# Patient Record
Sex: Female | Born: 1955 | Race: Black or African American | Hispanic: No | Marital: Single | Smoking: Current every day smoker
Health system: Southern US, Community
[De-identification: ages and names within clinical notes are randomized; demographics above are authoritative.]

## PROBLEM LIST (undated history)

## (undated) DIAGNOSIS — F419 Anxiety disorder, unspecified: Secondary | ICD-10-CM

## (undated) DIAGNOSIS — K219 Gastro-esophageal reflux disease without esophagitis: Secondary | ICD-10-CM

## (undated) DIAGNOSIS — E785 Hyperlipidemia, unspecified: Secondary | ICD-10-CM

## (undated) DIAGNOSIS — K635 Polyp of colon: Secondary | ICD-10-CM

## (undated) DIAGNOSIS — H269 Unspecified cataract: Secondary | ICD-10-CM

## (undated) DIAGNOSIS — E119 Type 2 diabetes mellitus without complications: Secondary | ICD-10-CM

## (undated) DIAGNOSIS — I10 Essential (primary) hypertension: Secondary | ICD-10-CM

## (undated) DIAGNOSIS — F32A Depression, unspecified: Secondary | ICD-10-CM

## (undated) DIAGNOSIS — M1990 Unspecified osteoarthritis, unspecified site: Secondary | ICD-10-CM

## (undated) DIAGNOSIS — B1920 Unspecified viral hepatitis C without hepatic coma: Secondary | ICD-10-CM

## (undated) DIAGNOSIS — F329 Major depressive disorder, single episode, unspecified: Secondary | ICD-10-CM

## (undated) DIAGNOSIS — G629 Polyneuropathy, unspecified: Secondary | ICD-10-CM

## (undated) HISTORY — DX: Gastro-esophageal reflux disease without esophagitis: K21.9

## (undated) HISTORY — DX: Type 2 diabetes mellitus without complications: E11.9

## (undated) HISTORY — DX: Unspecified cataract: H26.9

## (undated) HISTORY — DX: Polyneuropathy, unspecified: G62.9

## (undated) HISTORY — DX: Depression, unspecified: F32.A

## (undated) HISTORY — DX: Polyp of colon: K63.5

## (undated) HISTORY — DX: Hyperlipidemia, unspecified: E78.5

## (undated) HISTORY — DX: Major depressive disorder, single episode, unspecified: F32.9

## (undated) HISTORY — PX: COLONOSCOPY: SHX174

---

## 1999-12-14 HISTORY — PX: PARTIAL HYSTERECTOMY: SHX80

## 2010-05-13 LAB — HM COLONOSCOPY: HM COLON: NORMAL

## 2012-06-14 ENCOUNTER — Encounter (HOSPITAL_COMMUNITY): Payer: Self-pay | Admitting: Emergency Medicine

## 2012-06-14 ENCOUNTER — Emergency Department (HOSPITAL_COMMUNITY)
Admission: EM | Admit: 2012-06-14 | Discharge: 2012-06-14 | Disposition: A | Discharge status: Home / Self Care | Payer: Medicaid Other | Attending: Emergency Medicine | Admitting: Emergency Medicine

## 2012-06-14 DIAGNOSIS — F17200 Nicotine dependence, unspecified, uncomplicated: Secondary | ICD-10-CM | POA: Insufficient documentation

## 2012-06-14 DIAGNOSIS — H6690 Otitis media, unspecified, unspecified ear: Secondary | ICD-10-CM

## 2012-06-14 DIAGNOSIS — B1920 Unspecified viral hepatitis C without hepatic coma: Secondary | ICD-10-CM | POA: Insufficient documentation

## 2012-06-14 DIAGNOSIS — Z79899 Other long term (current) drug therapy: Secondary | ICD-10-CM | POA: Insufficient documentation

## 2012-06-14 DIAGNOSIS — R070 Pain in throat: Secondary | ICD-10-CM | POA: Insufficient documentation

## 2012-06-14 DIAGNOSIS — I10 Essential (primary) hypertension: Secondary | ICD-10-CM | POA: Insufficient documentation

## 2012-06-14 HISTORY — DX: Essential (primary) hypertension: I10

## 2012-06-14 HISTORY — DX: Unspecified viral hepatitis C without hepatic coma: B19.20

## 2012-06-14 MED ORDER — IBUPROFEN 400 MG PO TABS
800.0000 mg | ORAL_TABLET | Freq: Once | ORAL | Status: AC
  Administered 2012-06-14: 800 mg via ORAL
  Filled 2012-06-14: qty 2

## 2012-06-14 MED ORDER — AMOXICILLIN 500 MG PO CAPS: 500.0000 mg | ORAL_CAPSULE | Freq: Three times a day (TID) | ORAL | Status: AC

## 2012-06-14 MED ORDER — IBUPROFEN 800 MG PO TABS: 800.0000 mg | ORAL_TABLET | Freq: Three times a day (TID) | ORAL | Status: AC

## 2012-06-14 NOTE — ED Notes (Signed)
Pt c/o swollen painful in gland area to left side of neck starting today

## 2012-06-14 NOTE — ED Provider Notes (Signed)
History     CSN: [id number]  Arrival date & time 06/14/12  1807   None     Chief Complaint  Patient presents with  . Lymphadenopathy    (Consider location/radiation/quality/duration/timing/severity/associated sxs/prior treatment) Patient is a 56 y.o. female presenting with pharyngitis. The history is provided by the patient. No language interpreter was used.  Sore Throat This is a new problem. The current episode started today. The problem occurs constantly. The problem has been gradually worsening. Associated symptoms include headaches and a sore throat. Pertinent negatives include no congestion, fever, nausea, neck pain, swollen glands or vomiting. Associated symptoms comments: L ear pain. The symptoms are aggravated by swallowing. She has tried nothing for the symptoms.  c/o L neck pain with swallowing x few hours. Woke with  L ear pain as well. No fever n/v.  pmh hep c and hypertension.  No facial swelling.  Past Medical History  Diagnosis Date  . Hypertension   . Hepatitis C     History reviewed. No pertinent past surgical history.  History reviewed. No pertinent family history.  History  Substance Use Topics  . Smoking status: Current Everyday Smoker  . Smokeless tobacco: Not on file  . Alcohol Use: No    OB History    Grav Para Term Preterm Abortions TAB SAB Ect Mult Living                  Review of Systems  Constitutional: Negative.  Negative for fever.  HENT: Positive for ear pain, sore throat and facial swelling. Negative for congestion, rhinorrhea, neck pain, neck stiffness, postnasal drip, tinnitus and ear discharge.   Eyes: Negative.   Respiratory: Negative.   Cardiovascular: Negative.   Gastrointestinal: Negative.  Negative for nausea and vomiting.  Neurological: Positive for headaches. Negative for dizziness.  Psychiatric/Behavioral: Negative.   All other systems reviewed and are negative.    Allergies  Review of patient's allergies indicates no  known allergies.  Home Medications   Current Outpatient Rx  Name Route Sig Dispense Refill  . AMLODIPINE BESYLATE 10 MG PO TABS Oral Take 10 mg by mouth daily.      BP 147/92  Pulse 94  Temp 98.8 F (37.1 C) (Oral)  Resp 22  SpO2 100%  Physical Exam  Nursing note and vitals reviewed. Constitutional: She is oriented to person, place, and time. She appears well-developed and well-nourished.  HENT:  Head: Normocephalic and atraumatic.  Right Ear: Tympanic membrane normal.  Left Ear: There is swelling and tenderness. Tympanic membrane is injected and erythematous.  Nose: Nose normal.  Mouth/Throat: Uvula is midline and mucous membranes are normal. Posterior oropharyngeal erythema present. No oropharyngeal exudate, posterior oropharyngeal edema or tonsillar abscesses.  Eyes: Conjunctivae and EOM are normal. Pupils are equal, round, and reactive to light.  Neck: Normal range of motion. Neck supple.  Cardiovascular: Normal rate.   Pulmonary/Chest: Effort normal.  Abdominal: Soft.  Musculoskeletal: Normal range of motion. She exhibits no edema and no tenderness.  Neurological: She is alert and oriented to person, place, and time. She has normal reflexes.  Skin: Skin is warm and dry.  Psychiatric: She has a normal mood and affect.    ED Course  Procedures (including critical care time)   Labs Reviewed  RAPID STREP SCREEN   No results found.   No diagnosis found.    MDM  L otitis media.  - strep.  rx for amoxicillin and ibuprofen.  Follow up with pcp of choice.  Return if worse.        Julieta Bellini, NP 06/15/12 [phone]

## 2012-06-19 NOTE — ED Provider Notes (Signed)
Medical screening examination/treatment/procedure(s) were performed by non-physician practitioner and as supervising physician I was immediately available for consultation/collaboration.  Veryl Speak, MD 06/19/12 [phone]

## 2012-07-13 ENCOUNTER — Emergency Department (HOSPITAL_COMMUNITY)
Admission: EM | Admit: 2012-07-13 | Discharge: 2012-07-13 | Disposition: A | Discharge status: Home / Self Care | Payer: Medicaid Other | Attending: Emergency Medicine | Admitting: Emergency Medicine

## 2012-07-13 ENCOUNTER — Encounter (HOSPITAL_COMMUNITY): Payer: Self-pay | Admitting: Emergency Medicine

## 2012-07-13 DIAGNOSIS — B1920 Unspecified viral hepatitis C without hepatic coma: Secondary | ICD-10-CM | POA: Insufficient documentation

## 2012-07-13 DIAGNOSIS — Y93I9 Activity, other involving external motion: Secondary | ICD-10-CM | POA: Insufficient documentation

## 2012-07-13 DIAGNOSIS — Y998 Other external cause status: Secondary | ICD-10-CM | POA: Insufficient documentation

## 2012-07-13 DIAGNOSIS — S239XXA Sprain of unspecified parts of thorax, initial encounter: Secondary | ICD-10-CM | POA: Insufficient documentation

## 2012-07-13 DIAGNOSIS — Y9289 Other specified places as the place of occurrence of the external cause: Secondary | ICD-10-CM | POA: Insufficient documentation

## 2012-07-13 DIAGNOSIS — F17200 Nicotine dependence, unspecified, uncomplicated: Secondary | ICD-10-CM | POA: Insufficient documentation

## 2012-07-13 DIAGNOSIS — M129 Arthropathy, unspecified: Secondary | ICD-10-CM | POA: Insufficient documentation

## 2012-07-13 DIAGNOSIS — T148XXA Other injury of unspecified body region, initial encounter: Secondary | ICD-10-CM

## 2012-07-13 DIAGNOSIS — I10 Essential (primary) hypertension: Secondary | ICD-10-CM | POA: Insufficient documentation

## 2012-07-13 HISTORY — DX: Unspecified osteoarthritis, unspecified site: M19.90

## 2012-07-13 MED ORDER — DIAZEPAM 5 MG PO TABS: 5.0000 mg | ORAL_TABLET | Freq: Four times a day (QID) | ORAL | Status: AC | PRN

## 2012-07-13 MED ORDER — NAPROXEN 500 MG PO TABS: 500.0000 mg | ORAL_TABLET | Freq: Two times a day (BID) | ORAL | Status: AC

## 2012-07-13 MED ORDER — DIAZEPAM 5 MG PO TABS
5.0000 mg | ORAL_TABLET | Freq: Once | ORAL | Status: AC
  Administered 2012-07-13: 5 mg via ORAL
  Filled 2012-07-13: qty 1
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
## 2012-07-13 NOTE — ED Provider Notes (Signed)
Medical screening examination/treatment/procedure(s) were performed by non-physician practitioner and as supervising physician I was immediately available for consultation/collaboration.   Julianne Rice, MD 07/13/12 [phone]
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
## 2014-03-01 ENCOUNTER — Encounter (HOSPITAL_COMMUNITY): Payer: Self-pay | Admitting: Emergency Medicine

## 2014-03-01 ENCOUNTER — Emergency Department (HOSPITAL_COMMUNITY)
Admission: EM | Admit: 2014-03-01 | Discharge: 2014-03-01 | Disposition: A | Discharge status: Home / Self Care | Payer: Medicaid Other | Attending: Emergency Medicine | Admitting: Emergency Medicine

## 2014-03-01 DIAGNOSIS — Z79899 Other long term (current) drug therapy: Secondary | ICD-10-CM | POA: Insufficient documentation

## 2014-03-01 DIAGNOSIS — I10 Essential (primary) hypertension: Secondary | ICD-10-CM | POA: Insufficient documentation

## 2014-03-01 DIAGNOSIS — M5430 Sciatica, unspecified side: Secondary | ICD-10-CM | POA: Insufficient documentation

## 2014-03-01 DIAGNOSIS — Z8659 Personal history of other mental and behavioral disorders: Secondary | ICD-10-CM | POA: Insufficient documentation

## 2014-03-01 DIAGNOSIS — F17200 Nicotine dependence, unspecified, uncomplicated: Secondary | ICD-10-CM | POA: Insufficient documentation

## 2014-03-01 DIAGNOSIS — Z8619 Personal history of other infectious and parasitic diseases: Secondary | ICD-10-CM | POA: Insufficient documentation

## 2014-03-01 DIAGNOSIS — M129 Arthropathy, unspecified: Secondary | ICD-10-CM | POA: Insufficient documentation

## 2014-03-01 DIAGNOSIS — M5431 Sciatica, right side: Secondary | ICD-10-CM

## 2014-03-01 HISTORY — DX: Anxiety disorder, unspecified: F41.9

## 2014-03-01 MED ORDER — DIAZEPAM 5 MG PO TABS
5.0000 mg | ORAL_TABLET | Freq: Once | ORAL | Status: AC
  Administered 2014-03-01: 5 mg via ORAL
  Filled 2014-03-01: qty 1

## 2014-03-01 MED ORDER — OXYCODONE-ACETAMINOPHEN 5-325 MG PO TABS: 1.0000 | ORAL_TABLET | Freq: Four times a day (QID) | ORAL | Status: DC | PRN

## 2014-03-01 MED ORDER — KETOROLAC TROMETHAMINE 60 MG/2ML IM SOLN
60.0000 mg | Freq: Once | INTRAMUSCULAR | Status: AC
  Administered 2014-03-01: 60 mg via INTRAMUSCULAR
  Filled 2014-03-01: qty 2

## 2014-03-01 MED ORDER — OXYCODONE-ACETAMINOPHEN 5-325 MG PO TABS
1.0000 | ORAL_TABLET | Freq: Once | ORAL | Status: AC
  Administered 2014-03-01: 1 via ORAL
  Filled 2014-03-01: qty 1

## 2014-03-01 MED ORDER — PREDNISONE 10 MG PO TABS: 20.0000 mg | ORAL_TABLET | Freq: Every day | ORAL | Status: DC

## 2014-03-01 MED ORDER — ONDANSETRON 4 MG PO TBDP
4.0000 mg | ORAL_TABLET | Freq: Once | ORAL | Status: AC
  Administered 2014-03-01: 4 mg via ORAL
  Filled 2014-03-01: qty 1

## 2014-03-01 MED ORDER — CYCLOBENZAPRINE HCL 10 MG PO TABS: 10.0000 mg | ORAL_TABLET | Freq: Two times a day (BID) | ORAL | Status: DC | PRN

## 2014-03-01 NOTE — ED Notes (Signed)
Patient give [place] sandwich and a bus pass per request.

## 2014-03-01 NOTE — ED Notes (Signed)
Pt c/o right mid and lower back pain x 3 days. States feels "like a cramp". Has had this in the past.

## 2014-03-01 NOTE — ED Provider Notes (Signed)
CSN: [id number]     Arrival date & time 03/01/14  1250 History  This chart was scribed for non-physician practitioner Linus Mako, PA-C working with Ezequiel Essex, MD by Ludger Nutting, ED Scribe. This patient was seen in room TR07C/TR07C and the patient's care was started at 2:01 PM.    Chief Complaint  Patient presents with  . Back Pain      The history is provided by the patient. No language interpreter was used.    HPI Comments: Mary Stephens is a 58 y.o. female brought in by ambulance, who presents to the Emergency Department complaining of constant, unchanged right lower back pain that radiates to the left lower back. She states the pain occasionally radiates to the left lower extremity. She reports the pain is worse when changing positions between sitting and standing. She reports similar symptoms to the back in the past. She denies change in bowel or bladder function, numbness.   Past Medical History  Diagnosis Date  . Hypertension   . Hepatitis C   . Arthritis   . Anxiety    History reviewed. No pertinent past surgical history. History reviewed. No pertinent family history. History  Substance Use Topics  . Smoking status: Current Every Day Smoker  . Smokeless tobacco: Not on file  . Alcohol Use: No   OB History   Grav Para Term Preterm Abortions TAB SAB Ect Mult Living                 Review of Systems  Constitutional: Negative for fever.  Musculoskeletal: Positive for back pain. Negative for gait problem.  Neurological: Negative for numbness.      Allergies  Review of patient's allergies indicates no known allergies.  Home Medications   Current Outpatient Rx  Name  Route  Sig  Dispense  Refill  . amLODipine (NORVASC) 10 MG tablet   Oral   Take 10 mg by mouth daily.         . hydrochlorothiazide (MICROZIDE) 12.5 MG capsule   Oral   Take 12.5 mg by mouth daily.         . cyclobenzaprine (FLEXERIL) 10 MG tablet   Oral   Take 1 tablet (10 mg  total) by mouth 2 (two) times daily as needed for muscle spasms.   20 tablet   0   . oxyCODONE-acetaminophen (PERCOCET/ROXICET) 5-325 MG per tablet   Oral   Take 1 tablet by mouth every 6 (six) hours as needed for severe pain.   15 tablet   0   . predniSONE (DELTASONE) 10 MG tablet   Oral   Take 2 tablets (20 mg total) by mouth daily.   21 tablet   0     Prednisone dose pack directions:   6 tabs on day ...    BP 124/91  Pulse 77  Temp(Src) 98.6 F (37 C) (Oral)  Resp 16  SpO2 96% Physical Exam  Nursing note and vitals reviewed. Constitutional: She is oriented to person, place, and time. She appears well-developed and well-nourished.  HENT:  Stephens: Normocephalic and atraumatic.  Cardiovascular: Normal rate.   Pulmonary/Chest: Effort normal.  Abdominal: She exhibits no distension.  Musculoskeletal:       Back:   Equal strength to bilateral lower extremities. Neurosensory function adequate to both legs. Skin color is normal. Skin is warm and moist. I see no step off deformity, no bony tenderness. Pt is able to ambulate without limp. Pain is relieved when sitting  in certain positions. ROM is decreased due to pain. No crepitus, laceration, effusion, swelling.  Pulses are normal   Neurological: She is alert and oriented to person, place, and time.  Skin: Skin is warm and dry.  Psychiatric: She has a normal mood and affect.    ED Course  Procedures (including critical care time)  DIAGNOSTIC STUDIES: Oxygen Saturation is 95% on RA, adequate by my interpretation.    COORDINATION OF CARE: 2:04 PM She has been given Valium, percocet, and zofran in the ED. Will wait to see if her pain will resolved with this medication. Discussed treatment plan with pt at bedside and pt agreed to plan.  Pt also given IM Toradol. Pain improved greatly. Able to walk and still has some pain with movement.   Labs Review Labs Reviewed - No data to display Imaging Review No results found.    EKG Interpretation None      MDM   Final diagnoses:  Sciatica of right side    64 y.o.Mary Stephens  with back pain. No neurological deficits and normal neuro exam. Patient can walk but states is painful. No loss of bowel or bladder control. No concern for cauda equina. No fever, night sweats, weight loss, h/o cancer, IVDU. RICE protocol and pain medicine indicated and discussed with patient.   Patient Plan 1. Medications: narcotic pain medication, muscle relaxer and usual home medications  2. Treatment: rest, drink plenty of fluids, gentle stretching as discussed, alternate ice and heat  3. Follow Up: Please followup with your primary doctor for discussion of your diagnoses and further evaluation after today's visit; if you do not have a primary care doctor use the resource guide provided to find one   Vital signs are stable at discharge. Filed Vitals:   03/01/14 1523  BP: 124/91  Pulse: 77  Temp: 98.6 F (37 C)  Resp: 16    Patient/guardian has voiced understanding and agreed to follow-up with the PCP or specialist.     I personally performed the services described in this documentation, which was scribed in my presence. The recorded information has been reviewed and is accurate.   Linus Mako, PA-C 03/01/14 1559

## 2014-03-01 NOTE — Discharge Instructions (Signed)
Sciatica °Sciatica is pain, weakness, numbness, or tingling along the path of the sciatic nerve. The nerve starts in the lower back and runs down the back of each leg. The nerve controls the muscles in the lower leg and in the back of the knee, while also providing sensation to the back of the thigh, lower leg, and the sole of your foot. Sciatica is a symptom of another medical condition. For instance, nerve damage or certain conditions, such as a herniated disk or bone spur on the spine, pinch or put pressure on the sciatic nerve. This causes the pain, weakness, or other sensations normally associated with sciatica. Generally, sciatica only affects one side of the body. °CAUSES  °· Herniated or slipped disc. °· Degenerative disk disease. °· A pain disorder involving the narrow muscle in the buttocks (piriformis syndrome). °· Pelvic injury or fracture. °· Pregnancy. °· Tumor (rare). °SYMPTOMS  °Symptoms can vary from mild to very severe. The symptoms usually travel from the low back to the buttocks and down the back of the leg. Symptoms can include: °· Mild tingling or dull aches in the lower back, leg, or hip. °· Numbness in the back of the calf or sole of the foot. °· Burning sensations in the lower back, leg, or hip. °· Sharp pains in the lower back, leg, or hip. °· Leg weakness. °· Severe back pain inhibiting movement. °These symptoms may get worse with coughing, sneezing, laughing, or prolonged sitting or standing. Also, being overweight may worsen symptoms. °DIAGNOSIS  °Your caregiver will perform a physical exam to look for common symptoms of sciatica. He or she may ask you to do certain movements or activities that would trigger sciatic nerve pain. Other tests may be performed to find the cause of the sciatica. These may include: °· Blood tests. °· X-rays. °· Imaging tests, such as an MRI or CT scan. °TREATMENT  °Treatment is directed at the cause of the sciatic pain. Sometimes, treatment is not necessary  and the pain and discomfort goes away on its own. If treatment is needed, your caregiver may suggest: °· Over-the-counter medicines to relieve pain. °· Prescription medicines, such as anti-inflammatory medicine, muscle relaxants, or narcotics. °· Applying heat or ice to the painful area. °· Steroid injections to lessen pain, irritation, and inflammation around the nerve. °· Reducing activity during periods of pain. °· Exercising and stretching to strengthen your abdomen and improve flexibility of your spine. Your caregiver may suggest losing weight if the extra weight makes the back pain worse. °· Physical therapy. °· Surgery to eliminate what is pressing or pinching the nerve, such as a bone spur or part of a herniated disk. °HOME CARE INSTRUCTIONS  °· Only take over-the-counter or prescription medicines for pain or discomfort as directed by your caregiver. °· Apply ice to the affected area for 20 minutes, 3 4 times a day for the first 48 72 hours. Then try heat in the same way. °· Exercise, stretch, or perform your usual activities if these do not aggravate your pain. °· Attend physical therapy sessions as directed by your caregiver. °· Keep all follow-up appointments as directed by your caregiver. °· Do not wear high heels or shoes that do not provide proper support. °· Check your mattress to see if it is too soft. A firm mattress may lessen your pain and discomfort. °SEEK IMMEDIATE MEDICAL CARE IF:  °· You lose control of your bowel or bladder (incontinence). °· You have increasing weakness in the lower back,   pelvis, buttocks, or legs. °· You have redness or swelling of your back. °· You have a burning sensation when you urinate. °· You have pain that gets worse when you lie down or awakens you at night. °· Your pain is worse than you have experienced in the past. °· Your pain is lasting longer than 4 weeks. °· You are suddenly losing weight without reason. °MAKE SURE YOU: °· Understand these  instructions. °· Will watch your condition. °· Will get help right away if you are not doing well or get worse. °Document Released: 11/23/2001 Document Revised: 05/30/2012 Document Reviewed: 04/09/2012 °[employer]® Patient Information ©2014 [employer]. ° °

## 2014-03-01 NOTE — ED Notes (Signed)
Per ems: pt called today for back pain. Ambulatory on scene. Describes as muscle spasms. Lower R back. 7/10. VSS

## 2014-03-01 NOTE — ED Provider Notes (Signed)
Medical screening examination/treatment/procedure(s) were performed by non-physician practitioner and as supervising physician I was immediately available for consultation/collaboration.   EKG Interpretation None        Ezequiel Essex, MD 03/01/14 [phone]

## 2014-06-28 ENCOUNTER — Encounter: Payer: Self-pay | Admitting: Internal Medicine

## 2014-06-28 ENCOUNTER — Ambulatory Visit: Payer: Medicaid Other | Attending: Internal Medicine | Admitting: Internal Medicine

## 2014-06-28 VITALS — BP 139/90 | HR 88 | Temp 98.1°F | Resp 18 | Ht 68.0 in | Wt 170.0 lb

## 2014-06-28 DIAGNOSIS — G589 Mononeuropathy, unspecified: Secondary | ICD-10-CM | POA: Diagnosis not present

## 2014-06-28 DIAGNOSIS — F17200 Nicotine dependence, unspecified, uncomplicated: Secondary | ICD-10-CM | POA: Diagnosis not present

## 2014-06-28 DIAGNOSIS — R51 Headache: Secondary | ICD-10-CM | POA: Diagnosis not present

## 2014-06-28 DIAGNOSIS — Z7189 Other specified counseling: Secondary | ICD-10-CM | POA: Diagnosis not present

## 2014-06-28 DIAGNOSIS — Z0000 Encounter for general adult medical examination without abnormal findings: Secondary | ICD-10-CM | POA: Diagnosis not present

## 2014-06-28 DIAGNOSIS — Z7689 Persons encountering health services in other specified circumstances: Secondary | ICD-10-CM

## 2014-06-28 DIAGNOSIS — E119 Type 2 diabetes mellitus without complications: Secondary | ICD-10-CM

## 2014-06-28 DIAGNOSIS — G629 Polyneuropathy, unspecified: Secondary | ICD-10-CM

## 2014-06-28 DIAGNOSIS — R519 Headache, unspecified: Secondary | ICD-10-CM

## 2014-06-28 DIAGNOSIS — B1920 Unspecified viral hepatitis C without hepatic coma: Secondary | ICD-10-CM | POA: Diagnosis not present

## 2014-06-28 LAB — CBC
HCT: 42.6 % (ref 36.0–46.0)
Hemoglobin: 15 g/dL (ref 12.0–15.0)
MCH: 33.7 pg (ref 26.0–34.0)
MCHC: 35.2 g/dL (ref 30.0–36.0)
MCV: 95.7 fL (ref 78.0–100.0)
PLATELETS: 206 10*3/uL (ref 150–400)
RBC: 4.45 MIL/uL (ref 3.87–5.11)
RDW: 12.7 % (ref 11.5–15.5)
WBC: 7.4 10*3/uL (ref 4.0–10.5)

## 2014-06-28 LAB — POCT GLYCOSYLATED HEMOGLOBIN (HGB A1C): HEMOGLOBIN A1C: 6.8

## 2014-06-28 MED ORDER — GABAPENTIN 300 MG PO CAPS: 300.0000 mg | ORAL_CAPSULE | Freq: Two times a day (BID) | ORAL | Status: DC

## 2014-06-28 MED ORDER — METFORMIN HCL 500 MG PO TABS: 500.0000 mg | ORAL_TABLET | Freq: Two times a day (BID) | ORAL | Status: DC

## 2014-06-28 NOTE — Progress Notes (Signed)
Patient ID: Mary Stephens, female   DOB: [birth year], 58 y.o.   MRN: [id number]  [id number]  [id number]  DOB - 25-Nov-1956  CC:  Chief Complaint  Patient presents with  . Establish Care  . Leg Pain       HPI: Mary Stephens is a 58 y.o. female here today to establish medical care.  Patient reports that she has been having pain in her bilateral extremeties for the past few "months". Patient reports the pain begins in lower part of her legs and it radiates down to her feet.  She states that she started getting the same sensation of pens and needles in her right hand this morning.  Patient reports that she has never been evaluated for diabetes.  She states that she had a bad pain in her upper back on the right side that lingered all night. 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  Patient c/o of severe headaches a few times per month that are severe sharp pain that suddenly hits her in the head.  The headaches starts on the top of her head and move to the back.     No Known Allergies Past Medical History  Diagnosis Date  . Hypertension   . Hepatitis C   . Arthritis   . Anxiety    Current Outpatient Prescriptions on File Prior to Visit  Medication Sig Dispense Refill  . amLODipine (NORVASC) 10 MG tablet Take 10 mg by mouth daily.      . cyclobenzaprine (FLEXERIL) 10 MG tablet Take 1 tablet (10 mg total) by mouth 2 (two) times daily as needed for muscle spasms.  20 tablet  0  . hydrochlorothiazide (MICROZIDE) 12.5 MG capsule Take 12.5 mg by mouth daily.      Marland Kitchen oxyCODONE-acetaminophen (PERCOCET/ROXICET) 5-325 MG per tablet Take 1 tablet by mouth every 6 (six) hours as needed for severe pain.  15 tablet  0  . predniSONE (DELTASONE) 10 MG tablet Take 2 tablets (20 mg total) by mouth daily.  21 tablet  0   No current facility-administered medications on file prior to visit.   History reviewed. No pertinent family  history. History   Social History  . Marital Status: Single    Spouse Name: N/A    Number of Children: N/A  . Years of Education: N/A   Occupational History  . Not on file.   Social History Main Topics  . Smoking status: Current Every Day Smoker  . Smokeless tobacco: Not on file     Comment: admits to cuttting back 4 cigarettes/day  . Alcohol Use: No  . Drug Use: No  . Sexual Activity: Not on file   Other Topics Concern  . Not on file   Social History Narrative  . No narrative on file   Review of Systems  Eyes: Positive for blurred vision (has small cataracts).  Neurological: Positive for headaches.      Objective:   Filed Vitals:   06/28/14 1655  BP: 139/90  Pulse: 88  Temp: 98.1 F (36.7 C)  Resp: 18    Physical Exam: Constitutional: Patient appears well-developed and well-nourished. No distress. HENT: Normocephalic, atraumatic, External right and left ear normal. Oropharynx is clear and moist.  Eyes: Conjunctivae and EOM are normal. PERRLA, no scleral icterus. Neck: Normal ROM. Neck supple. No JVD. No tracheal deviation. No thyromegaly. CVS: RRR, S1/S2 +, no murmurs, no gallops, no carotid bruit.  Pulmonary: Effort and breath sounds normal, no stridor, rhonchi, wheezes, rales.  Abdominal: Soft. BS +, no distension, tenderness, rebound or guarding.  Musculoskeletal: Normal range of motion. No edema and no tenderness.  Lymphadenopathy: No lymphadenopathy noted, cervical, inguinal or axillary Neuro: Alert. Normal reflexes, muscle tone coordination. No cranial nerve deficit. Skin: Skin is warm and dry. No rash noted. Not diaphoretic. No erythema. No pallor. Psychiatric: Normal mood and affect. Behavior, judgment, thought content normal.  No results found for this basename: WBC, HGB, HCT, MCV, PLT   No results found for this basename: CREATININE, BUN, NA, K, CL, CO2    No results found for this basename: HGBA1C   Lipid Panel  No results found for this  basename: chol, trig, hdl, cholhdl, vldl, ldlcalc       Assessment and plan:   Mary Stephens was seen today for establish care and leg pain.  Diagnoses and associated orders for this visit:  Type II or unspecified type diabetes mellitus without mention of complication, not stated as uncontrolled - Ambulatory referral to Nutrition and Diabetic Education - gabapentin (NEURONTIN) 300 MG capsule; Take 1 capsule (300 mg total) by mouth 2 (two) times daily. - metFORMIN (GLUCOPHAGE) 500 MG tablet; Take 1 tablet (500 mg total) by mouth 2 (two) times daily with a meal. - Ambulatory referral to Ophthalmology  Neuropathy - POCT glucose (manual entry) - Hemoglobin A1c - POCT glycosylated hemoglobin (Hb A1C)  New onset of headaches - CT Head Wo Contrast; Future  Hepatitis C virus infection without hepatic coma, unspecified chronicity - Ambulatory referral to Infectious Disease  Encounter to establish care - CBC - COMPLETE METABOLIC PANEL WITH GFR - TSH - Vitamin D, 25-hydroxy - Lipid panel; Future     Chari Manning, NP-C [facility] [phone] 07/07/2014, 10:25 PM

## 2014-06-28 NOTE — Progress Notes (Signed)
Pt here to establish care for c/o bilateral leg pain radiating to both feet. States the pain is intermit pin/needles x 6 mnths Pt also states she noticed tingling in both hands Taking prescribed medications daily

## 2014-06-28 NOTE — Patient Instructions (Signed)
Take Metformin for diabetes Take gabapentin for nerve pain in feet

## 2014-06-29 LAB — HEMOGLOBIN A1C
HEMOGLOBIN A1C: 7.1 % — AB (ref <5.7)
MEAN PLASMA GLUCOSE: 157 mg/dL — AB (ref <117)

## 2014-06-29 LAB — COMPLETE METABOLIC PANEL WITH GFR
ALT: 80 U/L — AB (ref 0–35)
AST: 64 U/L — AB (ref 0–37)
Albumin: 4 g/dL (ref 3.5–5.2)
Alkaline Phosphatase: 83 U/L (ref 39–117)
BUN: 25 mg/dL — ABNORMAL HIGH (ref 6–23)
CALCIUM: 9.1 mg/dL (ref 8.4–10.5)
CHLORIDE: 103 meq/L (ref 96–112)
CO2: 26 meq/L (ref 19–32)
Creat: 1.35 mg/dL — ABNORMAL HIGH (ref 0.50–1.10)
GFR, EST AFRICAN AMERICAN: 49 mL/min — AB
GFR, Est Non African American: 43 mL/min — ABNORMAL LOW
Glucose, Bld: 130 mg/dL — ABNORMAL HIGH (ref 70–99)
Potassium: 3.2 mEq/L — ABNORMAL LOW (ref 3.5–5.3)
SODIUM: 138 meq/L (ref 135–145)
TOTAL PROTEIN: 8.1 g/dL (ref 6.0–8.3)
Total Bilirubin: 0.5 mg/dL (ref 0.2–1.2)

## 2014-06-29 LAB — TSH: TSH: 2.11 u[IU]/mL (ref 0.350–4.500)

## 2014-06-29 LAB — VITAMIN D 25 HYDROXY (VIT D DEFICIENCY, FRACTURES): VIT D 25 HYDROXY: 16 ng/mL — AB (ref 30–89)

## 2014-07-01 ENCOUNTER — Ambulatory Visit: Payer: Medicaid Other | Attending: Internal Medicine

## 2014-07-01 DIAGNOSIS — Z7689 Persons encountering health services in other specified circumstances: Secondary | ICD-10-CM

## 2014-07-01 LAB — LIPID PANEL
CHOL/HDL RATIO: 4.4 ratio
Cholesterol: 171 mg/dL (ref 0–200)
HDL: 39 mg/dL — AB (ref >39)
LDL CALC: 100 mg/dL — AB (ref 0–99)
TRIGLYCERIDES: 162 mg/dL — AB (ref <150)
VLDL: 32 mg/dL (ref 0–40)

## 2014-07-02 ENCOUNTER — Telehealth: Payer: Self-pay | Admitting: *Deleted

## 2014-07-02 NOTE — Telephone Encounter (Signed)
Left message with patient's daughter to have patient return call to discuss results.

## 2014-07-02 NOTE — Telephone Encounter (Signed)
Message copied by Velora Heckler on Tue Jul 02, 2014  4:30 PM ------      Message from: Chari Manning A      Created: Tue Jul 02, 2014  1:49 PM       Triglycerides are slightly elevated. Please educate patient on diet specifics and exercise to help lower cholesterol. Thanks ------

## 2014-07-05 ENCOUNTER — Telehealth: Payer: Self-pay | Admitting: Emergency Medicine

## 2014-07-05 NOTE — Telephone Encounter (Signed)
Pt. Was returning call in regards to lab results

## 2014-07-05 NOTE — Telephone Encounter (Signed)
Pt given lab results with instructions on low fat diet with exercise to lower cholesterol

## 2014-07-08 ENCOUNTER — Other Ambulatory Visit: Payer: Medicaid Other

## 2014-07-08 DIAGNOSIS — B182 Chronic viral hepatitis C: Secondary | ICD-10-CM

## 2014-07-08 LAB — PROTIME-INR
INR: 1 (ref <1.50)
PROTHROMBIN TIME: 13.2 s (ref 11.6–15.2)

## 2014-07-08 LAB — IRON: Iron: 108 ug/dL (ref 42–145)

## 2014-07-09 LAB — HEPATITIS B SURFACE ANTIGEN: HEP B S AG: NEGATIVE

## 2014-07-09 LAB — HEPATITIS B CORE ANTIBODY, TOTAL: Hep B Core Total Ab: NONREACTIVE

## 2014-07-09 LAB — HEPATITIS B SURFACE ANTIBODY,QUALITATIVE: Hep B S Ab: NEGATIVE

## 2014-07-09 LAB — HIV ANTIBODY (ROUTINE TESTING W REFLEX): HIV 1&2 Ab, 4th Generation: NONREACTIVE

## 2014-07-09 LAB — ANA: ANA: NEGATIVE

## 2014-07-09 LAB — HEPATITIS A ANTIBODY, TOTAL: HEP A TOTAL AB: REACTIVE — AB

## 2014-07-10 LAB — HEPATITIS C RNA QUANTITATIVE
HCV QUANT LOG: 6.3 {Log} — AB (ref <1.18)
HCV Quantitative: 2004688 IU/mL — ABNORMAL HIGH (ref <15)

## 2014-07-11 LAB — HEPATITIS C GENOTYPE

## 2014-07-16 ENCOUNTER — Ambulatory Visit (INDEPENDENT_AMBULATORY_CARE_PROVIDER_SITE_OTHER): Payer: Medicaid Other | Admitting: Home Health Services

## 2014-07-16 DIAGNOSIS — E119 Type 2 diabetes mellitus without complications: Secondary | ICD-10-CM

## 2014-07-16 NOTE — Progress Notes (Signed)
DIABETES Pt came in to have a retinal scan per diabetic care.   Image was taken and submitted to [facility]-DR. [name] for reading.    Results will be available in 1-2 weeks.  Results will be given to PCP for review and to contact patient.  Mary Stephens

## 2014-07-18 ENCOUNTER — Ambulatory Visit: Payer: Medicaid Other

## 2014-07-25 ENCOUNTER — Ambulatory Visit: Payer: Medicaid Other

## 2014-08-01 ENCOUNTER — Encounter: Payer: Medicaid Other | Attending: Internal Medicine

## 2014-08-01 ENCOUNTER — Ambulatory Visit: Payer: Medicaid Other

## 2014-08-03 ENCOUNTER — Emergency Department (HOSPITAL_COMMUNITY): Payer: Medicaid Other

## 2014-08-03 ENCOUNTER — Emergency Department (HOSPITAL_COMMUNITY)
Admission: EM | Admit: 2014-08-03 | Discharge: 2014-08-03 | Disposition: A | Discharge status: Home / Self Care | Payer: Medicaid Other | Attending: Emergency Medicine | Admitting: Emergency Medicine

## 2014-08-03 ENCOUNTER — Encounter (HOSPITAL_COMMUNITY): Payer: Self-pay | Admitting: Emergency Medicine

## 2014-08-03 DIAGNOSIS — I10 Essential (primary) hypertension: Secondary | ICD-10-CM | POA: Diagnosis not present

## 2014-08-03 DIAGNOSIS — R519 Headache, unspecified: Secondary | ICD-10-CM

## 2014-08-03 DIAGNOSIS — F17200 Nicotine dependence, unspecified, uncomplicated: Secondary | ICD-10-CM | POA: Diagnosis not present

## 2014-08-03 DIAGNOSIS — R110 Nausea: Secondary | ICD-10-CM | POA: Diagnosis not present

## 2014-08-03 DIAGNOSIS — R52 Pain, unspecified: Secondary | ICD-10-CM | POA: Diagnosis not present

## 2014-08-03 DIAGNOSIS — IMO0002 Reserved for concepts with insufficient information to code with codable children: Secondary | ICD-10-CM | POA: Insufficient documentation

## 2014-08-03 DIAGNOSIS — F411 Generalized anxiety disorder: Secondary | ICD-10-CM | POA: Insufficient documentation

## 2014-08-03 DIAGNOSIS — Z8619 Personal history of other infectious and parasitic diseases: Secondary | ICD-10-CM | POA: Diagnosis not present

## 2014-08-03 DIAGNOSIS — M129 Arthropathy, unspecified: Secondary | ICD-10-CM | POA: Insufficient documentation

## 2014-08-03 DIAGNOSIS — Z79899 Other long term (current) drug therapy: Secondary | ICD-10-CM | POA: Diagnosis not present

## 2014-08-03 DIAGNOSIS — R51 Headache: Secondary | ICD-10-CM | POA: Insufficient documentation

## 2014-08-03 NOTE — ED Provider Notes (Signed)
Medical screening examination/treatment/procedure(s) were performed by non-physician practitioner and as supervising physician I was immediately available for consultation/collaboration.   EKG Interpretation None        Evelina Bucy, MD 08/03/14 2352

## 2014-08-03 NOTE — ED Notes (Signed)
Pt is requesting to speak with social worker, when asked pt confides in this RN that she needs bus pass to get back home and also sts that she didn't have anything to eat and that she has no food at home. Bus pass given to pt and will check with PA Josh if pt can have a sandwich.

## 2014-08-03 NOTE — Discharge Instructions (Signed)
Please read and follow all provided instructions.  Your diagnoses today include:  1. Acute nonintractable headache, unspecified headache type     Tests performed today include:  CT of your head which was normal and did not show any serious cause of your headache  Vital signs. See below for your results today.   Medications:  Take any prescribed medications only as directed.  Additional information:  Follow any educational materials contained in this packet.  You are having a headache. No specific cause was found today for your headache. It may have been a migraine or other cause of headache. Stress, anxiety, fatigue, and depression are common triggers for headaches.   Your headache today does not appear to be life-threatening or require hospitalization, but often the exact cause of headaches is not determined in the emergency department. Therefore, follow-up with your doctor is very important to find out what may have caused your headache and whether or not you need any further diagnostic testing or treatment.   Sometimes headaches can appear benign (not harmful), but then more serious symptoms can develop which should prompt an immediate re-evaluation by your doctor or the emergency department.  BE VERY CAREFUL not to take multiple medicines containing Tylenol (also called acetaminophen). Doing so can lead to an overdose which can damage your liver and cause liver failure and possibly death.   Follow-up instructions: Please follow-up with your primary care provider in the next 3 days for further evaluation of your symptoms.   Return instructions:   Please return to the Emergency Department if you experience worsening symptoms.  Return if the medications do not resolve your headache, if it recurs, or if you have multiple episodes of vomiting or cannot keep down fluids.  Return if you have a change from the usual headache.  RETURN IMMEDIATELY IF you:  Develop a sudden, severe  headache  Develop confusion or become poorly responsive or faint  Develop a fever above 100.19F or problem breathing  Have a change in speech, vision, swallowing, or understanding  Develop new weakness, numbness, tingling, incoordination in your arms or legs  Have a seizure  Please return if you have any other emergent concerns.  Additional Information:  Your vital signs today were: BP 138/98   Pulse 96   Temp(Src) 98.2 F (36.8 C) (Oral)   Resp 16   SpO2 95% If your blood pressure (BP) was elevated above 135/85 this visit, please have this repeated by your doctor within one month. --------------

## 2014-08-03 NOTE — ED Notes (Signed)
Pt is requesting bus pass at discharge

## 2014-08-03 NOTE — ED Notes (Signed)
Pt picked up from phone booth via EMS-Per EMS, pt states that she has a HA x6 months that moves to different location of her head. When fire arrived pt stated that she didn't want any _ucking firemen, only EMS. Pt states that she needs to talk to Education officer, museum. Pt states that she her family is going into her apartment when she leaves and they are"driving me crazy." Pt is A&O and in NAD

## 2014-08-03 NOTE — ED Notes (Signed)
Pt c/o headache x 6 months that moves around in her head.  Also states that she needs a Education officer, museum.  Refuses to say why because it is "personal".  Pt also does not want to speak any further to this writer because I am "just a nurse".

## 2014-08-03 NOTE — ED Provider Notes (Signed)
CSN: [id number]     Arrival date & time 08/03/14  1557 History   First MD Initiated Contact with Patient 08/03/14 1849     Chief Complaint  Patient presents with  . Headache     (Consider location/radiation/quality/duration/timing/severity/associated sxs/prior Treatment) HPI Comments: Patient presents with complaint of headaches that have been occuring since last year. They are gradual onset, occuring at various places on her head, that last approximately an hour. Pain is sharp in character. They occur about once every 2 days. She does not treat them with any medications or other treatments. She has mild photophobia with them and occasional nausea, but no vomiting. No vision change or hearing change. No blurry vision or loss of vision. No rhinorrhea or tearing from eyes. She states her neck is sometimes stiff but has not had fever or head injury. Per PCP note 1 month ago, patient mentioned headaches at that time. She had CT ordered but has not had yet. No head injury. She had severe HA earlier today and called EMS. She c  The history is provided by the patient.    Past Medical History  Diagnosis Date  . Hypertension   . Hepatitis C   . Arthritis   . Anxiety    No past surgical history on file. No family history on file. History  Substance Use Topics  . Smoking status: Current Every Day Smoker  . Smokeless tobacco: Not on file     Comment: admits to cuttting back 4 cigarettes/day  . Alcohol Use: No   OB History   Grav Para Term Preterm Abortions TAB SAB Ect Mult Living                 Review of Systems  Constitutional: Negative for fever.  HENT: Negative for congestion, dental problem, rhinorrhea and sinus pressure.   Eyes: Negative for photophobia, discharge, redness and visual disturbance.  Respiratory: Negative for shortness of breath.   Cardiovascular: Negative for chest pain.  Gastrointestinal: Positive for nausea. Negative for vomiting.  Musculoskeletal: Negative for  gait problem, neck pain and neck stiffness.  Skin: Negative for rash.  Neurological: Positive for headaches. Negative for syncope, speech difficulty, weakness, light-headedness and numbness.  Psychiatric/Behavioral: Negative for confusion.    Allergies  Review of patient's allergies indicates no known allergies.  Home Medications   Prior to Admission medications   Medication Sig Start Date End Date Taking? Authorizing Provider  amLODipine (NORVASC) 10 MG tablet Take 10 mg by mouth every morning.    Yes Historical Provider, MD  cyclobenzaprine (FLEXERIL) 10 MG tablet Take 1 tablet (10 mg total) by mouth 2 (two) times daily as needed for muscle spasms. 03/01/14   Tiffany Marilu Favre, PA-C  gabapentin (NEURONTIN) 300 MG capsule Take 1 capsule (300 mg total) by mouth 2 (two) times daily. 06/28/14   Lance Bosch, NP  hydrochlorothiazide (MICROZIDE) 12.5 MG capsule Take 12.5 mg by mouth daily.    Historical Provider, MD  metFORMIN (GLUCOPHAGE) 500 MG tablet Take 1 tablet (500 mg total) by mouth 2 (two) times daily with a meal. 06/28/14   Lance Bosch, NP  oxyCODONE-acetaminophen (PERCOCET/ROXICET) 5-325 MG per tablet Take 1 tablet by mouth every 6 (six) hours as needed for severe pain. 03/01/14   Tiffany Marilu Favre, PA-C  predniSONE (DELTASONE) 10 MG tablet Take 2 tablets (20 mg total) by mouth daily. 03/01/14   Tiffany Marilu Favre, PA-C   BP 130/92  Pulse 95  Temp(Src) 98.2 F (36.8 C) (  Oral)  Resp 18  SpO2 97%  Physical Exam  Nursing note and vitals reviewed. Constitutional: She is oriented to person, place, and time. She appears well-developed and well-nourished.  HENT:  Head: Normocephalic and atraumatic.  Right Ear: Tympanic membrane, external ear and ear canal normal.  Left Ear: Tympanic membrane, external ear and ear canal normal.  Nose: Nose normal.  Mouth/Throat: Uvula is midline, oropharynx is clear and moist and mucous membranes are normal.  Eyes: Conjunctivae, EOM and lids are  normal. Pupils are equal, round, and reactive to light. Right eye exhibits no nystagmus. Left eye exhibits no nystagmus.  Neck: Normal range of motion. Neck supple.  Cardiovascular: Normal rate and regular rhythm.   Pulmonary/Chest: Effort normal and breath sounds normal.  Abdominal: Soft. There is no tenderness.  Musculoskeletal:       Cervical back: She exhibits normal range of motion, no tenderness and no bony tenderness.  Neurological: She is alert and oriented to person, place, and time. She has normal strength and normal reflexes. No cranial nerve deficit or sensory deficit. She displays a negative Romberg sign. Coordination and gait normal. GCS eye subscore is 4. GCS verbal subscore is 5. GCS motor subscore is 6.  Skin: Skin is warm and dry.  Psychiatric: She has a normal mood and affect.    ED Course  Procedures (including critical care time) Labs Review Labs Reviewed - No data to display  Imaging Review No results found.   EKG Interpretation None      7:05 PM Patient seen and examined.   Vital signs reviewed and are as follows: BP 130/92  Pulse 95  Temp(Src) 98.2 F (36.8 C) (Oral)  Resp 18  SpO2 97%  9:02 PM Patient informed of results. She seems very relieved. She has no HA now. She is requesting discharge to home.   Will d/c to home with PCP and neurology referral.   Patient was counseled on precautions and symptoms that should indicate their return to the ED.  These include severe worsening headache, vision changes, confusion, loss of consciousness, trouble walking, nausea & vomiting, or weakness/tingling in extremities.    MDM   Final diagnoses:  Acute nonintractable headache, unspecified headache type   CT neg. No sign of mass. I do not suspect bleed. This is reoccurring for weeks, gradual onset.   Patient has a normal complete neurological exam, normal vital signs, normal level of consciousness, no signs of meningismus, is well-appearing/non-toxic  appearing, no signs of trauma, no pain over the temporal arteries.    No dangerous or life-threatening conditions suspected or identified by history, physical exam, and by work-up. No indications for hospitalization identified.      Carlisle Cater, PA-C 08/03/14 2106

## 2014-08-06 ENCOUNTER — Telehealth: Payer: Self-pay | Admitting: *Deleted

## 2014-08-06 NOTE — Telephone Encounter (Signed)
Attempted to reach patient to discuss lab results, however, recorded message stated that this number was not accepting calls at this time. Unable to leave message.

## 2014-08-06 NOTE — Telephone Encounter (Signed)
Message copied by Velora Heckler on Tue Aug 06, 2014 12:27 PM ------      Message from: Chari Manning A      Created: Fri Jul 12, 2014 10:12 AM       Please call patient and let him know that his kidney function was elevated and that I want him to stop the metformin. Please have patient switch to glipizide 5 mg daily. Also please send prescription of potassium 10 mEq to be taken daily. Patient will also need to get some over-the-counter vitamin D 1000 international units to be taken daily, vitamin D level was low. Please have patient come back in one month for a CMP redraw. ------

## 2014-08-08 ENCOUNTER — Ambulatory Visit: Payer: Medicaid Other

## 2014-08-14 ENCOUNTER — Ambulatory Visit (INDEPENDENT_AMBULATORY_CARE_PROVIDER_SITE_OTHER): Payer: Medicaid Other | Admitting: Internal Medicine

## 2014-08-14 ENCOUNTER — Telehealth: Payer: Self-pay | Admitting: Internal Medicine

## 2014-08-14 ENCOUNTER — Other Ambulatory Visit: Payer: Self-pay | Admitting: *Deleted

## 2014-08-14 ENCOUNTER — Encounter: Payer: Self-pay | Admitting: Internal Medicine

## 2014-08-14 VITALS — BP 147/82 | HR 88 | Temp 97.2°F | Wt 160.0 lb

## 2014-08-14 DIAGNOSIS — B182 Chronic viral hepatitis C: Secondary | ICD-10-CM

## 2014-08-14 DIAGNOSIS — Z23 Encounter for immunization: Secondary | ICD-10-CM

## 2014-08-14 DIAGNOSIS — Z8619 Personal history of other infectious and parasitic diseases: Secondary | ICD-10-CM | POA: Insufficient documentation

## 2014-08-14 NOTE — Addendum Note (Signed)
Addended by: Reggy Eye on: 08/14/2014 03:23 PM   Modules accepted: Orders

## 2014-08-14 NOTE — Telephone Encounter (Signed)
Patient complaint she has been calling and nobody answer the phone. Also, she doesn't have more medication and she needs to be seen but it's too late for Walk In. Patient requests medication refill for Klonopin 1 mg TODAY. Please f/u with Patient

## 2014-08-14 NOTE — Patient Instructions (Signed)
Date 08/14/2014  Dear Ms Marcello Moores, As discussed in the [facility], your hepatitis C therapy will include the following medications:          Harvoni 90mg /400mg  tablet:           Take 1 tablet by mouth once daily   Please note that ALL MEDICATIONS WILL START ON THE SAME DATE for a total of 12 weeks. ---------------------------------------------------------------- Your HCV Treatment Start Date:  TBA   Your HCV genotype:  1b    Liver Fibrosis:   TBD  ---------------------------------------------------------------- YOUR PHARMACY CONTACT:   [facility] [facility] [place] Phone: [phone] Hours: Monday to Friday 7:30 am to 6:00 pm   Please always contact your pharmacy at least 3-4 business days before you run out of medications to ensure your next month's medication is ready or 1 week prior to running out if you receive it by mail.  Remember, each prescription is for 28 days. ---------------------------------------------------------------- GENERAL NOTES REGARDING YOUR HEPATITIS C MEDICATIONS:  SOFOSBUVIR/LEDIPASVIR (HARVONI): - Harvoni tablet is taken daily with OR without food. - The tablets are orange. - The tablets should be stored at room temperature.  - Acid reducing agents such as H2 blockers (ie. Pepcid (famotidine), Zantac (ranitidine), Tagamet (cimetidine), Axid (nizatidine) and proton pump inhibitors (ie. Prilosec (omeprazole), Protonix (pantoprazole), Nexium (esomeprazole), or Aciphex (rabeprazole)) can decrease effectiveness of Harvoni. Do not take until you have discussed with a health care provider.    -Antacids that contain magnesium and/or aluminum hydroxide (ie. Milk of Magensia, Rolaids, Gaviscon, Maalox, Mylanta, an dArthritis Pain Formula)can reduce absorption of Harvoni, so take them at least 4 hours before or after Harvoni.  -Calcium carbonate (calcium supplements or antacids such as Tums, Caltrate,  Os-Cal)needs to be taken at least 4 hours hours before or after Harvoni.  -St. John's wort or any products that contain St. John's wort like some herbal supplements  Please inform the office prior to starting any of these medications.  - The common side effects with Harvoni:      1. Fatigue      2. Headache      3. Nausea      4. Diarrhea      5. Insomnia   Support Path is a suite of resources designed to help patients start with HARVONI and move toward treatment completion [place] helps patients access therapy and get off to an efficient start  Benefits investigation and prior authorization support Co-pay and other financial assistance A specialty pharmacy finder CO-PAY COUPON The [facility] co-pay coupon may help eligible patients lower their out-of-pocket costs. With a co-pay coupon, most eligible patients may pay no more than $5 per co-pay (restrictions apply) www.harvoni.com call 2174987936 Not valid for patients enrolled in government healthcare prescription drug programs, such as Medicare Part D and Medicaid. Patients in the coverage gap known as the "donut hole" also are not eligible The HARVONI co-pay coupon program will cover the out-of-pocket costs for HARVONI prescriptions up to a maximum of 25% of the catalog price of a 12-week regimen of HARVONI  Please note that this only lists the most common side effects and is NOT a comprehensive list of the potential side effects of these medications. For more information, please review the drug information sheets that come with your medication package from the pharmacy.  ---------------------------------------------------------------- GENERAL HELPFUL HINTS ON HCV THERAPY: 1. No alcohol. 2. Protect against sun-sensitivity/sunburns (wear sunglasses, hat, long sleeves,  pants and sunscreen). 3. Stay well-hydrated/well-moisturized. 4. Notify the [facility] of any changes in your other over-the-counter/herbal or  prescription medications. 5. If you miss a dose of your medication, take the missed dose as soon as you remember. Return to your regular time/dose schedule the next day.  6.  Do not stop taking your medications without first talking with your healthcare provider. 7.  You may take Tylenol (acetaminophen), as long as the dose is less than 2000 mg (OR no more than 4 tablets of the Tylenol Extra Strengths 500mg  tablet) in 24 hours. 8.  You will need to obtain routine labs and/or office visits at [facility] at weeks 2, 4, 8,  and 12 as well as 12 and 24 weeks after completion of treatment.  If you are not compliant with labs and office visits, we may discontinue HCV therapy.  Scharlene Gloss, [place] for [place] [facility], [place]  [phone]

## 2014-08-14 NOTE — Progress Notes (Signed)
+Mary Stephens is a 58 y.o. female who presents for initial evaluation and management of a positive Hepatitis C antibody test.  Patient tested positive screen for hep c. Test was performed as part of an evaluation of screen. Hepatitis C risk factors present are: sexual contact with person with liver disease (details: former parnter). Patient denies IV drug abuse, multiple sexual partners, tattoos. Patient has had other studies performed. Results: hepatitis C RNA by PCR, result: positive. Patient has not had prior treatment for Hepatitis C. Patient does not have a past history of liver disease. Patient does not have a family history of liver disease.   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 Does not drink alcohol.  No drug use for over 30 years.  Was considered for treatment but never started.     Patient does have documented immunity to Hepatitis A. Patient does not have documented immunity to Hepatitis B.     Review of Systems A comprehensive review of systems was negative.   Past Medical History  Diagnosis Date  . Hypertension   . Hepatitis C   . Arthritis   . Anxiety   no liver disease  History  Substance Use Topics  . Smoking status: Current Every Day Smoker  . Smokeless tobacco: Not on file     Comment: admits to cuttting back 4 cigarettes/day  . Alcohol Use: No    No family history on file. No history of liver disease, no cirrhosis   Objective:   Filed Vitals:   08/14/14 1348  BP: 147/82  Pulse: 88  Temp: 97.2 F (36.2 C)   well developed and well nourished and alert HEENT: anicteric Cor RRR and No murmurs clear Bowel sounds are normal, liver is not enlarged, spleen is not enlarged peripheral pulses normal, no pedal edema, no clubbing or cyanosis negative for - jaundice,  spider hemangioma, telangiectasia, palmar erythema, ecchymosis and atrophy  Laboratory Genotype:  Lab Results  Component Value Date   HCVGENOTYPE 1b 07/08/2014   HCV viral load:  Lab Results  Component Value Date   HCVQUANT [id number]* 07/08/2014   Lab Results  Component Value Date   WBC 7.4 06/28/2014   HGB 15.0 06/28/2014   HCT 42.6 06/28/2014   MCV 95.7 06/28/2014   PLT 206 06/28/2014    Lab Results  Component Value Date   CREATININE 1.35* 06/28/2014   BUN 25* 06/28/2014   NA 138 06/28/2014   K 3.2* 06/28/2014   CL 103 06/28/2014   CO2 26 06/28/2014    Lab Results  Component Value Date   ALT 80* 06/28/2014   AST 64* 06/28/2014   ALKPHOS 83 06/28/2014   BILITOT 0.5 06/28/2014   INR 1.00 07/08/2014      Assessment: Hepatitis C genotype 1b  Plan: 1) Patient counseled extensively on limiting acetaminophen to no more than 2 grams daily, avoidance of alcohol. 2) Transmission discussed with patient including sexual transmission, sharing razors and toothbrush.   3) Will need referral to gastroenterology: No.if F4, previous biopsy F3 in 2010 at [facility].  She knows to expect referral after scan if appropriate.  Discussed EGD and why.   4) Will need referral for substance abuse counseling: No. 5) Will prescribe Harvoni for 12 weeks once work up complete; she will follow up after starting or 1 year.  6)  Hepatitis A vaccine No. 7) Hepatitis B vaccine Yes.   8) Pneumovax vaccine Yes.  will do for age, possible cirrhosis.

## 2014-08-15 ENCOUNTER — Encounter: Payer: Self-pay | Admitting: Internal Medicine

## 2014-08-15 ENCOUNTER — Ambulatory Visit: Payer: Medicaid Other | Attending: Internal Medicine | Admitting: Internal Medicine

## 2014-08-15 VITALS — BP 119/89 | HR 98 | Temp 99.7°F | Resp 16 | Ht 69.0 in | Wt 164.0 lb

## 2014-08-15 DIAGNOSIS — R5381 Other malaise: Secondary | ICD-10-CM | POA: Diagnosis not present

## 2014-08-15 DIAGNOSIS — I10 Essential (primary) hypertension: Secondary | ICD-10-CM | POA: Diagnosis not present

## 2014-08-15 DIAGNOSIS — G589 Mononeuropathy, unspecified: Secondary | ICD-10-CM

## 2014-08-15 DIAGNOSIS — R5383 Other fatigue: Secondary | ICD-10-CM

## 2014-08-15 DIAGNOSIS — Z8619 Personal history of other infectious and parasitic diseases: Secondary | ICD-10-CM | POA: Insufficient documentation

## 2014-08-15 DIAGNOSIS — F17200 Nicotine dependence, unspecified, uncomplicated: Secondary | ICD-10-CM | POA: Insufficient documentation

## 2014-08-15 DIAGNOSIS — E119 Type 2 diabetes mellitus without complications: Secondary | ICD-10-CM | POA: Diagnosis not present

## 2014-08-15 DIAGNOSIS — D239 Other benign neoplasm of skin, unspecified: Secondary | ICD-10-CM | POA: Diagnosis not present

## 2014-08-15 DIAGNOSIS — G629 Polyneuropathy, unspecified: Secondary | ICD-10-CM

## 2014-08-15 DIAGNOSIS — D229 Melanocytic nevi, unspecified: Secondary | ICD-10-CM

## 2014-08-15 LAB — GLUCOSE, POCT (MANUAL RESULT ENTRY): POC Glucose: 190 mg/dl — AB (ref 70–99)

## 2014-08-15 MED ORDER — GABAPENTIN 300 MG PO CAPS: 300.0000 mg | ORAL_CAPSULE | Freq: Three times a day (TID) | ORAL | Status: DC

## 2014-08-15 NOTE — Progress Notes (Signed)
Pt reports feeling very tired. Pt had a pneumonia, flu and a hep c shot. Afterwards pt has been in extreme pain in her joints and having headaches.

## 2014-08-15 NOTE — Progress Notes (Signed)
Patient ID: Mary Stephens, female   DOB: 08/22/1956, 58 y.o.   MRN: [id number]  CC: fatigue  HPI:  Patient presents today as a follow up. She states that she was seen yesterday by the infectious disease doctor and was given a series of injections for pneumonia, Hep B, and influenza.  Patient reports that she has since been fatigued with joint pain.  Pt reports that a large mole on left side of her face has been increasing in size and burning over past six months. She would like the mole removed if possible.   No Known Allergies Past Medical History  Diagnosis Date  . Hypertension   . Hepatitis C   . Arthritis   . Anxiety    Current Outpatient Prescriptions on File Prior to Visit  Medication Sig Dispense Refill  . amLODipine (NORVASC) 10 MG tablet Take 10 mg by mouth every morning.       . gabapentin (NEURONTIN) 300 MG capsule Take 300 mg by mouth at bedtime.      . hydrochlorothiazide (MICROZIDE) 12.5 MG capsule Take 12.5 mg by mouth every morning.       . metFORMIN (GLUCOPHAGE) 500 MG tablet Take 1 tablet (500 mg total) by mouth 2 (two) times daily with a meal.  180 tablet  3  . valACYclovir (VALTREX) 500 MG tablet Take 500 mg by mouth 2 (two) times daily.       No current facility-administered medications on file prior to visit.   History reviewed. No pertinent family history. History   Social History  . Marital Status: Single    Spouse Name: N/A    Number of Children: N/A  . Years of Education: N/A   Occupational History  . Not on file.   Social History Main Topics  . Smoking status: Current Every Day Smoker  . Smokeless tobacco: Not on file     Comment: admits to cuttting back 4 cigarettes/day  . Alcohol Use: No  . Drug Use: No  . Sexual Activity: Not on file   Other Topics Concern  . Not on file   Social History Narrative  . No narrative on file   Review of Systems  Constitutional: Positive for fever, chills and malaise/fatigue. Negative for diaphoresis.    Musculoskeletal: Positive for joint pain and myalgias.  Neurological: Negative for dizziness, tingling (BLE) and seizures.  Psychiatric/Behavioral: Positive for hallucinations.      Objective:   Filed Vitals:   08/15/14 1104  BP: 119/89  Pulse: 98  Temp: 99.7 F (37.6 C)  Resp: 16   Physical Exam  Constitutional: She is oriented to person, place, and time.  HENT:  Head:    [place]   Cardiovascular: Normal rate, regular rhythm and normal heart sounds.   Pulmonary/Chest: Effort normal and breath sounds normal.  Neurological: She is alert and oriented to person, place, and time.  Skin: Skin is warm and dry. No rash noted. She is not diaphoretic.     Lab Results  Component Value Date   WBC 7.4 06/28/2014   HGB 15.0 06/28/2014   HCT 42.6 06/28/2014   MCV 95.7 06/28/2014   PLT 206 06/28/2014   Lab Results  Component Value Date   CREATININE 1.35* 06/28/2014   BUN 25* 06/28/2014   NA 138 06/28/2014   K 3.2* 06/28/2014   CL 103 06/28/2014   CO2 26 06/28/2014    Lab Results  Component Value Date   HGBA1C 6.8 06/28/2014   Lipid Panel  Component Value Date/Time   CHOL 171 07/01/2014 1023   TRIG 162* 07/01/2014 1023   HDL 39* 07/01/2014 1023   CHOLHDL 4.4 07/01/2014 1023   VLDL 32 07/01/2014 1023   LDLCALC 100* 07/01/2014 1023       Assessment and plan:   Kaylynn was seen today for follow-up.  Diagnoses and associated orders for this visit:  Type 2 diabetes mellitus without complication - Glucose (CBG)  Neuropathy - Increased to gabapentin (NEURONTIN) 300 MG capsule; Take 1 capsule (300 mg total) by mouth 3 (three) times daily. Was only taking 100 mg at night  Other fatigue Explained that she may have some fever, malaise, and fatigue since receiving so many immunizations yesterday. Explained patho behind how body responds to immunizations  Multiple nevi - Ambulatory referral to Dermatology   Return in about 1 month (around 09/14/2014) for Diabetes Mellitus-will  recheck a1c at that time.        Chari Manning, NP-C [facility] and [facility] [phone] 08/15/2014, 11:32 AM

## 2014-08-15 NOTE — Patient Instructions (Signed)
Moles Moles are usually harmless growths on the skin. They are accumulations of color (pigment) cells in the skin that:   Can be various colors, from light brown to black.  Can appear anywhere on the body.  May remain flat or become raised.  May contain hairs.  May remain smooth or develop wrinkling. Most moles are not cancerous (benign). However, some moles may develop changes and become cancerous. It is important to check your moles every month. If you check your moles regularly, you will be able to notice any changes that may occur.  CAUSES  Moles occur when skin cells grow together in clusters instead of spreading out in the skin as they normally do. The reason for this clustering is unknown. DIAGNOSIS  Your caregiver will perform a skin examination to diagnose your mole.  TREATMENT  Moles usually do not require treatment. If a mole becomes worrisome, your caregiver may choose to take a sample of the mole or remove it entirely, and then send it to a lab for examination.  HOME CARE INSTRUCTIONS  Check your mole(s) monthly for changes that may indicate skin cancer. These changes can include:  A change in size.  A change in color. Note that moles tend to darken during pregnancy or when taking birth control pills (oral contraception).  A change in shape.  A change in the border of the mole.  Wear sunscreen (with an SPF of at least 30) when you spend long periods of time outside. Reapply the sunscreen every 2-3 hours.  Schedule annual appointments with your skin doctor (dermatologist) if you have a large number of moles. SEEK MEDICAL CARE IF:  Your mole changes size, especially if it becomes larger than a pencil eraser.  Your mole changes in color or develops more than one color.  Your mole becomes itchy or bleeds.  Your mole, or the skin near the mole, becomes painful, sore, red, or swollen.  Your mole becomes scaly, sheds skin, or oozes fluid.  Your mole develops  irregular borders.  Your mole becomes flat or develops raised areas.  Your mole becomes hard or soft. Document Released: 08/24/2001 Document Revised: 08/23/2012 Document Reviewed: 06/12/2012 Exit[employer] Patient Information 2015 [employer]. This information is not intended to replace advice given to you by your health care provider. Make sure you discuss any questions you have with your health care provider.  

## 2014-08-21 ENCOUNTER — Encounter: Payer: Self-pay | Admitting: Internal Medicine

## 2014-08-22 ENCOUNTER — Telehealth: Payer: Self-pay | Admitting: *Deleted

## 2014-08-22 ENCOUNTER — Other Ambulatory Visit: Payer: Self-pay | Admitting: Internal Medicine

## 2014-08-22 NOTE — Telephone Encounter (Signed)
Called the patient to give her an appt for her Liver Scan. 09/03/14 at 9 am at [facility] and to arrive 15 minutes early and NPO midnight. Had to leave a message for her to call back.

## 2014-08-26 NOTE — Telephone Encounter (Signed)
Pt returned call - appt info given to pt.  NPO after midnight.  Pt verbalized back understanding.

## 2014-08-28 ENCOUNTER — Telehealth: Payer: Self-pay | Admitting: *Deleted

## 2014-08-28 NOTE — Telephone Encounter (Signed)
Left message to return my call.  

## 2014-08-29 ENCOUNTER — Encounter: Payer: Medicaid Other | Attending: Internal Medicine

## 2014-08-29 VITALS — Ht 69.0 in | Wt 162.2 lb

## 2014-08-29 DIAGNOSIS — E119 Type 2 diabetes mellitus without complications: Secondary | ICD-10-CM | POA: Insufficient documentation

## 2014-08-30 NOTE — Progress Notes (Signed)
Patient was seen on 08/29/14 for the first of a series of three diabetes self-management courses at [facility].  Patient Education Plan per assessed needs and concerns is to attend four course education program for Diabetes Self Management Education.  Current HbA1c: 7.1%  The following learning objectives were met by the patient during this class:  Describe diabetes  State some common risk factors for diabetes  Defines the role of glucose and insulin  Identifies type of diabetes and pathophysiology  Describe the relationship between diabetes and cardiovascular risk  State the members of the Healthcare Team  States the rationale for glucose monitoring  State when to test glucose  State their individual Target Range  State the importance of logging glucose readings  Describe how to interpret glucose readings  Identifies A1C target  Explain the correlation between A1c and eAG values  State symptoms and treatment of high blood glucose  State symptoms and treatment of low blood glucose  Explain proper technique for glucose testing  Identifies proper sharps disposal  Handouts given during class include:  Living Well with Diabetes book  Carb Counting and Meal Planning book  Meal Plan Card  Carbohydrate guide  Meal planning worksheet  Low Sodium Flavoring Tips  The diabetes portion plate  W6O to eAG Conversion Chart  Diabetes Medications  Diabetes Recommended Care Schedule  Support Group  Diabetes Success Plan  Core Class Satisfaction Survey  Follow-Up Plan:  Attend core 2

## 2014-09-03 ENCOUNTER — Other Ambulatory Visit: Payer: Self-pay | Admitting: Internal Medicine

## 2014-09-03 ENCOUNTER — Ambulatory Visit (HOSPITAL_COMMUNITY)
Admission: RE | Admit: 2014-09-03 | Discharge: 2014-09-03 | Disposition: A | Discharge status: Home / Self Care | Payer: Medicaid Other | Source: Ambulatory Visit | Attending: Internal Medicine | Admitting: Internal Medicine

## 2014-09-03 DIAGNOSIS — B182 Chronic viral hepatitis C: Secondary | ICD-10-CM | POA: Insufficient documentation

## 2014-09-03 DIAGNOSIS — K7689 Other specified diseases of liver: Secondary | ICD-10-CM | POA: Insufficient documentation

## 2014-09-03 DIAGNOSIS — K824 Cholesterolosis of gallbladder: Secondary | ICD-10-CM | POA: Diagnosis not present

## 2014-09-03 MED ORDER — LEDIPASVIR-SOFOSBUVIR 90-400 MG PO TABS: 1.0000 | ORAL_TABLET | Freq: Every day | ORAL | Status: DC

## 2014-09-05 DIAGNOSIS — E119 Type 2 diabetes mellitus without complications: Secondary | ICD-10-CM

## 2014-09-06 NOTE — Progress Notes (Signed)

## 2014-09-12 ENCOUNTER — Encounter: Payer: Medicaid Other | Attending: Internal Medicine

## 2014-09-12 ENCOUNTER — Encounter: Payer: Self-pay | Admitting: Internal Medicine

## 2014-09-12 ENCOUNTER — Ambulatory Visit: Payer: Medicaid Other | Attending: Internal Medicine | Admitting: Internal Medicine

## 2014-09-12 VITALS — BP 149/95 | HR 88 | Temp 97.7°F | Resp 22 | Ht 68.5 in | Wt 163.2 lb

## 2014-09-12 DIAGNOSIS — R748 Abnormal levels of other serum enzymes: Secondary | ICD-10-CM

## 2014-09-12 DIAGNOSIS — B1920 Unspecified viral hepatitis C without hepatic coma: Secondary | ICD-10-CM | POA: Insufficient documentation

## 2014-09-12 DIAGNOSIS — R7989 Other specified abnormal findings of blood chemistry: Secondary | ICD-10-CM | POA: Diagnosis not present

## 2014-09-12 DIAGNOSIS — G629 Polyneuropathy, unspecified: Secondary | ICD-10-CM | POA: Insufficient documentation

## 2014-09-12 DIAGNOSIS — E118 Type 2 diabetes mellitus with unspecified complications: Secondary | ICD-10-CM | POA: Diagnosis present

## 2014-09-12 DIAGNOSIS — F419 Anxiety disorder, unspecified: Secondary | ICD-10-CM | POA: Insufficient documentation

## 2014-09-12 DIAGNOSIS — F17200 Nicotine dependence, unspecified, uncomplicated: Secondary | ICD-10-CM | POA: Insufficient documentation

## 2014-09-12 DIAGNOSIS — M1990 Unspecified osteoarthritis, unspecified site: Secondary | ICD-10-CM | POA: Insufficient documentation

## 2014-09-12 DIAGNOSIS — E119 Type 2 diabetes mellitus without complications: Secondary | ICD-10-CM | POA: Insufficient documentation

## 2014-09-12 DIAGNOSIS — I10 Essential (primary) hypertension: Secondary | ICD-10-CM | POA: Diagnosis not present

## 2014-09-12 LAB — COMPLETE METABOLIC PANEL WITH GFR
ALT: 52 U/L — AB (ref 0–35)
AST: 54 U/L — AB (ref 0–37)
Albumin: 4.2 g/dL (ref 3.5–5.2)
Alkaline Phosphatase: 66 U/L (ref 39–117)
BILIRUBIN TOTAL: 0.5 mg/dL (ref 0.2–1.2)
BUN: 18 mg/dL (ref 6–23)
CO2: 22 mEq/L (ref 19–32)
Calcium: 9.7 mg/dL (ref 8.4–10.5)
Chloride: 104 mEq/L (ref 96–112)
Creat: 1.11 mg/dL — ABNORMAL HIGH (ref 0.50–1.10)
GFR, EST AFRICAN AMERICAN: 62 mL/min
GFR, Est Non African American: 54 mL/min — ABNORMAL LOW
Glucose, Bld: 97 mg/dL (ref 70–99)
Potassium: 3.3 mEq/L — ABNORMAL LOW (ref 3.5–5.3)
Sodium: 141 mEq/L (ref 135–145)
Total Protein: 8 g/dL (ref 6.0–8.3)

## 2014-09-12 LAB — POCT GLYCOSYLATED HEMOGLOBIN (HGB A1C): HEMOGLOBIN A1C: 5.7

## 2014-09-12 LAB — GLUCOSE, POCT (MANUAL RESULT ENTRY): POC Glucose: 108 mg/dl — AB (ref 70–99)

## 2014-09-12 MED ORDER — AMLODIPINE BESYLATE 10 MG PO TABS: 10.0000 mg | ORAL_TABLET | Freq: Every morning | ORAL | Status: DC

## 2014-09-12 MED ORDER — HYDROCHLOROTHIAZIDE 12.5 MG PO CAPS: 12.5000 mg | ORAL_CAPSULE | Freq: Every morning | ORAL | Status: DC

## 2014-09-12 MED ORDER — FREESTYLE SYSTEM KIT: 1.0000 | PACK | Status: DC | PRN

## 2014-09-12 MED ORDER — GABAPENTIN 300 MG PO CAPS: 300.0000 mg | ORAL_CAPSULE | Freq: Three times a day (TID) | ORAL | Status: DC

## 2014-09-12 MED ORDER — FREESTYLE LANCETS MISC: Status: DC

## 2014-09-12 MED ORDER — GLUCOSE BLOOD VI STRP: ORAL_STRIP | Status: DC

## 2014-09-12 MED ORDER — GLIPIZIDE ER 2.5 MG PO TB24: 2.5000 mg | ORAL_TABLET | Freq: Every day | ORAL | Status: DC

## 2014-09-12 NOTE — Patient Instructions (Addendum)
Your blood level indicating kidney function was off and I am repeating that blood work today.  I will call you in a few days with the results and further instructions.  If there has not been any improvement in kidney function I will send you to a kidney doctor.  Dr. Linus Salmons will explain all other results of your ultrasound when you visit him.      Blood Glucose Monitoring Monitoring your blood glucose (also know as blood sugar) helps you to manage your diabetes. It also helps you and your health care provider monitor your diabetes and determine how well your treatment plan is working. WHY SHOULD YOU MONITOR YOUR BLOOD GLUCOSE?  It can help you understand how food, exercise, and medicine affect your blood glucose.  It allows you to know what your blood glucose is at any given moment. You can quickly tell if you are having low blood glucose (hypoglycemia) or high blood glucose (hyperglycemia).  It can help you and your health care provider know how to adjust your medicines.  It can help you understand how to manage an illness or adjust medicine for exercise. WHEN SHOULD YOU TEST? Your health care provider will help you decide how often you should check your blood glucose. This may depend on the type of diabetes you have, your diabetes control, or the types of medicines you are taking. Be sure to write down all of your blood glucose readings so that this information can be reviewed with your health care provider. See below for examples of testing times that your health care provider may suggest. Type 1 Diabetes  Test 4 times a day if you are in good control, using an insulin pump, or perform multiple daily injections.  If your diabetes is not well controlled or if you are sick, you may need to monitor more often.  It is a good idea to also monitor:  Before and after exercise.  Between meals and 2 hours after a meal.  Occasionally between 2:00 a.m. and 3:00 a.m. Type 2 Diabetes  It can vary  with each person, but generally, if you are on insulin, test 4 times a day.  If you take medicines by mouth (orally), test 2 times a day.  If you are on a controlled diet, test once a day.  If your diabetes is not well controlled or if you are sick, you may need to monitor more often. HOW TO MONITOR YOUR BLOOD GLUCOSE Supplies Needed  Blood glucose meter.  Test strips for your meter. Each meter has its own strips. You must use the strips that go with your own meter.  A pricking needle (lancet).  A device that holds the lancet (lancing device).  A journal or log book to write down your results. Procedure  Wash your hands with soap and water. Alcohol is not preferred.  Prick the side of your finger (not the tip) with the lancet.  Gently milk the finger until a small drop of blood appears.  Follow the instructions that come with your meter for inserting the test strip, applying blood to the strip, and using your blood glucose meter. Other Areas to Get Blood for Testing Some meters allow you to use other areas of your body (other than your finger) to test your blood. These areas are called alternative sites. The most common alternative sites are:  The forearm.  The thigh.  The back area of the lower leg.  The palm of the hand. The blood flow  in these areas is slower. Therefore, the blood glucose values you get may be delayed, and the numbers are different from what you would get from your fingers. Do not use alternative sites if you think you are having hypoglycemia. Your reading will not be accurate. Always use a finger if you are having hypoglycemia. Also, if you cannot feel your lows (hypoglycemia unawareness), always use your fingers for your blood glucose checks. ADDITIONAL TIPS FOR GLUCOSE MONITORING  Do not reuse lancets.  Always carry your supplies with you.  All blood glucose meters have a 24-hour "hotline" number to call if you have questions or need help.  Adjust  (calibrate) your blood glucose meter with a control solution after finishing a few boxes of strips. BLOOD GLUCOSE RECORD KEEPING It is a good idea to keep a daily record or log of your blood glucose readings. Most glucose meters, if not all, keep your glucose records stored in the meter. Some meters come with the ability to download your records to your home computer. Keeping a record of your blood glucose readings is especially helpful if you are wanting to look for patterns. Make notes to go along with the blood glucose readings because you might forget what happened at that exact time. Keeping good records helps you and your health care provider to work together to achieve good diabetes management.  Document Released: 12/02/2003 Document Revised: 04/15/2014 Document Reviewed: 04/23/2013 [facility] Patient Information 2015 [place], [place]. This information is not intended to replace advice given to you by your health care provider. Make sure you discuss any questions you have with your health care provider.

## 2014-09-12 NOTE — Progress Notes (Signed)
Patient presents for f/u on DM Requesting referral to podiatry Requesting home glucose monitor

## 2014-10-16 ENCOUNTER — Telehealth: Payer: Self-pay | Admitting: *Deleted

## 2014-10-16 NOTE — Telephone Encounter (Signed)
Patient called stating she was mailed paperwork to sign for [facility] and would bring it in tomorrow 10/17/14, between 9 and [place], pharmacist, will assist her with this.

## 2014-10-17 ENCOUNTER — Telehealth: Payer: Self-pay | Admitting: *Deleted

## 2014-10-17 MED ORDER — POTASSIUM CHLORIDE ER 10 MEQ PO TBCR: 10.0000 meq | EXTENDED_RELEASE_TABLET | Freq: Every day | ORAL | Status: DC

## 2014-10-17 NOTE — Telephone Encounter (Addendum)
Patient walked in asking for lab results Lab results reviewed and copy given Potassium 10 mEq 1 tab po daily #30 with no refills e-scribed to [facility] on [address] in to discuss papers pt brought with her from [facility]

## 2014-10-18 ENCOUNTER — Encounter (HOSPITAL_COMMUNITY): Payer: Self-pay | Admitting: Emergency Medicine

## 2014-10-18 ENCOUNTER — Emergency Department (HOSPITAL_COMMUNITY)
Admission: EM | Admit: 2014-10-18 | Discharge: 2014-10-18 | Disposition: A | Discharge status: Home / Self Care | Payer: Medicaid Other | Attending: Emergency Medicine | Admitting: Emergency Medicine

## 2014-10-18 DIAGNOSIS — Z79899 Other long term (current) drug therapy: Secondary | ICD-10-CM | POA: Diagnosis not present

## 2014-10-18 DIAGNOSIS — Z8739 Personal history of other diseases of the musculoskeletal system and connective tissue: Secondary | ICD-10-CM | POA: Diagnosis not present

## 2014-10-18 DIAGNOSIS — Z720 Tobacco use: Secondary | ICD-10-CM | POA: Insufficient documentation

## 2014-10-18 DIAGNOSIS — M542 Cervicalgia: Secondary | ICD-10-CM | POA: Diagnosis not present

## 2014-10-18 DIAGNOSIS — M62838 Other muscle spasm: Secondary | ICD-10-CM | POA: Diagnosis not present

## 2014-10-18 DIAGNOSIS — F419 Anxiety disorder, unspecified: Secondary | ICD-10-CM | POA: Insufficient documentation

## 2014-10-18 DIAGNOSIS — I10 Essential (primary) hypertension: Secondary | ICD-10-CM | POA: Diagnosis not present

## 2014-10-18 DIAGNOSIS — M549 Dorsalgia, unspecified: Secondary | ICD-10-CM | POA: Diagnosis present

## 2014-10-18 DIAGNOSIS — E119 Type 2 diabetes mellitus without complications: Secondary | ICD-10-CM | POA: Diagnosis not present

## 2014-10-18 DIAGNOSIS — Z8619 Personal history of other infectious and parasitic diseases: Secondary | ICD-10-CM | POA: Insufficient documentation

## 2014-10-18 MED ORDER — NAPROXEN 500 MG PO TABS: 500.0000 mg | ORAL_TABLET | Freq: Two times a day (BID) | ORAL | Status: DC

## 2014-10-18 MED ORDER — DIAZEPAM 5 MG PO TABS: 5.0000 mg | ORAL_TABLET | Freq: Two times a day (BID) | ORAL | Status: DC

## 2014-10-18 NOTE — ED Provider Notes (Signed)
CSN: [id number]     Arrival date & time 10/18/14  1012 History  This chart was scribed for non-physician practitioner, Quincy Carnes, PA-C,working with Artis Delay, MD, by Marlowe Kays, ED Scribe. This patient was seen in room TR07C/TR07C and the patient's care was started at 10:38 AM.  Chief Complaint  Patient presents with  . Back Pain   HPI  HPI Comments:  Mary Stephens is a 58 y.o. female with PMH of HTN, Hepatitis C, DM and anxiety who presents to the Emergency Department complaining of right-sided neck tightness that radiates down into her right arm that began four days ago. Pt states she feels like she slept on it wrong. She has not taken anything to treat her pain. Moving her neck and right arm seem to exacerbate her pain. She denies any alleviating factors. Denies numbness, tingling or weakness of the RUE, HA, CP or SOB. Denies injury, trauma or fall. She reports h/o similar issues before relieved by muscle relaxer.  No fever, chills, diaphoresis, dizziness, lightheadedness, or feelings of syncope.  Past Medical History  Diagnosis Date  . Hypertension   . Hepatitis C   . Arthritis   . Anxiety   . Diabetes mellitus without complication    History reviewed. No pertinent past surgical history. No family history on file. History  Substance Use Topics  . Smoking status: Current Every Day Smoker  . Smokeless tobacco: Not on file     Comment: admits to cuttting back 4 cigarettes/day  . Alcohol Use: No   OB History    No data available     Review of Systems  Respiratory: Negative for shortness of breath.   Cardiovascular: Negative for chest pain.  Musculoskeletal: Positive for myalgias and neck pain.  Skin: Negative for wound.  Neurological: Negative for weakness, numbness and headaches.  All other systems reviewed and are negative.  Allergies  Review of patient's allergies indicates no known allergies.  Home Medications   Prior to Admission medications   Medication  Sig Start Date End Date Taking? Authorizing Provider  amLODipine (NORVASC) 10 MG tablet Take 1 tablet (10 mg total) by mouth every morning. 09/12/14   Lance Bosch, NP  gabapentin (NEURONTIN) 300 MG capsule Take 1 capsule (300 mg total) by mouth 3 (three) times daily. 09/12/14   Lance Bosch, NP  glipiZIDE (GLUCOTROL XL) 2.5 MG 24 hr tablet Take 1 tablet (2.5 mg total) by mouth daily with breakfast. 09/12/14   Lance Bosch, NP  glucose blood test strip Check BS twice daily, 250.00 09/12/14   Lance Bosch, NP  glucose monitoring kit (FREESTYLE) monitoring kit 1 each by Does not apply route as needed for other. Please check BS twice daily before meals, 250.00 09/12/14   Lance Bosch, NP  hydrochlorothiazide (MICROZIDE) 12.5 MG capsule Take 1 capsule (12.5 mg total) by mouth every morning. 09/12/14   Lance Bosch, NP  Lancets (FREESTYLE) lancets Check BS twice daily before meals, 250.00 09/12/14   Lance Bosch, NP  Ledipasvir-Sofosbuvir (HARVONI) 90-400 MG TABS Take 1 tablet by mouth daily. 09/03/14   Thayer Headings, MD  potassium chloride (K-DUR) 10 MEQ tablet Take 1 tablet (10 mEq total) by mouth daily. 10/17/14   Lance Bosch, NP  valACYclovir (VALTREX) 500 MG tablet Take 500 mg by mouth 2 (two) times daily.    Historical Provider, MD   Triage Vitals: BP 112/62 mmHg  Pulse 82  Temp(Src) 98.5 F (36.9 C)  Resp 165  SpO2 100% Physical Exam  Constitutional: She is oriented to person, place, and time. She appears well-developed and well-nourished.  HENT:  Head: Normocephalic and atraumatic.  Mouth/Throat: Oropharynx is clear and moist.  Eyes: Conjunctivae and EOM are normal. Pupils are equal, round, and reactive to light.  Neck: Normal range of motion. No rigidity.  No meningismus.  Cardiovascular: Normal rate, regular rhythm and normal heart sounds.   Pulmonary/Chest: Effort normal and breath sounds normal.  Abdominal: Soft. Bowel sounds are normal.  Musculoskeletal: Normal range of  motion. She exhibits tenderness. She exhibits no edema.       Cervical back: She exhibits pain and spasm.       Back:  Diffuse spasm of right trapezius. No midline tenderness of cervical or thoracic spine. Full ROM but with some pain.  Normal grip strength of right arm. Right arm neurovascularly intact.  Neurological: She is alert and oriented to person, place, and time.  AAOx3, answering questions appropriately; equal strength UE and LE bilaterally; CN grossly intact; moves all extremities appropriately without ataxia; no focal neuro deficits or facial asymmetry appreciated  Skin: Skin is warm and dry.  Psychiatric: She has a normal mood and affect.  Nursing note and vitals reviewed.   ED Course  Procedures (including critical care time) DIAGNOSTIC STUDIES: Oxygen Saturation is 100% on RA, normal by my interpretation.   COORDINATION OF CARE: 10:42 AM- Will prescribe muscle relaxer. Pt verbalizes understanding and agrees to plan.  Medications - No data to display  Labs Review Labs Reviewed - No data to display  Imaging Review No results found.   EKG Interpretation None      MDM   Final diagnoses:  Muscle spasms of neck   58 y.o. F with neck and right arm pain, thinks she slept on it wrong.  On exam, patient afebrile and non-toxic in appearance.  Diffuse muscle spasm of right trapezius with extension to right shoulder.  Full ROM of neck, but with some pain when turning head to right.  No nuchal rigidity.  Neurologic exam non-focal.  Do not feel her symptoms are due to meningitis, likely musculoskeletal.  She does have noted hx of recurrent muscle spasms.  Will start on naprosyn and valium-- states she has taken this combo in the past and worked well for her.  Discussed plan with patient, he/she acknowledged understanding and agreed with plan of care.  Return precautions given for new or worsening symptoms.  I personally performed the services described in this documentation,  which was scribed in my presence. The recorded information has been reviewed and is accurate.  Larene Pickett, PA-C 10/18/14 [place], MD 10/18/14 [phone]

## 2014-10-18 NOTE — ED Notes (Signed)
Back pain rt arm pain shooting started 4 daYS AGO NO INJURY

## 2014-10-18 NOTE — Discharge Instructions (Signed)
Take the prescribed medication as directed.  May wish to apply heat to affected areas to help with pain/soreness. Follow-up with your primary care physician. Return to the ED for new or worsening symptoms.

## 2014-10-23 ENCOUNTER — Telehealth: Payer: Self-pay | Admitting: *Deleted

## 2014-10-23 ENCOUNTER — Other Ambulatory Visit: Payer: Self-pay | Admitting: Internal Medicine

## 2014-10-23 MED ORDER — OMBITAS-PARITAPRE-RITONA-DASAB 12.5-75-50 &250 MG PO TBPK: 1.0000 | ORAL_TABLET | Freq: Two times a day (BID) | ORAL | Status: DC

## 2014-10-23 NOTE — Telephone Encounter (Signed)
Patient's home number not in service, left message with her aunt regarding follow up appointment, starting medications today. Asked her to call back and confirm. Landis Gandy, RN

## 2014-10-23 NOTE — Telephone Encounter (Signed)
-----   Message from Thayer Headings, MD sent at 10/23/2014  9:52 AM EST ----- Looks like she is getting her hep C meds today.  Could we get her scheduled with me the first of December (maybe around the 10th or so - 4 weeks after starting)? thanks

## 2014-11-12 ENCOUNTER — Other Ambulatory Visit: Payer: Medicaid Other

## 2014-11-12 NOTE — Progress Notes (Signed)
Patient ID: Mary Stephens, female   DOB: May 30, 1956, 58 y.o.   MRN: [id number]  CC: follow up  HPI:  Patient presents to clinic today for a follow up of DM.  She states that she continues to take her medications daily without any complications.  She only checks her blood sugar when she feels that she needs to.  She reports that she has been having increased concern over her neuropathy in her feet.  She would like a referral to podiatry.  No Known Allergies Past Medical History  Diagnosis Date  . Hypertension   . Hepatitis C   . Arthritis   . Anxiety   . Diabetes mellitus without complication    Current Outpatient Prescriptions on File Prior to Visit  Medication Sig Dispense Refill  . valACYclovir (VALTREX) 500 MG tablet Take 500 mg by mouth 2 (two) times daily.     No current facility-administered medications on file prior to visit.   No family history on file. History   Social History  . Marital Status: Single    Spouse Name: N/A    Number of Children: N/A  . Years of Education: N/A   Occupational History  . Not on file.   Social History Main Topics  . Smoking status: Current Every Day Smoker  . Smokeless tobacco: Not on file     Comment: admits to cuttting back 4 cigarettes/day  . Alcohol Use: No  . Drug Use: No  . Sexual Activity: Not on file   Other Topics Concern  . Not on file   Social History Narrative    Review of Systems  Neurological: Positive for tingling.      Objective:   Filed Vitals:   09/12/14 1135  BP: 149/95  Pulse: 88  Temp: 97.7 F (36.5 C)  Resp: 22    Physical Exam: Constitutional: Patient appears well-developed and well-nourished. No distress. Eyes: Conjunctivae and EOM are normal. PERRLA, no scleral icterus. CVS: RRR, S1/S2 +, no murmurs, no gallops, no carotid bruit.  Pulmonary: Effort and breath sounds normal, no stridor, rhonchi, wheezes, rales.  Musculoskeletal: Normal range of motion. No edema and no tenderness.   Lymphadenopathy: No lymphadenopathy noted, cervical, Neuro: Alert. Normal reflexes, muscle tone coordination.  Skin: Skin is warm and dry. No rash noted. Not diaphoretic. No erythema. No pallor. Psychiatric: Normal mood and affect. Behavior, judgment, thought content normal.  Lab Results  Component Value Date   WBC 7.4 06/28/2014   HGB 15.0 06/28/2014   HCT 42.6 06/28/2014   MCV 95.7 06/28/2014   PLT 206 06/28/2014   Lab Results  Component Value Date   CREATININE 1.11* 09/12/2014   BUN 18 09/12/2014   NA 141 09/12/2014   K 3.3* 09/12/2014   CL 104 09/12/2014   CO2 22 09/12/2014    Lab Results  Component Value Date   HGBA1C 5.7 09/12/2014   Lipid Panel     Component Value Date/Time   CHOL 171 07/01/2014 1023   TRIG 162* 07/01/2014 1023   HDL 39* 07/01/2014 1023   CHOLHDL 4.4 07/01/2014 1023   VLDL 32 07/01/2014 1023   LDLCALC 100* 07/01/2014 1023       Assessment and plan:   Mary Stephens was seen today for follow-up and diabetes.  Diagnoses and associated orders for this visit:  Type 2 diabetes mellitus without complication - glucose monitoring kit (FREESTYLE) monitoring kit; 1 each by Does not apply route as needed for other. Please check BS twice daily before meals,  250.00 - glucose blood test strip; Check BS twice daily, 250.00 - Lancets (FREESTYLE) lancets; Check BS twice daily before meals, 250.00 - POCT glucose (manual entry) - POCT glycosylated hemoglobin (Hb A1C) - glipiZIDE (GLUCOTROL XL) 2.5 MG 24 hr tablet; Take 1 tablet (2.5 mg total) by mouth daily with breakfast. - Ambulatory referral to Podiatry  Essential hypertension - amLODipine (NORVASC) 10 MG tablet; Take 1 tablet (10 mg total) by mouth every morning. - hydrochlorothiazide (MICROZIDE) 12.5 MG capsule; Take 1 capsule (12.5 mg total) by mouth every morning.  Explained to patient diet modifications that may contribute to elevated BP. Patient will avoid foods that are high in sodium such as  canned  soups and vegetables, tomato juice, commercial baked goods, commercially prepared frozen or canned entrees and sauces. Avoid salty snacks, added salt when cooking, and substituting for low sodium herbs or spices Explained exercise regimen of cardio at least three times weekly to help lower BP and cholesterol  Elevated serum creatinine - COMPLETE METABOLIC PANEL WITH GFR  Neuropathy - gabapentin (NEURONTIN) 300 MG capsule; Take 1 capsule (300 mg total) by mouth 3 (three) times daily. - Ambulatory referral to Podiatry    Return in about 3 months (around 12/13/2014) for Diabetes Mellitus.       Chari Manning, [facility] [phone] 11/12/2014, 11:22 AM

## 2014-11-18 ENCOUNTER — Other Ambulatory Visit: Payer: Self-pay | Admitting: Pharmacist Clinician (PhC)/ Clinical Pharmacy Specialist

## 2014-11-21 ENCOUNTER — Ambulatory Visit: Payer: Medicaid Other | Admitting: Internal Medicine

## 2014-11-29 ENCOUNTER — Other Ambulatory Visit: Payer: Self-pay | Admitting: Internal Medicine

## 2014-11-29 ENCOUNTER — Other Ambulatory Visit: Payer: Medicaid Other

## 2014-11-29 DIAGNOSIS — B182 Chronic viral hepatitis C: Secondary | ICD-10-CM

## 2014-11-29 LAB — CBC WITH DIFFERENTIAL/PLATELET
BASOS PCT: 0 % (ref 0–1)
Basophils Absolute: 0 10*3/uL (ref 0.0–0.1)
EOS ABS: 0.2 10*3/uL (ref 0.0–0.7)
Eosinophils Relative: 2 % (ref 0–5)
HCT: 38.4 % (ref 36.0–46.0)
Hemoglobin: 12.9 g/dL (ref 12.0–15.0)
LYMPHS ABS: 3 10*3/uL (ref 0.7–4.0)
Lymphocytes Relative: 38 % (ref 12–46)
MCH: 33.3 pg (ref 26.0–34.0)
MCHC: 33.6 g/dL (ref 30.0–36.0)
MCV: 99.2 fL (ref 78.0–100.0)
MPV: 11 fL (ref 9.4–12.4)
Monocytes Absolute: 0.8 10*3/uL (ref 0.1–1.0)
Monocytes Relative: 10 % (ref 3–12)
Neutro Abs: 4 10*3/uL (ref 1.7–7.7)
Neutrophils Relative %: 50 % (ref 43–77)
Platelets: 226 10*3/uL (ref 150–400)
RBC: 3.87 MIL/uL (ref 3.87–5.11)
RDW: 12.4 % (ref 11.5–15.5)
WBC: 8 10*3/uL (ref 4.0–10.5)

## 2014-12-02 LAB — HEPATITIS C RNA QUANTITATIVE: HCV Quantitative: <15 IU/mL (ref <15)

## 2014-12-11 ENCOUNTER — Other Ambulatory Visit: Payer: Self-pay | Admitting: Internal Medicine

## 2014-12-17 ENCOUNTER — Ambulatory Visit: Payer: Medicaid Other | Admitting: Internal Medicine

## 2014-12-17 ENCOUNTER — Telehealth: Payer: Self-pay | Admitting: *Deleted

## 2014-12-17 NOTE — Telephone Encounter (Signed)
Patient no-showed today's appointment.  Spoke with daughter, Theadora Rama.  Patient does not have a phone. Theadora Rama states she reminded her mother, but was surprised she did not come today.  She will drive by to check on her mother, will find out when she started the medication, will call back tomorrow to schedule an appointment. Per Dr. Linus Salmons, pt needs labs after she completes treatment (Hep C Viral load only) and can follow up with him 1 week later.  Landis Gandy, RN

## 2014-12-18 ENCOUNTER — Encounter (HOSPITAL_COMMUNITY): Payer: Self-pay | Admitting: *Deleted

## 2014-12-18 ENCOUNTER — Emergency Department (HOSPITAL_COMMUNITY): Payer: No Typology Code available for payment source

## 2014-12-18 ENCOUNTER — Encounter: Payer: Self-pay | Admitting: *Deleted

## 2014-12-18 ENCOUNTER — Emergency Department (HOSPITAL_COMMUNITY)
Admission: EM | Admit: 2014-12-18 | Discharge: 2014-12-18 | Disposition: A | Discharge status: Home / Self Care | Payer: No Typology Code available for payment source | Attending: Emergency Medicine | Admitting: Emergency Medicine

## 2014-12-18 DIAGNOSIS — Y92410 Unspecified street and highway as the place of occurrence of the external cause: Secondary | ICD-10-CM | POA: Diagnosis not present

## 2014-12-18 DIAGNOSIS — S8992XA Unspecified injury of left lower leg, initial encounter: Secondary | ICD-10-CM | POA: Insufficient documentation

## 2014-12-18 DIAGNOSIS — E119 Type 2 diabetes mellitus without complications: Secondary | ICD-10-CM | POA: Diagnosis not present

## 2014-12-18 DIAGNOSIS — Y9389 Activity, other specified: Secondary | ICD-10-CM | POA: Insufficient documentation

## 2014-12-18 DIAGNOSIS — Z720 Tobacco use: Secondary | ICD-10-CM | POA: Insufficient documentation

## 2014-12-18 DIAGNOSIS — Z8619 Personal history of other infectious and parasitic diseases: Secondary | ICD-10-CM | POA: Insufficient documentation

## 2014-12-18 DIAGNOSIS — M25562 Pain in left knee: Secondary | ICD-10-CM

## 2014-12-18 DIAGNOSIS — F419 Anxiety disorder, unspecified: Secondary | ICD-10-CM | POA: Diagnosis not present

## 2014-12-18 DIAGNOSIS — Z79899 Other long term (current) drug therapy: Secondary | ICD-10-CM | POA: Insufficient documentation

## 2014-12-18 DIAGNOSIS — Z791 Long term (current) use of non-steroidal anti-inflammatories (NSAID): Secondary | ICD-10-CM | POA: Insufficient documentation

## 2014-12-18 DIAGNOSIS — Y998 Other external cause status: Secondary | ICD-10-CM | POA: Diagnosis not present

## 2014-12-18 DIAGNOSIS — M1990 Unspecified osteoarthritis, unspecified site: Secondary | ICD-10-CM | POA: Insufficient documentation

## 2014-12-18 DIAGNOSIS — I10 Essential (primary) hypertension: Secondary | ICD-10-CM | POA: Diagnosis not present
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
## 2014-12-18 NOTE — Patient Instructions (Signed)
RN praised pt for taking medication responsibly and Hep C VL down to <15.  RN encouraged pt to continue taking the last 2 months of Hep C medication to complete therapy.

## 2014-12-18 NOTE — Progress Notes (Signed)
Patient ID: Mary Stephens, female   DOB: [birth year], 59 y.o.   MRN: [id number] Pt wanting to know Hep C lab results.  Printed results and reviewed with the pt.  RN praised pt for taking medication responsibly and Hep C VL down to <15.  RN encouraged pt to continue taking the last 2 months of Hep C medication to complete therapy.  Pt verbalized that she was pleased with results so far and would continue with taking the medication.  RN made the pt her lab and MD 3 month f/u appts.
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
## 2014-12-27 ENCOUNTER — Ambulatory Visit: Payer: Medicaid Other | Attending: Internal Medicine | Admitting: Internal Medicine

## 2014-12-27 ENCOUNTER — Telehealth: Payer: Self-pay | Admitting: *Deleted

## 2014-12-27 ENCOUNTER — Encounter: Payer: Self-pay | Admitting: Internal Medicine

## 2014-12-27 VITALS — BP 125/85 | HR 87 | Temp 97.6°F | Resp 16 | Ht 68.0 in | Wt 165.0 lb

## 2014-12-27 DIAGNOSIS — I10 Essential (primary) hypertension: Secondary | ICD-10-CM | POA: Insufficient documentation

## 2014-12-27 DIAGNOSIS — E119 Type 2 diabetes mellitus without complications: Secondary | ICD-10-CM | POA: Insufficient documentation

## 2014-12-27 DIAGNOSIS — Z041 Encounter for examination and observation following transport accident: Secondary | ICD-10-CM

## 2014-12-27 DIAGNOSIS — F17210 Nicotine dependence, cigarettes, uncomplicated: Secondary | ICD-10-CM | POA: Insufficient documentation

## 2014-12-27 DIAGNOSIS — M25562 Pain in left knee: Secondary | ICD-10-CM | POA: Insufficient documentation

## 2014-12-27 DIAGNOSIS — Z720 Tobacco use: Secondary | ICD-10-CM | POA: Diagnosis not present

## 2014-12-27 DIAGNOSIS — F419 Anxiety disorder, unspecified: Secondary | ICD-10-CM | POA: Insufficient documentation

## 2014-12-27 DIAGNOSIS — B1920 Unspecified viral hepatitis C without hepatic coma: Secondary | ICD-10-CM | POA: Diagnosis not present

## 2014-12-27 DIAGNOSIS — Z043 Encounter for examination and observation following other accident: Secondary | ICD-10-CM

## 2014-12-27 DIAGNOSIS — Z79899 Other long term (current) drug therapy: Secondary | ICD-10-CM | POA: Insufficient documentation

## 2014-12-27 DIAGNOSIS — F17200 Nicotine dependence, unspecified, uncomplicated: Secondary | ICD-10-CM

## 2014-12-27 LAB — POCT GLYCOSYLATED HEMOGLOBIN (HGB A1C): Hemoglobin A1C: 5.6

## 2014-12-27 LAB — GLUCOSE, POCT (MANUAL RESULT ENTRY): POC Glucose: 118 mg/dl — AB (ref 70–99)

## 2014-12-27 MED ORDER — DICLOFENAC SODIUM 1 % TD GEL: 2.0000 g | Freq: Four times a day (QID) | TRANSDERMAL | Status: DC

## 2014-12-27 NOTE — Telephone Encounter (Signed)
MRI appointment on Jan 14 2015 at 01:00 at [facility] arriving 15 min early Left appointment information with Mrs Mary Stephens
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
## 2015-01-04 ENCOUNTER — Encounter: Payer: Self-pay | Admitting: Internal Medicine

## 2015-01-11 ENCOUNTER — Other Ambulatory Visit: Payer: Self-pay | Admitting: Internal Medicine

## 2015-01-13 ENCOUNTER — Ambulatory Visit: Payer: Medicaid Other

## 2015-01-14 ENCOUNTER — Ambulatory Visit (HOSPITAL_COMMUNITY): Payer: Medicaid Other

## 2015-02-11 ENCOUNTER — Other Ambulatory Visit: Payer: Medicaid Other

## 2015-02-19 ENCOUNTER — Ambulatory Visit: Payer: Medicaid Other | Admitting: Internal Medicine

## 2015-03-20 ENCOUNTER — Telehealth: Payer: Self-pay | Admitting: Internal Medicine
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
## 2015-03-26 ENCOUNTER — Telehealth: Payer: Self-pay | Admitting: Internal Medicine

## 2015-03-26 NOTE — Telephone Encounter (Signed)
I called patient on 01/13/15 to canceled his mri because medicaid denied the procedure   Based on [employer] Musculoskeletal Imaging Guidelines, we are unable to approve the requested study. Guidelines support advanced imaging after failure of a physician supervised 6 week trial of conservative treatment which might include RICE, NSAIDS, narcotic and non-narcotic analgesic medication, oral or injectable corticosteroids, viscosupplementation injections, a physician directed home exercise program, cross-training, and/or physical therapy, or immobilization by splinting/casting/bracing. The clinical information provided does not meet these criteria and, therefore, the requested imaging is not indicated at this time.    The note is in the referral  Thanks

## 2015-03-26 NOTE — Telephone Encounter (Signed)
Patient came into office requesting MRI appointment.  Pt has medicaid and needs prior authorization for pt to have MRI done. Please f/u with pt

## 2015-04-30 ENCOUNTER — Telehealth: Payer: Self-pay | Admitting: Internal Medicine

## 2015-04-30 NOTE — Telephone Encounter (Signed)
Patient has come in today to request a referral for an MRI; Patient mentioned that she had an appointment in February but was cancelled because medicaid denied procedure; patient would like referral placed again; please f/u with patient

## 2015-05-14 ENCOUNTER — Ambulatory Visit: Payer: Medicaid Other | Attending: Internal Medicine | Admitting: Internal Medicine

## 2015-05-14 ENCOUNTER — Other Ambulatory Visit: Payer: Medicaid Other

## 2015-05-14 ENCOUNTER — Encounter: Payer: Self-pay | Admitting: Internal Medicine

## 2015-05-14 ENCOUNTER — Other Ambulatory Visit: Payer: Self-pay | Admitting: Internal Medicine

## 2015-05-14 VITALS — BP 144/96 | HR 85 | Temp 97.5°F | Resp 18 | Ht 68.0 in | Wt 166.0 lb

## 2015-05-14 DIAGNOSIS — I10 Essential (primary) hypertension: Secondary | ICD-10-CM | POA: Insufficient documentation

## 2015-05-14 DIAGNOSIS — E1142 Type 2 diabetes mellitus with diabetic polyneuropathy: Secondary | ICD-10-CM | POA: Insufficient documentation

## 2015-05-14 DIAGNOSIS — B1920 Unspecified viral hepatitis C without hepatic coma: Secondary | ICD-10-CM

## 2015-05-14 DIAGNOSIS — F419 Anxiety disorder, unspecified: Secondary | ICD-10-CM | POA: Insufficient documentation

## 2015-05-14 DIAGNOSIS — M25562 Pain in left knee: Secondary | ICD-10-CM | POA: Diagnosis not present

## 2015-05-14 LAB — BASIC METABOLIC PANEL
BUN: 20 mg/dL (ref 6–23)
CALCIUM: 9.4 mg/dL (ref 8.4–10.5)
CHLORIDE: 101 meq/L (ref 96–112)
CO2: 27 mEq/L (ref 19–32)
Creat: 1.31 mg/dL — ABNORMAL HIGH (ref 0.50–1.10)
Glucose, Bld: 92 mg/dL (ref 70–99)
Potassium: 3.4 mEq/L — ABNORMAL LOW (ref 3.5–5.3)
Sodium: 141 mEq/L (ref 135–145)

## 2015-05-14 LAB — POCT GLYCOSYLATED HEMOGLOBIN (HGB A1C): Hemoglobin A1C: 5.5

## 2015-05-14 LAB — GLUCOSE, POCT (MANUAL RESULT ENTRY): POC GLUCOSE: 142 mg/dL — AB (ref 70–99)

## 2015-05-14 MED ORDER — GLIPIZIDE ER 2.5 MG PO TB24: ORAL_TABLET | ORAL | Status: DC

## 2015-05-14 MED ORDER — HYDROCHLOROTHIAZIDE 12.5 MG PO CAPS: ORAL_CAPSULE | ORAL | Status: DC

## 2015-05-14 MED ORDER — AMLODIPINE BESYLATE 10 MG PO TABS: ORAL_TABLET | ORAL | Status: DC

## 2015-05-14 MED ORDER — GABAPENTIN 300 MG PO CAPS: 600.0000 mg | ORAL_CAPSULE | Freq: Three times a day (TID) | ORAL | Status: DC
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
## 2015-05-14 NOTE — Patient Instructions (Signed)
Please make sure you have the correct number on file for referral information  I have increased your gabapentin to 600mg  three times per day

## 2015-05-15 LAB — HEPATITIS C RNA QUANTITATIVE: HCV Quantitative: NOT DETECTED IU/mL (ref <15)

## 2015-05-19 ENCOUNTER — Telehealth: Payer: Self-pay | Admitting: Internal Medicine

## 2015-05-19 ENCOUNTER — Telehealth: Payer: Self-pay | Admitting: *Deleted

## 2015-05-19 NOTE — Telephone Encounter (Signed)
Patient came into office requesting medication refill on valACYclovir (VALTREX) 500 MG tablet. Patient was seen 05/14/15 and forgot to refill this medication. Patient would like medication sent to pharmacy on file, she has been out for 2 days . Please f/u with patient

## 2015-05-19 NOTE — Telephone Encounter (Signed)
Hep C viral load = not detected.  Pt very pleased with this result.

## 2015-05-20 ENCOUNTER — Other Ambulatory Visit: Payer: Self-pay | Admitting: Internal Medicine

## 2015-05-20 MED ORDER — VALACYCLOVIR HCL 500 MG PO TABS: 500.0000 mg | ORAL_TABLET | Freq: Two times a day (BID) | ORAL | Status: DC

## 2015-05-20 NOTE — Telephone Encounter (Signed)
This has been completed. Please inform patient

## 2015-05-23 ENCOUNTER — Other Ambulatory Visit: Payer: Self-pay | Admitting: Internal Medicine

## 2015-05-23 DIAGNOSIS — E876 Hypokalemia: Secondary | ICD-10-CM

## 2015-05-26 ENCOUNTER — Ambulatory Visit: Payer: Medicaid Other | Admitting: Family Medicine

## 2015-06-05 ENCOUNTER — Telehealth: Payer: Self-pay

## 2015-06-05 NOTE — Telephone Encounter (Signed)
Nurse called patients home number, reached voicemail. Left message for patient to call Mary Stephens at [facility], [phone]. Nurse called mobile number, reached recording explaining can not leave message.

## 2015-06-05 NOTE — Telephone Encounter (Signed)
-----   Message from Lance Bosch, NP sent at 05/23/2015 10:37 AM EDT ----- Kidney function is elevated again. No more NSAID's such as naproxen, ibuprofen, advil, aleve, etc. I also want her to stop the HCTZ BP medication. Have her come back in 3 weeks after stopping medication to have a CMP drawn(order placed already) and a BP check. Potassium slightly low so send her potassium 10 MEQ to daily once daily for 30 days. No refills.

## 2015-06-06 NOTE — Telephone Encounter (Signed)
Nurse called patient home number, reached voicemail. Left message for patient to call Mary Stephens with [facility], at [phone]. Nurse called mobile number, reached recording explaining can not leave message.

## 2015-06-17 NOTE — Telephone Encounter (Signed)
Nurse called patients home number, reached voicemail, left message for patient to return call to [facility] with [facility] at [phone]. Nurse called patients mobile listed, patients daughter answered. Daughter will see patient tomorrow.  Patients daughter agrees to return call to Nira Conn with [facility] at [phone] tomorrow evening when she visits patient.

## 2015-06-18 NOTE — Telephone Encounter (Signed)
Patient is returning phone call from nurse, please f/u with pt.

## 2015-06-23 NOTE — Telephone Encounter (Signed)
Patient returned call to nurse, patient verified date of birth. Patient is aware of elevated kidney function. Patient agrees not to take NSAIDs, such as naproxen, ibuprofen, aleve, and advil. Patient agrees to stop HCTZ and return to [facility] for lab visit and BP check with nurse in 3 weeks. Appointments have been scheduled with front office staff.  Patient agrees to pick up potassium supplement at pharmacy and start taking due to low potassium.  Patient voices understanding and has no further questions at this time.

## 2015-06-23 NOTE — Telephone Encounter (Signed)
Nurse called patient, reached patients daughter. Daughter will go to patient's house at 5:45pm and return call to nurse.

## 2015-07-18 ENCOUNTER — Other Ambulatory Visit: Payer: Medicaid Other

## 2015-07-20 ENCOUNTER — Encounter (HOSPITAL_COMMUNITY): Payer: Self-pay | Admitting: Emergency Medicine

## 2015-07-20 ENCOUNTER — Emergency Department (HOSPITAL_COMMUNITY)
Admission: EM | Admit: 2015-07-20 | Discharge: 2015-07-20 | Disposition: A | Discharge status: Home / Self Care | Payer: Medicaid Other | Attending: Emergency Medicine | Admitting: Emergency Medicine

## 2015-07-20 DIAGNOSIS — R220 Localized swelling, mass and lump, head: Secondary | ICD-10-CM | POA: Diagnosis present

## 2015-07-20 DIAGNOSIS — I10 Essential (primary) hypertension: Secondary | ICD-10-CM | POA: Insufficient documentation

## 2015-07-20 DIAGNOSIS — Z79899 Other long term (current) drug therapy: Secondary | ICD-10-CM | POA: Diagnosis not present

## 2015-07-20 DIAGNOSIS — T7840XA Allergy, unspecified, initial encounter: Secondary | ICD-10-CM

## 2015-07-20 DIAGNOSIS — M1990 Unspecified osteoarthritis, unspecified site: Secondary | ICD-10-CM | POA: Diagnosis not present

## 2015-07-20 DIAGNOSIS — F419 Anxiety disorder, unspecified: Secondary | ICD-10-CM | POA: Diagnosis not present

## 2015-07-20 DIAGNOSIS — Z8619 Personal history of other infectious and parasitic diseases: Secondary | ICD-10-CM | POA: Insufficient documentation

## 2015-07-20 DIAGNOSIS — L234 Allergic contact dermatitis due to dyes: Secondary | ICD-10-CM | POA: Insufficient documentation

## 2015-07-20 DIAGNOSIS — E119 Type 2 diabetes mellitus without complications: Secondary | ICD-10-CM | POA: Insufficient documentation

## 2015-07-20 DIAGNOSIS — Z720 Tobacco use: Secondary | ICD-10-CM | POA: Diagnosis not present

## 2015-07-20 DIAGNOSIS — R600 Localized edema: Secondary | ICD-10-CM

## 2015-07-20 MED ORDER — PREDNISONE 20 MG PO TABS: 40.0000 mg | ORAL_TABLET | Freq: Every day | ORAL | Status: DC

## 2015-07-20 MED ORDER — DEXAMETHASONE SODIUM PHOSPHATE 10 MG/ML IJ SOLN
10.0000 mg | Freq: Once | INTRAMUSCULAR | Status: AC
  Administered 2015-07-20: 10 mg via INTRAMUSCULAR
  Filled 2015-07-20: qty 1

## 2015-07-20 MED ORDER — DIPHENHYDRAMINE HCL 25 MG PO TABS: 25.0000 mg | ORAL_TABLET | Freq: Four times a day (QID) | ORAL | Status: DC

## 2015-07-20 NOTE — ED Notes (Addendum)
Pt co orbital edema since last Tuesday, per pt she applied hair color that day and got reaction right after color application. Pt denies vision changes nor pain. Also denies shortness of breath. Pt alert and oriented x 4.

## 2015-07-20 NOTE — Discharge Instructions (Signed)
Benadryl for itching and rash. Prednisone as prescribed until all gone, next dose tomorrow. Cool compresses.  Follow up with primary care doctor for recheck.   Contact Dermatitis Contact dermatitis is a reaction to certain substances that touch the skin. Contact dermatitis can be either irritant contact dermatitis or allergic contact dermatitis. Irritant contact dermatitis does not require previous exposure to the substance for a reaction to occur.Allergic contact dermatitis only occurs if you have been exposed to the substance before. Upon a repeat exposure, your body reacts to the substance.  CAUSES  Many substances can cause contact dermatitis. Irritant dermatitis is most commonly caused by repeated exposure to mildly irritating substances, such as:  Makeup.  Soaps.  Detergents.  Bleaches.  Acids.  Metal salts, such as nickel. Allergic contact dermatitis is most commonly caused by exposure to:  Poisonous plants.  Chemicals (deodorants, shampoos).  Jewelry.  Latex.  Neomycin in triple antibiotic cream.  Preservatives in products, including clothing. SYMPTOMS  The area of skin that is exposed may develop:  Dryness or flaking.  Redness.  Cracks.  Itching.  Pain or a burning sensation.  Blisters. With allergic contact dermatitis, there may also be swelling in areas such as the eyelids, mouth, or genitals.  DIAGNOSIS  Your caregiver can usually tell what the problem is by doing a physical exam. In cases where the cause is uncertain and an allergic contact dermatitis is suspected, a patch skin test may be performed to help determine the cause of your dermatitis. TREATMENT Treatment includes protecting the skin from further contact with the irritating substance by avoiding that substance if possible. Barrier creams, powders, and gloves may be helpful. Your caregiver may also recommend:  Steroid creams or ointments applied 2 times daily. For best results, soak the rash  area in cool water for 20 minutes. Then apply the medicine. Cover the area with a plastic wrap. You can store the steroid cream in the refrigerator for a "chilly" effect on your rash. That may decrease itching. Oral steroid medicines may be needed in more severe cases.  Antibiotics or antibacterial ointments if a skin infection is present.  Antihistamine lotion or an antihistamine taken by mouth to ease itching.  Lubricants to keep moisture in your skin.  Burow's solution to reduce redness and soreness or to dry a weeping rash. Mix one packet or tablet of solution in 2 cups cool water. Dip a clean washcloth in the mixture, wring it out a bit, and put it on the affected area. Leave the cloth in place for 30 minutes. Do this as often as possible throughout the day.  Taking several cornstarch or baking soda baths daily if the area is too large to cover with a washcloth. Harsh chemicals, such as alkalis or acids, can cause skin damage that is like a burn. You should flush your skin for 15 to 20 minutes with cold water after such an exposure. You should also seek immediate medical care after exposure. Bandages (dressings), antibiotics, and pain medicine may be needed for severely irritated skin.  HOME CARE INSTRUCTIONS  Avoid the substance that caused your reaction.  Keep the area of skin that is affected away from hot water, soap, sunlight, chemicals, acidic substances, or anything else that would irritate your skin.  Do not scratch the rash. Scratching may cause the rash to become infected.  You may take cool baths to help stop the itching.  Only take over-the-counter or prescription medicines as directed by your caregiver.  See your  caregiver for follow-up care as directed to make sure your skin is healing properly. SEEK MEDICAL CARE IF:   Your condition is not better after 3 days of treatment.  You seem to be getting worse.  You see signs of infection such as swelling, tenderness,  redness, soreness, or warmth in the affected area.  You have any problems related to your medicines. Document Released: 11/26/2000 Document Revised: 02/21/2012 Document Reviewed: 05/04/2011 [facility] Patient Information 2015 [place], [place]. This information is not intended to replace advice given to you by your health care provider. Make sure you discuss any questions you have with your health care provider.

## 2015-07-20 NOTE — ED Provider Notes (Signed)
CSN: [id number]     Arrival date & time 07/20/15  1147 History  This chart was scribed for Jeannett Senior, PA-C, working with Fredia Sorrow, MD by Starleen Arms, ED Scribe. This patient was seen in room WTR6/WTR6 and the patient's care was started at 12:12 PM.   Chief Complaint  Patient presents with  . Facial Swelling   The history is provided by the patient. No language interpreter was used.   HPI Comments: Mary Stephens is a 59 y.o. female with a hx of DM who presents to the Emergency Department complaining of non-painful, non-itching periorbital swelling onset 3 days ago.  There is also an associated rash on the neck, upper back, and scalp.  The complaint onset shortly after dying her hair and getting some of the dye in her eyes.  She flushed her eyes after the exposure.  Application of ice and heat afforded no relief.  Benadryl has not been tried.  She denies photophobia, vision changes, SOB.  Denies pain.   Past Medical History  Diagnosis Date  . Hypertension   . Hepatitis C   . Arthritis   . Anxiety   . Diabetes mellitus without complication    Past Surgical History  Procedure Laterality Date  . Partial hysterectomy  2001   Family History  Problem Relation Age of Onset  . Diabetes Mother    History  Substance Use Topics  . Smoking status: Current Every Day Smoker -- 0.25 packs/day    Types: Cigarettes  . Smokeless tobacco: Not on file     Comment: admits to cuttting back 4 cigarettes/day  . Alcohol Use: No   OB History    No data available     Review of Systems  Constitutional: Negative for fever.  HENT: Positive for facial swelling.   Eyes: Negative for photophobia, pain and visual disturbance.  Respiratory: Negative for shortness of breath.   Skin: Positive for rash.      Allergies  Review of patient's allergies indicates no known allergies.  Home Medications   Prior to Admission medications   Medication Sig Start Date End Date Taking? Authorizing  Provider  amLODipine (NORVASC) 10 MG tablet TAKE 1 TABLET (10 MG TOTAL) BY MOUTH EVERY MORNING. 05/14/15   Lance Bosch, NP  diclofenac sodium (VOLTAREN) 1 % GEL Apply 2 g topically 4 (four) times daily. Patient not taking: Reported on 05/14/2015 12/27/14   Lance Bosch, NP  gabapentin (NEURONTIN) 300 MG capsule Take 2 capsules (600 mg total) by mouth 3 (three) times daily. 05/14/15   Lance Bosch, NP  glipiZIDE (GLUCOTROL XL) 2.5 MG 24 hr tablet TAKE 1 TABLET (2.5 MG TOTAL) BY MOUTH DAILY WITH BREAKFAST. 05/14/15   Lance Bosch, NP  glucose blood test strip Check BS twice daily, 250.00 09/12/14   Lance Bosch, NP  glucose monitoring kit (FREESTYLE) monitoring kit 1 each by Does not apply route as needed for other. Please check BS twice daily before meals, 250.00 09/12/14   Lance Bosch, NP  hydrochlorothiazide (MICROZIDE) 12.5 MG capsule TAKE 1 CAPSULE (12.5 MG TOTAL) BY MOUTH EVERY MORNING. 05/14/15   Lance Bosch, NP  Lancets (FREESTYLE) lancets Check BS twice daily before meals, 250.00 09/12/14   Lance Bosch, NP  naproxen (NAPROSYN) 500 MG tablet Take 1 tablet (500 mg total) by mouth 2 (two) times daily with a meal. Patient not taking: Reported on 05/14/2015 10/18/14   Larene Pickett, PA-C  Ombitas-Paritapre-Ritona-Dasab Linzie Collin PAK)  12.5-75-50 &250 MG TBPK Take 1 Package by mouth 2 (two) times daily. Patient not taking: Reported on 05/14/2015 10/23/14   Thayer Headings, MD  potassium chloride (K-DUR) 10 MEQ tablet Take 1 tablet (10 mEq total) by mouth daily. Patient not taking: Reported on 05/14/2015 10/17/14   Lance Bosch, NP  valACYclovir (VALTREX) 500 MG tablet Take 1 tablet (500 mg total) by mouth 2 (two) times daily. 05/20/15   Lance Bosch, NP   BP 137/93 mmHg  Pulse 86  Temp(Src) 98 F (36.7 C) (Oral)  Resp 16  Ht 5' 9"  (1.753 m)  Wt 162 lb (73.483 kg)  BMI 23.91 kg/m2  SpO2 94% Physical Exam  Constitutional: She is oriented to person, place, and time. She appears well-developed  and well-nourished. No distress.  HENT:  Head: Normocephalic and atraumatic.  No swelling of the lips, tongue, throat  Eyes: Conjunctivae, EOM and lids are normal. Pupils are equal, round, and reactive to light. Lids are everted and swept, no foreign bodies found. Right conjunctiva is not injected. Left conjunctiva is not injected.  Neck: Neck supple. No tracheal deviation present.  Cardiovascular: Normal rate.   Pulmonary/Chest: Effort normal. No respiratory distress.  Musculoskeletal: Normal range of motion.  Neurological: She is alert and oriented to person, place, and time.  Skin: Skin is warm and dry.  Erythematous, papular rash to the hairline. Periorbital swelling, no erythema. No tenderness to palpation over periorbital area.   Psychiatric: She has a normal mood and affect. Her behavior is normal.  Nursing note and vitals reviewed.   ED Course  Procedures (including critical care time)  DIAGNOSTIC STUDIES: Oxygen Saturation is 94% on RA, adequate by my interpretation.    COORDINATION OF CARE:  12:17 PM Discussed treatment plan with patient at bedside.  Patient acknowledges and agrees with plan.    Labs Review Labs Reviewed - No data to display  Imaging Review No results found.   EKG Interpretation None      MDM   Final diagnoses:  Allergic reaction, initial encounter  Periorbital edema   Patient with a rash around the scalp area and the hairline.There is periorbital edema without erythema or tenderness. There is no eye pain or visual changes. Most likely allergic reaction to the dye. Patient is diabetic however well controlled, blood sugars in the low 100s. Will give Decadron IM and prednisone short burst for home. Advised to take benadryl. Cool compresses to the eyes. Follow-up with primary care doctor. There is no oral mucosal involvement, there is no swelling of the lips, tongue, throat. There is no difficulty breathing.  Filed Vitals:   07/20/15 1202  BP:  137/93  Pulse: 86  Temp: 98 F (36.7 C)  Resp: 16    I personally performed the services described in this documentation, which was scribed in my presence. The recorded information has been reviewed and is accurate.   Jeannett Senior, PA-C 07/20/15 [place], MD 07/24/15 [phone]

## 2015-09-19 ENCOUNTER — Other Ambulatory Visit: Payer: Self-pay | Admitting: Internal Medicine

## 2015-09-22 ENCOUNTER — Ambulatory Visit: Payer: Medicaid Other | Attending: Internal Medicine | Admitting: Internal Medicine

## 2015-09-22 ENCOUNTER — Encounter: Payer: Self-pay | Admitting: Internal Medicine

## 2015-09-22 VITALS — BP 142/92 | HR 98 | Temp 98.8°F | Resp 16 | Ht 69.0 in | Wt 173.0 lb

## 2015-09-22 DIAGNOSIS — F329 Major depressive disorder, single episode, unspecified: Secondary | ICD-10-CM

## 2015-09-22 DIAGNOSIS — Z79899 Other long term (current) drug therapy: Secondary | ICD-10-CM | POA: Diagnosis not present

## 2015-09-22 DIAGNOSIS — Z833 Family history of diabetes mellitus: Secondary | ICD-10-CM | POA: Diagnosis not present

## 2015-09-22 DIAGNOSIS — E119 Type 2 diabetes mellitus without complications: Secondary | ICD-10-CM

## 2015-09-22 DIAGNOSIS — F17210 Nicotine dependence, cigarettes, uncomplicated: Secondary | ICD-10-CM | POA: Diagnosis not present

## 2015-09-22 DIAGNOSIS — Z720 Tobacco use: Secondary | ICD-10-CM | POA: Diagnosis not present

## 2015-09-22 DIAGNOSIS — Z7984 Long term (current) use of oral hypoglycemic drugs: Secondary | ICD-10-CM | POA: Insufficient documentation

## 2015-09-22 DIAGNOSIS — I10 Essential (primary) hypertension: Secondary | ICD-10-CM | POA: Diagnosis not present

## 2015-09-22 DIAGNOSIS — F17200 Nicotine dependence, unspecified, uncomplicated: Secondary | ICD-10-CM

## 2015-09-22 DIAGNOSIS — F32A Depression, unspecified: Secondary | ICD-10-CM

## 2015-09-22 LAB — GLUCOSE, POCT (MANUAL RESULT ENTRY): POC Glucose: 107 mg/dl — AB (ref 70–99)

## 2015-09-22 LAB — POCT GLYCOSYLATED HEMOGLOBIN (HGB A1C): HEMOGLOBIN A1C: 5.8

## 2015-09-22 NOTE — Patient Instructions (Signed)
I will have someone try to find you a periodontist soon.  Smoking Cessation, Tips for Success If you are ready to quit smoking, congratulations! You have chosen to help yourself be healthier. Cigarettes bring nicotine, tar, carbon monoxide, and other irritants into your body. Your lungs, heart, and blood vessels will be able to work better without these poisons. There are many different ways to quit smoking. Nicotine gum, nicotine patches, a nicotine inhaler, or nicotine nasal spray can help with physical craving. Hypnosis, support groups, and medicines help break the habit of smoking. WHAT THINGS CAN I DO TO MAKE QUITTING EASIER?  Here are some tips to help you quit for good:  Pick a date when you will quit smoking completely. Tell all of your friends and family about your plan to quit on that date.  Do not try to slowly cut down on the number of cigarettes you are smoking. Pick a quit date and quit smoking completely starting on that day.  Throw away all cigarettes.   Clean and remove all ashtrays from your home, work, and car.  On a card, write down your reasons for quitting. Carry the card with you and read it when you get the urge to smoke.  Cleanse your body of nicotine. Drink enough water and fluids to keep your urine clear or pale yellow. Do this after quitting to flush the nicotine from your body.  Learn to predict your moods. Do not let a bad situation be your excuse to have a cigarette. Some situations in your life might tempt you into wanting a cigarette.  Never have "just one" cigarette. It leads to wanting another and another. Remind yourself of your decision to quit.  Change habits associated with smoking. If you smoked while driving or when feeling stressed, try other activities to replace smoking. Stand up when drinking your coffee. Brush your teeth after eating. Sit in a different chair when you read the paper. Avoid alcohol while trying to quit, and try to drink fewer  caffeinated beverages. Alcohol and caffeine may urge you to smoke.  Avoid foods and drinks that can trigger a desire to smoke, such as sugary or spicy foods and alcohol.  Ask people who smoke not to smoke around you.  Have something planned to do right after eating or having a cup of coffee. For example, plan to take a walk or exercise.  Try a relaxation exercise to calm you down and decrease your stress. Remember, you may be tense and nervous for the first 2 weeks after you quit, but this will pass.  Find new activities to keep your hands busy. Play with a pen, coin, or rubber band. Doodle or draw things on paper.  Brush your teeth right after eating. This will help cut down on the craving for the taste of tobacco after meals. You can also try mouthwash.   Use oral substitutes in place of cigarettes. Try using lemon drops, carrots, cinnamon sticks, or chewing gum. Keep them handy so they are available when you have the urge to smoke.  When you have the urge to smoke, try deep breathing.  Designate your home as a nonsmoking area.  If you are a heavy smoker, ask your health care provider about a prescription for nicotine chewing gum. It can ease your withdrawal from nicotine.  Reward yourself. Set aside the cigarette money you save and buy yourself something nice.  Look for support from others. Join a support group or smoking cessation program. Ask  someone at home or at work to help you with your plan to quit smoking.  Always ask yourself, "Do I need this cigarette or is this just a reflex?" Tell yourself, "Today, I choose not to smoke," or "I do not want to smoke." You are reminding yourself of your decision to quit.  Do not replace cigarette smoking with electronic cigarettes (commonly called e-cigarettes). The safety of e-cigarettes is unknown, and some may contain harmful chemicals.  If you relapse, do not give up! Plan ahead and think about what you will do the next time you get  the urge to smoke. HOW WILL I FEEL WHEN I QUIT SMOKING? You may have symptoms of withdrawal because your body is used to nicotine (the addictive substance in cigarettes). You may crave cigarettes, be irritable, feel very hungry, cough often, get headaches, or have difficulty concentrating. The withdrawal symptoms are only temporary. They are strongest when you first quit but will go away within 10-14 days. When withdrawal symptoms occur, stay in control. Think about your reasons for quitting. Remind yourself that these are signs that your body is healing and getting used to being without cigarettes. Remember that withdrawal symptoms are easier to treat than the major diseases that smoking can cause.  Even after the withdrawal is over, expect periodic urges to smoke. However, these cravings are generally short lived and will go away whether you smoke or not. Do not smoke! WHAT RESOURCES ARE AVAILABLE TO HELP ME QUIT SMOKING? Your health care provider can direct you to community resources or hospitals for support, which may include:  Group support.  Education.  Hypnosis.  Therapy.   This information is not intended to replace advice given to you by your health care provider. Make sure you discuss any questions you have with your health care provider.   Document Released: 08/27/2004 Document Revised: 12/20/2014 Document Reviewed: 05/17/2013 Elsevier Interactive Patient Education [employer].

## 2015-09-22 NOTE — Progress Notes (Signed)
Patient states she is here for her routine follow up and  Also mentioned some paperwork she will need filled out Patient stated she has been feeling really good Patient had declined the flu vaccine

## 2015-09-22 NOTE — Progress Notes (Signed)
Patient ID: Mary Stephens, female   DOB: 1956/03/26, 59 y.o.   MRN: [id number]  CC: DM/ HTN f/u  HPI: Mary Stephens is a 59 y.o. female here today for a follow up visit.  Patient has past medical history of T2DM, HTN, Hep C, depression. Patient reports that she takes her glipizide daily without complications or hypoglycemic events. She checks her sugars sporidially. She denies neuropathy, polyuria, blurred vision. She does not check her blood pressures outside of clinic but denies headache, chest pain, palpitations, or edema. She reports that she recently smoked a cigarette and feels likme it is causing her BP to be elevated. She took her BP medication at 11 am today.  She states that she has been to [facility] and is due for a counseling session next week. She states that she has been becoming more depressed recently and decided to finally seek treatment. She denies SI.   No Known Allergies Past Medical History  Diagnosis Date  . Hypertension   . Hepatitis C   . Arthritis   . Anxiety   . Diabetes mellitus without complication [facility])    Current Outpatient Prescriptions on File Prior to Visit  Medication Sig Dispense Refill  . amLODipine (NORVASC) 10 MG tablet TAKE 1 TABLET (10 MG TOTAL) BY MOUTH EVERY MORNING. 30 tablet 3  . diphenhydrAMINE (BENADRYL) 25 MG tablet Take 1 tablet (25 mg total) by mouth every 6 (six) hours. 20 tablet 0  . gabapentin (NEURONTIN) 300 MG capsule Take 2 capsules (600 mg total) by mouth 3 (three) times daily. 180 capsule 3  . glipiZIDE (GLUCOTROL XL) 2.5 MG 24 hr tablet TAKE 1 TABLET (2.5 MG TOTAL) BY MOUTH DAILY WITH BREAKFAST. 30 tablet 3  . hydrochlorothiazide (MICROZIDE) 12.5 MG capsule TAKE 1 CAPSULE (12.5 MG TOTAL) BY MOUTH EVERY MORNING. 30 capsule 3  . valACYclovir (VALTREX) 500 MG tablet Take 1 tablet (500 mg total) by mouth 2 (two) times daily. 30 tablet 1  . diclofenac sodium (VOLTAREN) 1 % GEL Apply 2 g topically 4 (four) times daily. (Patient not taking:  Reported on 05/14/2015) 100 g 2  . glucose blood test strip Check BS twice daily, 250.00 100 each 12  . glucose monitoring kit (FREESTYLE) monitoring kit 1 each by Does not apply route as needed for other. Please check BS twice daily before meals, 250.00 1 each 0  . Lancets (FREESTYLE) lancets Check BS twice daily before meals, 250.00 100 each 12  . naproxen (NAPROSYN) 500 MG tablet Take 1 tablet (500 mg total) by mouth 2 (two) times daily with a meal. (Patient not taking: Reported on 05/14/2015) 30 tablet 0  . Ombitas-Paritapre-Ritona-Dasab (VIEKIRA PAK) 12.5-75-50 &250 MG TBPK Take 1 Package by mouth 2 (two) times daily. (Patient not taking: Reported on 05/14/2015) 112 each 2  . potassium chloride (K-DUR) 10 MEQ tablet Take 1 tablet (10 mEq total) by mouth daily. (Patient not taking: Reported on 05/14/2015) 30 tablet 0  . predniSONE (DELTASONE) 20 MG tablet Take 2 tablets (40 mg total) by mouth daily. 10 tablet 0   No current facility-administered medications on file prior to visit.   Family History  Problem Relation Age of Onset  . Diabetes Mother    Social History   Social History  . Marital Status: Single    Spouse Name: N/A  . Number of Children: N/A  . Years of Education: N/A   Occupational History  . Not on file.   Social History Main Topics  . Smoking  status: Current Every Day Smoker -- 0.25 packs/day    Types: Cigarettes  . Smokeless tobacco: Not on file     Comment: admits to cuttting back 4 cigarettes/day  . Alcohol Use: No  . Drug Use: No  . Sexual Activity: Not on file   Other Topics Concern  . Not on file   Social History Narrative    Review of Systems: Other than what is stated in HPI, all other systems are negative.   Objective:   Filed Vitals:   09/22/15 1630  BP: 142/97  Pulse: 98  Temp: 98.8 F (37.1 C)  Resp: 16    Physical Exam  Constitutional: She is oriented to person, place, and time.  Neck: No JVD present.  Cardiovascular: Normal rate,  regular rhythm and normal heart sounds.   Pulmonary/Chest: Effort normal and breath sounds normal.  Musculoskeletal: She exhibits no edema.  Neurological: She is alert and oriented to person, place, and time.  Skin: Skin is warm and dry.  Psychiatric: She has a normal mood and affect.     Lab Results  Component Value Date   WBC 8.0 11/29/2014   HGB 12.9 11/29/2014   HCT 38.4 11/29/2014   MCV 99.2 11/29/2014   PLT 226 11/29/2014   Lab Results  Component Value Date   CREATININE 1.31* 05/14/2015   BUN 20 05/14/2015   NA 141 05/14/2015   K 3.4* 05/14/2015   CL 101 05/14/2015   CO2 27 05/14/2015    Lab Results  Component Value Date   HGBA1C 5.80 09/22/2015   Lipid Panel     Component Value Date/Time   CHOL 171 07/01/2014 1023   TRIG 162* 07/01/2014 1023   HDL 39* 07/01/2014 1023   CHOLHDL 4.4 07/01/2014 1023   VLDL 32 07/01/2014 1023   LDLCALC 100* 07/01/2014 1023       Assessment and plan:   [name] was seen today for follow-up.  Diagnoses and all orders for this visit:  Type 2 diabetes mellitus without complication, without long-term current use of insulin (HCC) -     Glucose (CBG) -     HgB A1c Patients diabetes is well control as evidence by consistently low a1c.  Patient will continue with current therapy and continue to make necessary lifestyle changes.  Reviewed foot care, diet, exercise, annual health maintenance with patient.  Congratulated patient on beginning exercise regimen.   Essential hypertension BP is elevated today but has been normal on other visits. I will continue patient on current medication and follow up as needed. DASH diet advised and smoking cessation discussed  Depression I have praised patient on seeking help with [facility] and explained that it is a process and may take time.   Smoking Smoking cessation discussed for 3 minutes, patient is not willing to quit at this time. Will continue to assess on each visit. Discussed increased risk  for diseases such as cancer, heart disease, and stroke.    Return in about 6 months (around 03/22/2016) for DM/HTN and 1 week lab visit.       Lance Bosch, [facility] [phone] 09/22/2015, 4:59 PM

## 2015-09-23 NOTE — Telephone Encounter (Signed)
Patient called requesting a medication refill request for, Amlodipine and Glipizide Patient requested that the medicine be sent to [facility] on [address]. Please follow up with patient

## 2015-09-29 ENCOUNTER — Ambulatory Visit: Payer: Medicaid Other | Attending: Internal Medicine

## 2015-09-29 ENCOUNTER — Encounter (HOSPITAL_BASED_OUTPATIENT_CLINIC_OR_DEPARTMENT_OTHER): Payer: Medicaid Other | Admitting: Clinical

## 2015-09-29 ENCOUNTER — Other Ambulatory Visit: Payer: Self-pay | Admitting: Internal Medicine

## 2015-09-29 ENCOUNTER — Other Ambulatory Visit: Payer: Self-pay

## 2015-09-29 ENCOUNTER — Encounter (HOSPITAL_COMMUNITY): Payer: Self-pay | Admitting: Family Medicine

## 2015-09-29 ENCOUNTER — Emergency Department (HOSPITAL_COMMUNITY): Payer: Medicaid Other

## 2015-09-29 ENCOUNTER — Emergency Department (HOSPITAL_COMMUNITY)
Admission: EM | Admit: 2015-09-29 | Discharge: 2015-09-29 | Disposition: A | Discharge status: Home / Self Care | Payer: Medicaid Other | Attending: Emergency Medicine | Admitting: Emergency Medicine

## 2015-09-29 DIAGNOSIS — F329 Major depressive disorder, single episode, unspecified: Secondary | ICD-10-CM

## 2015-09-29 DIAGNOSIS — E1142 Type 2 diabetes mellitus with diabetic polyneuropathy: Secondary | ICD-10-CM

## 2015-09-29 DIAGNOSIS — I10 Essential (primary) hypertension: Secondary | ICD-10-CM

## 2015-09-29 DIAGNOSIS — Z79899 Other long term (current) drug therapy: Secondary | ICD-10-CM | POA: Insufficient documentation

## 2015-09-29 DIAGNOSIS — E876 Hypokalemia: Secondary | ICD-10-CM

## 2015-09-29 DIAGNOSIS — F32A Depression, unspecified: Secondary | ICD-10-CM

## 2015-09-29 DIAGNOSIS — M1990 Unspecified osteoarthritis, unspecified site: Secondary | ICD-10-CM | POA: Insufficient documentation

## 2015-09-29 DIAGNOSIS — Z7952 Long term (current) use of systemic steroids: Secondary | ICD-10-CM | POA: Insufficient documentation

## 2015-09-29 DIAGNOSIS — E119 Type 2 diabetes mellitus without complications: Secondary | ICD-10-CM | POA: Diagnosis not present

## 2015-09-29 DIAGNOSIS — Z8619 Personal history of other infectious and parasitic diseases: Secondary | ICD-10-CM | POA: Diagnosis not present

## 2015-09-29 DIAGNOSIS — M79601 Pain in right arm: Secondary | ICD-10-CM | POA: Insufficient documentation

## 2015-09-29 DIAGNOSIS — F419 Anxiety disorder, unspecified: Secondary | ICD-10-CM | POA: Diagnosis not present

## 2015-09-29 DIAGNOSIS — Z720 Tobacco use: Secondary | ICD-10-CM | POA: Insufficient documentation

## 2015-09-29 DIAGNOSIS — R0602 Shortness of breath: Secondary | ICD-10-CM | POA: Diagnosis present

## 2015-09-29 LAB — LIPID PANEL
Cholesterol: 220 mg/dL — ABNORMAL HIGH (ref 125–200)
HDL: 45 mg/dL — AB (ref >=46)
LDL CALC: 122 mg/dL (ref <130)
TRIGLYCERIDES: 263 mg/dL — AB (ref <150)
Total CHOL/HDL Ratio: 4.9 Ratio (ref <=5.0)
VLDL: 53 mg/dL — ABNORMAL HIGH (ref <30)

## 2015-09-29 LAB — I-STAT TROPONIN, ED: Troponin i, poc: 0 ng/mL (ref 0.00–0.08)

## 2015-09-29 LAB — COMPLETE METABOLIC PANEL WITH GFR
ALBUMIN: 4.3 g/dL (ref 3.6–5.1)
ALK PHOS: 97 U/L (ref 33–130)
ALT: 15 U/L (ref 6–29)
AST: 18 U/L (ref 10–35)
BILIRUBIN TOTAL: 0.5 mg/dL (ref 0.2–1.2)
BUN: 15 mg/dL (ref 7–25)
CO2: 27 mmol/L (ref 20–31)
Calcium: 9.8 mg/dL (ref 8.6–10.4)
Chloride: 106 mmol/L (ref 98–110)
Creat: 1.05 mg/dL — ABNORMAL HIGH (ref 0.50–0.99)
GFR, EST NON AFRICAN AMERICAN: 57 mL/min — AB (ref >=60)
GFR, Est African American: 66 mL/min (ref >=60)
Glucose, Bld: 82 mg/dL (ref 65–99)
POTASSIUM: 3.7 mmol/L (ref 3.5–5.3)
SODIUM: 143 mmol/L (ref 135–146)
TOTAL PROTEIN: 7.8 g/dL (ref 6.1–8.1)

## 2015-09-29 LAB — BASIC METABOLIC PANEL
ANION GAP: 10 (ref 5–15)
BUN: 14 mg/dL (ref 6–20)
CHLORIDE: 106 mmol/L (ref 101–111)
CO2: 24 mmol/L (ref 22–32)
Calcium: 9.6 mg/dL (ref 8.9–10.3)
Creatinine, Ser: 1.02 mg/dL — ABNORMAL HIGH (ref 0.44–1.00)
GFR calc Af Amer: >60 mL/min (ref >60)
GFR, EST NON AFRICAN AMERICAN: 58 mL/min — AB (ref >60)
GLUCOSE: 81 mg/dL (ref 65–99)
POTASSIUM: 3.5 mmol/L (ref 3.5–5.1)
Sodium: 140 mmol/L (ref 135–145)

## 2015-09-29 LAB — CBC
HEMATOCRIT: 42.4 % (ref 36.0–46.0)
HEMOGLOBIN: 14.5 g/dL (ref 12.0–15.0)
MCH: 32.9 pg (ref 26.0–34.0)
MCHC: 34.2 g/dL (ref 30.0–36.0)
MCV: 96.1 fL (ref 78.0–100.0)
Platelets: 226 10*3/uL (ref 150–400)
RBC: 4.41 MIL/uL (ref 3.87–5.11)
RDW: 13 % (ref 11.5–15.5)
WBC: 7.8 10*3/uL (ref 4.0–10.5)

## 2015-09-29 MED ORDER — GLIPIZIDE ER 2.5 MG PO TB24: ORAL_TABLET | ORAL | Status: DC

## 2015-09-29 MED ORDER — ACETAMINOPHEN 500 MG PO TABS
1000.0000 mg | ORAL_TABLET | Freq: Once | ORAL | Status: AC
  Administered 2015-09-29: 1000 mg via ORAL
  Filled 2015-09-29: qty 2

## 2015-09-29 MED ORDER — AMLODIPINE BESYLATE 10 MG PO TABS: ORAL_TABLET | ORAL | Status: DC

## 2015-09-29 MED ORDER — GABAPENTIN 300 MG PO CAPS: 600.0000 mg | ORAL_CAPSULE | Freq: Three times a day (TID) | ORAL | Status: DC

## 2015-09-29 MED ORDER — HYDROCHLOROTHIAZIDE 12.5 MG PO CAPS: ORAL_CAPSULE | ORAL | Status: DC

## 2015-09-29 NOTE — ED Notes (Signed)
Pt not in room.

## 2015-09-29 NOTE — ED Notes (Signed)
Pt. Left with all belongings and refused wheelchair. Discharge instructions were reviewed and all questions were answered.  

## 2015-09-29 NOTE — Progress Notes (Signed)
ASSESSMENT: Pt currently experiencing symptoms of depression. Pt needs to f/u with PCP and [facility]; would benefit from psychoeducation and supportive counseling regarding coping with symptoms of depression, along with community resources.  Stage of Change: contemplative  PLAN: 1. F/U with behavioral health consultant in as needed 2. Psychiatric Medications: none 3. Behavioral recommendation(s):   -Obtain a [place] bus [place] for reduced-fare rides -Contact Medicaid transportation number to set up rides for medical appointments -Accept [employer] service referral, if accepted -Go to [facility] on Thursday at [place] support group at [employer] -Consider reading educational materials regarding coping with symptoms of depression SUBJECTIVE: Pt. referred by self for psychosocial problems:  Pt. reports the following symptoms/concerns: Pt states that she does not currently have a phone, that she does not receive enough money every month in food stamps ($82/month), that she does receive disability ($730/month) for her depression, but is starting to feel "a little" depressed lately because of her health. She is interested in the resources at [facility], and might like to attend some of their classes; would also like to find part-time work and be able to afford transportation to her medical appointments. Duration of problem: at least one week increase in depression, longer for psychosocial problems Severity: moderate  OBJECTIVE: Orientation & Cognition: Oriented x3. Thought processes normal and appropriate to situation. Mood: appropriate. Affect: appropriate Appearance: appropriate Risk of harm to self or others: no risk of harm to self or others Substance use: none Assessments administered: none, will do at next visit  Diagnosis: Depression CPT Code: F32.9 -------------------------------------------- Other(s) present in the room:  none  Time spent with patient in exam room: 30 minutes

## 2015-09-29 NOTE — ED Provider Notes (Signed)
CSN: [id number]     Arrival date & time 09/29/15  1540 History   First MD Initiated Contact with Patient 09/29/15 1858     Chief Complaint  Patient presents with  . Arm Pain  . Shortness of Breath     (Consider location/radiation/quality/duration/timing/severity/associated sxs/prior Treatment) HPI Comments: Patient presents with weakness. She states that she has a history of diabetes and hypertension. She states that she's been out of her medicines for 6 days. She called her doctor and she thinks that they might have called in a refill but she hasn't checked on it today. She feels like she's feeling bad because her medicines are not in her system. She was previously having some pain in her right shoulder. She states her hurts when she moves around or tries to raise her arm above her head. She states that it's eased off now and is not hurting. She had some shortness of breath 2 nights ago at night but she denies any current shortness of breath. She denies any chest pain or tightness. There is no nausea vomiting or dizziness. She has a mild bifrontal-type headache. She denies any vision changes. She denies any ataxia. Patient is requesting to talk with the social worker.  Patient is a 59 y.o. female presenting with arm pain and shortness of breath.  Arm Pain Associated symptoms include shortness of breath. Pertinent negatives include no chest pain, no abdominal pain and no headaches.  Shortness of Breath Associated symptoms: no abdominal pain, no chest pain, no cough, no diaphoresis, no fever, no headaches, no rash and no vomiting     Past Medical History  Diagnosis Date  . Hypertension   . Hepatitis C   . Arthritis   . Anxiety   . Diabetes mellitus without complication [facility])    Past Surgical History  Procedure Laterality Date  . Partial hysterectomy  2001   Family History  Problem Relation Age of Onset  . Diabetes Mother    Social History  Substance Use Topics  . Smoking status:  Current Every Day Smoker -- 0.25 packs/day    Types: Cigarettes  . Smokeless tobacco: None     Comment: admits to cuttting back 4 cigarettes/day  . Alcohol Use: No   OB History    No data available     Review of Systems  Constitutional: Positive for fatigue. Negative for fever, chills and diaphoresis.  HENT: Negative for congestion, rhinorrhea and sneezing.   Eyes: Negative.   Respiratory: Positive for shortness of breath. Negative for cough and chest tightness.   Cardiovascular: Negative for chest pain and leg swelling.  Gastrointestinal: Negative for nausea, vomiting, abdominal pain, diarrhea and blood in stool.  Genitourinary: Negative for frequency, hematuria, flank pain and difficulty urinating.  Musculoskeletal: Positive for arthralgias. Negative for back pain.  Skin: Negative for rash.  Neurological: Negative for dizziness, speech difficulty, weakness, numbness and headaches.      Allergies  Review of patient's allergies indicates no known allergies.  Home Medications   Prior to Admission medications   Medication Sig Start Date End Date Taking? Authorizing Provider  amLODipine (NORVASC) 10 MG tablet TAKE 1 TABLET (10 MG TOTAL) BY MOUTH EVERY MORNING. 09/29/15  Yes Lance Bosch, NP  gabapentin (NEURONTIN) 300 MG capsule Take 2 capsules (600 mg total) by mouth 3 (three) times daily. Patient taking differently: Take 600 mg by mouth daily as needed (for nerve pains in feet, only PRN).  09/29/15  Yes Lance Bosch, NP  glipiZIDE (  GLUCOTROL XL) 2.5 MG 24 hr tablet TAKE 1 TABLET (2.5 MG TOTAL) BY MOUTH DAILY WITH BREAKFAST. 09/29/15  Yes Lance Bosch, NP  hydrochlorothiazide (MICROZIDE) 12.5 MG capsule TAKE 1 CAPSULE (12.5 MG TOTAL) BY MOUTH EVERY MORNING. 09/29/15  Yes Lance Bosch, NP  valACYclovir (VALTREX) 500 MG tablet Take 1 tablet (500 mg total) by mouth 2 (two) times daily. Patient taking differently: Take 500 mg by mouth.  05/20/15  Yes Lance Bosch, NP   diclofenac sodium (VOLTAREN) 1 % GEL Apply 2 g topically 4 (four) times daily. Patient not taking: Reported on 05/14/2015 12/27/14   Lance Bosch, NP  diphenhydrAMINE (BENADRYL) 25 MG tablet Take 1 tablet (25 mg total) by mouth every 6 (six) hours. 07/20/15   Tatyana Kirichenko, PA-C  glucose blood test strip Check BS twice daily, 250.00 09/12/14   Lance Bosch, NP  glucose monitoring kit (FREESTYLE) monitoring kit 1 each by Does not apply route as needed for other. Please check BS twice daily before meals, 250.00 09/12/14   Lance Bosch, NP  Lancets (FREESTYLE) lancets Check BS twice daily before meals, 250.00 09/12/14   Lance Bosch, NP  naproxen (NAPROSYN) 500 MG tablet Take 1 tablet (500 mg total) by mouth 2 (two) times daily with a meal. Patient not taking: Reported on 05/14/2015 10/18/14   Larene Pickett, PA-C  [place]-Paritapre-Ritona-Dasab (VIEKIRA PAK) 12.5-75-50 &250 MG TBPK Take 1 Package by mouth 2 (two) times daily. Patient not taking: Reported on 05/14/2015 10/23/14   Thayer Headings, MD  potassium chloride (K-DUR) 10 MEQ tablet Take 1 tablet (10 mEq total) by mouth daily. Patient not taking: Reported on 05/14/2015 10/17/14   Lance Bosch, NP  predniSONE (DELTASONE) 20 MG tablet Take 2 tablets (40 mg total) by mouth daily. 07/20/15   Tatyana Kirichenko, PA-C   BP 144/96 mmHg  Pulse 77  Temp(Src) 98.5 F (36.9 C) (Oral)  Resp 14  Ht 5' 8"  (1.727 m)  Wt 172 lb 1 oz (78.047 kg)  BMI 26.17 kg/m2  SpO2 98% Physical Exam  Constitutional: She is oriented to person, place, and time. She appears well-developed and well-nourished.  HENT:  Head: Normocephalic and atraumatic.  Eyes: Pupils are equal, round, and reactive to light.  Neck: Normal range of motion. Neck supple.  Cardiovascular: Normal rate, regular rhythm and normal heart sounds.   Pulmonary/Chest: Effort normal and breath sounds normal. No respiratory distress. She has no wheezes. She has no rales. She exhibits no tenderness.   Abdominal: Soft. Bowel sounds are normal. There is no tenderness. There is no rebound and no guarding.  Musculoskeletal: Normal range of motion. She exhibits no edema.  Mild pain on range of motion of the right shoulder. There some mild tenderness on palpation of the biceps muscle. There is no swelling or deformity.  Lymphadenopathy:    She has no cervical adenopathy.  Neurological: She is alert and oriented to person, place, and time. She has normal strength. No cranial nerve deficit or sensory deficit. GCS eye subscore is 4. GCS verbal subscore is 5. GCS motor subscore is 6.  Gait normal  Skin: Skin is warm and dry. No rash noted.  Psychiatric: She has a normal mood and affect.    ED Course  Procedures (including critical care time) Labs Review Labs Reviewed  BASIC METABOLIC PANEL - Abnormal; Notable for the following:    Creatinine, Ser 1.02 (*)    GFR calc non Af Amer 58 (*)  All other components within normal limits  CBC  I-STAT TROPOININ, ED    Imaging Review Dg Chest 2 View  09/29/2015  CLINICAL DATA:  Shortness of breath.  Right arm pain. EXAM: CHEST  2 VIEW COMPARISON:  None. FINDINGS: Heart size and vascularity are normal and the lungs are clear. No effusions. No acute osseous abnormality. Bilateral cervical ribs, larger on the left than the right. IMPRESSION: No active cardiopulmonary disease. Electronically Signed   By: Lorriane Shire M.D.   On: 09/29/2015 17:05   I have personally reviewed and evaluated these images and lab results as part of my medical decision-making.   EKG Interpretation   Date/Time:  Monday September 29 2015 16:00:19 EDT Ventricular Rate:  88 PR Interval:  156 QRS Duration: 90 QT Interval:  418 QTC Calculation: 505 R Axis:   26 Text Interpretation:  Normal sinus rhythm Prolonged QT Abnormal ECG No old  tracing to compare Confirmed by Leonardo Plaia  MD, Nayab Aten ([id number]) on 09/29/2015  7:04:35 PM      MDM   Final diagnoses:  Essential  hypertension    Patient is asymptomatic now. Her arm pain sounds musculoskeletal rather than cardiac. Her blood pressure is not significantly elevated. Her glucose is within normal limits. Case manager spoke with patient and her medications are weight for her at the pharmacy. She was discharged home in good condition. She was encouraged to follow-up with her PCP.    Malvin Johns, MD 09/29/15 2012

## 2015-09-29 NOTE — Discharge Instructions (Signed)
Hypertension Hypertension, commonly called high blood pressure, is when the force of blood pumping through your arteries is too strong. Your arteries are the blood vessels that carry blood from your heart throughout your body. A blood pressure reading consists of a higher number over a lower number, such as 110/72. The higher number (systolic) is the pressure inside your arteries when your heart pumps. The lower number (diastolic) is the pressure inside your arteries when your heart relaxes. Ideally you want your blood pressure below 120/80. Hypertension forces your heart to work harder to pump blood. Your arteries may become narrow or stiff. Having untreated or uncontrolled hypertension can cause heart attack, stroke, kidney disease, and other problems. RISK FACTORS Some risk factors for high blood pressure are controllable. Others are not.  Risk factors you cannot control include:   Race. You may be at higher risk if you are African American.  Age. Risk increases with age.  Gender. Men are at higher risk than women before age 45 years. After age 65, women are at higher risk than men. Risk factors you can control include:  Not getting enough exercise or physical activity.  Being overweight.  Getting too much fat, sugar, calories, or salt in your diet.  Drinking too much alcohol. SIGNS AND SYMPTOMS Hypertension does not usually cause signs or symptoms. Extremely high blood pressure (hypertensive crisis) may cause headache, anxiety, shortness of breath, and nosebleed. DIAGNOSIS To check if you have hypertension, your health care provider will measure your blood pressure while you are seated, with your arm held at the level of your heart. It should be measured at least twice using the same arm. Certain conditions can cause a difference in blood pressure between your right and left arms. A blood pressure reading that is higher than normal on one occasion does not mean that you need treatment. If  it is not clear whether you have high blood pressure, you may be asked to return on a different day to have your blood pressure checked again. Or, you may be asked to monitor your blood pressure at home for 1 or more weeks. TREATMENT Treating high blood pressure includes making lifestyle changes and possibly taking medicine. Living a healthy lifestyle can help lower high blood pressure. You may need to change some of your habits. Lifestyle changes may include:  Following the DASH diet. This diet is high in fruits, vegetables, and whole grains. It is low in salt, red meat, and added sugars.  Keep your sodium intake below 2,300 mg per day.  Getting at least 30-45 minutes of aerobic exercise at least 4 times per week.  Losing weight if necessary.  Not smoking.  Limiting alcoholic beverages.  Learning ways to reduce stress. Your health care provider may prescribe medicine if lifestyle changes are not enough to get your blood pressure under control, and if one of the following is true:  You are 18-59 years of age and your systolic blood pressure is above 140.  You are 60 years of age or older, and your systolic blood pressure is above 150.  Your diastolic blood pressure is above 90.  You have diabetes, and your systolic blood pressure is over 140 or your diastolic blood pressure is over 90.  You have kidney disease and your blood pressure is above 140/90.  You have heart disease and your blood pressure is above 140/90. Your personal target blood pressure may vary depending on your medical conditions, your age, and other factors. HOME CARE INSTRUCTIONS    Have your blood pressure rechecked as directed by your health care provider.   Take medicines only as directed by your health care provider. Follow the directions carefully. Blood pressure medicines must be taken as prescribed. The medicine does not work as well when you skip doses. Skipping doses also puts you at risk for  problems.  Do not smoke.   Monitor your blood pressure at home as directed by your health care provider. SEEK MEDICAL CARE IF:   You think you are having a reaction to medicines taken.  You have recurrent headaches or feel dizzy.  You have swelling in your ankles.  You have trouble with your vision. SEEK IMMEDIATE MEDICAL CARE IF:  You develop a severe headache or confusion.  You have unusual weakness, numbness, or feel faint.  You have severe chest or abdominal pain.  You vomit repeatedly.  You have trouble breathing. MAKE SURE YOU:   Understand these instructions.  Will watch your condition.  Will get help right away if you are not doing well or get worse.   This information is not intended to replace advice given to you by your health care provider. Make sure you discuss any questions you have with your health care provider.   Document Released: 11/29/2005 Document Revised: 04/15/2015 Document Reviewed: 09/21/2013 Elsevier Interactive Patient Education 2016 [employer] Inc.  

## 2015-09-29 NOTE — ED Notes (Signed)
Pt here for right arm pain when she lays down on it. sts also some SOB at night. sts she hasn't had her diabetes meds in 6 days.

## 2015-09-30 ENCOUNTER — Telehealth: Payer: Self-pay

## 2015-09-30 ENCOUNTER — Other Ambulatory Visit: Payer: Self-pay | Admitting: Internal Medicine

## 2015-09-30 MED ORDER — ATORVASTATIN CALCIUM 10 MG PO TABS: 10.0000 mg | ORAL_TABLET | Freq: Every day | ORAL | Status: DC

## 2015-09-30 NOTE — Telephone Encounter (Signed)
-----   Message from Lance Bosch, NP sent at 09/30/2015 12:06 PM EDT ----- Kidney function is improving but still not in normal limits. Remember no NSAID's. Her cholesterol is really elevated and I would like her to begin taking Atorvastatin 10 mg daily. I will be sure to monitor her liver function through this time while she is on cholesterol medication. The infectious disease doctors usually say it is ok in Hep C patients but just start at a low dose

## 2015-09-30 NOTE — Telephone Encounter (Signed)
Attempted to call patient Home number does not accept incoming calls

## 2015-09-30 NOTE — Telephone Encounter (Signed)
Patient not available Left message with family member to have her return our call

## 2015-11-03 ENCOUNTER — Other Ambulatory Visit: Payer: Self-pay | Admitting: Internal Medicine

## 2015-11-03 NOTE — Telephone Encounter (Signed)
Nurse called pharmacy to question refill request for amlodipine. Pharmacy has already filled medication today.

## 2015-12-03 ENCOUNTER — Other Ambulatory Visit: Payer: Self-pay

## 2015-12-03 DIAGNOSIS — I10 Essential (primary) hypertension: Secondary | ICD-10-CM

## 2015-12-03 DIAGNOSIS — E1142 Type 2 diabetes mellitus with diabetic polyneuropathy: Secondary | ICD-10-CM

## 2015-12-03 MED ORDER — AMLODIPINE BESYLATE 10 MG PO TABS: ORAL_TABLET | ORAL | Status: DC

## 2015-12-03 MED ORDER — ATORVASTATIN CALCIUM 10 MG PO TABS: 10.0000 mg | ORAL_TABLET | Freq: Every day | ORAL | Status: DC

## 2015-12-03 MED ORDER — GLIPIZIDE ER 2.5 MG PO TB24: ORAL_TABLET | ORAL | Status: DC

## 2015-12-03 MED ORDER — GABAPENTIN 300 MG PO CAPS: 600.0000 mg | ORAL_CAPSULE | Freq: Every day | ORAL | Status: DC | PRN

## 2015-12-03 MED ORDER — VALACYCLOVIR HCL 500 MG PO TABS: 500.0000 mg | ORAL_TABLET | Freq: Every day | ORAL | Status: DC

## 2015-12-03 MED ORDER — HYDROCHLOROTHIAZIDE 12.5 MG PO CAPS: ORAL_CAPSULE | ORAL | Status: DC

## 2015-12-03 NOTE — Progress Notes (Unsigned)
Patient came into office to speak with nurse Currently patient has no phone and was in need of medication refills  As well as her lab results from her last visit Medications sent to [facility] on [address] per patient request

## 2015-12-09 ENCOUNTER — Telehealth: Payer: Self-pay | Admitting: Internal Medicine

## 2015-12-09 NOTE — Telephone Encounter (Signed)
Patient came into facility to request to speak with CMA, CMA was in clinic and unable to see patient. Patient became very upset stating "this is an emergency, this is life or death I need my medicine"  Patient was then asked what medications she needed to let the CMA know but patient refused stating that she would wait until her appointment, patient was also advised that if it was an emergency she would need to head to the ED but patient refused.  Patient then started yelling "she will see me when I come and she will speak to me because this is very unprofessional"  Patient then walked out of facility.

## 2015-12-16 ENCOUNTER — Ambulatory Visit: Payer: Medicaid Other | Admitting: Internal Medicine

## 2016-03-11 ENCOUNTER — Other Ambulatory Visit: Payer: Self-pay | Admitting: Internal Medicine

## 2016-04-10 ENCOUNTER — Other Ambulatory Visit: Payer: Self-pay | Admitting: Internal Medicine

## 2016-07-07 ENCOUNTER — Other Ambulatory Visit: Payer: Self-pay | Admitting: Internal Medicine

## 2016-07-07 NOTE — Telephone Encounter (Signed)
Rx Requests 

## 2016-07-08 ENCOUNTER — Telehealth: Payer: Self-pay | Admitting: Internal Medicine

## 2016-07-08 NOTE — Telephone Encounter (Signed)
Medication Refill:   glipiZIDE (GLIPIZIDE XL) 2.5 MG 24 hr tablet [id number]  amLODipine (NORVASC) 10 MG tablet [id number]   Pt will call next week to schedule appointment to re-establish care

## 2016-07-09 MED ORDER — GLIPIZIDE ER 2.5 MG PO TB24: 2.5000 mg | ORAL_TABLET | Freq: Every day | ORAL | 0 refills | Status: DC

## 2016-07-09 MED ORDER — AMLODIPINE BESYLATE 10 MG PO TABS: 10.0000 mg | ORAL_TABLET | Freq: Every morning | ORAL | 0 refills | Status: DC

## 2016-07-09 NOTE — Telephone Encounter (Signed)
Requested medications refilled 

## 2016-08-11 ENCOUNTER — Telehealth: Payer: Self-pay | Admitting: Internal Medicine

## 2016-08-11 ENCOUNTER — Other Ambulatory Visit: Payer: Self-pay | Admitting: Pharmacist

## 2016-08-11 DIAGNOSIS — I10 Essential (primary) hypertension: Secondary | ICD-10-CM

## 2016-08-11 DIAGNOSIS — M25562 Pain in left knee: Secondary | ICD-10-CM

## 2016-08-11 MED ORDER — AMLODIPINE BESYLATE 10 MG PO TABS: 10.0000 mg | ORAL_TABLET | Freq: Every morning | ORAL | 0 refills | Status: DC

## 2016-08-11 MED ORDER — GLIPIZIDE ER 2.5 MG PO TB24: 2.5000 mg | ORAL_TABLET | Freq: Every day | ORAL | 0 refills | Status: DC

## 2016-08-11 MED ORDER — HYDROCHLOROTHIAZIDE 12.5 MG PO CAPS: ORAL_CAPSULE | ORAL | 0 refills | Status: DC

## 2016-08-11 MED ORDER — ATORVASTATIN CALCIUM 10 MG PO TABS: 10.0000 mg | ORAL_TABLET | Freq: Every day | ORAL | 0 refills | Status: DC

## 2016-08-11 NOTE — Telephone Encounter (Signed)
Chronic medications refilled - patient must have office visit for any further refills

## 2016-08-11 NOTE — Telephone Encounter (Signed)
Pt called requesting medication refill on all current medication. Pt has been trying to get appointment. Please f/up

## 2016-08-26 ENCOUNTER — Emergency Department (HOSPITAL_COMMUNITY): Payer: Medicaid Other

## 2016-08-26 ENCOUNTER — Encounter (HOSPITAL_COMMUNITY): Payer: Self-pay | Admitting: Emergency Medicine

## 2016-08-26 ENCOUNTER — Emergency Department (HOSPITAL_COMMUNITY)
Admission: EM | Admit: 2016-08-26 | Discharge: 2016-08-26 | Disposition: A | Discharge status: Home / Self Care | Payer: Medicaid Other | Attending: Emergency Medicine | Admitting: Emergency Medicine

## 2016-08-26 DIAGNOSIS — Z79899 Other long term (current) drug therapy: Secondary | ICD-10-CM | POA: Insufficient documentation

## 2016-08-26 DIAGNOSIS — I10 Essential (primary) hypertension: Secondary | ICD-10-CM | POA: Diagnosis not present

## 2016-08-26 DIAGNOSIS — F17210 Nicotine dependence, cigarettes, uncomplicated: Secondary | ICD-10-CM | POA: Diagnosis not present

## 2016-08-26 DIAGNOSIS — E119 Type 2 diabetes mellitus without complications: Secondary | ICD-10-CM | POA: Insufficient documentation

## 2016-08-26 DIAGNOSIS — E876 Hypokalemia: Secondary | ICD-10-CM | POA: Insufficient documentation

## 2016-08-26 DIAGNOSIS — G510 Bell's palsy: Secondary | ICD-10-CM | POA: Insufficient documentation

## 2016-08-26 DIAGNOSIS — R200 Anesthesia of skin: Secondary | ICD-10-CM | POA: Diagnosis present

## 2016-08-26 LAB — CBC WITH DIFFERENTIAL/PLATELET
Basophils Absolute: 0 10*3/uL (ref 0.0–0.1)
Basophils Relative: 0 %
Eosinophils Absolute: 0.1 10*3/uL (ref 0.0–0.7)
Eosinophils Relative: 1 %
HEMATOCRIT: 40.3 % (ref 36.0–46.0)
HEMOGLOBIN: 14.4 g/dL (ref 12.0–15.0)
Lymphocytes Relative: 41 %
Lymphs Abs: 4.1 10*3/uL — ABNORMAL HIGH (ref 0.7–4.0)
MCH: 32.6 pg (ref 26.0–34.0)
MCHC: 35.7 g/dL (ref 30.0–36.0)
MCV: 91.2 fL (ref 78.0–100.0)
MONO ABS: 0.8 10*3/uL (ref 0.1–1.0)
MONOS PCT: 9 %
Neutro Abs: 4.8 10*3/uL (ref 1.7–7.7)
Neutrophils Relative %: 49 %
Platelets: 240 10*3/uL (ref 150–400)
RBC: 4.42 MIL/uL (ref 3.87–5.11)
RDW: 12.6 % (ref 11.5–15.5)
WBC: 9.9 10*3/uL (ref 4.0–10.5)

## 2016-08-26 LAB — BASIC METABOLIC PANEL
Anion gap: 10 (ref 5–15)
BUN: 30 mg/dL — AB (ref 6–20)
CALCIUM: 9.3 mg/dL (ref 8.9–10.3)
CO2: 25 mmol/L (ref 22–32)
CREATININE: 1.73 mg/dL — AB (ref 0.44–1.00)
Chloride: 106 mmol/L (ref 101–111)
GFR calc Af Amer: 35 mL/min — ABNORMAL LOW (ref >60)
GFR calc non Af Amer: 30 mL/min — ABNORMAL LOW (ref >60)
GLUCOSE: 112 mg/dL — AB (ref 65–99)
Potassium: 2.8 mmol/L — ABNORMAL LOW (ref 3.5–5.1)
Sodium: 141 mmol/L (ref 135–145)

## 2016-08-26 LAB — CBG MONITORING, ED: Glucose-Capillary: 110 mg/dL — ABNORMAL HIGH (ref 65–99)

## 2016-08-26 MED ORDER — VALACYCLOVIR HCL 500 MG PO TABS
1000.0000 mg | ORAL_TABLET | Freq: Once | ORAL | Status: AC
  Administered 2016-08-26: 1000 mg via ORAL
  Filled 2016-08-26: qty 2

## 2016-08-26 MED ORDER — POTASSIUM CHLORIDE CRYS ER 20 MEQ PO TBCR: 40.0000 meq | EXTENDED_RELEASE_TABLET | Freq: Every day | ORAL | 0 refills | Status: DC

## 2016-08-26 MED ORDER — PREDNISONE 20 MG PO TABS: ORAL_TABLET | ORAL | 0 refills | Status: DC

## 2016-08-26 MED ORDER — PREDNISONE 20 MG PO TABS
60.0000 mg | ORAL_TABLET | Freq: Once | ORAL | Status: AC
  Administered 2016-08-26: 60 mg via ORAL
  Filled 2016-08-26: qty 3

## 2016-08-26 MED ORDER — POTASSIUM CHLORIDE CRYS ER 20 MEQ PO TBCR
40.0000 meq | EXTENDED_RELEASE_TABLET | Freq: Once | ORAL | Status: AC
  Administered 2016-08-26: 40 meq via ORAL
  Filled 2016-08-26: qty 2

## 2016-08-26 MED ORDER — ACETAMINOPHEN 500 MG PO TABS
1000.0000 mg | ORAL_TABLET | Freq: Once | ORAL | Status: AC
  Administered 2016-08-26: 1000 mg via ORAL
  Filled 2016-08-26: qty 2

## 2016-08-26 MED ORDER — VALACYCLOVIR HCL 1 G PO TABS: 1000.0000 mg | ORAL_TABLET | Freq: Three times a day (TID) | ORAL | 0 refills | Status: AC

## 2016-08-26 NOTE — ED Notes (Signed)
Pt becoming agitated and wanting her discharge papers

## 2016-08-26 NOTE — ED Provider Notes (Signed)
[facility] DEPT Provider Note   CSN: [id number] Arrival date & time: 08/26/16  2585     History   Chief Complaint Chief Complaint  Patient presents with  . Numbness    HPI Mary Stephens is a 60 y.o. female.  HPI Patient first noticed symptoms starting yesterday evening. Today her daughter told her she could really tell that her face seemed to be droopy and they were worried that she had a stroke. Patient reports she is started and noticed some difficulty closing her eye on the right in that her mouth became uneven. No associated weakness numbness tingling of extremities. No gait dysfunction. No nausea vomiting or visual changes. Past Medical History:  Diagnosis Date  . Anxiety   . Arthritis   . Diabetes mellitus without complication ([place])   . Hepatitis C   . Hypertension     Patient Active Problem List   Diagnosis Date Noted  . HTN (hypertension) 05/14/2015  . Smoking 12/27/2014  . DM (diabetes mellitus), type 2 ([place]) 09/12/2014  . Chronic hepatitis C without mention of hepatic coma 08/14/2014    Past Surgical History:  Procedure Laterality Date  . PARTIAL HYSTERECTOMY  2001    OB History    No data available       Home Medications    Prior to Admission medications   Medication Sig Start Date End Date Taking? Authorizing Provider  amLODipine (NORVASC) 10 MG tablet Take 1 tablet (10 mg total) by mouth every morning. 08/11/16  Yes Tresa Garter, MD  gabapentin (NEURONTIN) 300 MG capsule Take 2 capsules (600 mg total) by mouth daily as needed (for nerve pains in feet, only PRN). 12/03/15  Yes Lance Bosch, NP  glipiZIDE (GLIPIZIDE XL) 2.5 MG 24 hr tablet Take 1 tablet (2.5 mg total) by mouth daily with breakfast. 08/11/16  Yes Tresa Garter, MD  hydrochlorothiazide (MICROZIDE) 12.5 MG capsule TAKE 1 CAPSULE (12.5 MG TOTAL) BY MOUTH EVERY MORNING. 08/11/16  Yes Tresa Garter, MD  valACYclovir (VALTREX) 500 MG tablet TAKE 1 TABLET BY MOUTH  EVERY DAY 03/16/16  Yes Lance Bosch, NP  atorvastatin (LIPITOR) 10 MG tablet Take 1 tablet (10 mg total) by mouth daily. 08/11/16   Tresa Garter, MD  diclofenac sodium (VOLTAREN) 1 % GEL Apply 2 g topically 4 (four) times daily. Patient not taking: Reported on 08/26/2016 12/27/14   Lance Bosch, NP  diphenhydrAMINE (BENADRYL) 25 MG tablet Take 1 tablet (25 mg total) by mouth every 6 (six) hours. Patient not taking: Reported on 08/26/2016 07/20/15   Lahoma Rocker Kirichenko, PA-C  glucose blood test strip Check BS twice daily, 250.00 09/12/14   Lance Bosch, NP  glucose monitoring kit (FREESTYLE) monitoring kit 1 each by Does not apply route as needed for other. Please check BS twice daily before meals, 250.00 09/12/14   Lance Bosch, NP  Lancets (FREESTYLE) lancets Check BS twice daily before meals, 250.00 09/12/14   Lance Bosch, NP  [place]-Paritapre-Ritona-Dasab (VIEKIRA PAK) 12.5-75-50 &250 MG TBPK Take 1 Package by mouth 2 (two) times daily. Patient not taking: Reported on 08/26/2016 10/23/14   Thayer Headings, MD  potassium chloride SA (K-DUR,KLOR-CON) 20 MEQ tablet Take 2 tablets (40 mEq total) by mouth daily. 08/26/16   Charlesetta Shanks, MD  predniSONE (DELTASONE) 20 MG tablet 3 tabs po daily x 3 days, then 2 tabs x 3 days, then 1.5 tabs x 3 days, then 1 tab x 3 days, then 0.5  tabs x 3 days 08/26/16   Charlesetta Shanks, MD  valACYclovir (VALTREX) 1000 MG tablet Take 1 tablet (1,000 mg total) by mouth 3 (three) times daily. 08/26/16 09/02/16  Charlesetta Shanks, MD    Family History Family History  Problem Relation Age of Onset  . Diabetes Mother     Social History Social History  Substance Use Topics  . Smoking status: Current Every Day Smoker    Packs/day: 0.25    Types: Cigarettes  . Smokeless tobacco: Never Used     Comment: admits to cuttting back 4 cigarettes/day  . Alcohol use No     Allergies   Review of patient's allergies indicates no known allergies.   Review of  Systems Review of Systems 10 Systems reviewed and are negative for acute change except as noted in the HPI.   Physical Exam Updated Vital Signs BP 129/96 (BP Location: Left Arm)   Pulse 73   Temp 97.5 F (36.4 C) (Oral)   Resp 16   SpO2 98%   Physical Exam  Constitutional: She appears well-developed and well-nourished. No distress.  HENT:  Head: Normocephalic and atraumatic.  Eyes: Conjunctivae and EOM are normal. Pupils are equal, round, and reactive to light.  Neck: Neck supple.  Cardiovascular: Normal rate and regular rhythm.   No murmur heard. Pulmonary/Chest: Effort normal and breath sounds normal. No respiratory distress.  Abdominal: Soft. There is no tenderness.  Musculoskeletal: She exhibits no edema.  Neurological: She is alert. A cranial nerve deficit is present. She exhibits normal muscle tone. Coordination normal.  Right facial droop and cranial nerve VII dysfunction with eye closing on the right. Also no elevation of brow on the right. Normal bilateral grip strength and push pull. No pronator drift. Normal lower extremity strength testing with normal sensation to light touch.  Skin: Skin is warm and dry.  Psychiatric: She has a normal mood and affect.  Nursing note and vitals reviewed.    ED Treatments / Results  Labs (all labs ordered are listed, but only abnormal results are displayed) Labs Reviewed  BASIC METABOLIC PANEL - Abnormal; Notable for the following:       Result Value   Potassium 2.8 (*)    Glucose, Bld 112 (*)    BUN 30 (*)    Creatinine, Ser 1.73 (*)    GFR calc non Af Amer 30 (*)    GFR calc Af Amer 35 (*)    All other components within normal limits  CBC WITH DIFFERENTIAL/PLATELET - Abnormal; Notable for the following:    Lymphs Abs 4.1 (*)    All other components within normal limits  CBG MONITORING, ED - Abnormal; Notable for the following:    Glucose-Capillary 110 (*)    All other components within normal limits    EKG  EKG  Interpretation None       Radiology Ct Head Wo Contrast  Result Date: 08/26/2016 CLINICAL DATA:  Facial numbness since yesterday.  Weakness. EXAM: CT HEAD WITHOUT CONTRAST TECHNIQUE: Contiguous axial images were obtained from the base of the skull through the vertex without intravenous contrast. COMPARISON:  08/03/2014 FINDINGS: Brain: No evidence of acute infarction, hemorrhage, hydrocephalus, extra-axial collection or mass lesion/mass effect. Vascular: No hyperdense vessel or unexpected calcification. Skull: Normal. Negative for fracture or focal lesion. Sinuses/Orbits: No acute finding. IMPRESSION: No acute intracranial abnormality. Electronically Signed   By: Jeb Levering M.D.   On: 08/26/2016 20:00    Procedures Procedures (including critical care time)  Medications  Ordered in ED Medications  acetaminophen (TYLENOL) tablet 1,000 mg (1,000 mg Oral Given 08/26/16 2003)  potassium chloride SA (K-DUR,KLOR-CON) CR tablet 40 mEq (40 mEq Oral Given 08/26/16 2228)  predniSONE (DELTASONE) tablet 60 mg (60 mg Oral Given 08/26/16 2253)  valACYclovir (VALTREX) tablet 1,000 mg (1,000 mg Oral Given 08/26/16 2252)     Initial Impression / Assessment and Plan / ED Course  I have reviewed the triage vital signs and the nursing notes.  Pertinent labs & imaging results that were available during my care of the patient were reviewed by me and considered in my medical decision making (see chart for details).  Clinical Course    Final Clinical Impressions(s) / ED Diagnoses   Final diagnoses:  Bell's palsy  Hypokalemia  Patient's physical exam findings are consistent with Bell's palsy. Patient does have incidental hypokalemia. Potassium replaced orally. Patient is counseled on follow-up plan.  New Prescriptions New Prescriptions   POTASSIUM CHLORIDE SA (K-DUR,KLOR-CON) 20 MEQ TABLET    Take 2 tablets (40 mEq total) by mouth daily.   PREDNISONE (DELTASONE) 20 MG TABLET    3 tabs po daily x 3  days, then 2 tabs x 3 days, then 1.5 tabs x 3 days, then 1 tab x 3 days, then 0.5 tabs x 3 days   VALACYCLOVIR (VALTREX) 1000 MG TABLET    Take 1 tablet (1,000 mg total) by mouth 3 (three) times daily.     Charlesetta Shanks, MD 08/26/16 [phone]

## 2016-08-26 NOTE — ED Triage Notes (Signed)
Pt presents to the ED complaining of headache x a few days but today while brushing her hair she noted that she couldn't close her right eye to blink as well as on the left.  Tearing in that eye.  Numbness and slight droop on the right to the face and mouth.  Daughter was afraid she was having a stroke and brought her in for evaluation.

## 2016-08-26 NOTE — ED Notes (Signed)
Bed: MM21 Expected date:  Expected time:  Means of arrival:  Comments: Tr 3

## 2016-09-08 ENCOUNTER — Ambulatory Visit: Payer: Medicaid Other | Attending: Internal Medicine | Admitting: Internal Medicine

## 2016-09-08 ENCOUNTER — Other Ambulatory Visit: Payer: Self-pay | Admitting: Internal Medicine

## 2016-09-08 ENCOUNTER — Encounter: Payer: Self-pay | Admitting: Internal Medicine

## 2016-09-08 VITALS — BP 134/88 | HR 81 | Temp 98.1°F | Resp 16 | Wt 176.8 lb

## 2016-09-08 DIAGNOSIS — E785 Hyperlipidemia, unspecified: Secondary | ICD-10-CM | POA: Insufficient documentation

## 2016-09-08 DIAGNOSIS — E1142 Type 2 diabetes mellitus with diabetic polyneuropathy: Secondary | ICD-10-CM

## 2016-09-08 DIAGNOSIS — I129 Hypertensive chronic kidney disease with stage 1 through stage 4 chronic kidney disease, or unspecified chronic kidney disease: Secondary | ICD-10-CM | POA: Insufficient documentation

## 2016-09-08 DIAGNOSIS — G510 Bell's palsy: Secondary | ICD-10-CM | POA: Insufficient documentation

## 2016-09-08 DIAGNOSIS — E1122 Type 2 diabetes mellitus with diabetic chronic kidney disease: Secondary | ICD-10-CM | POA: Insufficient documentation

## 2016-09-08 DIAGNOSIS — M1990 Unspecified osteoarthritis, unspecified site: Secondary | ICD-10-CM | POA: Diagnosis not present

## 2016-09-08 DIAGNOSIS — F411 Generalized anxiety disorder: Secondary | ICD-10-CM | POA: Diagnosis not present

## 2016-09-08 DIAGNOSIS — F17210 Nicotine dependence, cigarettes, uncomplicated: Secondary | ICD-10-CM | POA: Diagnosis not present

## 2016-09-08 DIAGNOSIS — B182 Chronic viral hepatitis C: Secondary | ICD-10-CM | POA: Insufficient documentation

## 2016-09-08 DIAGNOSIS — Z1239 Encounter for other screening for malignant neoplasm of breast: Secondary | ICD-10-CM

## 2016-09-08 DIAGNOSIS — Z23 Encounter for immunization: Secondary | ICD-10-CM | POA: Insufficient documentation

## 2016-09-08 DIAGNOSIS — Z0000 Encounter for general adult medical examination without abnormal findings: Secondary | ICD-10-CM

## 2016-09-08 DIAGNOSIS — I10 Essential (primary) hypertension: Secondary | ICD-10-CM

## 2016-09-08 DIAGNOSIS — E559 Vitamin D deficiency, unspecified: Secondary | ICD-10-CM | POA: Insufficient documentation

## 2016-09-08 DIAGNOSIS — F329 Major depressive disorder, single episode, unspecified: Secondary | ICD-10-CM | POA: Diagnosis not present

## 2016-09-08 DIAGNOSIS — F17200 Nicotine dependence, unspecified, uncomplicated: Secondary | ICD-10-CM

## 2016-09-08 DIAGNOSIS — Z720 Tobacco use: Secondary | ICD-10-CM | POA: Diagnosis not present

## 2016-09-08 DIAGNOSIS — F32A Depression, unspecified: Secondary | ICD-10-CM

## 2016-09-08 DIAGNOSIS — E2839 Other primary ovarian failure: Secondary | ICD-10-CM

## 2016-09-08 DIAGNOSIS — N183 Chronic kidney disease, stage 3 (moderate): Secondary | ICD-10-CM | POA: Insufficient documentation

## 2016-09-08 LAB — BASIC METABOLIC PANEL WITH GFR
BUN: 22 mg/dL (ref 7–25)
CHLORIDE: 103 mmol/L (ref 98–110)
CO2: 27 mmol/L (ref 20–31)
Calcium: 9.4 mg/dL (ref 8.6–10.4)
Creat: 1.4 mg/dL — ABNORMAL HIGH (ref 0.50–0.99)
GFR, EST AFRICAN AMERICAN: 46 mL/min — AB (ref >=60)
GFR, EST NON AFRICAN AMERICAN: 40 mL/min — AB (ref >=60)
Glucose, Bld: 149 mg/dL — ABNORMAL HIGH (ref 65–99)
POTASSIUM: 3.5 mmol/L (ref 3.5–5.3)
SODIUM: 143 mmol/L (ref 135–146)

## 2016-09-08 LAB — TSH: TSH: 0.95 m[IU]/L

## 2016-09-08 LAB — LIPID PANEL
CHOL/HDL RATIO: 2.8 ratio (ref <=5.0)
CHOLESTEROL: 186 mg/dL (ref 125–200)
HDL: 67 mg/dL (ref >=46)
LDL Cholesterol: 95 mg/dL (ref <130)
TRIGLYCERIDES: 120 mg/dL (ref <150)
VLDL: 24 mg/dL (ref <30)

## 2016-09-08 LAB — POCT GLYCOSYLATED HEMOGLOBIN (HGB A1C): HEMOGLOBIN A1C: 6.7

## 2016-09-08 LAB — GLUCOSE, POCT (MANUAL RESULT ENTRY): POC Glucose: 148 mg/dl — AB (ref 70–99)

## 2016-09-08 MED ORDER — ATORVASTATIN CALCIUM 10 MG PO TABS
10.0000 mg | ORAL_TABLET | Freq: Every day | ORAL | 0 refills | Status: DC
Start: 2016-09-08 — End: 2016-10-20

## 2016-09-08 MED ORDER — GLIPIZIDE ER 5 MG PO TB24: 5.0000 mg | ORAL_TABLET | Freq: Every day | ORAL | 3 refills | Status: DC

## 2016-09-08 MED ORDER — GABAPENTIN 300 MG PO CAPS: 300.0000 mg | ORAL_CAPSULE | Freq: Every day | ORAL | 3 refills | Status: DC | PRN

## 2016-09-08 MED ORDER — CARBAMIDE PEROXIDE 6.5 % OT SOLN: 5.0000 [drp] | Freq: Two times a day (BID) | OTIC | 0 refills | Status: DC

## 2016-09-08 MED ORDER — BUPROPION HCL ER (SR) 150 MG PO TB12: 150.0000 mg | ORAL_TABLET | Freq: Two times a day (BID) | ORAL | 2 refills | Status: DC

## 2016-09-08 MED ORDER — SENSITIVE EYES SALINE SOLN: 2.0000 "application " | Freq: Two times a day (BID) | 0 refills | Status: DC

## 2016-09-08 MED ORDER — AMLODIPINE BESYLATE 10 MG PO TABS: 10.0000 mg | ORAL_TABLET | Freq: Every morning | ORAL | 3 refills | Status: DC

## 2016-09-08 NOTE — Progress Notes (Signed)
Pt is in the office today for hypertension, diabetes and ED follow up Pt states she is not in any pain Pt states she is taking medication without difficulty

## 2016-09-08 NOTE — Patient Instructions (Signed)
You Can Quit Smoking If you are ready to quit smoking or are thinking about it, congratulations! You have chosen to help yourself be healthier and live longer! There are lots of different ways to quit smoking. Nicotine gum, nicotine patches, a nicotine inhaler, or nicotine nasal spray can help with physical craving. Hypnosis, support groups, and medicines help break the habit of smoking. TIPS TO GET OFF AND STAY OFF CIGARETTES  Learn to predict your moods. Do not let a bad situation be your excuse to have a cigarette. Some situations in your life might tempt you to have a cigarette.  Ask friends and co-workers not to smoke around you.  Make your home smoke-free.  Never have "just one" cigarette. It leads to wanting another and another. Remind yourself of your decision to quit.  On a card, make a list of your reasons for not smoking. Read it at least the same number of times a day as you have a cigarette. Tell yourself everyday, "I do not want to smoke. I choose not to smoke."  Ask someone at home or work to help you with your plan to quit smoking.  Have something planned after you eat or have a cup of coffee. Take a walk or get other exercise to perk you up. This will help to keep you from overeating.  Try a relaxation exercise to calm you down and decrease your stress. Remember, you may be tense and nervous the first two weeks after you quit. This will pass.  Find new activities to keep your hands busy. Play with a pen, coin, or rubber band. Doodle or draw things on paper.  Brush your teeth right after eating. This will help cut down the craving for the taste of tobacco after meals. You can try mouthwash too.  Try gum, breath mints, or diet candy to keep something in your mouth. IF YOU SMOKE AND WANT TO QUIT:  Do not stock up on cigarettes. Never buy a carton. Wait until one pack is finished before you buy another.  Never carry cigarettes with you at work or at home.  Keep cigarettes  as far away from you as possible. Leave them with someone else.  Never carry matches or a lighter with you.  Ask yourself, "Do I need this cigarette or is this just a reflex?"  Bet with someone that you can quit. Put cigarette money in a piggy bank every morning. If you smoke, you give up the money. If you do not smoke, by the end of the week, you keep the money.  Keep trying. It takes 21 days to change a habit!  Talk to your doctor about using medicines to help you quit. These include nicotine replacement gum, lozenges, or skin patches.   This information is not intended to replace advice given to you by your health care provider. Make sure you discuss any questions you have with your health care provider.   Document Released: 09/25/2009 Document Revised: 02/21/2012 Document Reviewed: 09/25/2009 Elsevier Interactive Patient Education 2016 Else[employer] Inc.  - Diabetes Mellitus and Food It is important for you to manage your blood sugar (glucose) level. Your blood glucose level can be greatly affected by what you eat. Eating healthier foods in the appropriate amounts throughout the day at about the same time each day will help you control your blood glucose level. It can also help slow or prevent worsening of your diabetes mellitus. Healthy eating may even help you improve the level of your blood  pressure and reach or maintain a healthy weight.  General recommendations for healthful eating and cooking habits include:  Eating meals and snacks regularly. Avoid going long periods of time without eating to lose weight.  Eating a diet that consists mainly of plant-based foods, such as fruits, vegetables, nuts, legumes, and whole grains.  Using low-heat cooking methods, such as baking, instead of high-heat cooking methods, such as deep frying. Work with your dietitian to make sure you understand how to use the Nutrition Facts information on food labels. HOW CAN FOOD AFFECT  ME? Carbohydrates Carbohydrates affect your blood glucose level more than any other type of food. Your dietitian will help you determine how many carbohydrates to eat at each meal and teach you how to count carbohydrates. Counting carbohydrates is important to keep your blood glucose at a healthy level, especially if you are using insulin or taking certain medicines for diabetes mellitus. Alcohol Alcohol can cause sudden decreases in blood glucose (hypoglycemia), especially if you use insulin or take certain medicines for diabetes mellitus. Hypoglycemia can be a life-threatening condition. Symptoms of hypoglycemia (sleepiness, dizziness, and disorientation) are similar to symptoms of having too much alcohol.  If your health care provider has given you approval to drink alcohol, do so in moderation and use the following guidelines:  Women should not have more than one drink per day, and men should not have more than two drinks per day. One drink is equal to:  12 oz of beer.  5 oz of wine.  1 oz of hard liquor.  Do not drink on an empty stomach.  Keep yourself hydrated. Have water, diet soda, or unsweetened iced tea.  Regular soda, juice, and other mixers might contain a lot of carbohydrates and should be counted. WHAT FOODS ARE NOT RECOMMENDED? As you make food choices, it is important to remember that all foods are not the same. Some foods have fewer nutrients per serving than other foods, even though they might have the same number of calories or carbohydrates. It is difficult to get your body what it needs when you eat foods with fewer nutrients. Examples of foods that you should avoid that are high in calories and carbohydrates but low in nutrients include:  Trans fats (most processed foods list trans fats on the Nutrition Facts label).  Regular soda.  Juice.  Candy.  Sweets, such as cake, pie, doughnuts, and cookies.  Fried foods. WHAT FOODS CAN I EAT? Eat nutrient-rich foods,  which will nourish your body and keep you healthy. The food you should eat also will depend on several factors, including:  The calories you need.  The medicines you take.  Your weight.  Your blood glucose level.  Your blood pressure level.  Your cholesterol level. You should eat a variety of foods, including:  Protein.  Lean cuts of meat.  Proteins low in saturated fats, such as fish, egg whites, and beans. Avoid processed meats.  Fruits and vegetables.  Fruits and vegetables that may help control blood glucose levels, such as apples, mangoes, and yams.  Dairy products.  Choose fat-free or low-fat dairy products, such as milk, yogurt, and cheese.  Grains, bread, pasta, and rice.  Choose whole grain products, such as multigrain bread, whole oats, and brown rice. These foods may help control blood pressure.  Fats.  Foods containing healthful fats, such as nuts, avocado, olive oil, canola oil, and fish. DOES EVERYONE WITH DIABETES MELLITUS HAVE THE SAME MEAL PLAN? Because every person with diabetes  mellitus is different, there is not one meal plan that works for everyone. It is very important that you meet with a dietitian who will help you create a meal plan that is just right for you.   This information is not intended to replace advice given to you by your health care provider. Make sure you discuss any questions you have with your health care provider.   Document Released: 08/26/2005 Document Revised: 12/20/2014 Document Reviewed: 10/26/2013 Elsevier Interactive Patient Education 2016 [place] for Eating Away From Home If You Have Diabetes Controlling your level of blood glucose, also known as blood sugar, can be challenging. It can be even more difficult when you do not prepare your own meals. The following tips can help you manage your diabetes when you eat away from home. PLANNING AHEAD Plan ahead if you know you will be eating away from home:  Ask  your health care provider how to time meals and medicine if you are taking insulin.  Make a list of restaurants near you that offer healthy choices. If they have a carry-out menu, take it home and plan what you will order ahead of time.  Look up the restaurant you want to eat at online. Many chain and fast-food restaurants list nutritional information online. Use this information to choose the healthiest options and to calculate how many carbohydrates will be in your meal.  Use a carbohydrate-counting book or mobile app to look up the carbohydrate content and serving size of the foods you want to eat.  Become familiar with serving sizes and learn to recognize how many servings are in a portion. This will allow you to estimate how many carbohydrates you can eat. FREE FOODS A "free food" is any food or drink that has less than 5 g of carbohydrates per serving. Free foods include:  Many vegetables.  Hard boiled eggs.  Nuts or seeds.  Olives.  Cheeses.  Meats. These types of foods make good appetizer choices and are often available at salad bars. Lemon juice, vinegar, or a low-calorie salad dressing of fewer than 20 calories per serving can be used as a "free" salad dressing.  CHOICES TO REDUCE CARBOHYDRATES  Substitute nonfat sweetened yogurt with a sugar-free yogurt. Yogurt made from soy milk may also be used, but you will still want a sugar-free or plain option to choose a lower carbohydrate amount.  Ask your server to take away the bread basket or chips from your table.  Order fresh fruit. A salad bar often offers fresh fruit choices. Avoid canned fruit because it is usually packed in sugar or syrup.  Order a salad, and eat it without dressing. Or, create a "free" salad dressing.  Ask for substitutions. For example, instead of [place] fries, request an order of a vegetable such as salad, green beans, or broccoli. OTHER TIPS   If you take insulin, take the insulin once your food  arrives to your table. This will ensure your insulin and food are timed correctly.  Ask your server about the portion size before your order, and ask for a take-out box if the portion has more servings than you should have. When your food comes, leave the amount you should have on the plate, and put the rest in the take-out box.  Consider splitting an entree with someone and ordering a side salad.   This information is not intended to replace advice given to you by your health care provider. Make sure you discuss  any questions you have with your health care provider.   Document Released: 11/29/2005 Document Revised: 08/20/2015 Document Reviewed: 02/26/2014 Elsevier Interactive Patient Education 2016 [employer].   -  Diabetes and Exercise Exercising regularly is important. It is not just about losing weight. It has many health benefits, such as:  Improving your overall fitness, flexibility, and endurance.  Increasing your bone density.  Helping with weight control.  Decreasing your body fat.  Increasing your muscle strength.  Reducing stress and tension.  Improving your overall health. People with diabetes who exercise gain additional benefits because exercise:  Reduces appetite.  Improves the body's use of blood sugar (glucose).  Helps lower or control blood glucose.  Decreases blood pressure.  Helps control blood lipids (such as cholesterol and triglycerides).  Improves the body's use of the hormone insulin by:  Increasing the body's insulin sensitivity.  Reducing the body's insulin needs.  Decreases the risk for heart disease because exercising:  Lowers cholesterol and triglycerides levels.  Increases the levels of good cholesterol (such as high-density lipoproteins [HDL]) in the body.  Lowers blood glucose levels. YOUR ACTIVITY PLAN  Choose an activity that you enjoy, and set realistic goals. To exercise safely, you should begin practicing any new  physical activity slowly, and gradually increase the intensity of the exercise over time. Your health care provider or diabetes educator can help create an activity plan that works for you. General recommendations include:  Encouraging children to engage in at least 60 minutes of physical activity each day.  Stretching and performing strength training exercises, such as yoga or weight lifting, at least 2 times per week.  Performing a total of at least 150 minutes of moderate-intensity exercise each week, such as brisk walking or water aerobics.  Exercising at least 3 days per week, making sure you allow no more than 2 consecutive days to pass without exercising.  Avoiding long periods of inactivity (90 minutes or more). When you have to spend an extended period of time sitting down, take frequent breaks to walk or stretch. RECOMMENDATIONS FOR EXERCISING WITH TYPE 1 OR TYPE 2 DIABETES   Check your blood glucose before exercising. If blood glucose levels are greater than 240 mg/dL, check for urine ketones. Do not exercise if ketones are present.  Avoid injecting insulin into areas of the body that are going to be exercised. For example, avoid injecting insulin into:  The arms when playing tennis.  The legs when jogging.  Keep a record of:  Food intake before and after you exercise.  Expected peak times of insulin action.  Blood glucose levels before and after you exercise.  The type and amount of exercise you have done.  Review your records with your health care provider. Your health care provider will help you to develop guidelines for adjusting food intake and insulin amounts before and after exercising.  If you take insulin or oral hypoglycemic agents, watch for signs and symptoms of hypoglycemia. They include:  Dizziness.  Shaking.  Sweating.  Chills.  Confusion.  Drink plenty of water while you exercise to prevent dehydration or heat stroke. Body water is lost during  exercise and must be replaced.  Talk to your health care provider before starting an exercise program to make sure it is safe for you. Remember, almost any type of activity is better than none.   This information is not intended to replace advice given to you by your health care provider. Make sure you discuss any questions you  have with your health care provider.   Document Released: 02/19/2004 Document Revised: 04/15/2015 Document Reviewed: 05/08/2013 Elsevier Interactive Patient Education 2016 Elsevier Inc.   - Chronic Kidney Disease Chronic kidney disease occurs when the kidneys are damaged over a long period. The kidneys are two organs that lie on either side of the spine between the middle of the back and the front of the abdomen. The kidneys:  Remove wastes and extra water from the blood.  Produce important hormones. These help keep bones strong, regulate blood pressure, and help create red blood cells.  Balance the fluids and chemicals in the blood and tissues. A small amount of kidney damage may not cause problems, but a large amount of damage may make it difficult or impossible for the kidneys to work the way they should. If steps are not taken to slow down the kidney damage or stop it from getting worse, the kidneys may stop working permanently. Most of the time, chronic kidney disease does not go away. However, it can often be controlled, and those with the disease can usually live normal lives. CAUSES The most common causes of chronic kidney disease are diabetes and high blood pressure (hypertension). Chronic kidney disease may also be caused by:  Diseases that cause the kidneys' filters to become inflamed.  Diseases that affect the immune system.  Genetic diseases.  Medicines that damage the kidneys, such as anti-inflammatory medicines.  Poisoning or exposure to toxic substances.  A reoccurring kidney or urinary infection.  A problem with urine flow. This may be  caused by:  Cancer.  Kidney stones.  An enlarged prostate in males. SIGNS AND SYMPTOMS Because the kidney damage in chronic kidney disease occurs slowly, symptoms develop slowly and may not be obvious until the kidney damage becomes severe. A person may have a kidney disease for years without showing any symptoms. Symptoms can include:  Swelling (edema) of the legs, ankles, or feet.  Tiredness (lethargy).  Nausea or vomiting.  Confusion.  Problems with urination, such as:  Decreased urine production.  Frequent urination, especially at night.  Frequent accidents in children who are potty trained.  Muscle twitches and cramps.  Shortness of breath.  Weakness.  Persistent itchiness.  Loss of appetite.  Metallic taste in the mouth.  Trouble sleeping.  Slowed development in children.  Short stature in children. DIAGNOSIS Chronic kidney disease may be detected and diagnosed by tests, including blood, urine, imaging, or kidney biopsy tests. TREATMENT Most chronic kidney diseases cannot be cured. Treatment usually involves relieving symptoms and preventing or slowing the progression of the disease. Treatment may include:  A special diet. You may need to avoid alcohol and foods thatare salty and high in potassium.  Medicines. These may:  Lower blood pressure.  Relieve anemia.  Relieve swelling.  Protect the bones. HOME CARE INSTRUCTIONS  Follow your prescribed diet. Your health care provider may instruct you to limit daily salt (sodium) and protein intake.  Take medicines only as directed by your health care provider. Do not take any new medicines (prescription, over-the-counter, or nutritional supplements) unless approved by your health care provider. Many medicines can worsen your kidney damage or need to have the dose adjusted.   Quit smoking if you smoke. Talk to your health care provider about a smoking cessation program.  Keep all follow-up visits as  directed by your health care provider.  Monitor your blood pressure.  Start or continue an exercise plan.  Get immunizations as directed by your health  care provider.  Take vitamin and mineral supplements as directed by your health care provider. SEEK IMMEDIATE MEDICAL CARE IF:  Your symptoms get worse or you develop new symptoms.  You develop symptoms of end-stage kidney disease. These include:  Headaches.  Abnormally dark or light skin.  Numbness in the hands or feet.  Easy bruising.  Frequent hiccups.  Menstruation stops.  You have a fever.  You have decreased urine production.  You havepain or bleeding when urinating. MAKE SURE YOU:  Understand these instructions.  Will watch your condition.  Will get help right away if you are not doing well or get worse. FOR MORE INFORMATION   American Association of Kidney Patients: BombTimer.gl  National Kidney Foundation: www.kidney.[place]: https://mathis.com/  [facility]: www.lifeoptions.org and www.kidneyschool.org   This information is not intended to replace advice given to you by your health care provider. Make sure you discuss any questions you have with your health care provider.   Document Released: 09/07/2008 Document Revised: 12/20/2014 Document Reviewed: 07/28/2012 Elsevier Interactive Patient Education 2016 Elsevier Inc. -  Hypertension During Pregnancy Hypertension is also called high blood pressure. Blood pressure moves blood in your body. Sometimes, the force that moves the blood becomes too strong. When you are pregnant, this condition should be watched carefully. It can cause problems for you and your baby. HOME CARE   Make and keep all of your doctor visits.  Take medicine as told by your doctor. Tell your doctor about all medicines you take.  Eat very little salt.  Exercise regularly.  Do not drink alcohol.  Do not smoke.  Do not have drinks  with caffeine.  Lie on your left side when resting.  Your health care provider may ask you to take one low-dose aspirin (81mg ) each day. GET HELP RIGHT AWAY IF:  You have bad belly (abdominal) pain.  You have sudden puffiness (swelling) in the hands, ankles, or face.  You gain 4 pounds (1.8 kilograms) or more in 1 week.  You throw up (vomit) repeatedly.  You have bleeding from the vagina.  You do not feel the baby moving as much.  You have a headache.  You have blurred or double vision.  You have muscle twitching or spasms.  You have shortness of breath.  You have blue fingernails and lips.  You have blood in your pee (urine). MAKE SURE YOU:  Understand these instructions.  Will watch your condition.  Will get help right away if you are not doing well or get worse.   This information is not intended to replace advice given to you by your health care provider. Make sure you discuss any questions you have with your health care provider.   Document Released: 01/01/2011 Document Revised: 12/20/2014 Document Reviewed: 06/28/2013 Elsevier Interactive Patient Education 2016 [place] DASH stands for "Dietary Approaches to Stop Hypertension." The DASH eating plan is a healthy eating plan that has been shown to reduce high blood pressure (hypertension). Additional health benefits may include reducing the risk of type 2 diabetes mellitus, heart disease, and stroke. The DASH eating plan may also help with weight loss. WHAT DO I NEED TO KNOW ABOUT THE DASH EATING PLAN? For the DASH eating plan, you will follow these general guidelines:  Choose foods with a percent daily value for sodium of less than 5% (as listed on the food label).  Use salt-free seasonings or herbs instead of table salt or sea  salt.  Check with your health care provider or pharmacist before using salt substitutes.  Eat lower-sodium products, often labeled as "lower sodium" or "no  salt added."  Eat fresh foods.  Eat more vegetables, fruits, and low-fat dairy products.  Choose whole grains. Look for the word "whole" as the first word in the ingredient list.  Choose fish and skinless chicken or [place] more often than red meat. Limit fish, poultry, and meat to 6 oz (170 g) each day.  Limit sweets, desserts, sugars, and sugary drinks.  Choose heart-healthy fats.  Limit cheese to 1 oz (28 g) per day.  Eat more home-cooked food and less restaurant, buffet, and fast food.  Limit fried foods.  Cook foods using methods other than frying.  Limit canned vegetables. If you do use them, rinse them well to decrease the sodium.  When eating at a restaurant, ask that your food be prepared with less salt, or no salt if possible. WHAT FOODS CAN I EAT? Seek help from a dietitian for individual calorie needs. Grains Whole grain or whole wheat bread. Brown rice. Whole grain or whole wheat pasta. Quinoa, bulgur, and whole grain cereals. Low-sodium cereals. Corn or whole wheat flour tortillas. Whole grain cornbread. Whole grain crackers. Low-sodium crackers. Vegetables Fresh or frozen vegetables (raw, steamed, roasted, or grilled). Low-sodium or reduced-sodium tomato and vegetable juices. Low-sodium or reduced-sodium tomato sauce and paste. Low-sodium or reduced-sodium canned vegetables.  Fruits All fresh, canned (in natural juice), or frozen fruits. Meat and Other Protein Products Ground beef (85% or leaner), grass-fed beef, or beef trimmed of fat. Skinless chicken or [place]. Ground chicken or [place]. Pork trimmed of fat. All fish and seafood. Eggs. Dried beans, peas, or lentils. Unsalted nuts and seeds. Unsalted canned beans. Dairy Low-fat dairy products, such as skim or 1% milk, 2% or reduced-fat cheeses, low-fat ricotta or cottage cheese, or plain low-fat yogurt. Low-sodium or reduced-sodium cheeses. Fats and Oils Tub margarines without trans fats. Light or reduced-fat  mayonnaise and salad dressings (reduced sodium). Avocado. Safflower, olive, or canola oils. Natural peanut or almond butter. Other Unsalted popcorn and pretzels. The items listed above may not be a complete list of recommended foods or beverages. Contact your dietitian for more options. WHAT FOODS ARE NOT RECOMMENDED? Grains White bread. White pasta. White rice. Refined cornbread. Bagels and croissants. Crackers that contain trans fat. Vegetables Creamed or fried vegetables. Vegetables in a cheese sauce. Regular canned vegetables. Regular canned tomato sauce and paste. Regular tomato and vegetable juices. Fruits Dried fruits. Canned fruit in light or heavy syrup. Fruit juice. Meat and Other Protein Products Fatty cuts of meat. Ribs, chicken wings, bacon, sausage, bologna, salami, chitterlings, fatback, hot dogs, bratwurst, and packaged luncheon meats. Salted nuts and seeds. Canned beans with salt. Dairy Whole or 2% milk, cream, half-and-half, and cream cheese. Whole-fat or sweetened yogurt. Full-fat cheeses or blue cheese. Nondairy creamers and whipped toppings. Processed cheese, cheese spreads, or cheese curds. Condiments Onion and garlic salt, seasoned salt, table salt, and sea salt. Canned and packaged gravies. Worcestershire sauce. Tartar sauce. Barbecue sauce. Teriyaki sauce. Soy sauce, including reduced sodium. Steak sauce. Fish sauce. Oyster sauce. Cocktail sauce. Horseradish. Ketchup and mustard. Meat flavorings and tenderizers. Bouillon cubes. Hot sauce. Tabasco sauce. Marinades. Taco seasonings. Relishes. Fats and Oils Butter, stick margarine, lard, shortening, ghee, and bacon fat. Coconut, palm kernel, or palm oils. Regular salad dressings. Other Pickles and olives. Salted popcorn and pretzels. The items listed above may not be a complete list  of foods and beverages to avoid. Contact your dietitian for more information. WHERE CAN I FIND MORE INFORMATION? National Heart, [employer], and  [employer] Institute: travelstabloid.com   This information is not intended to replace advice given to you by your health care provider. Make sure you discuss any questions you have with your health care provider.   Document Released: 11/18/2011 Document Revised: 12/20/2014 Document Reviewed: 10/03/2013 Elsevier Interactive Patient Education [employer].

## 2016-09-08 NOTE — Progress Notes (Signed)
Mary Stephens, is a 60 y.o. female  NKN:[id number]  [id number]  DOB - 10/11/1956  CC:  Chief Complaint  Patient presents with  . Hypertension  . Diabetes  . Follow-up    ED       HPI: Mary Stephens is a 60 y.o. female here today to establish medical care for dm, htn, dm2, depression, hx of hep c treated 2016 per pt, and tob abuse. Pt was in ED 08/26/16, dx w/ right sided Bell's Palsy, treated w/ acyclovir and prednisone. She states she has about 3-4 days left of prednisone. Slight improvement in sx, but not by much. Able to slightly move her right eye now.  Per pt, has been lax w/ her dm diet lately, but states her cbg at home range 80-140s most am.  +tob 1/3 ppd years, wants to stop, but has not tried anything.; no etoh. Mild anxiety /depression, not taking anything, denies si/hi/avh.  Patient has No headache, No chest pain, No abdominal pain - No Nausea, No new weakness tingling or numbness, No Cough - SOB/doe.    Review of Systems: Per HPI, o/w all systems reviewed and negative.   No Known Allergies Past Medical History:  Diagnosis Date  . Anxiety   . Arthritis   . Diabetes mellitus without complication ([name])   . Hepatitis C   . Hypertension    Current Outpatient Prescriptions on File Prior to Visit  Medication Sig Dispense Refill  . glucose blood test strip Check BS twice daily, 250.00 100 each 12  . glucose monitoring kit (FREESTYLE) monitoring kit 1 each by Does not apply route as needed for other. Please check BS twice daily before meals, 250.00 1 each 0  . hydrochlorothiazide (MICROZIDE) 12.5 MG capsule TAKE 1 CAPSULE (12.5 MG TOTAL) BY MOUTH EVERY MORNING. 30 capsule 0  . Lancets (FREESTYLE) lancets Check BS twice daily before meals, 250.00 100 each 12  . potassium chloride SA (K-DUR,KLOR-CON) 20 MEQ tablet Take 2 tablets (40 mEq total) by mouth daily. 3 tablet 0  . valACYclovir (VALTREX) 500 MG tablet TAKE 1 TABLET BY MOUTH EVERY DAY 30 tablet 2  .  diclofenac sodium (VOLTAREN) 1 % GEL Apply 2 g topically 4 (four) times daily. (Patient not taking: Reported on 09/08/2016) 100 g 2  . diphenhydrAMINE (BENADRYL) 25 MG tablet Take 1 tablet (25 mg total) by mouth every 6 (six) hours. (Patient not taking: Reported on 09/08/2016) 20 tablet 0  . Ombitas-Paritapre-Ritona-Dasab (VIEKIRA PAK) 12.5-75-50 &250 MG TBPK Take 1 Package by mouth 2 (two) times daily. (Patient not taking: Reported on 09/08/2016) 112 each 2  . predniSONE (DELTASONE) 20 MG tablet 3 tabs po daily x 3 days, then 2 tabs x 3 days, then 1.5 tabs x 3 days, then 1 tab x 3 days, then 0.5 tabs x 3 days (Patient not taking: Reported on 09/08/2016) 27 tablet 0   No current facility-administered medications on file prior to visit.    Family History  Problem Relation Age of Onset  . Diabetes Mother    Social History   Social History  . Marital status: Single    Spouse name: N/A  . Number of children: N/A  . Years of education: N/A   Occupational History  . Not on file.   Social History Main Topics  . Smoking status: Current Every Day Smoker    Packs/day: 0.25    Types: Cigarettes  . Smokeless tobacco: Never Used     Comment: admits to cuttting back  4 cigarettes/day  . Alcohol use No  . Drug use: No  . Sexual activity: Not on file   Other Topics Concern  . Not on file   Social History Narrative  . No narrative on file    Objective:   Vitals:   09/08/16 0907  BP: 134/88  Pulse: 81  Resp: 16  Temp: 98.1 F (36.7 C)    Filed Weights   09/08/16 0907  Weight: 176 lb 12.8 oz (80.2 kg)    BP Readings from Last 3 Encounters:  09/08/16 134/88  08/26/16 129/96  09/29/15 144/96    Physical Exam: Constitutional: Patient appears well-developed and well-nourished. No distress. AAOx3, pleasant HENT: Normocephalic, atraumatic, External right and left ear normal. Oropharynx is clear and moist.  Slight ceruminosis bilat ears w/o impaction. Eyes: Conjunctivae and EOM are  normal. PERRL, no scleral icterus. Neck: Normal ROM. Neck supple. No JVD. No tracheal deviation. No thyromegaly. CVS: RRR, S1/S2 +, no murmurs, no gallops, no carotid bruit.  Pulmonary: Effort and breath sounds normal, no stridor, rhonchi, wheezes, rales.  Abdominal: Soft. BS +, no distension, tenderness, rebound or guarding.  Musculoskeletal: Normal range of motion. No edema and no tenderness.  LE: bilat/ no c/c/e, pulses 2+ bilateral. Lymphadenopathy: No lymphadenopathy noted, cervical, inguinal or axillary Neuro: Alert.   Right Facial nerve cn7 palsy, w/ inability to close right eye, right facial droop.  nml bilat grip/strength, gait intact, no sensory deficits on bilat face.   muscle tone coordination wnl.  Skin: Skin is warm and dry. No rash noted. Not diaphoretic. No erythema. No pallor. Psychiatric: Normal mood and affect. Behavior, judgment, thought content normal.  Lab Results  Component Value Date   WBC 9.9 08/26/2016   HGB 14.4 08/26/2016   HCT 40.3 08/26/2016   MCV 91.2 08/26/2016   PLT 240 08/26/2016   Lab Results  Component Value Date   CREATININE 1.73 (H) 08/26/2016   BUN 30 (H) 08/26/2016   NA 141 08/26/2016   K 2.8 (L) 08/26/2016   CL 106 08/26/2016   CO2 25 08/26/2016    Lab Results  Component Value Date   HGBA1C 5.80 09/22/2015   Lipid Panel     Component Value Date/Time   CHOL 220 (H) 09/29/2015 1441   TRIG 263 (H) 09/29/2015 1441   HDL 45 (L) 09/29/2015 1441   CHOLHDL 4.9 09/29/2015 1441   VLDL 53 (H) 09/29/2015 1441   LDLCALC 122 09/29/2015 1441       Depression screen PHQ 2/9 09/22/2015 05/14/2015 08/30/2014 08/15/2014 08/14/2014  Decreased Interest 3 - 0 0 0  Down, Depressed, Hopeless _0 0  PHQ - 2 Score _1 0  Altered sleeping 3 - - - -  Tired, decreased energy 0 - - - -  Change in appetite 0 1 - - -  Feeling bad or failure about yourself  2 - - - -  Trouble concentrating 0 - - - -  Moving slowly or fidgety/restless 0 - - - -    Suicidal thoughts 2 0 - - -  PHQ-9 Score 13 - - - -    Assessment and plan:   1 Essential hypertension - good control, continue dash diet, increase exercise - cont norvasc 10 qd, stop hctz for now until eval renal function - dw pt plan   2. tob abuse - trial welbutrin 150bid, complete cessation recd, tips provided  3. Type 2 diabetes mellitus with diabetic polyneuropathy, without long-term current  use of insulin (HCC) - Glucose (CBG) - HgB A1c 6.7 - Ambulatory referral to Ophthalmology - gabapentin (NEURONTIN) 300 MG capsule; Take 1 capsule (300 mg total) by mouth daily as needed (for nerve pains in feet, only PRN).  Dispense: 180 capsule; Refill: 3  - recd to use sparingly until renal function improves. - urine micro - increase glipizide 5 qd  4. Bells palsy, right sided 08/26/16 ed visit, reassurance provided, full /almost complete recovery occurs for most patients, but takes time. - recd finish steroid course. - saline eye drops.  5. Chronic hepatitis C without hepatic coma (HCC) Treated per pt, saw DR Linus Salmons in past - amb ref ID for f/u, lost to f/u.  6. GAD (generalized anxiety disorder) /depression Mild, trial welbutrin to see if helps w/ this, depression and smoking cessation.  7. Screening breast examination - MM Digital Screening; Future  8. CKD (chronic kidney disease), stage 3 (moderate) - severe ckd 4-5 noted on labs 9/14, but may have been dehydrated, rechk, avoid nephrotoxic meds. - BASIC METABOLIC PANEL WITH GFR - Microalbumin/Creatinine Ratio, Urine  9. Vitamin D deficiency - VITAMIN D 25 Hydroxy (Vit-D Deficiency, Fractures)  10. Estrogen deficiency - DG Bone Density; Future  11. HLD (hyperlipidemia) - Lipid Panel  12. Healthcare maintenance chk tsh  13. Encounter for immunization - tdap today - Flu Vaccine QUAD 36+ mos IM   Return in about 3 weeks (around 09/29/2016) for pap, smoking/depression.  The patient was given clear instructions  to go to ER or return to medical center if symptoms don't improve, worsen or new problems develop. The patient verbalized understanding. The patient was told to call to get lab results if they haven't heard anything in the next week.    This note has been created with Surveyor, quantity. Any transcriptional errors are unintentional.   Maren Reamer, MD, [facility], [place]   09/08/2016, 10:02 AM

## 2016-09-09 ENCOUNTER — Other Ambulatory Visit: Payer: Self-pay | Admitting: Internal Medicine

## 2016-09-09 ENCOUNTER — Encounter: Payer: Self-pay | Admitting: Internal Medicine

## 2016-09-09 LAB — VITAMIN D 25 HYDROXY (VIT D DEFICIENCY, FRACTURES): VIT D 25 HYDROXY: 13 ng/mL — AB (ref 30–100)

## 2016-09-09 LAB — MICROALBUMIN / CREATININE URINE RATIO
CREATININE, URINE: 91 mg/dL (ref 20–320)
MICROALB UR: 30.9 mg/dL
Microalb Creat Ratio: 340 mcg/mg creat — ABNORMAL HIGH (ref <30)

## 2016-09-09 MED ORDER — VITAMIN D (ERGOCALCIFEROL) 1.25 MG (50000 UNIT) PO CAPS: 50000.0000 [IU] | ORAL_CAPSULE | ORAL | 0 refills | Status: DC

## 2016-09-09 NOTE — Progress Notes (Signed)
Pt now has requested refill for acyclovir 3x, once with me and once w/ my rn.  I declined both since she was already fully treated emp w/ this for her Bell's Palsy. Yesterday, I discuss w/ my rn that there was no indication or data to suggest repeating acyclovir will be of any benefit. Today, she requested it w/ my pharmacist.  Given her insistency for this medication, I renewed it for the placebo effect, if any.  Doubt much harm/risk w/ renewal. No studies to suggest will be of benefit though for renewal of it. I did emphasize that she finishes her steroid taper.

## 2016-09-15 ENCOUNTER — Telehealth: Payer: Self-pay

## 2016-09-15 NOTE — Telephone Encounter (Signed)
Contacted pt to go over lab results pt daughter is aware of results and medications that are at the pharmacy. Pt daughter doesn't have any questions or concerns

## 2016-09-21 ENCOUNTER — Ambulatory Visit
Admission: RE | Admit: 2016-09-21 | Discharge: 2016-09-21 | Disposition: A | Discharge status: Home / Self Care | Payer: Medicaid Other | Source: Ambulatory Visit | Attending: Internal Medicine | Admitting: Internal Medicine

## 2016-09-21 DIAGNOSIS — E2839 Other primary ovarian failure: Secondary | ICD-10-CM

## 2016-09-21 DIAGNOSIS — Z1239 Encounter for other screening for malignant neoplasm of breast: Secondary | ICD-10-CM

## 2016-09-22 ENCOUNTER — Other Ambulatory Visit: Payer: Self-pay

## 2016-09-22 ENCOUNTER — Telehealth: Payer: Self-pay

## 2016-09-22 MED ORDER — VITAMIN D (ERGOCALCIFEROL) 1.25 MG (50000 UNIT) PO CAPS: 50000.0000 [IU] | ORAL_CAPSULE | ORAL | 0 refills | Status: DC

## 2016-09-22 NOTE — Telephone Encounter (Signed)
Contacted pt to go over lab results spoke with pt daughter and is aware of rx vit D and doesn't have any questions or concerns

## 2016-09-24 ENCOUNTER — Telehealth: Payer: Self-pay | Admitting: *Deleted

## 2016-09-24 NOTE — Telephone Encounter (Signed)
Attempted to call patient to schedule her for new Hep C appt and no answer and no voice mail.

## 2016-10-06 ENCOUNTER — Telehealth: Payer: Self-pay

## 2016-10-06 NOTE — Telephone Encounter (Signed)
Contacted pt to go over lab results Pt daughter is aware of results and doesn't have any questions or concerns

## 2016-10-11 ENCOUNTER — Encounter: Payer: Self-pay | Admitting: Internal Medicine

## 2016-10-11 LAB — HM DIABETES EYE EXAM

## 2016-10-20 ENCOUNTER — Ambulatory Visit: Payer: Medicaid Other | Attending: Internal Medicine | Admitting: Internal Medicine

## 2016-10-20 ENCOUNTER — Encounter: Payer: Self-pay | Admitting: Internal Medicine

## 2016-10-20 VITALS — BP 144/96 | HR 87 | Temp 98.5°F | Resp 16 | Wt 178.4 lb

## 2016-10-20 DIAGNOSIS — N183 Chronic kidney disease, stage 3 unspecified: Secondary | ICD-10-CM

## 2016-10-20 DIAGNOSIS — I129 Hypertensive chronic kidney disease with stage 1 through stage 4 chronic kidney disease, or unspecified chronic kidney disease: Secondary | ICD-10-CM | POA: Insufficient documentation

## 2016-10-20 DIAGNOSIS — I10 Essential (primary) hypertension: Secondary | ICD-10-CM

## 2016-10-20 DIAGNOSIS — B182 Chronic viral hepatitis C: Secondary | ICD-10-CM | POA: Insufficient documentation

## 2016-10-20 DIAGNOSIS — F17210 Nicotine dependence, cigarettes, uncomplicated: Secondary | ICD-10-CM | POA: Insufficient documentation

## 2016-10-20 DIAGNOSIS — F17200 Nicotine dependence, unspecified, uncomplicated: Secondary | ICD-10-CM | POA: Diagnosis not present

## 2016-10-20 DIAGNOSIS — E1142 Type 2 diabetes mellitus with diabetic polyneuropathy: Secondary | ICD-10-CM | POA: Insufficient documentation

## 2016-10-20 DIAGNOSIS — Z124 Encounter for screening for malignant neoplasm of cervix: Secondary | ICD-10-CM

## 2016-10-20 DIAGNOSIS — E1122 Type 2 diabetes mellitus with diabetic chronic kidney disease: Secondary | ICD-10-CM | POA: Diagnosis not present

## 2016-10-20 LAB — BASIC METABOLIC PANEL WITH GFR
BUN: 16 mg/dL (ref 7–25)
CO2: 28 mmol/L (ref 20–31)
CREATININE: 1.21 mg/dL — AB (ref 0.50–0.99)
Calcium: 10.3 mg/dL (ref 8.6–10.4)
Chloride: 107 mmol/L (ref 98–110)
GFR, EST AFRICAN AMERICAN: 55 mL/min — AB (ref >=60)
GFR, Est Non African American: 48 mL/min — ABNORMAL LOW (ref >=60)
Glucose, Bld: 85 mg/dL (ref 65–99)
POTASSIUM: 3.6 mmol/L (ref 3.5–5.3)
SODIUM: 145 mmol/L (ref 135–146)

## 2016-10-20 LAB — GLUCOSE, POCT (MANUAL RESULT ENTRY): POC GLUCOSE: 105 mg/dL — AB (ref 70–99)

## 2016-10-20 MED ORDER — SENSITIVE EYES SALINE SOLN: 2.0000 "application " | Freq: Two times a day (BID) | 3 refills | Status: DC

## 2016-10-20 MED ORDER — VALACYCLOVIR HCL 500 MG PO TABS: 500.0000 mg | ORAL_TABLET | Freq: Every day | ORAL | 3 refills | Status: DC

## 2016-10-20 MED ORDER — GLIPIZIDE ER 5 MG PO TB24: 5.0000 mg | ORAL_TABLET | Freq: Every day | ORAL | 3 refills | Status: DC

## 2016-10-20 MED ORDER — ATORVASTATIN CALCIUM 10 MG PO TABS: 10.0000 mg | ORAL_TABLET | Freq: Every day | ORAL | 3 refills | Status: DC

## 2016-10-20 NOTE — Patient Instructions (Signed)
Fu Dr Beryle Quant - ID for hep c.  - Menopause is a normal process in which your reproductive ability comes to an end. This process happens gradually over a span of months to years, usually between the ages of 68 and 29. Menopause is complete when you have missed 12 consecutive menstrual periods. It is important to talk with your health care provider about some of the most common conditions that affect postmenopausal women, such as heart disease, cancer, and bone loss (osteoporosis). Adopting a healthy lifestyle and getting preventive care can help to promote your health and wellness. Those actions can also lower your chances of developing some of these common conditions. WHAT SHOULD I KNOW ABOUT MENOPAUSE? During menopause, you may experience a number of symptoms, such as:  Moderate-to-severe hot flashes.  Night sweats.  Decrease in sex drive.  Mood swings.  Headaches.  Tiredness.  Irritability.  Memory problems.  Insomnia. Choosing to treat or not to treat menopausal changes is an individual decision that you make with your health care provider. WHAT SHOULD I KNOW ABOUT HORMONE REPLACEMENT THERAPY AND SUPPLEMENTS? Hormone therapy products are effective for treating symptoms that are associated with menopause, such as hot flashes and night sweats. Hormone replacement carries certain risks, especially as you become older. If you are thinking about using estrogen or estrogen with progestin treatments, discuss the benefits and risks with your health care provider. WHAT SHOULD I KNOW ABOUT HEART DISEASE AND STROKE? Heart disease, heart attack, and stroke become more likely as you age. This may be due, in part, to the hormonal changes that your body experiences during menopause. These can affect how your body processes dietary fats, triglycerides, and cholesterol. Heart attack and stroke are both medical emergencies. There are many things that you can do to help prevent heart disease and  stroke:  Have your blood pressure checked at least every 1-2 years. High blood pressure causes heart disease and increases the risk of stroke.  If you are 42-43 years old, ask your health care provider if you should take aspirin to prevent a heart attack or a stroke.  Do not use any tobacco products, including cigarettes, chewing tobacco, or electronic cigarettes. If you need help quitting, ask your health care provider.  It is important to eat a healthy diet and maintain a healthy weight.  Be sure to include plenty of vegetables, fruits, low-fat dairy products, and lean protein.  Avoid eating foods that are high in solid fats, added sugars, or salt (sodium).  Get regular exercise. This is one of the most important things that you can do for your health.  Try to exercise for at least 150 minutes each week. The type of exercise that you do should increase your heart rate and make you sweat. This is known as moderate-intensity exercise.  Try to do strengthening exercises at least twice each week. Do these in addition to the moderate-intensity exercise.  Know your numbers.Ask your health care provider to check your cholesterol and your blood glucose. Continue to have your blood tested as directed by your health care provider. WHAT SHOULD I KNOW ABOUT CANCER SCREENING? There are several types of cancer. Take the following steps to reduce your risk and to catch any cancer development as early as possible. Breast Cancer  Practice breast self-awareness.  This means understanding how your breasts normally appear and feel.  It also means doing regular breast self-exams. Let your health care provider know about any changes, no matter how small.  If you are 40 or older, have a clinician do a breast exam (clinical breast exam or CBE) every year. Depending on your age, family history, and medical history, it may be recommended that you also have a yearly breast X-ray (mammogram).  If you have a  family history of breast cancer, talk with your health care provider about genetic screening.  If you are at high risk for breast cancer, talk with your health care provider about having an MRI and a mammogram every year.  Breast cancer (BRCA) gene test is recommended for women who have family members with BRCA-related cancers. Results of the assessment will determine the need for genetic counseling and BRCA1 and for BRCA2 testing. BRCA-related cancers include these types:  Breast. This occurs in males or females.  Ovarian.  Tubal. This may also be called fallopian tube cancer.  Cancer of the abdominal or pelvic lining (peritoneal cancer).  Prostate.  Pancreatic. Cervical, Uterine, and Ovarian Cancer Your health care provider may recommend that you be screened regularly for cancer of the pelvic organs. These include your ovaries, uterus, and vagina. This screening involves a pelvic exam, which includes checking for microscopic changes to the surface of your cervix (Pap test).  For women ages 21-65, health care providers may recommend a pelvic exam and a Pap test every three years. For women ages 34-65, they may recommend the Pap test and pelvic exam, combined with testing for human papilloma virus (HPV), every five years. Some types of HPV increase your risk of cervical cancer. Testing for HPV may also be done on women of any age who have unclear Pap test results.  Other health care providers may not recommend any screening for nonpregnant women who are considered low risk for pelvic cancer and have no symptoms. Ask your health care provider if a screening pelvic exam is right for you.  If you have had past treatment for cervical cancer or a condition that could lead to cancer, you need Pap tests and screening for cancer for at least 20 years after your treatment. If Pap tests have been discontinued for you, your risk factors (such as having a new sexual partner) need to be reassessed to  determine if you should start having screenings again. Some women have medical problems that increase the chance of getting cervical cancer. In these cases, your health care provider may recommend that you have screening and Pap tests more often.  If you have a family history of uterine cancer or ovarian cancer, talk with your health care provider about genetic screening.  If you have vaginal bleeding after reaching menopause, tell your health care provider.  There are currently no reliable tests available to screen for ovarian cancer. Lung Cancer Lung cancer screening is recommended for adults 52-42 years old who are at high risk for lung cancer because of a history of smoking. A yearly low-dose CT scan of the lungs is recommended if you:  Currently smoke.  Have a history of at least 30 pack-years of smoking and you currently smoke or have quit within the past 15 years. A pack-year is smoking an average of one pack of cigarettes per day for one year. Yearly screening should:  Continue until it has been 15 years since you quit.  Stop if you develop a health problem that would prevent you from having lung cancer treatment. Colorectal Cancer  This type of cancer can be detected and can often be prevented.  Routine colorectal cancer screening usually begins at  age 40 and continues through age 78.  If you have risk factors for colon cancer, your health care provider may recommend that you be screened at an earlier age.  If you have a family history of colorectal cancer, talk with your health care provider about genetic screening.  Your health care provider may also recommend using home test kits to check for hidden blood in your stool.  A small camera at the end of a tube can be used to examine your colon directly (sigmoidoscopy or colonoscopy). This is done to check for the earliest forms of colorectal cancer.  Direct examination of the colon should be repeated every 5-10 years until age  81. However, if early forms of precancerous polyps or small growths are found or if you have a family history or genetic risk for colorectal cancer, you may need to be screened more often. Skin Cancer  Check your skin from head to toe regularly.  Monitor any moles. Be sure to tell your health care provider:  About any new moles or changes in moles, especially if there is a change in a mole's shape or color.  If you have a mole that is larger than the size of a pencil eraser.  If any of your family members has a history of skin cancer, especially at a young age, talk with your health care provider about genetic screening.  Always use sunscreen. Apply sunscreen liberally and repeatedly throughout the day.  Whenever you are outside, protect yourself by wearing long sleeves, pants, a wide-brimmed hat, and sunglasses. WHAT SHOULD I KNOW ABOUT OSTEOPOROSIS? Osteoporosis is a condition in which bone destruction happens more quickly than new bone creation. After menopause, you may be at an increased risk for osteoporosis. To help prevent osteoporosis or the bone fractures that can happen because of osteoporosis, the following is recommended:  If you are 44-27 years old, get at least 1,000 mg of calcium and at least 600 mg of vitamin D per day.  If you are older than age 25 but younger than age 11, get at least 1,200 mg of calcium and at least 600 mg of vitamin D per day.  If you are older than age 46, get at least 1,200 mg of calcium and at least 800 mg of vitamin D per day. Smoking and excessive alcohol intake increase the risk of osteoporosis. Eat foods that are rich in calcium and vitamin D, and do weight-bearing exercises several times each week as directed by your health care provider. WHAT SHOULD I KNOW ABOUT HOW MENOPAUSE AFFECTS [place]? Depression may occur at any age, but it is more common as you become older. Common symptoms of depression include:  Low or sad mood.  Changes  in sleep patterns.  Changes in appetite or eating patterns.  Feeling an overall lack of motivation or enjoyment of activities that you previously enjoyed.  Frequent crying spells. Talk with your health care provider if you think that you are experiencing depression. WHAT SHOULD I KNOW ABOUT IMMUNIZATIONS? It is important that you get and maintain your immunizations. These include:  Tetanus, diphtheria, and pertussis (Tdap) booster vaccine.  Influenza every year before the flu season begins.  Pneumonia vaccine.  Shingles vaccine. Your health care provider may also recommend other immunizations.   This information is not intended to replace advice given to you by your health care provider. Make sure you discuss any questions you have with your health care provider.   Document Released: 01/21/2006 Document  Revised: 12/20/2014 Document Reviewed: 08/01/2014 Elsevier Interactive Patient Education [employer].

## 2016-10-20 NOTE — Progress Notes (Signed)
Pt is in the office today for a pap, smoking and depression Pt states she is not in any pain Pt states she is taking medication without difficulty

## 2016-10-20 NOTE — Progress Notes (Signed)
Mary Stephens, is a 60 y.o. female  HGD:[id number]  DQQ:[id number]  DOB - 10-15-1956  Chief Complaint  Patient presents with  . Gynecologic Exam  . Nicotine Dependence  . Depression        Subjective:   Mary Stephens is a 60 y.o. female here today for a follow up visit, last seen 9/27, here for pap.  However, pt would like to hold off on papsmear and have it done by OB.  Last pap was years ago.  Needs refills on her acyclovir. Also had numerous questions about her medications.  She only takes neurontin sparingly at night for nerve pain, and taking her glipizide.  She is trying to watch her foods, and bought some frozen vegs recently rather than canned goods.     Taking her cholesterol meds and norvasc10. Holding on hctz due to renal insufficiency at last visit.  Has eye eval last wk at Dr Katy Fitch last wk, no dm retinopathy noted.  Still smoking 1/3 ppd tob, hard time quitting. Never picked up welbutrin to try.  Mild depression since most family far away, but she is trying to find a church here. Denies si/hi/avh. Not interested in rx at this time.  Patient has No headache, No chest pain, No abdominal pain - No Nausea, No new weakness tingling or numbness, No Cough - SOB.  No problems updated.  ALLERGIES: No Known Allergies  PAST MEDICAL HISTORY: Past Medical History:  Diagnosis Date  . Anxiety   . Arthritis   . Diabetes mellitus without complication ([place])   . Hepatitis C   . Hypertension     MEDICATIONS AT HOME: Prior to Admission medications   Medication Sig Start Date End Date Taking? Authorizing Provider  amLODipine (NORVASC) 10 MG tablet Take 1 tablet (10 mg total) by mouth every morning. 09/08/16  Yes Maren Reamer, MD  atorvastatin (LIPITOR) 10 MG tablet Take 1 tablet (10 mg total) by mouth daily. 10/20/16  Yes Maren Reamer, MD  buPROPion (WELLBUTRIN SR) 150 MG 12 hr tablet Take 1 tablet (150 mg total) by mouth 2 (two) times daily. 09/08/16  Yes Maren Reamer, MD  carbamide peroxide (DEBROX) 6.5 % otic solution Place 5 drops into both ears 2 (two) times daily. 09/08/16  Yes Maren Reamer, MD  gabapentin (NEURONTIN) 300 MG capsule Take 1 capsule (300 mg total) by mouth daily as needed (for nerve pains in feet, only PRN). 09/08/16  Yes Maren Reamer, MD  glipiZIDE (GLUCOTROL XL) 5 MG 24 hr tablet Take 1 tablet (5 mg total) by mouth daily with breakfast. 10/20/16  Yes Maren Reamer, MD  glucose blood test strip Check BS twice daily, 250.00 09/12/14  Yes Lance Bosch, NP  glucose monitoring kit (FREESTYLE) monitoring kit 1 each by Does not apply route as needed for other. Please check BS twice daily before meals, 250.00 09/12/14  Yes Lance Bosch, NP  Lancets (FREESTYLE) lancets Check BS twice daily before meals, 250.00 09/12/14  Yes Lance Bosch, NP  Soft Lens Products (SENSITIVE EYES SALINE) SOLN 2 application by Does not apply route 2 (two) times daily. 10/20/16  Yes Maren Reamer, MD  valACYclovir (VALTREX) 500 MG tablet Take 1 tablet (500 mg total) by mouth daily. 10/20/16  Yes Dawn Lazarus Gowda, MD  hydrochlorothiazide (MICROZIDE) 12.5 MG capsule TAKE 1 CAPSULE (12.5 MG TOTAL) BY MOUTH EVERY MORNING. Patient not taking: Reported on 10/20/2016 08/11/16   Tresa Garter, MD  Vitamin D, Ergocalciferol, (DRISDOL) 50000 units CAPS capsule Take 1 capsule (50,000 Units total) by mouth every 7 (seven) days. Patient not taking: Reported on 10/20/2016 09/22/16   Maren Reamer, MD     Objective:   Vitals:   10/20/16 1006  BP: (!) 144/96  Pulse: 87  Resp: 16  Temp: 98.5 F (36.9 C)  TempSrc: Oral  SpO2: 96%  Weight: 178 lb 6.4 oz (80.9 kg)    Exam General appearance : Awake, alert, not in any distress. Speech Clear. Not toxic looking, pleasant. HEENT: Atraumatic and Normocephalic, pupils equally reactive to light. Neck: supple, no JVD.  Chest:Good air entry bilaterally, no added sounds. CVS: S1 S2 regular, no  murmurs/gallups or rubs. Abdomen: Bowel sounds active, Non tender and not distended Extremities: B/L Lower Ext shows no edema, both legs are warm to touch Neurology: Awake alert, and oriented X 3, CN II-XII grossly intact, Non focal Skin:No Rash  Data Review Lab Results  Component Value Date   HGBA1C 6.7 09/08/2016   HGBA1C 5.80 09/22/2015   HGBA1C 5.50 05/14/2015    Depression screen PHQ 2/9 10/20/2016 09/08/2016 09/22/2015 05/14/2015 08/30/2014  Decreased Interest 0 0 3 - 0  Down, Depressed, Hopeless (No Data) _0 PHQ - 2 Score 0 _1 Altered sleeping 0 3 3 - -  Tired, decreased energy 0 0 0 - -  Change in appetite 0 (No Data) 0 1 -  Feeling bad or failure about yourself  0 0 2 - -  Trouble concentrating 0 0 0 - -  Moving slowly or fidgety/restless 0 0 0 - -  Suicidal thoughts 0 0 2 0 -  PHQ-9 Score 0 5 13 - -      Assessment & Plan   1. Type 2 diabetes mellitus with diabetic polyneuropathy, without long-term current use of insulin (HCC) - low carb diet discussed, increase activity. - cont glipizide 5. - POCT glucose (manual entry) 105  2. Essential hypertension Slightly elevated still, but w/ new guidelines only slightly (goal < 150/90) - continue norvasc 10 - chk bmp, may be able to added hctz 12.5 back on.  3. Chronic hepatitis C without hepatic coma ([place]) She missed phn call from ID office, I recd she calls Dr Novella Olive for f/u apt.  4. Smoking Complete cessation recd, tips discussed, does not want to start Welbutrin at this time.  5. CKD stage 3 due to type 2 diabetes mellitus (HCC) - BASIC METABOLIC PANEL WITH GFR  6. Pap smear for cervical cancer screening Pt requested to have done at ob office, referral made. - Ambulatory referral to Obstetrics / Gynecology     Patient have been counseled extensively about nutrition and exercise  Return in about 3 months (around 01/20/2017).  The patient was given clear instructions to go to ER or return to  medical center if symptoms don't improve, worsen or new problems develop. The patient verbalized understanding. The patient was told to call to get lab results if they haven't heard anything in the next week.   This note has been created with Surveyor, quantity. Any transcriptional errors are unintentional.   Maren Reamer, MD, [place] [facility], [place]   10/20/2016, 11:19 AM

## 2016-10-26 ENCOUNTER — Telehealth: Payer: Self-pay | Admitting: *Deleted

## 2016-10-26 NOTE — Telephone Encounter (Signed)
Called patient's daughter back regarding upcoming appt with Dr. Linus Salmons for 11/16/16 and advised that her mother is cured of Hep C and per Dr. Linus Salmons no evidence of cirrhosis. Offered an appt with Onnie Boer, pharmacist if she would like and the daughter declined. Future appt cancelled.

## 2016-10-27 ENCOUNTER — Telehealth: Payer: Self-pay

## 2016-10-27 NOTE — Telephone Encounter (Signed)
Pt was called and informed of lab results. 

## 2016-11-16 ENCOUNTER — Ambulatory Visit: Payer: Medicaid Other | Admitting: Internal Medicine

## 2016-11-18 ENCOUNTER — Ambulatory Visit: Payer: Medicaid Other | Admitting: Internal Medicine

## 2016-12-10 ENCOUNTER — Encounter: Payer: Medicaid Other | Admitting: Family Medicine

## 2017-04-28 ENCOUNTER — Encounter: Payer: Self-pay | Admitting: Internal Medicine

## 2017-04-29 ENCOUNTER — Encounter: Payer: Self-pay | Admitting: Internal Medicine

## 2017-05-02 ENCOUNTER — Encounter: Payer: Self-pay | Admitting: Internal Medicine

## 2017-10-28 ENCOUNTER — Ambulatory Visit: Payer: Medicaid Other | Attending: Nurse Practitioner | Admitting: Nurse Practitioner

## 2017-10-28 ENCOUNTER — Encounter: Payer: Self-pay | Admitting: Nurse Practitioner

## 2017-10-28 VITALS — BP 155/94 | HR 98 | Temp 98.9°F | Resp 18 | Ht 68.0 in | Wt 164.4 lb

## 2017-10-28 DIAGNOSIS — Z7984 Long term (current) use of oral hypoglycemic drugs: Secondary | ICD-10-CM | POA: Diagnosis not present

## 2017-10-28 DIAGNOSIS — F17209 Nicotine dependence, unspecified, with unspecified nicotine-induced disorders: Secondary | ICD-10-CM

## 2017-10-28 DIAGNOSIS — Z716 Tobacco abuse counseling: Secondary | ICD-10-CM | POA: Diagnosis not present

## 2017-10-28 DIAGNOSIS — Z23 Encounter for immunization: Secondary | ICD-10-CM | POA: Diagnosis not present

## 2017-10-28 DIAGNOSIS — Z833 Family history of diabetes mellitus: Secondary | ICD-10-CM | POA: Insufficient documentation

## 2017-10-28 DIAGNOSIS — E785 Hyperlipidemia, unspecified: Secondary | ICD-10-CM | POA: Diagnosis present

## 2017-10-28 DIAGNOSIS — E1140 Type 2 diabetes mellitus with diabetic neuropathy, unspecified: Secondary | ICD-10-CM | POA: Insufficient documentation

## 2017-10-28 DIAGNOSIS — M1990 Unspecified osteoarthritis, unspecified site: Secondary | ICD-10-CM | POA: Diagnosis not present

## 2017-10-28 DIAGNOSIS — Z79899 Other long term (current) drug therapy: Secondary | ICD-10-CM | POA: Diagnosis not present

## 2017-10-28 DIAGNOSIS — B182 Chronic viral hepatitis C: Secondary | ICD-10-CM | POA: Diagnosis not present

## 2017-10-28 DIAGNOSIS — E559 Vitamin D deficiency, unspecified: Secondary | ICD-10-CM | POA: Diagnosis not present

## 2017-10-28 DIAGNOSIS — E1142 Type 2 diabetes mellitus with diabetic polyneuropathy: Secondary | ICD-10-CM | POA: Diagnosis not present

## 2017-10-28 DIAGNOSIS — I10 Essential (primary) hypertension: Secondary | ICD-10-CM | POA: Insufficient documentation

## 2017-10-28 DIAGNOSIS — Z90711 Acquired absence of uterus with remaining cervical stump: Secondary | ICD-10-CM | POA: Diagnosis not present

## 2017-10-28 DIAGNOSIS — E1169 Type 2 diabetes mellitus with other specified complication: Secondary | ICD-10-CM | POA: Diagnosis present

## 2017-10-28 DIAGNOSIS — F17210 Nicotine dependence, cigarettes, uncomplicated: Secondary | ICD-10-CM | POA: Diagnosis not present

## 2017-10-28 LAB — GLUCOSE, POCT (MANUAL RESULT ENTRY): POC GLUCOSE: 212 mg/dL — AB (ref 70–99)

## 2017-10-28 LAB — POCT GLYCOSYLATED HEMOGLOBIN (HGB A1C): HEMOGLOBIN A1C: 5.7

## 2017-10-28 MED ORDER — VALACYCLOVIR HCL 500 MG PO TABS: 500.0000 mg | ORAL_TABLET | Freq: Every day | ORAL | 3 refills | Status: AC

## 2017-10-28 MED ORDER — GLIPIZIDE ER 5 MG PO TB24: 5.0000 mg | ORAL_TABLET | Freq: Every day | ORAL | 3 refills | Status: DC

## 2017-10-28 MED ORDER — AMLODIPINE BESYLATE 10 MG PO TABS: 10.0000 mg | ORAL_TABLET | Freq: Every morning | ORAL | 3 refills | Status: DC

## 2017-10-28 MED ORDER — ATORVASTATIN CALCIUM 10 MG PO TABS: 10.0000 mg | ORAL_TABLET | Freq: Every day | ORAL | 3 refills | Status: DC

## 2017-10-28 MED ORDER — GABAPENTIN 300 MG PO CAPS: 300.0000 mg | ORAL_CAPSULE | Freq: Every day | ORAL | 3 refills | Status: DC | PRN

## 2017-10-28 MED ORDER — BUPROPION HCL ER (SR) 150 MG PO TB12: 150.0000 mg | ORAL_TABLET | Freq: Two times a day (BID) | ORAL | 2 refills | Status: DC

## 2017-10-28 MED ORDER — HYDROCHLOROTHIAZIDE 12.5 MG PO CAPS: ORAL_CAPSULE | ORAL | 0 refills | Status: DC

## 2017-10-28 NOTE — Progress Notes (Signed)
Assessment & Plan:  Mary Stephens was seen today for establish care.  Diagnoses and all orders for this visit:  Hyperlipidemia associated with type 2 diabetes mellitus ([place]) -     Microalbumin / creatinine urine ratio -     Lipid panel -     CBC -     Basic metabolic panel  Tobacco use disorder, continuous Wellbutrin XL 150 BID  Encounter for smoking cessation counseling Mary Stephens was counseled on the dangers of tobacco use, and was advised to quit. Reviewed strategies to maximize success, including removing cigarettes and smoking materials from environment, stress management and support of family/friends as well as pharmacological alternatives including: Wellbutrin, Chantix, Nicotine patch, Nicotine gum or lozenges. Smoking cessation support: smoking cessation hotline: 1-800-QUIT-NOW.  Smoking cessation classes are also available through [facility]. Call [phone] or visit our website at [email or web address]   Spent  6 minutes counseling on smoking cessation and patient is ready to quit.   Chronic hepatitis C without hepatic coma (HCC) Hep C viral load(not dectected) 6-05-15-15 after being treated with [facility]   Essential hypertension BP is elevated today. Patient to resume antihypertensives today after long period of non adherence to medication regimen. She does not own a BP monitor at home due to finances.  EDUCATED ON DASH DIET. Continue to exercise at least 150 minutes per week.      Subjective:   Chief Complaint  Patient presents with  . Establish Care    Patient need medication refills.   She is seen here today for establish care and medication refills. She has not taken any medication for "a long time". She has a history of HTN, HPL, DM and diabetic neuropathy.   Hypertension Patient here for follow-up of hypertension. She is exercising  (walks at least 2-3 miles every other day or so) and IS NOT adherent to low salt diet.  She does not have a   blood pressure log today.  Cardiac symptoms none. Patient denies chest pain, chest pressure/discomfort, claudication, dyspnea, exertional chest pressure/discomfort, irregular heart beat, lower extremity edema, near-syncope, orthopnea and paroxysmal nocturnal dyspnea.  Cardiovascular risk factors: diabetes mellitus, dyslipidemia, hypertension and smoking/ tobacco exposure. Use of agents associated with hypertension: none.  History of target organ damage: none. BP Readings from Last 3 Encounters: 10/28/17 : (!) 155/94 10/20/16 : (!) 144/96 09/08/16 : 134/88   Hyperlipidemia Patient presents for follow up to hyperlipidemia.  She is not medication or diet compliant and denies statin intolerance including myalgias.  Lab Results      Component                Value               Date                      CHOL                     186                 09/08/2016                CHOL                     220 (H)             09/29/2015                CHOL  171                 07/01/2014           Lab Results      Component                Value               Date                      HDL                      67                  09/08/2016                HDL                      45 (L)              09/29/2015                HDL                      39 (L)              07/01/2014           Lab Results      Component                Value               Date                      LDLCALC                  95                  09/08/2016                LDLCALC                  122                 09/29/2015                LDLCALC                  100 (H)             07/01/2014           Lab Results      Component                Value               Date                      TRIG                     120                 09/08/2016                TRIG                     263 (H)  09/29/2015                TRIG                     162 (H)             07/01/2014           Lab Results       Component                Value               Date                      CHOLHDL                  2.8                 09/08/2016                CHOLHDL                  4.9                 09/29/2015                CHOLHDL                  4.4                 07/01/2014             Diabetes Mellitus Type II Patient here for follow-up of Type 2 diabetes mellitus.  Current symptoms/problems include hyperglycemia and paresthesia of the feet and have been stable.  Known diabetic complications: cardiovascular disease Current diabetic medications include:  Eye exam current (within one year): no Weight trend: stable Prior visit with dietician: no Current monitoring regimen: office lab tests - 4 times yearly Home blood sugar records: she has not checked her blood sugars in over 6 months. Any episodes of hypoglycemia? no Is She on ACE inhibitor or angiotensin II receptor blocker?  No, awaiting microalbumin  Lab Results      Component                Value               Date                      HGBA1C                   6.7                 09/08/2016                HGBA1C                   5.80                09/22/2015                HGBA1C                   5.50                05/14/2015                Past Medical History:  Diagnosis Date  . Anxiety   . Arthritis   .  Diabetes mellitus without complication ([place])   . Hepatitis C   . Hypertension     Past Surgical History:  Procedure Laterality Date  . PARTIAL HYSTERECTOMY  2001    Family History  Problem Relation Age of Onset  . Diabetes Mother     Social History   Socioeconomic History  . Marital status: Single    Spouse name: Not on file  . Number of children: Not on file  . Years of education: Not on file  . Highest education level: Not on file  Social Needs  . Financial resource strain: Not on file  . Food insecurity - worry: Not on file  . Food insecurity - inability: Not on file  . Transportation needs -  medical: Not on file  . Transportation needs - non-medical: Not on file  Occupational History  . Not on file  Tobacco Use  . Smoking status: Current Every Day Smoker    Packs/day: 0.25    Types: Cigarettes  . Smokeless tobacco: Never Used  . Tobacco comment: admits to cuttting back 4 cigarettes/day  Substance and Sexual Activity  . Alcohol use: No  . Drug use: No  . Sexual activity: Not on file  Other Topics Concern  . Not on file  Social History Narrative  . Not on file    Outpatient Medications Prior to Visit  Medication Sig Dispense Refill  . glucose blood test strip Check BS twice daily, 250.00 100 each 12  . glucose monitoring kit (FREESTYLE) monitoring kit 1 each by Does not apply route as needed for other. Please check BS twice daily before meals, 250.00 1 each 0  . Lancets (FREESTYLE) lancets Check BS twice daily before meals, 250.00 100 each 12  . amLODipine (NORVASC) 10 MG tablet Take 1 tablet (10 mg total) by mouth every morning. 90 tablet 3  . gabapentin (NEURONTIN) 300 MG capsule Take 1 capsule (300 mg total) by mouth daily as needed (for nerve pains in feet, only PRN). 180 capsule 3  . glipiZIDE (GLUCOTROL XL) 5 MG 24 hr tablet Take 1 tablet (5 mg total) by mouth daily with breakfast. 90 tablet 3  . carbamide peroxide (DEBROX) 6.5 % otic solution Place 5 drops into both ears 2 (two) times daily. (Patient not taking: Reported on 10/28/2017) 15 mL 0  . Soft Lens Products (SENSITIVE EYES SALINE) SOLN 2 application by Does not apply route 2 (two) times daily. (Patient not taking: Reported on 10/28/2017) 1 Bottle 3  . Vitamin D, Ergocalciferol, (DRISDOL) 50000 units CAPS capsule Take 1 capsule (50,000 Units total) by mouth every 7 (seven) days. (Patient not taking: Reported on 10/20/2016) 12 capsule 0  . atorvastatin (LIPITOR) 10 MG tablet Take 1 tablet (10 mg total) by mouth daily. (Patient not taking: Reported on 10/28/2017) 90 tablet 3  . buPROPion (WELLBUTRIN SR) 150 MG 12  hr tablet Take 1 tablet (150 mg total) by mouth 2 (two) times daily. (Patient not taking: Reported on 10/28/2017) 60 tablet 2  . hydrochlorothiazide (MICROZIDE) 12.5 MG capsule TAKE 1 CAPSULE (12.5 MG TOTAL) BY MOUTH EVERY MORNING. (Patient not taking: Reported on 10/20/2016) 30 capsule 0  . valACYclovir (VALTREX) 500 MG tablet Take 1 tablet (500 mg total) by mouth daily. (Patient not taking: Reported on 10/28/2017) 90 tablet 3   No facility-administered medications prior to visit.     No Known Allergies  Review of Systems  Constitutional: Negative for fever, malaise/fatigue and weight loss.  HENT: Negative.  Negative for  nosebleeds.   Eyes: Negative.  Negative for blurred vision, double vision and photophobia.  Respiratory: Negative.  Negative for cough and shortness of breath.   Cardiovascular: Negative.  Negative for chest pain, palpitations and leg swelling.  Gastrointestinal: Negative.  Negative for abdominal pain, constipation, diarrhea, heartburn, nausea and vomiting.  Musculoskeletal: Negative.  Negative for myalgias.  Neurological: Positive for tingling and sensory change. Negative for dizziness, focal weakness, seizures and headaches.  Endo/Heme/Allergies: Negative for environmental allergies.  Psychiatric/Behavioral: Negative.  Negative for suicidal ideas.       Objective:    Physical Exam  Constitutional: She is oriented to person, place, and time. She appears well-developed and well-nourished. She is cooperative.  HENT:  Head: Normocephalic and atraumatic.  Eyes: EOM are normal.  Neck: Normal range of motion.  Cardiovascular: Normal rate, regular rhythm, normal heart sounds and intact distal pulses. Exam reveals no gallop and no friction rub.  No murmur heard. Pulmonary/Chest: Effort normal and breath sounds normal. No tachypnea. No respiratory distress. She has no decreased breath sounds. She has no wheezes. She has no rhonchi. She has no rales. She exhibits no  tenderness.  Abdominal: Soft. Bowel sounds are normal.  Musculoskeletal: Normal range of motion. She exhibits no edema.  Neurological: She is alert and oriented to person, place, and time. Coordination normal.  Skin: Skin is warm and dry.  Psychiatric: She has a normal mood and affect. Her behavior is normal. Judgment and thought content normal.  Nursing note and vitals reviewed.   BP (!) 155/94 (BP Location: Left Arm, Patient Position: Sitting, Cuff Size: Normal)   Pulse 98   Temp 98.9 F (37.2 C) (Oral)   Resp 18   Ht 5' 8"  (1.727 m)   Wt 164 lb 6.4 oz (74.6 kg)   SpO2 96%   BMI 25.00 kg/m  Wt Readings from Last 3 Encounters:  10/28/17 164 lb 6.4 oz (74.6 kg)  10/20/16 178 lb 6.4 oz (80.9 kg)  09/08/16 176 lb 12.8 oz (80.2 kg)    Patient has been counseled on age-appropriate routine health concerns for screening and prevention. These are reviewed and up-to-date. Referrals have been placed accordingly. Immunizations are up-to-date or declined.       Patient has been counseled extensively about nutrition and exercise as well as the importance of adherence with medications and regular follow-up. The patient was given clear instructions to go to ER or return to medical center if symptoms don't improve, worsen or new problems develop. The patient verbalized understanding.   Follow-up: Return in about 3 months (around 01/28/2018) for HTN/HPL/DM.   Gildardo Pounds, FNP-BC [facility] and [place], [place]   10/28/2017, 4:03 PM

## 2017-10-28 NOTE — Patient Instructions (Addendum)
Diabetes Mellitus and Standards of Medical Care Managing diabetes (diabetes mellitus) can be complicated. Your diabetes treatment may be managed by a team of health care providers, including:  A diet and nutrition specialist (registered dietitian).  A nurse.  A certified diabetes educator (CDE).  A diabetes specialist (endocrinologist).  An eye doctor.  A primary care provider.  A dentist.  Your health care providers follow a schedule in order to help you get the best quality of care. The following schedule is a general guideline for your diabetes management plan. Your health care providers may also give you more specific instructions. HbA1c ( hemoglobin A1c) test This test provides information about blood sugar (glucose) control over the previous 2-3 months. It is used to check whether your diabetes management plan needs to be adjusted.  If you are meeting your treatment goals, this test is done at least 2 times a year.  If you are not meeting treatment goals or if your treatment goals have changed, this test is done 4 times a year.  Blood pressure test  This test is done at every routine medical visit. For most people, the goal is less than 130/80. Ask your health care provider what your goal blood pressure should be. Dental and eye exams  Visit your dentist two times a year.  If you have type 1 diabetes, get an eye exam 3-5 years after you are diagnosed, and then once a year after your first exam. ? If you were diagnosed with type 1 diabetes as a child, get an eye exam when you are age 10 or older and have had diabetes for 3-5 years. After the first exam, you should get an eye exam once a year.  If you have type 2 diabetes, have an eye exam as soon as you are diagnosed, and then once a year after your first exam. Foot care exam  Visual foot exams are done at every routine medical visit. The exams check for cuts, bruises, redness, blisters, sores, or other problems with the  feet.  A complete foot exam is done by your health care provider once a year. This exam includes an inspection of the structure and skin of your feet, and a check of the pulses and sensation in your feet. ? Type 1 diabetes: Get your first exam 3-5 years after diagnosis. ? Type 2 diabetes: Get your first exam as soon as you are diagnosed.  Check your feet every day for cuts, bruises, redness, blisters, or sores. If you have any of these or other problems that are not healing, contact your health care provider. Kidney function test ( urine microalbumin)  This test is done once a year. ? Type 1 diabetes: Get your first test 5 years after diagnosis. ? Type 2 diabetes: Get your first test as soon as you are diagnosed.  If you have chronic kidney disease (CKD), get a serum creatinine and estimated glomerular filtration rate (eGFR) test once a year. Lipid profile (cholesterol, HDL, LDL, triglycerides)  This test should be done when you are diagnosed with diabetes, and every 5 years after the first test. If you are on medicines to lower your cholesterol, you may need to get this test done every year. ? The goal for LDL is less than 100 mg/dL (5.5 mmol/L). If you are at high risk, the goal is less than 70 mg/dL (3.9 mmol/L). ? The goal for HDL is 40 mg/dL (2.2 mmol/L) for men and 50 mg/dL(2.8 mmol/L) for women. An HDL   HDL cholesterol of 60 mg/dL (3.3 mmol/L) or higher gives some protection against heart disease. ? The goal for triglycerides is less than 150 mg/dL (8.3 mmol/L). Immunizations  The yearly flu (influenza) vaccine is recommended for everyone 6 months or older who has diabetes.  The pneumonia (pneumococcal) vaccine is recommended for everyone 2 years or older who has diabetes. If you are 67 or older, you may get the pneumonia vaccine as a series of two separate shots.  The hepatitis B vaccine is recommended for adults shortly after they have been diagnosed with diabetes.  The Tdap  (tetanus, diphtheria, and pertussis) vaccine should be given: ? According to normal childhood vaccination schedules, for children. ? Every 10 years, for adults who have diabetes.  The shingles vaccine is recommended for people who have had chicken pox and are 50 years or older. Mental and emotional health  Screening for symptoms of eating disorders, anxiety, and depression is recommended at the time of diagnosis and afterward as needed. If your screening shows that you have symptoms (you have a positive screening result), you may need further evaluation and be referred to a mental health care provider. Diabetes self-management education  Education about how to manage your diabetes is recommended at diagnosis and ongoing as needed. Treatment plan  Your treatment plan will be reviewed at every medical visit. Summary  Managing diabetes (diabetes mellitus) can be complicated. Your diabetes treatment may be managed by a team of health care providers.  Your health care providers follow a schedule in order to help you get the best quality of care.  Standards of care including having regular physical exams, blood tests, blood pressure monitoring, immunizations, screening tests, and education about how to manage your diabetes.  Your health care providers may also give you more specific instructions based on your individual health. This information is not intended to replace advice given to you by your health care provider. Make sure you discuss any questions you have with your health care provider. Document Released: 09/26/2009 Document Revised: 08/27/2016 Document Reviewed: 08/27/2016 Elsevier Interactive Patient Education  2018 [employer].   Diabetes and Foot Care Diabetes may cause you to have problems because of poor blood supply (circulation) to your feet and legs. This may cause the skin on your feet to become thinner, break easier, and heal more slowly. Your skin may become dry, and the  skin may peel and crack. You may also have nerve damage in your legs and feet causing decreased feeling in them. You may not notice minor injuries to your feet that could lead to infections or more serious problems. Taking care of your feet is one of the most important things you can do for yourself. Follow these instructions at home:  Wear shoes at all times, even in the house. Do not go barefoot. Bare feet are easily injured.  Check your feet daily for blisters, cuts, and redness. If you cannot see the bottom of your feet, use a mirror or ask someone for help.  Wash your feet with warm water (do not use hot water) and mild soap. Then pat your feet and the areas between your toes until they are completely dry. Do not soak your feet as this can dry your skin.  Apply a moisturizing lotion or petroleum jelly (that does not contain alcohol and is unscented) to the skin on your feet and to dry, brittle toenails. Do not apply lotion between your toes.  Trim your toenails straight across. Do not dig  under them or around the cuticle. File the edges of your nails with an emery board or nail file.  Do not cut corns or calluses or try to remove them with medicine.  Wear clean socks or stockings every day. Make sure they are not too tight. Do not wear knee-high stockings since they may decrease blood flow to your legs.  Wear shoes that fit properly and have enough cushioning. To break in new shoes, wear them for just a few hours a day. This prevents you from injuring your feet. Always look in your shoes before you put them on to be sure there are no objects inside.  Do not cross your legs. This may decrease the blood flow to your feet.  If you find a minor scrape, cut, or break in the skin on your feet, keep it and the skin around it clean and dry. These areas may be cleansed with mild soap and water. Do not cleanse the area with peroxide, alcohol, or iodine.  When you remove an adhesive bandage, be sure  not to damage the skin around it.  If you have a wound, look at it several times a day to make sure it is healing.  Do not use heating pads or hot water bottles. They may burn your skin. If you have lost feeling in your feet or legs, you may not know it is happening until it is too late.  Make sure your health care provider performs a complete foot exam at least annually or more often if you have foot problems. Report any cuts, sores, or bruises to your health care provider immediately. Contact a health care provider if:  You have an injury that is not healing.  You have cuts or breaks in the skin.  You have an ingrown nail.  You notice redness on your legs or feet.  You feel burning or tingling in your legs or feet.  You have pain or cramps in your legs and feet.  Your legs or feet are numb.  Your feet always feel cold. Get help right away if:  There is increasing redness, swelling, or pain in or around a wound.  There is a red line that goes up your leg.  Pus is coming from a wound.  You develop a fever or as directed by your health care provider.  You notice a bad smell coming from an ulcer or wound. This information is not intended to replace advice given to you by your health care provider. Make sure you discuss any questions you have with your health care provider. Document Released: 11/26/2000 Document Revised: 05/06/2016 Document Reviewed: 05/08/2013 Elsevier Interactive Patient Education  2017 [place] with Quitting Smoking Quitting smoking is a physical and mental challenge. You will face cravings, withdrawal symptoms, and temptation. Before quitting, work with your health care provider to make a plan that can help you cope. Preparation can help you quit and keep you from giving in. How can I cope with cravings? Cravings usually last for 5-10 minutes. If you get through it, the craving will pass. Consider taking the following actions to help you cope  with cravings:  Keep your mouth busy: ? Chew sugar-free gum. ? Suck on hard candies or a straw. ? Brush your teeth.  Keep your hands and body busy: ? Immediately change to a different activity when you feel a craving. ? Squeeze or play with a ball. ? Do an activity or a hobby, like making bead  jewelry, practicing needlepoint, or working with wood. ? Mix up your normal routine. ? Take a short exercise break. Go for a quick walk or run up and down stairs. ? Spend time in public places where smoking is not allowed.  Focus on doing something kind or helpful for someone else.  Call a friend or family member to talk during a craving.  Join a support group.  Call a quit line, such as 1-800-QUIT-NOW.  Talk with your health care provider about medicines that might help you cope with cravings and make quitting easier for you.  How can I deal with withdrawal symptoms? Your body may experience negative effects as it tries to get used to not having nicotine in the system. These effects are called withdrawal symptoms. They may include:  Feeling hungrier than normal.  Trouble concentrating.  Irritability.  Trouble sleeping.  Feeling depressed.  Restlessness and agitation.  Craving a cigarette.  To manage withdrawal symptoms:  Avoid places, people, and activities that trigger your cravings.  Remember why you want to quit.  Get plenty of sleep.  Avoid coffee and other caffeinated drinks. These may worsen some of your symptoms.  How can I handle social situations? Social situations can be difficult when you are quitting smoking, especially in the first few weeks. To manage this, you can:  Avoid parties, bars, and other social situations where people might be smoking.  Avoid alcohol.  Leave right away if you have the urge to smoke.  Explain to your family and friends that you are quitting smoking. Ask for understanding and support.  Plan activities with friends or family  where smoking is not an option.  What are some ways I can cope with stress? Wanting to smoke may cause stress, and stress can make you want to smoke. Find ways to manage your stress. Relaxation techniques can help. For example:  Breathe slowly and deeply, in through your nose and out through your mouth.  Listen to soothing, relaxing music.  Talk with a family member or friend about your stress.  Light a candle.  Soak in a bath or take a shower.  Think about a peaceful place.  What are some ways I can prevent weight gain? Be aware that many people gain weight after they quit smoking. However, not everyone does. To keep from gaining weight, have a plan in place before you quit and stick to the plan after you quit. Your plan should include:  Having healthy snacks. When you have a craving, it may help to: ? Eat plain popcorn, crunchy carrots, celery, or other cut vegetables. ? Chew sugar-free gum.  Changing how you eat: ? Eat small portion sizes at meals. ? Eat 4-6 small meals throughout the day instead of 1-2 large meals a day. ? Be mindful when you eat. Do not watch television or do other things that might distract you as you eat.  Exercising regularly: ? Make time to exercise each day. If you do not have time for a long workout, do short bouts of exercise for 5-10 minutes several times a day. ? Do some form of strengthening exercise, like weight lifting, and some form of aerobic exercise, like running or swimming.  Drinking plenty of water or other low-calorie or no-calorie drinks. Drink 6-8 glasses of water daily, or as much as instructed by your health care provider.  Summary  Quitting smoking is a physical and mental challenge. You will face cravings, withdrawal symptoms, and temptation to smoke again. Preparation  can help you as you go through these challenges.  You can cope with cravings by keeping your mouth busy (such as by chewing gum), keeping your body and hands busy,  and making calls to family, friends, or a helpline for people who want to quit smoking.  You can cope with withdrawal symptoms by avoiding places where people smoke, avoiding drinks with caffeine, and getting plenty of rest.  Ask your health care provider about the different ways to prevent weight gain, avoid stress, and handle social situations. This information is not intended to replace advice given to you by your health care provider. Make sure you discuss any questions you have with your health care provider. Document Released: 11/26/2016 Document Revised: 11/26/2016 Document Reviewed: 11/26/2016 Elsevier Interactive Patient Education  2018 [employer] Eating Plan DASH stands for "Dietary Approaches to Stop Hypertension." The DASH eating plan is a healthy eating plan that has been shown to reduce high blood pressure (hypertension). It may also reduce your risk for type 2 diabetes, heart disease, and stroke. The DASH eating plan may also help with weight loss. What are tips for following this plan? General guidelines  Avoid eating more than 2,300 mg (milligrams) of salt (sodium) a day. If you have hypertension, you may need to reduce your sodium intake to 1,500 mg a day.  Limit alcohol intake to no more than 1 drink a day for nonpregnant women and 2 drinks a day for men. One drink equals 12 oz of beer, 5 oz of wine, or 1 oz of hard liquor.  Work with your health care provider to maintain a healthy body weight or to lose weight. Ask what an ideal weight is for you.  Get at least 30 minutes of exercise that causes your heart to beat faster (aerobic exercise) most days of the week. Activities may include walking, swimming, or biking.  Work with your health care provider or diet and nutrition specialist (dietitian) to adjust your eating plan to your individual calorie needs. Reading food labels  Check food labels for the amount of sodium per serving. Choose foods with less than 5  percent of the Daily Value of sodium. Generally, foods with less than 300 mg of sodium per serving fit into this eating plan.  To find whole grains, look for the word "whole" as the first word in the ingredient list. Shopping  Buy products labeled as "low-sodium" or "no salt added."  Buy fresh foods. Avoid canned foods and premade or frozen meals. Cooking  Avoid adding salt when cooking. Use salt-free seasonings or herbs instead of table salt or sea salt. Check with your health care provider or pharmacist before using salt substitutes.  Do not fry foods. Cook foods using healthy methods such as baking, boiling, grilling, and broiling instead.  Cook with heart-healthy oils, such as olive, canola, soybean, or sunflower oil. Meal planning   Eat a balanced diet that includes: ? 5 or more servings of fruits and vegetables each day. At each meal, try to fill half of your plate with fruits and vegetables. ? Up to 6-8 servings of whole grains each day. ? Less than 6 oz of lean meat, poultry, or fish each day. A 3-oz serving of meat is about the same size as a deck of cards. One egg equals 1 oz. ? 2 servings of low-fat dairy each day. ? A serving of nuts, seeds, or beans 5 times each week. ? Heart-healthy fats. Healthy fats called Omega-3 fatty acids  are found in foods such as flaxseeds and coldwater fish, like sardines, salmon, and mackerel.  Limit how much you eat of the following: ? Canned or prepackaged foods. ? Food that is high in trans fat, such as fried foods. ? Food that is high in saturated fat, such as fatty meat. ? Sweets, desserts, sugary drinks, and other foods with added sugar. ? Full-fat dairy products.  Do not salt foods before eating.  Try to eat at least 2 vegetarian meals each week.  Eat more home-cooked food and less restaurant, buffet, and fast food.  When eating at a restaurant, ask that your food be prepared with less salt or no salt, if possible. What foods are  recommended? The items listed may not be a complete list. Talk with your dietitian about what dietary choices are best for you. Grains Whole-grain or whole-wheat bread. Whole-grain or whole-wheat pasta. Brown rice. [place]. Bulgur. Whole-grain and low-sodium cereals. Pita bread. Low-fat, low-sodium crackers. Whole-wheat flour tortillas. Vegetables Fresh or frozen vegetables (raw, steamed, roasted, or grilled). Low-sodium or reduced-sodium tomato and vegetable juice. Low-sodium or reduced-sodium tomato sauce and tomato paste. Low-sodium or reduced-sodium canned vegetables. Fruits All fresh, dried, or frozen fruit. Canned fruit in natural juice (without added sugar). Meat and other protein foods Skinless chicken or [place]. Ground chicken or [place]. Pork with fat trimmed off. Fish and seafood. Egg whites. Dried beans, peas, or lentils. Unsalted nuts, nut butters, and seeds. Unsalted canned beans. Lean cuts of beef with fat trimmed off. Low-sodium, lean deli meat. Dairy Low-fat (1%) or fat-free (skim) milk. Fat-free, low-fat, or reduced-fat cheeses. Nonfat, low-sodium ricotta or cottage cheese. Low-fat or nonfat yogurt. Low-fat, low-sodium cheese. Fats and oils Soft margarine without trans fats. Vegetable oil. Low-fat, reduced-fat, or light mayonnaise and salad dressings (reduced-sodium). Canola, safflower, olive, soybean, and sunflower oils. Avocado. Seasoning and other foods Herbs. Spices. Seasoning mixes without salt. Unsalted popcorn and pretzels. Fat-free sweets. What foods are not recommended? The items listed may not be a complete list. Talk with your dietitian about what dietary choices are best for you. Grains Baked goods made with fat, such as croissants, muffins, or some breads. Dry pasta or rice meal packs. Vegetables Creamed or fried vegetables. Vegetables in a cheese sauce. Regular canned vegetables (not low-sodium or reduced-sodium). Regular canned tomato sauce and paste (not  low-sodium or reduced-sodium). Regular tomato and vegetable juice (not low-sodium or reduced-sodium). Angie Fava. Olives. Fruits Canned fruit in a light or heavy syrup. Fried fruit. Fruit in cream or butter sauce. Meat and other protein foods Fatty cuts of meat. Ribs. Fried meat. Berniece Salines. Sausage. Bologna and other processed lunch meats. Salami. Fatback. Hotdogs. Bratwurst. Salted nuts and seeds. Canned beans with added salt. Canned or smoked fish. Whole eggs or egg yolks. Chicken or [place] with skin. Dairy Whole or 2% milk, cream, and half-and-half. Whole or full-fat cream cheese. Whole-fat or sweetened yogurt. Full-fat cheese. Nondairy creamers. Whipped toppings. Processed cheese and cheese spreads. Fats and oils Butter. Stick margarine. Lard. Shortening. Ghee. Bacon fat. Tropical oils, such as coconut, palm kernel, or palm oil. Seasoning and other foods Salted popcorn and pretzels. Onion salt, garlic salt, seasoned salt, table salt, and sea salt. Worcestershire sauce. Tartar sauce. Barbecue sauce. Teriyaki sauce. Soy sauce, including reduced-sodium. Steak sauce. Canned and packaged gravies. Fish sauce. Oyster sauce. Cocktail sauce. Horseradish that you find on the shelf. Ketchup. Mustard. Meat flavorings and tenderizers. Bouillon cubes. Hot sauce and Tabasco sauce. Premade or packaged marinades. Premade or packaged taco  seasonings. Relishes. Regular salad dressings. Where to find more information:  National Heart, Lung, and [place]: https://wilson-eaton.com/  American Heart Association: www.heart.org Summary  The DASH eating plan is a healthy eating plan that has been shown to reduce high blood pressure (hypertension). It may also reduce your risk for type 2 diabetes, heart disease, and stroke.  With the DASH eating plan, you should limit salt (sodium) intake to 2,300 mg a day. If you have hypertension, you may need to reduce your sodium intake to 1,500 mg a day.  When on the DASH eating plan,  aim to eat more fresh fruits and vegetables, whole grains, lean proteins, low-fat dairy, and heart-healthy fats.  Work with your health care provider or diet and nutrition specialist (dietitian) to adjust your eating plan to your individual calorie needs. This information is not intended to replace advice given to you by your health care provider. Make sure you discuss any questions you have with your health care provider. Document Released: 11/18/2011 Document Revised: 11/22/2016 Document Reviewed: 11/22/2016 Elsevier Interactive Patient Education  2017 [employer]  Dyslipidemia Dyslipidemia is an imbalance of waxy, fat-like substances (lipids) in the blood. The body needs lipids in small amounts. Dyslipidemia often involves a high level of cholesterol or triglycerides, which are types of lipids. Common forms of dyslipidemia include:  High levels of bad cholesterol (LDL cholesterol). LDL is the type of cholesterol that causes fatty deposits (plaques) to build up in the blood vessels that carry blood away from your heart (arteries).  Low levels of good cholesterol (HDL cholesterol). HDL cholesterol is the type of cholesterol that protects against heart disease. High levels of HDL remove the LDL buildup from arteries.  High levels of triglycerides. Triglycerides are a fatty substance in the blood that is linked to a buildup of plaques in the arteries.  You can develop dyslipidemia because of the genes you are born with (primary dyslipidemia) or changes that occur during your life (secondary dyslipidemia), or as a side effect of certain medical treatments. What are the causes? Primary dyslipidemia is caused by changes (mutations) in genes that are passed down through families (inherited). These mutations cause several types of dyslipidemia. Mutations can result in disorders that make the body produce too much LDL cholesterol or triglycerides, or not enough HDL cholesterol. These disorders may  lead to heart disease, arterial disease, or stroke at an early age. Causes of secondary dyslipidemia include certain lifestyle choices and diseases that lead to dyslipidemia, such as:  Eating a diet that is high in animal fat.  Not getting enough activity or exercise (having a sedentary lifestyle).  Having diabetes, kidney disease, liver disease, or thyroid disease.  Drinking large amounts of alcohol.  Using certain types of drugs.  What increases the risk? You may be at greater risk for dyslipidemia if you are an older man or if you are a woman who has gone through menopause. Other risk factors include:  Having a family history of dyslipidemia.  Taking certain medicines, including birth control pills, steroids, some diuretics, beta-blockers, and some medicines forHIV.  Smoking cigarettes.  Eating a high-fat diet.  Drinking large amounts of alcohol.  Having certain medical conditions such as diabetes, polycystic ovary syndrome (PCOS), pregnancy, kidney disease, liver disease, or hypothyroidism.  Not exercising regularly.  Being overweight or obese with too much belly fat.  What are the signs or symptoms? Dyslipidemia does not usually cause any symptoms. Very high lipid levels can cause fatty bumps under the skin (  xanthomas) or a white or gray ring around the black center (pupil) of the eye. Very high triglyceride levels can cause inflammation of the pancreas (pancreatitis). How is this diagnosed? Your health care provider may diagnose dyslipidemia based on a routine blood test (fasting blood test). Because most people do not have symptoms of the condition, this blood testing (lipid profile) is done on adults age 70 and older and is repeated every 5 years. This test checks:  Total cholesterol. This is a measure of the total amount of cholesterol in your blood, including LDL cholesterol, HDL cholesterol, and triglycerides. A healthy number is below 200.  LDL cholesterol. The  target number for LDL cholesterol is different for each person, depending on individual risk factors. For most people, a number below 100 is healthy. Ask your health care provider what your LDL cholesterol number should be.  HDL cholesterol. An HDL level of 60 or higher is best because it helps to protect against heart disease. A number below 24 for men or below 83 for women increases the risk for heart disease.  Triglycerides. A healthy triglyceride number is below 150.  If your lipid profile is abnormal, your health care provider may do other blood tests to get more information about your condition. How is this treated? Treatment depends on the type of dyslipidemia that you have and your other risk factors for heart disease and stroke. Your health care provider will have a target range for your lipid levels based on this information. For many people, treatment starts with lifestyle changes, such as diet and exercise. Your health care provider may recommend that you:  Get regular exercise.  Make changes to your diet.  Quit smoking if you smoke.  If diet changes and exercise do not help you reach your goals, your health care provider may also prescribe medicine to lower lipids. The most commonly prescribed type of medicine lowers your LDL cholesterol (statin drug). If you have a high triglyceride level, your provider may prescribe another type of drug (fibrate) or an omega-3 fish oil supplement, or both. Follow these instructions at home:  Take over-the-counter and prescription medicines only as told by your health care provider. This includes supplements.  Get regular exercise. Start an aerobic exercise and strength training program as told by your health care provider. Ask your health care provider what activities are safe for you. Your health care provider may recommend: ? 30 minutes of aerobic activity 4-6 days a week. Brisk walking is an example of aerobic activity. ? Strength training 2  days a week.  Eat a healthy diet as told by your health care provider. This can help you reach and maintain a healthy weight, lower your LDL cholesterol, and raise your HDL cholesterol. It may help to work with a diet and nutrition specialist (dietitian) to make a plan that is right for you. Your dietitian or health care provider may recommend: ? Limiting your calories, if you are overweight. ? Eating more fruits, vegetables, whole grains, fish, and lean meats. ? Limiting saturated fat, trans fat, and cholesterol.  Follow instructions from your health care provider or dietitian about eating or drinking restrictions.  Limit alcohol intake to no more than one drink per day for nonpregnant women and two drinks per day for men. One drink equals 12 oz of beer, 5 oz of wine, or 1 oz of hard liquor.  Do not use any products that contain nicotine or tobacco, such as cigarettes and e-cigarettes. If you need  help quitting, ask your health care provider.  Keep all follow-up visits as told by your health care provider. This is important. Contact a health care provider if:  You are having trouble sticking to your exercise or diet plan.  You are struggling to quit smoking or control your use of alcohol. Summary  Dyslipidemia is an imbalance of waxy, fat-like substances (lipids) in the blood. The body needs lipids in small amounts. Dyslipidemia often involves a high level of cholesterol or triglycerides, which are types of lipids.  Treatment depends on the type of dyslipidemia that you have and your other risk factors for heart disease and stroke.  For many people, treatment starts with lifestyle changes, such as diet and exercise. Your health care provider may also prescribe medicine to lower lipids. This information is not intended to replace advice given to you by your health care provider. Make sure you discuss any questions you have with your health care provider. Document Released: 12/04/2013  Document Revised: 07/26/2016 Document Reviewed: 07/26/2016 Elsevier Interactive Patient Education  2018 [employer].  Diabetes Mellitus and Standards of Medical Care Managing diabetes (diabetes mellitus) can be complicated. Your diabetes treatment may be managed by a team of health care providers, including:  A diet and nutrition specialist (registered dietitian).  A nurse.  A certified diabetes educator (CDE).  A diabetes specialist (endocrinologist).  An eye doctor.  A primary care provider.  A dentist.  Your health care providers follow a schedule in order to help you get the best quality of care. The following schedule is a general guideline for your diabetes management plan. Your health care providers may also give you more specific instructions. HbA1c ( hemoglobin A1c) test This test provides information about blood sugar (glucose) control over the previous 2-3 months. It is used to check whether your diabetes management plan needs to be adjusted.  If you are meeting your treatment goals, this test is done at least 2 times a year.  If you are not meeting treatment goals or if your treatment goals have changed, this test is done 4 times a year.  Blood pressure test  This test is done at every routine medical visit. For most people, the goal is less than 130/80. Ask your health care provider what your goal blood pressure should be. Dental and eye exams  Visit your dentist two times a year.  If you have type 1 diabetes, get an eye exam 3-5 years after you are diagnosed, and then once a year after your first exam. ? If you were diagnosed with type 1 diabetes as a child, get an eye exam when you are age 4 or older and have had diabetes for 3-5 years. After the first exam, you should get an eye exam once a year.  If you have type 2 diabetes, have an eye exam as soon as you are diagnosed, and then once a year after your first exam. Foot care exam  Visual foot exams are  done at every routine medical visit. The exams check for cuts, bruises, redness, blisters, sores, or other problems with the feet.  A complete foot exam is done by your health care provider once a year. This exam includes an inspection of the structure and skin of your feet, and a check of the pulses and sensation in your feet. ? Type 1 diabetes: Get your first exam 3-5 years after diagnosis. ? Type 2 diabetes: Get your first exam as soon as you are diagnosed.  Check  your feet every day for cuts, bruises, redness, blisters, or sores. If you have any of these or other problems that are not healing, contact your health care provider. Kidney function test ( urine microalbumin)  This test is done once a year. ? Type 1 diabetes: Get your first test 5 years after diagnosis. ? Type 2 diabetes: Get your first test as soon as you are diagnosed.  If you have chronic kidney disease (CKD), get a serum creatinine and estimated glomerular filtration rate (eGFR) test once a year. Lipid profile (cholesterol, HDL, LDL, triglycerides)  This test should be done when you are diagnosed with diabetes, and every 5 years after the first test. If you are on medicines to lower your cholesterol, you may need to get this test done every year. ? The goal for LDL is less than 100 mg/dL (5.5 mmol/L). If you are at high risk, the goal is less than 70 mg/dL (3.9 mmol/L). ? The goal for HDL is 40 mg/dL (2.2 mmol/L) for men and 50 mg/dL(2.8 mmol/L) for women. An HDL cholesterol of 60 mg/dL (3.3 mmol/L) or higher gives some protection against heart disease. ? The goal for triglycerides is less than 150 mg/dL (8.3 mmol/L). Immunizations  The yearly flu (influenza) vaccine is recommended for everyone 6 months or older who has diabetes.  The pneumonia (pneumococcal) vaccine is recommended for everyone 2 years or older who has diabetes. If you are 97 or older, you may get the pneumonia vaccine as a series of two separate  shots.  The hepatitis B vaccine is recommended for adults shortly after they have been diagnosed with diabetes.  The Tdap (tetanus, diphtheria, and pertussis) vaccine should be given: ? According to normal childhood vaccination schedules, for children. ? Every 10 years, for adults who have diabetes.  The shingles vaccine is recommended for people who have had chicken pox and are 50 years or older. Mental and emotional health  Screening for symptoms of eating disorders, anxiety, and depression is recommended at the time of diagnosis and afterward as needed. If your screening shows that you have symptoms (you have a positive screening result), you may need further evaluation and be referred to a mental health care provider. Diabetes self-management education  Education about how to manage your diabetes is recommended at diagnosis and ongoing as needed. Treatment plan  Your treatment plan will be reviewed at every medical visit. Summary  Managing diabetes (diabetes mellitus) can be complicated. Your diabetes treatment may be managed by a team of health care providers.  Your health care providers follow a schedule in order to help you get the best quality of care.  Standards of care including having regular physical exams, blood tests, blood pressure monitoring, immunizations, screening tests, and education about how to manage your diabetes.  Your health care providers may also give you more specific instructions based on your individual health. This information is not intended to replace advice given to you by your health care provider. Make sure you discuss any questions you have with your health care provider. Document Released: 09/26/2009 Document Revised: 08/27/2016 Document Reviewed: 08/27/2016 Elsevier Interactive Patient Education  [employer].

## 2017-10-29 LAB — CBC
Hematocrit: 41.6 % (ref 34.0–46.6)
Hemoglobin: 14.2 g/dL (ref 11.1–15.9)
MCH: 31.5 pg (ref 26.6–33.0)
MCHC: 34.1 g/dL (ref 31.5–35.7)
MCV: 92 fL (ref 79–97)
Platelets: 255 10*3/uL (ref 150–379)
RBC: 4.51 x10E6/uL (ref 3.77–5.28)
RDW: 13 % (ref 12.3–15.4)
WBC: 7.9 10*3/uL (ref 3.4–10.8)

## 2017-10-29 LAB — CMP14+EGFR
A/G RATIO: 1.5 (ref 1.2–2.2)
ALT: 11 IU/L (ref 0–32)
AST: 16 IU/L (ref 0–40)
Albumin: 4.5 g/dL (ref 3.6–4.8)
Alkaline Phosphatase: 121 IU/L — ABNORMAL HIGH (ref 39–117)
BUN/Creatinine Ratio: 11 — ABNORMAL LOW (ref 12–28)
BUN: 18 mg/dL (ref 8–27)
Bilirubin Total: 0.2 mg/dL (ref 0.0–1.2)
CALCIUM: 10 mg/dL (ref 8.7–10.3)
CO2: 26 mmol/L (ref 20–29)
Chloride: 104 mmol/L (ref 96–106)
Creatinine, Ser: 1.6 mg/dL — ABNORMAL HIGH (ref 0.57–1.00)
GFR, EST AFRICAN AMERICAN: 39 mL/min/{1.73_m2} — AB (ref >59)
GFR, EST NON AFRICAN AMERICAN: 34 mL/min/{1.73_m2} — AB (ref >59)
GLOBULIN, TOTAL: 3.1 g/dL (ref 1.5–4.5)
Glucose: 110 mg/dL — ABNORMAL HIGH (ref 65–99)
POTASSIUM: 3.3 mmol/L — AB (ref 3.5–5.2)
Sodium: 147 mmol/L — ABNORMAL HIGH (ref 134–144)
TOTAL PROTEIN: 7.6 g/dL (ref 6.0–8.5)

## 2017-10-29 LAB — VITAMIN D 25 HYDROXY (VIT D DEFICIENCY, FRACTURES): Vit D, 25-Hydroxy: 14 ng/mL — ABNORMAL LOW (ref 30.0–100.0)

## 2017-10-29 LAB — MICROALBUMIN / CREATININE URINE RATIO
Creatinine, Urine: 186.3 mg/dL
Microalb/Creat Ratio: 1877 mg/g creat — ABNORMAL HIGH (ref 0.0–30.0)
Microalbumin, Urine: 3496.9 ug/mL

## 2017-10-29 LAB — LIPID PANEL
CHOL/HDL RATIO: 4.6 ratio — AB (ref 0.0–4.4)
Cholesterol, Total: 227 mg/dL — ABNORMAL HIGH (ref 100–199)
HDL: 49 mg/dL (ref >39)
LDL CALC: 141 mg/dL — AB (ref 0–99)
TRIGLYCERIDES: 183 mg/dL — AB (ref 0–149)
VLDL CHOLESTEROL CAL: 37 mg/dL (ref 5–40)

## 2017-10-31 ENCOUNTER — Other Ambulatory Visit: Payer: Self-pay | Admitting: Nurse Practitioner

## 2017-10-31 ENCOUNTER — Telehealth: Payer: Self-pay

## 2017-10-31 MED ORDER — LISINOPRIL 10 MG PO TABS: 10.0000 mg | ORAL_TABLET | Freq: Every day | ORAL | 3 refills | Status: DC

## 2017-10-31 MED ORDER — VITAMIN D (ERGOCALCIFEROL) 1.25 MG (50000 UNIT) PO CAPS: 50000.0000 [IU] | ORAL_CAPSULE | ORAL | 0 refills | Status: DC

## 2017-10-31 MED ORDER — POTASSIUM CHLORIDE ER 10 MEQ PO TBCR: 10.0000 meq | EXTENDED_RELEASE_TABLET | Freq: Every day | ORAL | 0 refills | Status: DC

## 2017-10-31 NOTE — Telephone Encounter (Signed)
-----   Message from Gildardo Pounds, NP sent at 10/31/2017 10:34 AM EST ----- Your kidneys are being damaged by your diabetes. Will send in a prescription for a medication called lisinopril to help with your blood pressure and provide kidney protection. Lipids are elevated. Make sure you are taking your cholesterol medicine. I have also sent in your prescription for Vitamin D. You will take this once weekly for the next 12 weeks and then we will re evaluate. Potassium is low as well. Will send prescription for potassium for you to take daily. Please make sure you are drinking plenty of water as your sodium levels are also abnormal. Will need you to make a lab appointment in 3 weeks.

## 2017-11-01 ENCOUNTER — Telehealth: Payer: Self-pay | Admitting: Nurse Practitioner

## 2017-11-01 NOTE — Telephone Encounter (Signed)
pt called returning you call for lab result, please call her back

## 2017-11-01 NOTE — Telephone Encounter (Signed)
-----   Message from Gildardo Pounds, NP sent at 10/31/2017 10:34 AM EST ----- Your kidneys are being damaged by your diabetes. Will send in a prescription for a medication called lisinopril to help with your blood pressure and provide kidney protection. Lipids are elevated. Make sure you are taking your cholesterol medicine. I have also sent in your prescription for Vitamin D. You will take this once weekly for the next 12 weeks and then we will re evaluate. Potassium is low as well. Will send prescription for potassium for you to take daily. Please make sure you are drinking plenty of water as your sodium levels are also abnormal. Will need you to make a lab appointment in 3 weeks.

## 2017-11-01 NOTE — Telephone Encounter (Signed)
Patient's daughter informed on patient lab result. Patient's daughter is aware to pick up new Rx for patient.

## 2017-11-01 NOTE — Telephone Encounter (Signed)
CMA attempted to call patient regarding lab results and new Rx. Patient did not answer, and left patient a message to call back.   Sent a communication to patient address.

## 2017-12-13 ENCOUNTER — Emergency Department (HOSPITAL_COMMUNITY)
Admission: EM | Admit: 2017-12-13 | Discharge: 2017-12-13 | Disposition: A | Discharge status: Home / Self Care | Payer: Medicaid Other | Attending: Emergency Medicine | Admitting: Emergency Medicine

## 2017-12-13 ENCOUNTER — Other Ambulatory Visit: Payer: Self-pay

## 2017-12-13 ENCOUNTER — Encounter (HOSPITAL_COMMUNITY): Payer: Self-pay | Admitting: Emergency Medicine

## 2017-12-13 DIAGNOSIS — W208XXA Other cause of strike by thrown, projected or falling object, initial encounter: Secondary | ICD-10-CM | POA: Insufficient documentation

## 2017-12-13 DIAGNOSIS — Z79899 Other long term (current) drug therapy: Secondary | ICD-10-CM | POA: Diagnosis not present

## 2017-12-13 DIAGNOSIS — S161XXA Strain of muscle, fascia and tendon at neck level, initial encounter: Secondary | ICD-10-CM | POA: Diagnosis not present

## 2017-12-13 DIAGNOSIS — E119 Type 2 diabetes mellitus without complications: Secondary | ICD-10-CM | POA: Diagnosis not present

## 2017-12-13 DIAGNOSIS — I10 Essential (primary) hypertension: Secondary | ICD-10-CM | POA: Diagnosis not present

## 2017-12-13 DIAGNOSIS — F17210 Nicotine dependence, cigarettes, uncomplicated: Secondary | ICD-10-CM | POA: Diagnosis not present

## 2017-12-13 DIAGNOSIS — Y9301 Activity, walking, marching and hiking: Secondary | ICD-10-CM | POA: Diagnosis not present

## 2017-12-13 DIAGNOSIS — Y999 Unspecified external cause status: Secondary | ICD-10-CM | POA: Insufficient documentation

## 2017-12-13 DIAGNOSIS — S0990XA Unspecified injury of head, initial encounter: Secondary | ICD-10-CM | POA: Insufficient documentation

## 2017-12-13 DIAGNOSIS — Y92008 Other place in unspecified non-institutional (private) residence as the place of occurrence of the external cause: Secondary | ICD-10-CM | POA: Insufficient documentation

## 2017-12-13 MED ORDER — ACETAMINOPHEN 500 MG PO TABS: 1000.0000 mg | ORAL_TABLET | Freq: Four times a day (QID) | ORAL | 0 refills | Status: DC | PRN

## 2017-12-13 MED ORDER — CYCLOBENZAPRINE HCL 10 MG PO TABS: 10.0000 mg | ORAL_TABLET | Freq: Two times a day (BID) | ORAL | 0 refills | Status: DC | PRN

## 2017-12-13 NOTE — ED Triage Notes (Signed)
Pt reports that the drywall ceiling in her kitchen collapsed one hour ago. No obvious swelling or trauma noted. Pt was pushed toward the doorway striking l/shoulder. Denies LOC, dizziness.  Pt also reports seeing bright red blood on tissue when she wipes her rectum. Noted this for at least a week. Denies seeing blood in toilet or stools.

## 2017-12-13 NOTE — ED Provider Notes (Signed)
[facility] DEPT Provider Note   CSN: [id number] Arrival date & time: 12/13/17  1207     History   Chief Complaint Chief Complaint  Patient presents with  . Headache  . Neck Pain  . Rectal Bleeding    HPI Mary Stephens is a 62 y.o. female.  HPI Patient reports that her ceiling has had a leak in it for a while.  Mary Stephens is asked management twice to look at it to repair.  This morning Mary Stephens reports that Mary Stephens was walking through her living room toward the bedroom when a large chunk of ceiling fell out and struck her on the back of her head and neck.  Mary Stephens reports that thrust her forward to so her right shoulder struck the door jam.  Mary Stephens did not strike her head on the ground but the drywall piece did not strike her on the occiput. No  Loss of consciousness.  Happened approximately 10 AM.  Patient reports Mary Stephens is continued to have a dull posterior headache.  Mary Stephens reports tenderness along both sides of her neck.  No weakness numbness or tingling.  No gait instability.  No nausea no vomiting.  No visual changes.  Patient and review of systems reports that Mary Stephens has been noting bright red blood after Mary Stephens has a bowel movement when Mary Stephens wipes.  Ports the stool is normal in appearance and then when Mary Stephens wipes Mary Stephens notes a bit of bright blood.  Been going on for several weeks.  Mary Stephens reports that Mary Stephens is scheduling an appointment with her doctor.  Mary Stephens reports Mary Stephens had colonoscopies in the past.  Mary Stephens reports Mary Stephens has had benign polyps but no cancer. Past Medical History:  Diagnosis Date  . Anxiety   . Arthritis   . Diabetes mellitus without complication ([place])   . Hepatitis C   . Hypertension     Patient Active Problem List   Diagnosis Date Noted  . HTN (hypertension) 05/14/2015  . Smoking 12/27/2014  . DM (diabetes mellitus), type 2 ([place]) 09/12/2014  . Chronic hepatitis C virus infection ([name]) 08/14/2014    Past Surgical History:  Procedure Laterality Date  . PARTIAL  HYSTERECTOMY  2001    OB History    No data available       Home Medications    Prior to Admission medications   Medication Sig Start Date End Date Taking? Authorizing Provider  acetaminophen (TYLENOL) 500 MG tablet Take 2 tablets (1,000 mg total) by mouth every 6 (six) hours as needed. 12/13/17   Charlesetta Shanks, MD  amLODipine (NORVASC) 10 MG tablet Take 1 tablet (10 mg total) every morning by mouth. 10/28/17   Gildardo Pounds, NP  atorvastatin (LIPITOR) 10 MG tablet Take 1 tablet (10 mg total) daily by mouth. 10/28/17   Gildardo Pounds, NP  buPROPion [facility] SR) 150 MG 12 hr tablet Take 1 tablet (150 mg total) 2 (two) times daily by mouth. 10/28/17   Gildardo Pounds, NP  carbamide peroxide (DEBROX) 6.5 % otic solution Place 5 drops into both ears 2 (two) times daily. Patient not taking: Reported on 10/28/2017 09/08/16   Maren Reamer, MD  cyclobenzaprine (FLEXERIL) 10 MG tablet Take 1 tablet (10 mg total) by mouth 2 (two) times daily as needed for muscle spasms. 12/13/17   Charlesetta Shanks, MD  gabapentin (NEURONTIN) 300 MG capsule Take 1 capsule (300 mg total) daily as needed by mouth (for nerve pains in feet, only PRN). 10/28/17  Gildardo Pounds, NP  glipiZIDE (GLUCOTROL XL) 5 MG 24 hr tablet Take 1 tablet (5 mg total) daily with breakfast by mouth. 10/28/17   Gildardo Pounds, NP  glucose blood test strip Check BS twice daily, 250.00 09/12/14   Lance Bosch, NP  glucose monitoring kit (FREESTYLE) monitoring kit 1 each by Does not apply route as needed for other. Please check BS twice daily before meals, 250.00 09/12/14   Lance Bosch, NP  Lancets (FREESTYLE) lancets Check BS twice daily before meals, 250.00 09/12/14   Lance Bosch, NP  lisinopril (PRINIVIL,ZESTRIL) 10 MG tablet Take 1 tablet (10 mg total) daily by mouth. 10/31/17   Gildardo Pounds, NP  potassium chloride (K-DUR) 10 MEQ tablet Take 1 tablet (10 mEq total) daily by mouth. 10/31/17 11/30/17  Gildardo Pounds, NP  Soft Lens Products (SENSITIVE EYES SALINE) SOLN 2 application by Does not apply route 2 (two) times daily. Patient not taking: Reported on 10/28/2017 10/20/16   Maren Reamer, MD  valACYclovir (VALTREX) 500 MG tablet Take 1 tablet (500 mg total) daily by mouth. 10/28/17 01/26/18  Gildardo Pounds, NP  Vitamin D, Ergocalciferol, (DRISDOL) 50000 units CAPS capsule Take 1 capsule (50,000 Units total) every 7 (seven) days by mouth. 10/31/17   Gildardo Pounds, NP    Family History Family History  Problem Relation Age of Onset  . Diabetes Mother     Social History Social History   Tobacco Use  . Smoking status: Current Every Day Smoker    Packs/day: 0.25    Types: Cigarettes  . Smokeless tobacco: Never Used  . Tobacco comment: admits to cuttting back 4 cigarettes/day  Substance Use Topics  . Alcohol use: No  . Drug use: No     Allergies   Patient has no known allergies.   Review of Systems Review of Systems 10 Systems reviewed and are negative for acute change except as noted in the HPI.   Physical Exam Updated Vital Signs BP (!) 167/99 (BP Location: Right Arm)   Pulse 92   Temp 98.1 F (36.7 C) (Oral)   Resp 16   Wt 72.6 kg (160 lb)   SpO2 94%   BMI 24.33 kg/m   Physical Exam  Constitutional: Mary Stephens is oriented to person, place, and time. Mary Stephens appears well-developed and well-nourished. No distress.  Patient is alert and nontoxic.  Mary Stephens is well in appearance.  Mary Stephens is sitting in a chair in the room no acute distress.  Mary Stephens is using the phone.  HENT:  Head: Normocephalic and atraumatic.  Right Ear: External ear normal.  Left Ear: External ear normal.  Nose: Nose normal.  Mouth/Throat: Oropharynx is clear and moist.  Patient has diffusely tender area to the occipital area of the scalp.  No contusions or abrasions.  No facial contusions or abrasions.  Normal range of motion of jaw.  Eyes: Conjunctivae and EOM are normal. Pupils are equal, round, and reactive to  light.  Neck: Neck supple.  Patient endorses tenderness on the bilateral paraspinous muscle bodies particularly at the trapezius bilaterally.  He does have faint, linear erythematous streaks that are symmetric to each side of the trapezius.  This could correspond to contusions from the edges of a flat surface.  Patient has normal range of motion of the neck.  Mary Stephens does not have pain over the bony prominences.  Cardiovascular: Normal rate and regular rhythm.  No murmur heard. Pulmonary/Chest: Effort normal and breath sounds  normal. No respiratory distress. Mary Stephens exhibits no tenderness.  Abdominal: Soft. Mary Stephens exhibits no distension. There is no tenderness. There is no guarding.  Musculoskeletal: Normal range of motion. Mary Stephens exhibits no edema or tenderness.  Lower extremities no peripheral edema.  Calves are soft and nontender.  Normal range of motion of extremities.  Patient is ambulatory without difficulty.  Neurological: Mary Stephens is alert and oriented to person, place, and time. No cranial nerve deficit. Mary Stephens exhibits normal muscle tone. Coordination normal.  Skin: Skin is warm and dry.  Psychiatric: Mary Stephens has a normal mood and affect.  Nursing note and vitals reviewed.    ED Treatments / Results  Labs (all labs ordered are listed, but only abnormal results are displayed) Labs Reviewed - No data to display  EKG  EKG Interpretation None       Radiology No results found.  Procedures Procedures (including critical care time)  Medications Ordered in ED Medications - No data to display   Initial Impression / Assessment and Plan / ED Course  I have reviewed the triage vital signs and the nursing notes.  Pertinent labs & imaging results that were available during my care of the patient were reviewed by me and considered in my medical decision making (see chart for details).     Final Clinical Impressions(s) / ED Diagnoses   Final diagnoses:  Injury of head, initial encounter  Cervical  strain, acute, initial encounter  At this time, low suspicion for intracranial injury associated with today's event.  Patient is not on anticoagulants.  Mary Stephens did not have loss of consciousness.  No confusion.  Mary Stephens has normal neurologic exam.  Description of piece of drywall fell onto her upper back and struck the back of her head.  Mary Stephens did not have secondary injury through falling or striking her head on the wall.  Patient mentions secondary complaint and review of systems for red blood with wiping after bowel movement.  This sounds likely benign fissure or internal hemorrhoid.  Patient has had 2 prior colonoscopies.  Patient is however advised Mary Stephens must follow-up with her PCP for review of her colonoscopies and determination if Mary Stephens needs any repeat agnostic evaluation for this.  No emergent findings regarding rectal bleeding after bowel movement.  ED Discharge Orders        Ordered    acetaminophen (TYLENOL) 500 MG tablet  Every 6 hours PRN     12/13/17 1641    cyclobenzaprine (FLEXERIL) 10 MG tablet  2 times daily PRN     12/13/17 1641       Charlesetta Shanks, MD 12/13/17 1652

## 2017-12-13 NOTE — ED Notes (Signed)
Pt called from the lobby with no response 

## 2017-12-13 NOTE — Discharge Instructions (Signed)
1.  Take a combination of extra strength Tylenol and Flexeril for neck strain and headache.  Apply cool compresses to the back of your neck and head every few hours for the next 2 days. 2.  Follow-up with your family doctor to discuss your rectal bleeding and any need for repeat colonoscopy.

## 2018-01-27 ENCOUNTER — Encounter: Payer: Self-pay | Admitting: Nurse Practitioner

## 2018-01-27 ENCOUNTER — Ambulatory Visit: Payer: Medicaid Other | Attending: Nurse Practitioner | Admitting: Nurse Practitioner

## 2018-01-27 VITALS — BP 158/112 | HR 95 | Temp 98.3°F | Ht 68.0 in | Wt 169.8 lb

## 2018-01-27 DIAGNOSIS — F17210 Nicotine dependence, cigarettes, uncomplicated: Secondary | ICD-10-CM | POA: Diagnosis not present

## 2018-01-27 DIAGNOSIS — E876 Hypokalemia: Secondary | ICD-10-CM | POA: Diagnosis not present

## 2018-01-27 DIAGNOSIS — F419 Anxiety disorder, unspecified: Secondary | ICD-10-CM | POA: Diagnosis not present

## 2018-01-27 DIAGNOSIS — K625 Hemorrhage of anus and rectum: Secondary | ICD-10-CM | POA: Diagnosis present

## 2018-01-27 DIAGNOSIS — Z7984 Long term (current) use of oral hypoglycemic drugs: Secondary | ICD-10-CM | POA: Insufficient documentation

## 2018-01-27 DIAGNOSIS — F17200 Nicotine dependence, unspecified, uncomplicated: Secondary | ICD-10-CM

## 2018-01-27 DIAGNOSIS — B182 Chronic viral hepatitis C: Secondary | ICD-10-CM | POA: Diagnosis not present

## 2018-01-27 DIAGNOSIS — E1142 Type 2 diabetes mellitus with diabetic polyneuropathy: Secondary | ICD-10-CM

## 2018-01-27 DIAGNOSIS — I10 Essential (primary) hypertension: Secondary | ICD-10-CM | POA: Insufficient documentation

## 2018-01-27 DIAGNOSIS — Z716 Tobacco abuse counseling: Secondary | ICD-10-CM | POA: Diagnosis not present

## 2018-01-27 DIAGNOSIS — Z90710 Acquired absence of both cervix and uterus: Secondary | ICD-10-CM | POA: Insufficient documentation

## 2018-01-27 DIAGNOSIS — Z79899 Other long term (current) drug therapy: Secondary | ICD-10-CM | POA: Insufficient documentation

## 2018-01-27 DIAGNOSIS — E559 Vitamin D deficiency, unspecified: Secondary | ICD-10-CM | POA: Diagnosis not present

## 2018-01-27 LAB — GLUCOSE, POCT (MANUAL RESULT ENTRY): POC GLUCOSE: 149 mg/dL — AB (ref 70–99)

## 2018-01-27 MED ORDER — METOPROLOL SUCCINATE ER 50 MG PO TB24
50.0000 mg | ORAL_TABLET | Freq: Every day | ORAL | 3 refills | Status: DC
Start: 2018-01-27 — End: 2018-07-07

## 2018-01-27 MED ORDER — ATORVASTATIN CALCIUM 10 MG PO TABS: 10.0000 mg | ORAL_TABLET | Freq: Every day | ORAL | 3 refills | Status: DC

## 2018-01-27 MED ORDER — GLIPIZIDE ER 5 MG PO TB24: 5.0000 mg | ORAL_TABLET | Freq: Every day | ORAL | 3 refills | Status: DC

## 2018-01-27 MED ORDER — POTASSIUM CHLORIDE ER 10 MEQ PO TBCR: 10.0000 meq | EXTENDED_RELEASE_TABLET | Freq: Every day | ORAL | 1 refills | Status: DC

## 2018-01-27 MED ORDER — AMLODIPINE BESYLATE 10 MG PO TABS: 10.0000 mg | ORAL_TABLET | Freq: Every morning | ORAL | 3 refills | Status: DC

## 2018-01-27 MED FILL — ATORVASTATIN 10 MG TABLET: 10 | 90 days supply | Qty: 90 | Fill #0

## 2018-01-27 MED FILL — POTASSIUM CL 10 MEQ TAB SA: 10 | 30 days supply | Qty: 30 | Fill #0

## 2018-01-27 MED FILL — glipiZIDE XL 5 MG TB24: 5 | 90 days supply | Qty: 90 | Fill #0

## 2018-01-27 MED FILL — METOPROLOL SUCCINATE ER 50: 50 | 30 days supply | Qty: 30 | Fill #0

## 2018-01-27 MED FILL — AMLODIPINE BESYLATE 10 MG T: 10 | 90 days supply | Qty: 90 | Fill #0

## 2018-01-27 NOTE — Patient Instructions (Addendum)
Rectal Bleeding °Rectal bleeding is when blood comes out of the opening of the butt (anus). People with this kind of bleeding may notice bright red blood in their underwear or in the toilet after they poop (have a bowel movement). They may also have dark red or black poop (stool). Rectal bleeding is often a sign that something is wrong. It needs to be checked by a doctor. °Follow these instructions at home: °Watch for any changes in your condition. Take these actions to help with bleeding and discomfort: °· Eat a diet that is high in fiber. This will keep your poop soft so it is easier for you to poop without pushing too hard. Ask your doctor to tell you what foods and drinks are high in fiber. °· Drink enough fluid to keep your pee (urine) clear or pale yellow. This also helps keep your poop soft. °· Try taking a warm bath. This may help with pain. °· Keep all follow-up visits as told by your doctor. This is important. ° °Get help right away if: °· You have new bleeding. °· You have more bleeding than before. °· You have black or dark red poop. °· You throw up (vomit) blood or something that looks like coffee grounds. °· You have pain or tenderness in your belly (abdomen). °· You have a fever. °· You feel weak. °· You feel sick to your stomach (nauseous). °· You pass out (faint). °· You have very bad pain in your butt. °· You cannot poop. °This information is not intended to replace advice given to you by your health care provider. Make sure you discuss any questions you have with your health care provider. °Document Released: 08/11/2011 Document Revised: 05/06/2016 Document Reviewed: 01/25/2016 °Elsevier Interactive Patient Education © 2018 [employer] ° °

## 2018-01-27 NOTE — Progress Notes (Signed)
Assessment & Plan:  Angle was seen today for follow-up and rectal bleeding.  Diagnoses and all orders for this visit:  Type 2 diabetes mellitus with diabetic polyneuropathy, without long-term current use of insulin (HCC) -     Glucose (CBG) -     Ambulatory referral to Ophthalmology -     CMP14+EGFR -     Hemoglobin A1c -     atorvastatin (LIPITOR) 10 MG tablet; Take 1 tablet (10 mg total) by mouth daily. Diabetes Mellitus, Type 2 : Continue medications. Keep blood sugar logs as instructed.   glipiZIDE (GLUCOTROL XL) 5 MG 24 hr tablet; Take 1 tablet (5 mg total) by mouth daily with breakfast.  Essential hypertension -     CMP14+EGFR -     amLODipine (NORVASC) 10 MG tablet; Take 1 tablet (10 mg total) by mouth every morning. -     metoprolol succinate (TOPROL-XL) 50 MG 24 hr tablet; Take 1 tablet (50 mg total) by mouth daily. Take with or immediately following a meal.  HOLD Lisinopril at this time. Awaiting LABS.  Continue all antihypertensives as prescribed.  Remember to bring in your blood pressure log with you for your follow up appointment.  DASH/Mediterranean Diets are healthier choices for HTN.    Tobacco dependence Sabena was counseled on the dangers of tobacco use, and was advised to quit. Reviewed strategies to maximize success, including removing cigarettes and smoking materials from environment, stress management and support of family/friends as well as pharmacological alternatives including: Wellbutrin, Chantix, Nicotine patch, Nicotine gum or lozenges. Smoking cessation support: smoking cessation hotline: 1-800-QUIT-NOW.  Smoking cessation classes are also available through [facility] and [facility]. Call [phone] or visit our website at [email or web address]   Spent 5 minutes counseling on smoking cessation and patient is not ready to quit.  Chronic hepatitis C without hepatic coma (HCC) Resolved  Rectal bleeding -     Cancel: Ambulatory referral to  Gastroenterology -     CBC -     Ambulatory referral to Gastroenterology  Vitamin D deficiency -     VITAMIN D 25 Hydroxy (Vit-D Deficiency, Fractures)  Hypokalemia -     potassium chloride (K-DUR) 10 MEQ tablet; Take 1 tablet (10 mEq total) by mouth daily.     Patient has been counseled on age-appropriate routine health concerns for screening and prevention. These are reviewed and up-to-date. Referrals have been placed accordingly. Immunizations are up-to-date or declined.    Subjective:   Chief Complaint  Patient presents with  . Follow-up    Patient is here for a follow up on HTN/HPL/DM.   Marland Kitchen Rectal Bleeding    Patient stated when she wipes she see blood from her rectal.    HPI RASHAN PATIENT 62 y.o. female presents to office today for follow up.  Essential Hypertension She endorses medication compliance and states she took her blood pressure medications this morning.  However she did eat a pork barbecue sandwich right before her office appointment. She is very upset about her blood pressure being elevated and states "something is not right. I am going to have to go get a second opinion".  We discussed nutrition and salt intake. She was not aware that sodium could increase her blood pressure. Denies chest pain, shortness of breath, palpitations, lightheadedness, dizziness, headaches or BLE edema. She continues to smoke cigarettes. BP Readings from Last 3 Encounters:  01/27/18 (!) 158/112  12/13/17 (!) 184/121  10/28/17 (!) 155/94    Hyperlipidemia She  endorses medication compliance and takes her cholesterol medicine every morning. She denies statin intolerance or myalgias.  She is not diet or exercise compliant.  Lab Results  Component Value Date   CHOL 227 (H) 10/28/2017   HDL 49 10/28/2017   LDLCALC 141 (H) 10/28/2017   TRIG 183 (H) 10/28/2017   CHOLHDL 4.6 (H) 10/28/2017    Diabetes Mellitus Type 2 Checking her blood sugars "every now and then" with average glucose  140-150. She does not have her glucometer with her today. She denies hypo or hyperglycemia. Her eye exam is not current.   Vitamin D deficiency She completed her vitamin D prescription.Will recheck vitamin D today.    Hypokalemia She is taking potassium sporadically and reports "they told me it wasn't good to take potassium". When I questioned who was they she states "well that's just what I heard". I explained to her how hypokalemia can cause serious cardiac arrhythmias. She denies any palpitations    Rectal Bleeding Endorsing spots of blood in her underwear and after she has a bowel movement she sees blood on the toilet tissue. She denies seeing any blood in the toilet or in her stools.  Onset was 2-3 months ago. She denies any vaginal blood. She has had colonoscopies in the past with benign polyps. She can not recall when she was supposed to follow up with GI. She will be referred to GI. She denies chronic constipation, hemorrhoids, diarrhea, melena or hematochezia.    Hep C Per ID notes 10-26-2016: Called patient's daughter back regarding upcoming appt with Dr. Linus Salmons for 11/16/16 and advised that her mother is cured of Hep C and per Dr. Linus Salmons no evidence of cirrhosis. Offered an appt with Onnie Boer, pharmacist if she would like and the daughter declined. Future appt cancelled.  Tobacco Dependence She is not taking the Wellbutrin as prescribed. States I take it every now and then when I'm want to stop smoking. I instructed her that she should not skip or miss doses of wellbutrin. I offered additional smoking cessation aids however she declines.  Review of Systems  Constitutional: Negative for fever, malaise/fatigue and weight loss.  HENT: Negative.  Negative for nosebleeds.   Eyes: Negative.  Negative for blurred vision, double vision and photophobia.  Respiratory: Negative.  Negative for cough and shortness of breath.   Cardiovascular: Negative.  Negative for chest pain, palpitations and  leg swelling.  Gastrointestinal: Positive for abdominal pain and blood in stool. Negative for constipation, diarrhea, heartburn, nausea and vomiting.       SEE HPI  Musculoskeletal: Negative.  Negative for myalgias.  Neurological: Negative.  Negative for dizziness, focal weakness, seizures and headaches.  Endo/Heme/Allergies: Negative for environmental allergies.  Psychiatric/Behavioral: Negative.  Negative for suicidal ideas.    Past Medical History:  Diagnosis Date  . Anxiety   . Arthritis   . Diabetes mellitus without complication ([place])   . Hepatitis C   . Hypertension     Past Surgical History:  Procedure Laterality Date  . PARTIAL HYSTERECTOMY  2001    Family History  Problem Relation Age of Onset  . Diabetes Mother     Social History Reviewed with no changes to be made today.   Outpatient Medications Prior to Visit  Medication Sig Dispense Refill  . acetaminophen (TYLENOL) 500 MG tablet Take 2 tablets (1,000 mg total) by mouth every 6 (six) hours as needed. 30 tablet 0  . buPROPion (WELLBUTRIN SR) 150 MG 12 hr tablet Take 1 tablet (  150 mg total) 2 (two) times daily by mouth. 60 tablet 2  . gabapentin (NEURONTIN) 300 MG capsule Take 1 capsule (300 mg total) daily as needed by mouth (for nerve pains in feet, only PRN). 180 capsule 3  . glucose blood test strip Check BS twice daily, 250.00 100 each 12  . glucose monitoring kit (FREESTYLE) monitoring kit 1 each by Does not apply route as needed for other. Please check BS twice daily before meals, 250.00 1 each 0  . Lancets (FREESTYLE) lancets Check BS twice daily before meals, 250.00 100 each 12  . Vitamin D, Ergocalciferol, (DRISDOL) 50000 units CAPS capsule Take 1 capsule (50,000 Units total) every 7 (seven) days by mouth. 12 capsule 0  . amLODipine (NORVASC) 10 MG tablet Take 1 tablet (10 mg total) every morning by mouth. 90 tablet 3  . atorvastatin (LIPITOR) 10 MG tablet Take 1 tablet (10 mg total) daily by mouth. 90  tablet 3  . glipiZIDE (GLUCOTROL XL) 5 MG 24 hr tablet Take 1 tablet (5 mg total) daily with breakfast by mouth. 90 tablet 3  . lisinopril (PRINIVIL,ZESTRIL) 10 MG tablet Take 1 tablet (10 mg total) daily by mouth. 90 tablet 3  . cyclobenzaprine (FLEXERIL) 10 MG tablet Take 1 tablet (10 mg total) by mouth 2 (two) times daily as needed for muscle spasms. (Patient not taking: Reported on 01/27/2018) 20 tablet 0  . Soft Lens Products (SENSITIVE EYES SALINE) SOLN 2 application by Does not apply route 2 (two) times daily. (Patient not taking: Reported on 10/28/2017) 1 Bottle 3  . carbamide peroxide (DEBROX) 6.5 % otic solution Place 5 drops into both ears 2 (two) times daily. (Patient not taking: Reported on 10/28/2017) 15 mL 0  . potassium chloride (K-DUR) 10 MEQ tablet Take 1 tablet (10 mEq total) daily by mouth. 30 tablet 0   No facility-administered medications prior to visit.     No Known Allergies     Objective:    BP (!) 158/112 (BP Location: Right Arm, Cuff Size: Normal)   Pulse 95   Temp 98.3 F (36.8 C) (Oral)   Ht 5' 8"  (1.727 m)   Wt 169 lb 12.8 oz (77 kg)   SpO2 96%   BMI 25.82 kg/m  Wt Readings from Last 3 Encounters:  01/27/18 169 lb 12.8 oz (77 kg)  12/13/17 160 lb (72.6 kg)  10/28/17 164 lb 6.4 oz (74.6 kg)    Physical Exam  Constitutional: She is oriented to person, place, and time. She appears well-developed and well-nourished. She is cooperative.  HENT:  Head: Normocephalic and atraumatic.  Eyes: EOM are normal.  Neck: Normal range of motion.  Cardiovascular: Normal rate, regular rhythm, normal heart sounds and intact distal pulses. Exam reveals no gallop and no friction rub.  No murmur heard. Pulmonary/Chest: Effort normal and breath sounds normal. No tachypnea. No respiratory distress. She has no decreased breath sounds. She has no wheezes. She has no rhonchi. She has no rales. She exhibits no tenderness.  Abdominal: Soft. Bowel sounds are normal. She exhibits  no distension and no mass. There is no tenderness. There is no rebound and no guarding.  Musculoskeletal: Normal range of motion. She exhibits no edema.  Neurological: She is alert and oriented to person, place, and time. Coordination normal.  Skin: Skin is warm and dry.  Sensory exam of the foot is normal, tested with the monofilament. Good pulses, no lesions or ulcers, good peripheral pulses.  Psychiatric: She has a normal  mood and affect. Her behavior is normal. Judgment and thought content normal.  Nursing note and vitals reviewed.     Patient has been counseled extensively about nutrition and exercise as well as the importance of adherence with medications and regular follow-up. The patient was given clear instructions to go to ER or return to medical center if symptoms don't improve, worsen or new problems develop. The patient verbalized understanding.   Follow-up: Return for BP recheck.  2 weeks  Gildardo Pounds, FNP-BC [facility] and [address] [place], [place]   01/27/2018, 10:02 PM

## 2018-01-28 ENCOUNTER — Other Ambulatory Visit: Payer: Self-pay | Admitting: Nurse Practitioner

## 2018-01-28 DIAGNOSIS — E876 Hypokalemia: Secondary | ICD-10-CM

## 2018-01-28 DIAGNOSIS — E559 Vitamin D deficiency, unspecified: Secondary | ICD-10-CM

## 2018-01-28 DIAGNOSIS — N183 Chronic kidney disease, stage 3 unspecified: Secondary | ICD-10-CM

## 2018-01-28 LAB — CMP14+EGFR
ALT: 13 IU/L (ref 0–32)
AST: 15 IU/L (ref 0–40)
Albumin/Globulin Ratio: 1.4 (ref 1.2–2.2)
Albumin: 4.4 g/dL (ref 3.6–4.8)
Alkaline Phosphatase: 112 IU/L (ref 39–117)
BUN/Creatinine Ratio: 14 (ref 12–28)
BUN: 21 mg/dL (ref 8–27)
Bilirubin Total: 0.3 mg/dL (ref 0.0–1.2)
CO2: 22 mmol/L (ref 20–29)
Calcium: 9.6 mg/dL (ref 8.7–10.3)
Chloride: 106 mmol/L (ref 96–106)
Creatinine, Ser: 1.53 mg/dL — ABNORMAL HIGH (ref 0.57–1.00)
GFR calc Af Amer: 41 mL/min/{1.73_m2} — ABNORMAL LOW (ref >59)
GFR calc non Af Amer: 36 mL/min/{1.73_m2} — ABNORMAL LOW (ref >59)
Globulin, Total: 3.2 g/dL (ref 1.5–4.5)
Glucose: 91 mg/dL (ref 65–99)
Potassium: 3.4 mmol/L — ABNORMAL LOW (ref 3.5–5.2)
Sodium: 148 mmol/L — ABNORMAL HIGH (ref 134–144)
Total Protein: 7.6 g/dL (ref 6.0–8.5)

## 2018-01-28 LAB — CBC
HEMATOCRIT: 41.1 % (ref 34.0–46.6)
HEMOGLOBIN: 13.9 g/dL (ref 11.1–15.9)
MCH: 31.7 pg (ref 26.6–33.0)
MCHC: 33.8 g/dL (ref 31.5–35.7)
MCV: 94 fL (ref 79–97)
Platelets: 250 10*3/uL (ref 150–379)
RBC: 4.39 x10E6/uL (ref 3.77–5.28)
RDW: 13.2 % (ref 12.3–15.4)
WBC: 7.3 10*3/uL (ref 3.4–10.8)

## 2018-01-28 LAB — HEMOGLOBIN A1C
Est. average glucose Bld gHb Est-mCnc: 105 mg/dL
HEMOGLOBIN A1C: 5.3 % (ref 4.8–5.6)

## 2018-01-28 LAB — VITAMIN D 25 HYDROXY (VIT D DEFICIENCY, FRACTURES): Vit D, 25-Hydroxy: 22.7 ng/mL — ABNORMAL LOW (ref 30.0–100.0)

## 2018-01-28 MED ORDER — VITAMIN D 1000 UNITS PO TABS: 1000.0000 [IU] | ORAL_TABLET | Freq: Every day | ORAL | 1 refills | Status: DC

## 2018-01-28 MED ORDER — POTASSIUM CHLORIDE ER 10 MEQ PO TBCR: 10.0000 meq | EXTENDED_RELEASE_TABLET | Freq: Every day | ORAL | 1 refills | Status: DC

## 2018-01-29 IMAGING — CT CT HEAD W/O CM
3 of 4 series · 14 of 47 positions shown, 16 images · non-contrast
Comparison: 08/03/2014

CLINICAL DATA: Facial numbness since yesterday.  Weakness.

EXAM:
CT HEAD WITHOUT CONTRAST
TECHNIQUE: Contiguous axial images were obtained from the base of the skull
through the vertex without intravenous contrast.

[Series 2: head w/o · axial · non-contrast · 0.45mm/px · z∈[+1774,+1894]mm · 8 of 28 slices shown, 10 images]
[im 2/28  brain]
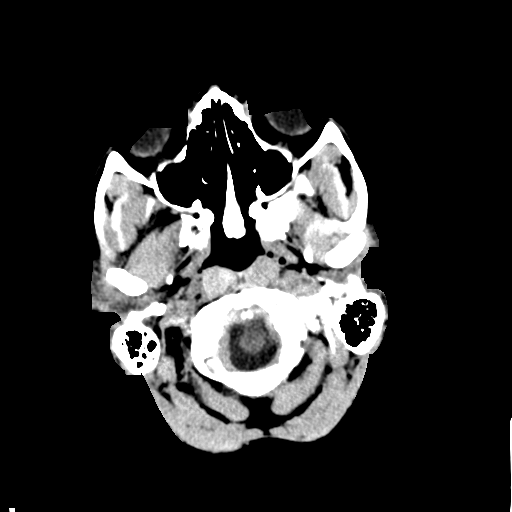
[im 2/28  bone]
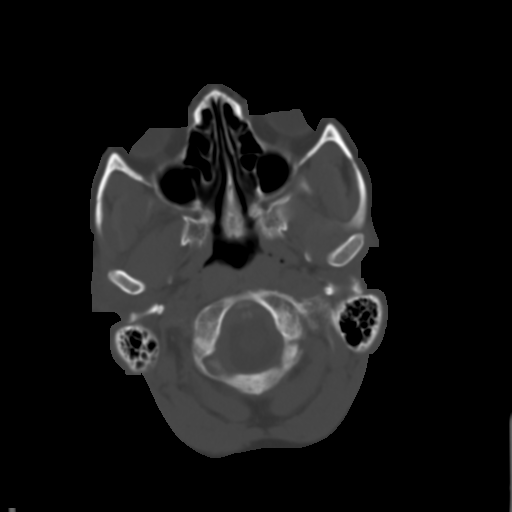
[im 6/28  brain]
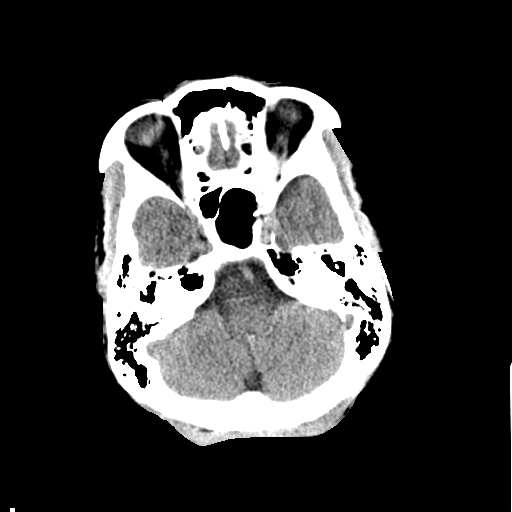
[im 10/28  brain]
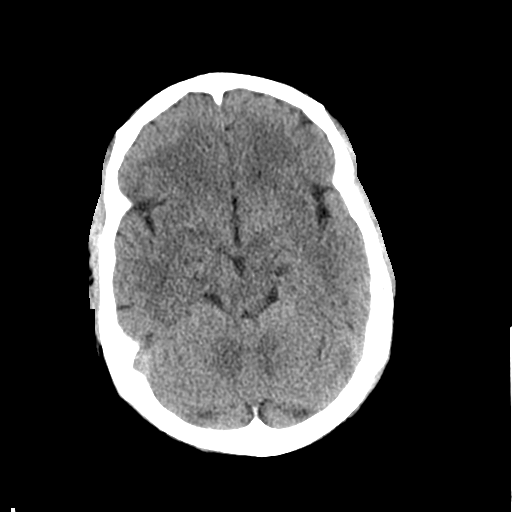
[im 12/28  brain]
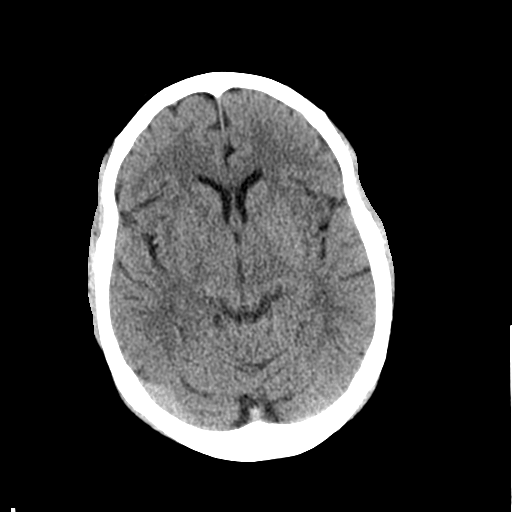
[im 16/28  brain]
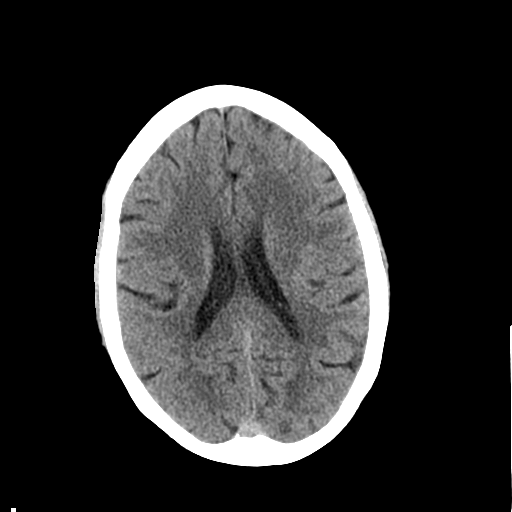
[im 16/28  bone]
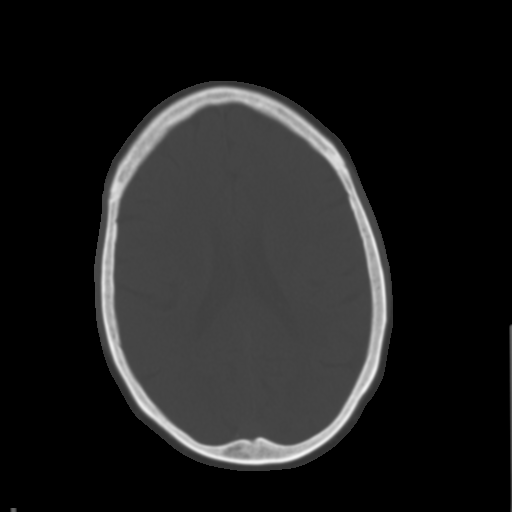
[im 18/28  brain]
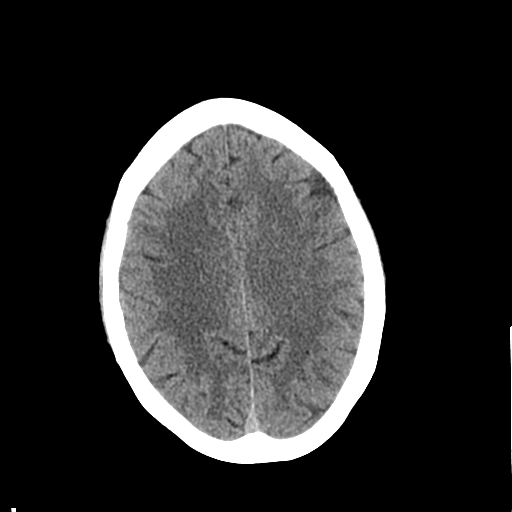
[im 22/28  brain]
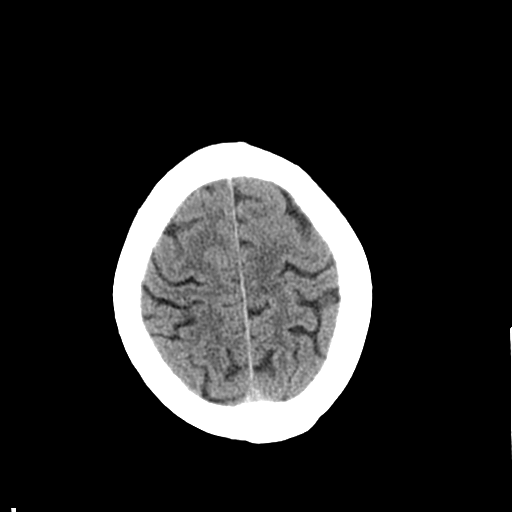
[im 26/28  brain]
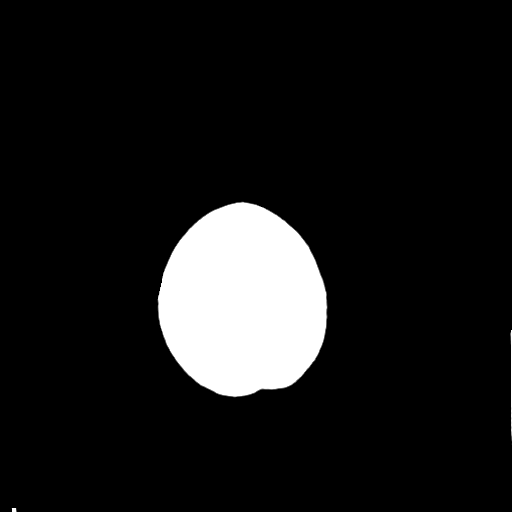

[Series 4: coronal · coronal · 0.26mm/px · 3 of 76 slices shown]
[im 26/76  brain]
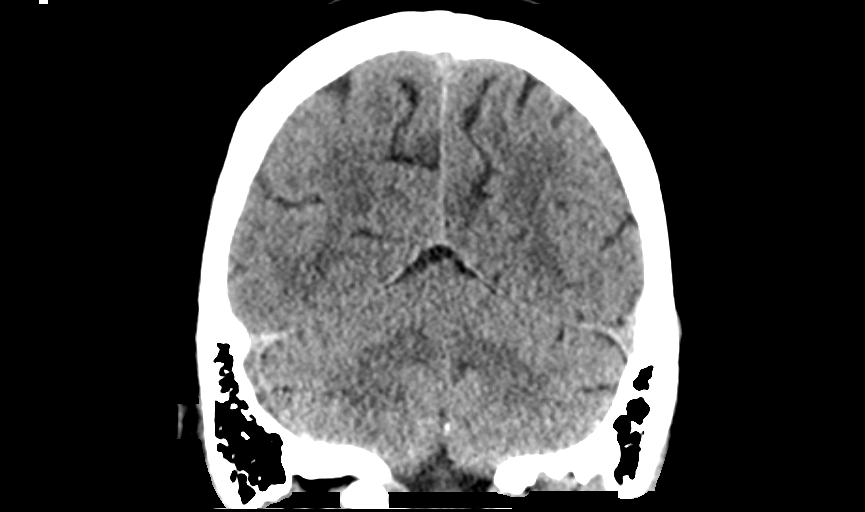
[im 34/76  brain]
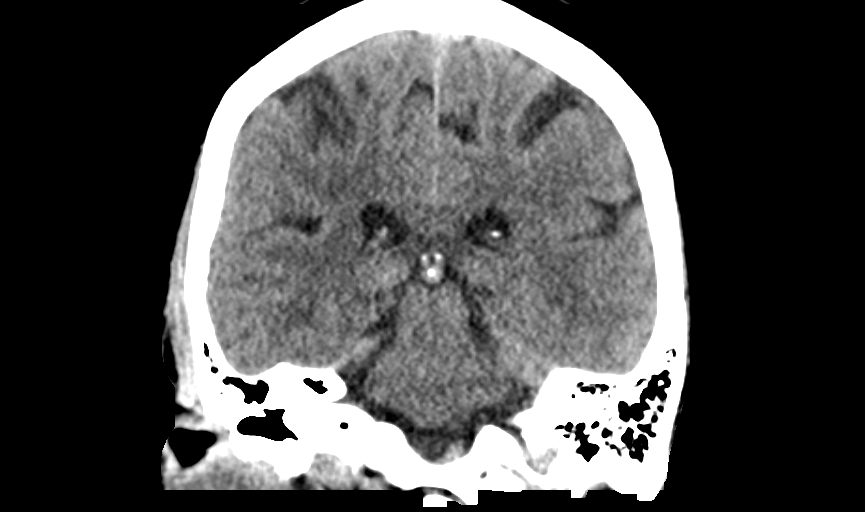
[im 42/76  brain]
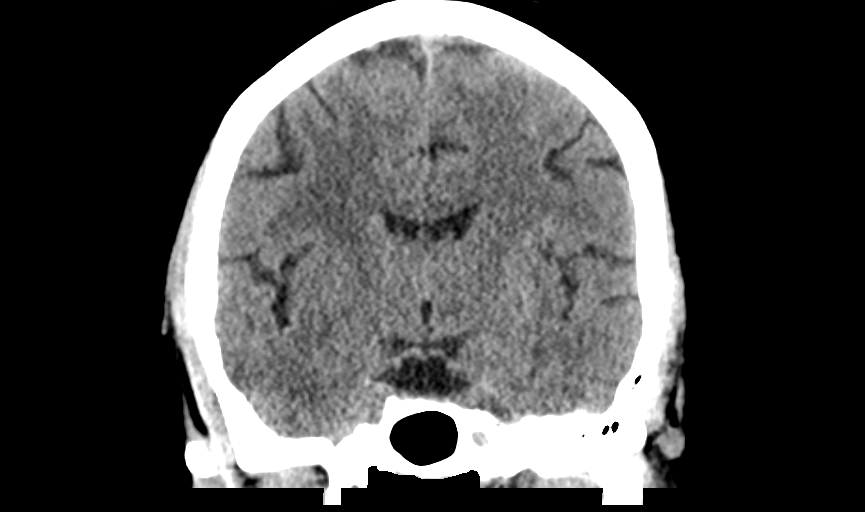

[Series 5: sagittal · sagittal · 0.26mm/px · 3 of 67 slices shown]
[im 23/67  brain]
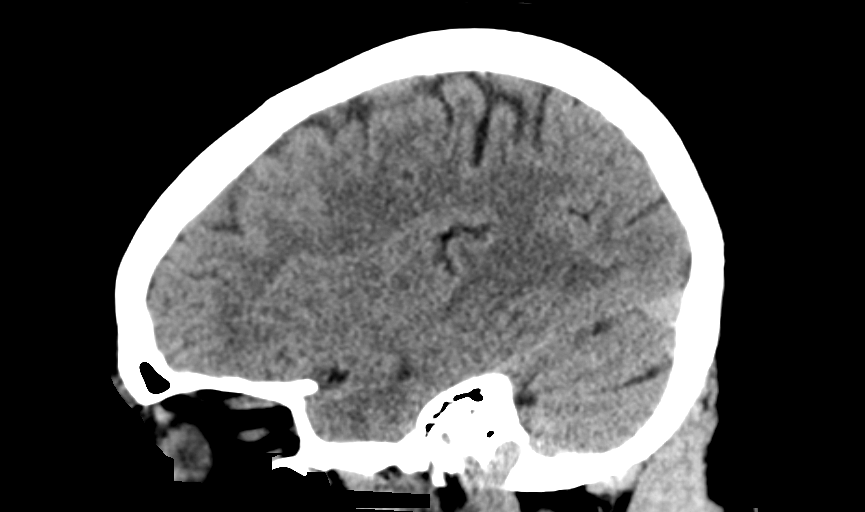
[im 34/67  brain]
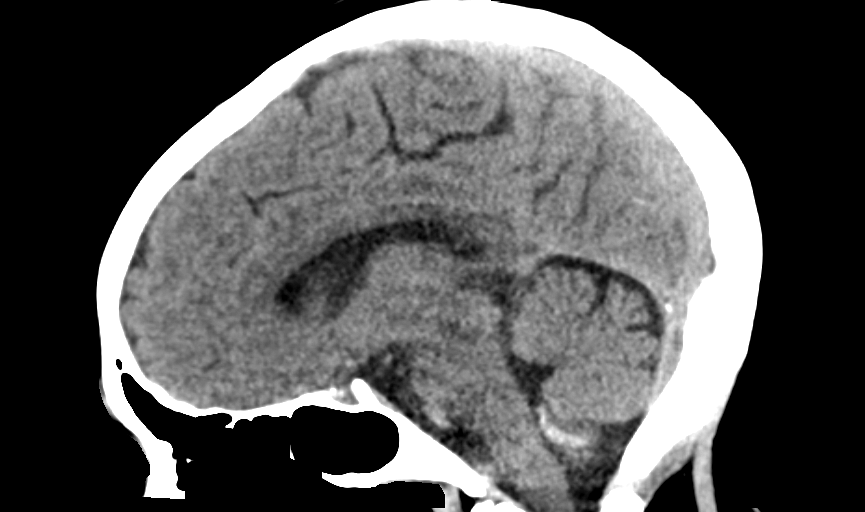
[im 45/67  brain]
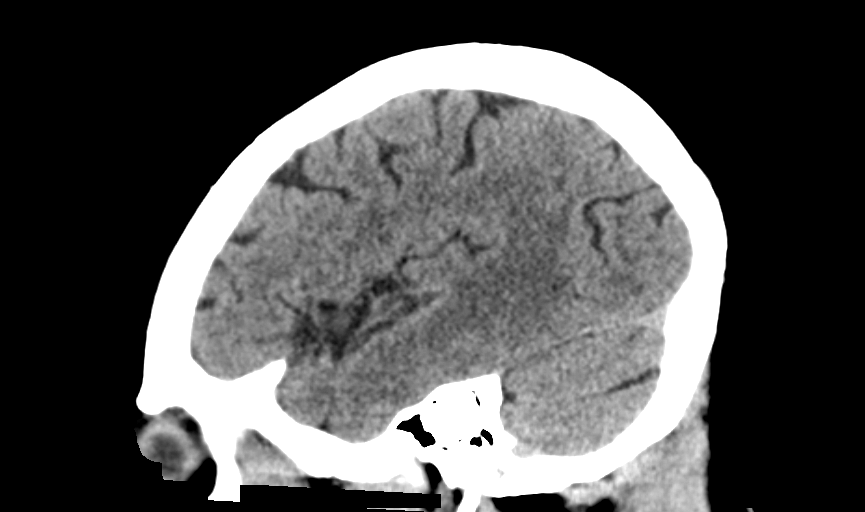

[14 of 47 positions shown; findings below may reference images not displayed]

FINDINGS: Brain: No evidence of acute infarction, hemorrhage, hydrocephalus,
extra-axial collection or mass lesion/mass effect.

Vascular: No hyperdense vessel or unexpected calcification.

Skull: Normal. Negative for fracture or focal lesion.

Sinuses/Orbits: No acute finding.
IMPRESSION: No acute intracranial abnormality.

## 2018-01-30 ENCOUNTER — Telehealth: Payer: Self-pay

## 2018-01-30 NOTE — Telephone Encounter (Signed)
-----   Message from Gildardo Pounds, NP sent at 01/28/2018  1:51 PM EST ----- Your kidney function has improved however only slightly and it is abnormal. I will place referral to nephrology at this time. Potassium is still below normal. Please continue potassium for the next 30 days and we will recheck potassium in 4-6 weeks. I will send prescription to pharmacy. Vitamin D is below normal. This can increase your risk of osteoporosis. I will send a prescription for vitamin D to your pharmacy for you to continue each month. Please make sure you do not add any salt to your foods. You need to use a salt substitute as this affects your blood pressure and your sodium level

## 2018-01-30 NOTE — Telephone Encounter (Signed)
CMA spoke to patient's daughter regarding lab results and PCP advising.   Patient daughter will relay the message to patient.

## 2018-02-03 ENCOUNTER — Encounter: Payer: Self-pay | Admitting: Internal Medicine

## 2018-03-08 ENCOUNTER — Ambulatory Visit: Payer: Medicaid Other | Admitting: Internal Medicine

## 2018-03-15 ENCOUNTER — Encounter (HOSPITAL_COMMUNITY): Payer: Self-pay | Admitting: Emergency Medicine

## 2018-03-15 ENCOUNTER — Telehealth: Payer: Self-pay

## 2018-03-15 ENCOUNTER — Emergency Department (HOSPITAL_COMMUNITY)
Admission: EM | Admit: 2018-03-15 | Discharge: 2018-03-15 | Disposition: A | Discharge status: Home / Self Care | Payer: Medicaid Other | Attending: Emergency Medicine | Admitting: Emergency Medicine

## 2018-03-15 ENCOUNTER — Other Ambulatory Visit: Payer: Self-pay

## 2018-03-15 DIAGNOSIS — Y999 Unspecified external cause status: Secondary | ICD-10-CM | POA: Diagnosis not present

## 2018-03-15 DIAGNOSIS — S39012A Strain of muscle, fascia and tendon of lower back, initial encounter: Secondary | ICD-10-CM | POA: Diagnosis not present

## 2018-03-15 DIAGNOSIS — I10 Essential (primary) hypertension: Secondary | ICD-10-CM | POA: Diagnosis not present

## 2018-03-15 DIAGNOSIS — R350 Frequency of micturition: Secondary | ICD-10-CM | POA: Insufficient documentation

## 2018-03-15 DIAGNOSIS — E119 Type 2 diabetes mellitus without complications: Secondary | ICD-10-CM | POA: Insufficient documentation

## 2018-03-15 DIAGNOSIS — F17210 Nicotine dependence, cigarettes, uncomplicated: Secondary | ICD-10-CM | POA: Insufficient documentation

## 2018-03-15 DIAGNOSIS — Y939 Activity, unspecified: Secondary | ICD-10-CM | POA: Diagnosis not present

## 2018-03-15 DIAGNOSIS — Z79899 Other long term (current) drug therapy: Secondary | ICD-10-CM | POA: Insufficient documentation

## 2018-03-15 DIAGNOSIS — Y929 Unspecified place or not applicable: Secondary | ICD-10-CM | POA: Insufficient documentation

## 2018-03-15 DIAGNOSIS — X501XXA Overexertion from prolonged static or awkward postures, initial encounter: Secondary | ICD-10-CM | POA: Insufficient documentation

## 2018-03-15 LAB — URINALYSIS, ROUTINE W REFLEX MICROSCOPIC
BILIRUBIN URINE: NEGATIVE
GLUCOSE, UA: NEGATIVE mg/dL
Ketones, ur: NEGATIVE mg/dL
NITRITE: NEGATIVE
Protein, ur: 100 mg/dL — AB
SPECIFIC GRAVITY, URINE: 1.011 (ref 1.005–1.030)
pH: 6 (ref 5.0–8.0)

## 2018-03-15 MED ORDER — OXYCODONE-ACETAMINOPHEN 5-325 MG PO TABS
1.0000 | ORAL_TABLET | Freq: Once | ORAL | Status: AC
  Administered 2018-03-15: 1 via ORAL
  Filled 2018-03-15: qty 1

## 2018-03-15 MED ORDER — NITROFURANTOIN MONOHYD MACRO 100 MG PO CAPS: 100.0000 mg | ORAL_CAPSULE | Freq: Two times a day (BID) | ORAL | 0 refills | Status: DC

## 2018-03-15 MED ORDER — DIAZEPAM 5 MG PO TABS: 5.0000 mg | ORAL_TABLET | Freq: Two times a day (BID) | ORAL | 0 refills | Status: DC

## 2018-03-15 NOTE — ED Provider Notes (Signed)
[facility] EMERGENCY DEPARTMENT Provider Note   CSN: [id number] Arrival date & time: 03/15/18  1503     History   Chief Complaint Chief Complaint  Patient presents with  . Back Pain  . Urinary Frequency    HPI Mary Stephens is a 62 y.o. female.  She is complaining of acute left-sided low back pain and spasm that occurred when she woke up this morning.  It is exacerbated by any kind of twisting motion or standing up in the jostling of the bus on the right over.  She recalls no trauma.  Is not associated with any abdominal pain and no chest pain or shortness of breath.  She has had no numbness or tingling in her arms or legs.  No fever no cough.  On arrival to triage she was given a Percocet which made her feel tired tired but helped a little bit with the pain.  The history is provided by the patient.  Back Pain   This is a new problem. The current episode started 6 to 12 hours ago. The problem occurs constantly. The problem has not changed since onset.The pain is present in the lumbar spine. The quality of the pain is described as stabbing. The pain radiates to the left thigh. The pain is severe. The symptoms are aggravated by bending, twisting and certain positions. Pertinent negatives include no chest pain, no fever, no numbness, no abdominal pain, no perianal numbness, no bladder incontinence, no dysuria, no paresthesias, no paresis, no tingling and no weakness. She has tried nothing for the symptoms. The treatment provided no relief.    Past Medical History:  Diagnosis Date  . Anxiety   . Arthritis   . Diabetes mellitus without complication ([place])   . Hepatitis C   . Hypertension     Patient Active Problem List   Diagnosis Date Noted  . Essential hypertension 05/14/2015  . Tobacco dependence 12/27/2014  . DM (diabetes mellitus), type 2 ([place]) 09/12/2014  . Chronic hepatitis C virus infection ([place]) 08/14/2014    Past Surgical History:  Procedure  Laterality Date  . PARTIAL HYSTERECTOMY  2001     OB History   None      Home Medications    Prior to Admission medications   Medication Sig Start Date End Date Taking? Authorizing Provider  acetaminophen (TYLENOL) 500 MG tablet Take 2 tablets (1,000 mg total) by mouth every 6 (six) hours as needed. 12/13/17   Charlesetta Shanks, MD  amLODipine (NORVASC) 10 MG tablet Take 1 tablet (10 mg total) by mouth every morning. 01/27/18   Gildardo Pounds, NP  atorvastatin (LIPITOR) 10 MG tablet Take 1 tablet (10 mg total) by mouth daily. 01/27/18   Gildardo Pounds, NP  buPROPion [facility] SR) 150 MG 12 hr tablet Take 1 tablet (150 mg total) 2 (two) times daily by mouth. 10/28/17   Gildardo Pounds, NP  cholecalciferol (VITAMIN D) 1000 units tablet Take 1 tablet (1,000 Units total) by mouth daily. 01/28/18   Gildardo Pounds, NP  cyclobenzaprine (FLEXERIL) 10 MG tablet Take 1 tablet (10 mg total) by mouth 2 (two) times daily as needed for muscle spasms. Patient not taking: Reported on 01/27/2018 12/13/17   Charlesetta Shanks, MD  gabapentin (NEURONTIN) 300 MG capsule Take 1 capsule (300 mg total) daily as needed by mouth (for nerve pains in feet, only PRN). 10/28/17   Gildardo Pounds, NP  glipiZIDE (GLUCOTROL XL) 5 MG 24 hr tablet Take  1 tablet (5 mg total) by mouth daily with breakfast. 01/27/18   Gildardo Pounds, NP  glucose blood test strip Check BS twice daily, 250.00 09/12/14   Lance Bosch, NP  glucose monitoring kit (FREESTYLE) monitoring kit 1 each by Does not apply route as needed for other. Please check BS twice daily before meals, 250.00 09/12/14   Lance Bosch, NP  Lancets (FREESTYLE) lancets Check BS twice daily before meals, 250.00 09/12/14   Lance Bosch, NP  metoprolol succinate (TOPROL-XL) 50 MG 24 hr tablet Take 1 tablet (50 mg total) by mouth daily. Take with or immediately following a meal. 01/27/18   Gildardo Pounds, NP  potassium chloride (K-DUR) 10 MEQ tablet Take 1 tablet (10 mEq  total) by mouth daily. 01/28/18 02/27/18  Gildardo Pounds, NP  Soft Lens Products (SENSITIVE EYES SALINE) SOLN 2 application by Does not apply route 2 (two) times daily. Patient not taking: Reported on 10/28/2017 10/20/16   Maren Reamer, MD  Vitamin D, Ergocalciferol, (DRISDOL) 50000 units CAPS capsule Take 1 capsule (50,000 Units total) every 7 (seven) days by mouth. 10/31/17   Gildardo Pounds, NP    Family History Family History  Problem Relation Age of Onset  . Diabetes Mother     Social History Social History   Tobacco Use  . Smoking status: Current Every Day Smoker    Packs/day: 0.25    Types: Cigarettes  . Smokeless tobacco: Never Used  . Tobacco comment: admits to cuttting back 4 cigarettes/day  Substance Use Topics  . Alcohol use: No  . Drug use: No     Allergies   Patient has no known allergies.   Review of Systems Review of Systems  Constitutional: Negative for chills and fever.  HENT: Negative for ear pain and sore throat.   Eyes: Negative for pain and visual disturbance.  Respiratory: Negative for cough and shortness of breath.   Cardiovascular: Negative for chest pain and palpitations.  Gastrointestinal: Negative for abdominal pain and vomiting.  Genitourinary: Negative for bladder incontinence, dysuria and hematuria.  Musculoskeletal: Positive for back pain. Negative for arthralgias.  Skin: Negative for color change and rash.  Neurological: Negative for tingling, seizures, syncope, weakness, numbness and paresthesias.  All other systems reviewed and are negative.    Physical Exam Updated Vital Signs BP (!) 155/109 (BP Location: Right Arm)   Pulse 81   Temp 98.8 F (37.1 C) (Oral)   Resp 16   Ht 5' 8"  (1.727 m)   Wt 72.6 kg (160 lb)   SpO2 94%   BMI 24.33 kg/m   Physical Exam  Constitutional: She appears well-developed and well-nourished. No distress.  HENT:  Head: Normocephalic and atraumatic.  Eyes: Conjunctivae are normal.  Neck:  Neck supple.  Cardiovascular: Normal rate and regular rhythm.  No murmur heard. Pulmonary/Chest: Effort normal and breath sounds normal. No respiratory distress.  Abdominal: Soft. There is no tenderness.  Musculoskeletal: She exhibits no edema.  No midline tenderness, she has left and spasm.  Neurological: She is alert. She has normal strength. No cranial nerve deficit or sensory deficit. Gait normal. GCS eye subscore is 4. GCS verbal subscore is 5. GCS motor subscore is 6.  Skin: Skin is warm and dry.  Psychiatric: She has a normal mood and affect.  Nursing note and vitals reviewed.    ED Treatments / Results  Labs (all labs ordered are listed, but only abnormal results are displayed) Labs Reviewed  URINALYSIS,  ROUTINE W REFLEX MICROSCOPIC - Abnormal; Notable for the following components:      Result Value   APPearance HAZY (*)    Hgb urine dipstick SMALL (*)    Protein, ur 100 (*)    Leukocytes, UA TRACE (*)    Bacteria, UA RARE (*)    Squamous Epithelial / LPF 6-30 (*)    All other components within normal limits    EKG None  Radiology No results found.  Procedures Procedures (including critical care time)  Medications Ordered in ED Medications  oxyCODONE-acetaminophen (PERCOCET/ROXICET) 5-325 MG per tablet 1 tablet (1 tablet Oral Given 03/15/18 1522)     Initial Impression / Assessment and Plan / ED Course  I have reviewed the triage vital signs and the nursing notes.  Pertinent labs & imaging results that were available during my care of the patient were reviewed by me and considered in my medical decision making (see chart for details).  Clinical Course as of Mar 17 1121  Wed Mar 15, 2018  1844 Patient with likely musculoskeletal low back pain.  She is otherwise well-appearing.  No neurologic findings.  Abdomen is soft and benign.  She had some relief with the Percocet.  She states she had received some medication a few years back for shoulder spasm.  I reviewed  the prior medical record and she received Naprosyn and Valium.   [MB]  7897 Patient now mentions that she is also had some urinary frequency of the chest preceded this back pain.  Urinalysis here was unremarkable for infection.  She states there is been no vaginal discharge or bleeding.  I think is reasonable to also give her a prescription for some Macrobid to cover in case there is some low-grade infection going on.  She has no known allergies.   [MB]    Clinical Course User Index [MB] Hayden Rasmussen, MD      Final Clinical Impressions(s) / ED Diagnoses   Final diagnoses:  Strain of lumbar region, initial encounter  Urinary frequency    ED Discharge Orders        Ordered    diazepam (VALIUM) 5 MG tablet  2 times daily     03/15/18 1848    nitrofurantoin, macrocrystal-monohydrate, (MACROBID) 100 MG capsule  2 times daily     03/15/18 1848       Hayden Rasmussen, MD 03/17/18 1122

## 2018-03-15 NOTE — Telephone Encounter (Signed)
Patient came in the office today requesting for PCP to give an advice for back pain or medication.  CMA spoke to patient and told patient PCP is with a patient and I can send her a message to inform her that she was having a bad back spasm.  CMA told patient if it is urgent, she would need to go to Emergency Room.   Patient understood.

## 2018-03-15 NOTE — ED Provider Notes (Signed)
Patient placed in Quick Look pathway, seen and evaluated   Chief Complaint: back pain  HPI:  Pt w PMHx HTN, chronic hep C, DM, presenting to the ED with acute onset of back pain that began this morning. She states pain feels like spasms with shooting pain with certain movements. Pain is on left side towards the middle of her back. No recent strenuous activity or injuries. No medications tried for pain. No hx back pain. No N/T in extremities, no bowel/bladder incontinence, abd pain.  ROS: + back pain, - urinary sx  Physical Exam:   Gen: well-appearing  Neuro: Awake and Alert  Skin: Warm    Focused Exam: no midline spinal tenderness, no bony step-offs or gross deformities. Mild TTP left lower back musculature. 5/5 strength BLE, normal sensation, normal gait. Abd soft and nontender.    Initiation of care has begun. The patient has been counseled on the process, plan, and necessity for staying for the completion/evaluation, and the remainder of the medical screening examination    Ramaj Frangos, [place] N, PA-C 03/15/18 [place], [place] C[place], MD 03/15/18 [phone]

## 2018-03-15 NOTE — Telephone Encounter (Signed)
Agree/Noted. 

## 2018-03-15 NOTE — ED Triage Notes (Signed)
Pt c/o onset of lower back pain and spasms. States she went to see her primary care doctor but was unable to see them today.

## 2018-03-15 NOTE — ED Triage Notes (Signed)
Pt also c/o urinary frequency.

## 2018-03-15 NOTE — Discharge Instructions (Addendum)
Your evaluated in the emergency department for low back pain and urinary frequency.  Your urinalysis did not show any obvious signs of infection but we will cover you with an antibiotic.  Your pain in the back seems likely muscle spasm and we are prescribing some spasm medicine.  You should continue some ibuprofen or other anti-inflammatory to also help with the pain.  The spasm medicine will make you tired so please use with caution.  Make an appointment to follow-up with your doctor and return if any worsening symptoms.

## 2018-04-14 ENCOUNTER — Ambulatory Visit: Payer: Medicaid Other | Admitting: Internal Medicine

## 2018-06-20 ENCOUNTER — Encounter: Payer: Self-pay | Admitting: Nurse Practitioner

## 2018-06-20 ENCOUNTER — Other Ambulatory Visit: Payer: Self-pay | Admitting: Gerontology

## 2018-06-20 DIAGNOSIS — Z1231 Encounter for screening mammogram for malignant neoplasm of breast: Secondary | ICD-10-CM

## 2018-06-20 DIAGNOSIS — M8580 Other specified disorders of bone density and structure, unspecified site: Secondary | ICD-10-CM

## 2018-06-22 ENCOUNTER — Encounter: Payer: Self-pay | Admitting: Internal Medicine

## 2018-07-07 ENCOUNTER — Ambulatory Visit (INDEPENDENT_AMBULATORY_CARE_PROVIDER_SITE_OTHER): Payer: Medicare (Managed Care) | Admitting: Internal Medicine

## 2018-07-07 ENCOUNTER — Encounter (INDEPENDENT_AMBULATORY_CARE_PROVIDER_SITE_OTHER): Payer: Self-pay

## 2018-07-07 ENCOUNTER — Encounter: Payer: Self-pay | Admitting: Internal Medicine

## 2018-07-07 VITALS — BP 124/70 | HR 74 | Ht 68.0 in | Wt 161.0 lb

## 2018-07-07 DIAGNOSIS — K625 Hemorrhage of anus and rectum: Secondary | ICD-10-CM | POA: Diagnosis not present

## 2018-07-07 DIAGNOSIS — Z86010 Personal history of colonic polyps: Secondary | ICD-10-CM | POA: Diagnosis not present

## 2018-07-07 NOTE — Patient Instructions (Signed)
Please call back to schedule your colonoscopy

## 2018-07-07 NOTE — Progress Notes (Signed)
HISTORY OF PRESENT ILLNESS:  Mary Stephens is a 62 y.o. female with past medical history as listed below who is sent by her primary care provider Nurse. Fleming regarding rectal bleeding and the need for colonoscopy. The patient tells me that she has had 2 prior colonoscopies with polyps on one occasion. First colonoscopy she believes around 2001 most recent around 2011. No details. She reports a 6 month history of intermittent rectal bleeding. Typically on the tissue. No associated rectal pain. Her bowel habits mildly alternate. She does have occasional bloating and indigestion with dietary indiscretion. No dysphagia. She was evaluated in the emergency room earlier that she (January). Reviewed. Outside blood work from 01/27/2018 shows a hemoglobin of 13.9. She continues to smoke. More recent blood work from 05/23/2018 shows hemoglobin 13.8. Creatinine 1.55. She is on oral agents for diabetes. Abdominal ultrasound in 2015 showed hepatic steatosis and gallbladder polyps  REVIEW OF SYSTEMS:  All non-GI ROS negative unless otherwise stated in the history of present illness except for headaches, visual change, depression, fatigue, hearing problems, muscle grams, night sweats, sleeping problems, excessive thirst, excessive urination, urinary frequency  Past Medical History:  Diagnosis Date  . Anxiety   . Arthritis   . Colon polyps   . Depression   . Diabetes mellitus without complication ([place])   . Hepatitis C   . Hypertension     Past Surgical History:  Procedure Laterality Date  . COLONOSCOPY    . PARTIAL HYSTERECTOMY  2001    Social History ODEAN MCELWAIN  reports that she has been smoking cigarettes.  She has been smoking about 0.25 packs per day. She has never used smokeless tobacco. She reports that she does not drink alcohol or use drugs.  family history includes Diabetes in her mother.  No Known Allergies     PHYSICAL EXAMINATION: Vital signs: BP 124/70   Pulse 74   Ht 5\' 8"   (1.727 m)   Wt 161 lb (73 kg)   BMI 24.48 kg/m   Constitutional: generally well-appearing, no acute distress Psychiatric: alert and oriented x3, cooperative Eyes: extraocular movements intact, anicteric, conjunctiva pink Mouth: oral pharynx moist, no lesions Neck: supple no lymphadenopathy Cardiovascular: heart regular rate and rhythm, no murmur Lungs: clear to auscultation bilaterally Abdomen: soft, nontender, nondistended, no obvious ascites, no peritoneal signs, normal bowel sounds, no organomegaly Rectal:deferred until colonoscopy Extremities: no clubbing, cyanosis, or lower extremity edema bilaterally Skin: no lesions on visible extremities Neuro: No focal deficits. Cranial nerves intact  ASSESSMENT:  #1. Intermittent minor rectal bleeding.Most likely benign anorectal pathology. Rule out neoplasia #2. History of colon polyps. No details. Sounds like last examination at least 8 years ago #3. Gen. Medical problems as listed including diabetes   PLAN:  #1. Colonoscopy.The nature of the procedure, as well as the risks, benefits, and alternatives were carefully and thoroughly reviewed with the patient. Ample time for discussion and questions allowed. The patient understood, was satisfied, and agreed to proceed. #2. Hold diabetic medications the day of the procedure to avoid and wanted hypoglycemia #3. Ongoing general medical care with PCP   A copy of this consultation note has been sent to Ms. Raul Del

## 2018-09-25 ENCOUNTER — Ambulatory Visit: Payer: Medicaid Other

## 2018-09-25 ENCOUNTER — Other Ambulatory Visit: Payer: Medicaid Other

## 2018-11-06 ENCOUNTER — Ambulatory Visit
Admission: RE | Admit: 2018-11-06 | Discharge: 2018-11-06 | Disposition: A | Discharge status: Home / Self Care | Payer: Medicare (Managed Care) | Source: Ambulatory Visit | Attending: Gerontology | Admitting: Gerontology

## 2018-11-06 DIAGNOSIS — Z1231 Encounter for screening mammogram for malignant neoplasm of breast: Secondary | ICD-10-CM

## 2018-11-06 DIAGNOSIS — M8580 Other specified disorders of bone density and structure, unspecified site: Secondary | ICD-10-CM

## 2018-12-11 ENCOUNTER — Other Ambulatory Visit: Payer: Self-pay

## 2018-12-11 DIAGNOSIS — I739 Peripheral vascular disease, unspecified: Secondary | ICD-10-CM

## 2019-01-11 ENCOUNTER — Encounter (HOSPITAL_COMMUNITY): Payer: Medicare (Managed Care)

## 2019-01-11 ENCOUNTER — Encounter: Payer: Medicare (Managed Care) | Admitting: Vascular Surgery

## 2019-02-15 ENCOUNTER — Encounter: Payer: Self-pay | Admitting: Family

## 2019-02-15 ENCOUNTER — Encounter (HOSPITAL_COMMUNITY): Payer: Medicare (Managed Care)

## 2019-02-15 ENCOUNTER — Encounter: Payer: Medicare (Managed Care) | Admitting: Vascular Surgery

## 2019-10-15 ENCOUNTER — Other Ambulatory Visit: Payer: Self-pay | Admitting: Gerontology

## 2019-10-15 DIAGNOSIS — Z1231 Encounter for screening mammogram for malignant neoplasm of breast: Secondary | ICD-10-CM

## 2019-11-15 ENCOUNTER — Ambulatory Visit (INDEPENDENT_AMBULATORY_CARE_PROVIDER_SITE_OTHER): Payer: Medicare (Managed Care) | Admitting: Podiatry

## 2019-11-15 ENCOUNTER — Other Ambulatory Visit: Payer: Self-pay

## 2019-11-15 ENCOUNTER — Ambulatory Visit (INDEPENDENT_AMBULATORY_CARE_PROVIDER_SITE_OTHER): Payer: Medicare (Managed Care)

## 2019-11-15 DIAGNOSIS — E1149 Type 2 diabetes mellitus with other diabetic neurological complication: Secondary | ICD-10-CM

## 2019-11-15 DIAGNOSIS — M779 Enthesopathy, unspecified: Secondary | ICD-10-CM

## 2019-11-15 DIAGNOSIS — M79672 Pain in left foot: Secondary | ICD-10-CM | POA: Diagnosis not present

## 2019-11-15 DIAGNOSIS — M79671 Pain in right foot: Secondary | ICD-10-CM

## 2019-11-15 NOTE — Patient Instructions (Signed)

## 2019-11-16 ENCOUNTER — Other Ambulatory Visit: Payer: Self-pay | Admitting: Podiatry

## 2019-11-16 DIAGNOSIS — M779 Enthesopathy, unspecified: Secondary | ICD-10-CM

## 2019-11-20 NOTE — Progress Notes (Signed)
Subjective:   Patient ID: Mary Stephens, female   DOB: 63 y.o.   MRN: [id number]   HPI 63 year old female presents the office today for concerns of bilateral foot pain.  She describing burning, tingling sensations to her feet.  Is been ongoing for about 2 years.  She states that she is on gabapentin which is helpful but she thinks the dose needs to be increased.  She denies any claudication symptoms.  She has pain mostly at nighttime.  She also states that she feels that there is a "knot" or something bunched up underneath her feet the left side worse than right.  She denies any recent injury no significant swelling.  No other concerns.  Review of Systems  All other systems reviewed and are negative.  Past Medical History:  Diagnosis Date  . Anxiety   . Arthritis   . Colon polyps   . Depression   . Diabetes mellitus without complication ([place])   . Hepatitis C   . Hypertension     Past Surgical History:  Procedure Laterality Date  . COLONOSCOPY    . PARTIAL HYSTERECTOMY  2001     Current Outpatient Medications:  .  acetaminophen (TYLENOL) 500 MG tablet, Take 2 tablets (1,000 mg total) by mouth every 6 (six) hours as needed., Disp: 30 tablet, Rfl: 0 .  amLODipine (NORVASC) 10 MG tablet, Take 1 tablet (10 mg total) by mouth every morning., Disp: 90 tablet, Rfl: 3 .  atorvastatin (LIPITOR) 10 MG tablet, Take 1 tablet (10 mg total) by mouth daily., Disp: 90 tablet, Rfl: 3 .  cholecalciferol (VITAMIN D) 1000 units tablet, Take 1 tablet (1,000 Units total) by mouth daily., Disp: 90 tablet, Rfl: 1 .  gabapentin (NEURONTIN) 300 MG capsule, Take 1 capsule (300 mg total) daily as needed by mouth (for nerve pains in feet, only PRN)., Disp: 180 capsule, Rfl: 3 .  glipiZIDE (GLUCOTROL XL) 5 MG 24 hr tablet, Take 1 tablet (5 mg total) by mouth daily with breakfast., Disp: 90 tablet, Rfl: 3 .  glucose blood test strip, Check BS twice daily, 250.00, Disp: 100 each, Rfl: 12 .  glucose monitoring kit  (FREESTYLE) monitoring kit, 1 each by Does not apply route as needed for other. Please check BS twice daily before meals, 250.00, Disp: 1 each, Rfl: 0 .  Lancets (FREESTYLE) lancets, Check BS twice daily before meals, 250.00, Disp: 100 each, Rfl: 12 .  potassium chloride (K-DUR) 10 MEQ tablet, Take 1 tablet (10 mEq total) by mouth daily., Disp: 30 tablet, Rfl: 1  No Known Allergies       Objective:  Physical Exam  General: AAO x3, NAD  Dermatological: Skin is warm, dry and supple bilateral. Nails x 10 are well manicured; remaining integument appears unremarkable at this time. There are no open sores, no preulcerative lesions, no rash or signs of infection present.  Vascular: Dorsalis Pedis artery and Posterior Tibial artery pedal pulses are 2/4 bilateral with immedate capillary fill time.  She has no claudication symptoms.  There is no pain with calf compression, swelling, warmth, erythema.   Neruologic: Sensation decreased with Semmes Weinstein monofilament decreased vibratory sensation.  Musculoskeletal: There is no area of tenderness identified to bilateral lower extremities.  There is no edema, erythema.  Unable to palpate any soft tissue masses.  Muscular strength 5/5 in all groups tested bilateral.  Gait: Unassisted, Nonantalgic.      Assessment:   63 year old female with neuropathy    Plan:  -Treatment  options discussed including all alternatives, risks, and complications -Etiology of symptoms were discussed -X-rays obtained and reviewed.  No evidence of acute fracture. -We discussed alternative treatments to the medication because as what she originally wanted.  We discussed neurogenix however she wants to hold off.  Discussed with her possibly increasing gabapentin but given her kidney function I want her to follow-up with her primary care physician for this.  I did try to do an ABI in the office to ensure healing however the left side was not working correctly.  The right  side was normal at 1.14.  He has no claudication symptoms.  Will monitor.  Return if symptoms worsen or fail to improve.  Trula Slade DPM

## 2019-12-04 ENCOUNTER — Ambulatory Visit
Admission: RE | Admit: 2019-12-04 | Discharge: 2019-12-04 | Disposition: A | Discharge status: Home / Self Care | Payer: Medicare (Managed Care) | Source: Ambulatory Visit | Attending: Gerontology | Admitting: Gerontology

## 2019-12-04 ENCOUNTER — Other Ambulatory Visit: Payer: Self-pay

## 2019-12-04 DIAGNOSIS — Z1231 Encounter for screening mammogram for malignant neoplasm of breast: Secondary | ICD-10-CM

## 2019-12-05 ENCOUNTER — Other Ambulatory Visit: Payer: Self-pay | Admitting: Gerontology

## 2019-12-05 DIAGNOSIS — R928 Other abnormal and inconclusive findings on diagnostic imaging of breast: Secondary | ICD-10-CM

## 2019-12-14 DIAGNOSIS — Z8679 Personal history of other diseases of the circulatory system: Secondary | ICD-10-CM

## 2019-12-14 HISTORY — DX: Personal history of other diseases of the circulatory system: Z86.79

## 2020-04-22 ENCOUNTER — Ambulatory Visit
Admission: RE | Admit: 2020-04-22 | Discharge: 2020-04-22 | Disposition: A | Discharge status: Home / Self Care | Payer: Medicare (Managed Care) | Source: Ambulatory Visit | Attending: Gerontology | Admitting: Gerontology

## 2020-04-22 ENCOUNTER — Other Ambulatory Visit: Payer: Self-pay

## 2020-04-22 DIAGNOSIS — R928 Other abnormal and inconclusive findings on diagnostic imaging of breast: Secondary | ICD-10-CM

## 2020-05-02 ENCOUNTER — Other Ambulatory Visit: Payer: Self-pay | Admitting: Nephrology

## 2020-05-02 DIAGNOSIS — N184 Chronic kidney disease, stage 4 (severe): Secondary | ICD-10-CM

## 2020-05-19 DIAGNOSIS — H9313 Tinnitus, bilateral: Secondary | ICD-10-CM | POA: Insufficient documentation

## 2020-05-19 DIAGNOSIS — H903 Sensorineural hearing loss, bilateral: Secondary | ICD-10-CM | POA: Insufficient documentation

## 2020-06-19 DIAGNOSIS — Y9389 Activity, other specified: Secondary | ICD-10-CM | POA: Insufficient documentation

## 2020-06-19 DIAGNOSIS — F17210 Nicotine dependence, cigarettes, uncomplicated: Secondary | ICD-10-CM | POA: Insufficient documentation

## 2020-06-19 DIAGNOSIS — E119 Type 2 diabetes mellitus without complications: Secondary | ICD-10-CM | POA: Insufficient documentation

## 2020-06-19 DIAGNOSIS — Z79899 Other long term (current) drug therapy: Secondary | ICD-10-CM | POA: Insufficient documentation

## 2020-06-19 DIAGNOSIS — Y998 Other external cause status: Secondary | ICD-10-CM | POA: Insufficient documentation

## 2020-06-19 DIAGNOSIS — Y9289 Other specified places as the place of occurrence of the external cause: Secondary | ICD-10-CM | POA: Insufficient documentation

## 2020-06-19 DIAGNOSIS — W57XXXA Bitten or stung by nonvenomous insect and other nonvenomous arthropods, initial encounter: Secondary | ICD-10-CM | POA: Insufficient documentation

## 2020-06-19 DIAGNOSIS — S60561A Insect bite (nonvenomous) of right hand, initial encounter: Secondary | ICD-10-CM | POA: Diagnosis not present

## 2020-06-19 DIAGNOSIS — I10 Essential (primary) hypertension: Secondary | ICD-10-CM | POA: Insufficient documentation

## 2020-06-20 ENCOUNTER — Other Ambulatory Visit: Payer: Self-pay

## 2020-06-20 ENCOUNTER — Emergency Department (HOSPITAL_COMMUNITY)
Admission: EM | Admit: 2020-06-20 | Discharge: 2020-06-20 | Disposition: A | Discharge status: Home / Self Care | Payer: Medicare (Managed Care) | Attending: Emergency Medicine | Admitting: Emergency Medicine

## 2020-06-20 ENCOUNTER — Encounter (HOSPITAL_COMMUNITY): Payer: Self-pay | Admitting: Emergency Medicine

## 2020-06-20 DIAGNOSIS — T63484A Toxic effect of venom of other arthropod, undetermined, initial encounter: Secondary | ICD-10-CM

## 2020-06-20 MED ORDER — PREDNISONE 20 MG PO TABS: ORAL_TABLET | ORAL | 0 refills | Status: DC

## 2020-06-20 MED ORDER — PREDNISONE 20 MG PO TABS
60.0000 mg | ORAL_TABLET | Freq: Once | ORAL | Status: AC
Start: 2020-06-20 — End: 2020-06-20
  Administered 2020-06-20: 60 mg via ORAL
  Filled 2020-06-20: qty 3

## 2020-06-20 MED ORDER — OXYCODONE-ACETAMINOPHEN 5-325 MG PO TABS
1.0000 | ORAL_TABLET | Freq: Once | ORAL | Status: AC
  Administered 2020-06-20: 1 via ORAL
  Filled 2020-06-20: qty 1

## 2020-06-20 NOTE — ED Notes (Signed)
Gauze and ace bandage wrap applied to hand and thumb.

## 2020-06-20 NOTE — ED Notes (Signed)
Pt reports she lives independent in an apartment. Pt states her neighbor dropped her off. Report was given by the night shift nurse and she stated that patient was from a facility but is unable to tell me what faculty and how she arrived. Both myself and the night shift RN spoke with patient and states " I don't live in a facility, I live in an apartment and I waited in the waiting room since midnight to be seen. I arrived by car. According to arrival patient did arrive by car and nowhere in the car does it states she lives in a facility. Pt was discharge home with AVS with no questions. Pt is a&o x4 . VSS.Pt ambulatory .

## 2020-06-20 NOTE — ED Triage Notes (Signed)
Patient reports stunged by multiple bees at forehead and right thumb this evening , airway intact / no oral swelling , respirations unlabored , denies fever or chills .

## 2020-06-25 NOTE — ED Provider Notes (Signed)
[facility] EMERGENCY DEPARTMENT Provider Note   CSN: [id number] Arrival date & time: 06/19/20  2342     History No chief complaint on file.   Mary Stephens is a 64 y.o. female.  Multiple insect stings to forehead and right thumb PTA, still with burning and edema.    Animal Bite Contact animal:  Insect Location:  Hand and finger Hand injury location:  R fingers Finger injury location:  R thumb Time since incident:  3 hours Pain details:    Quality:  Aching, burning, sore and tingling   Severity:  Mild   Timing:  Constant Incident location:  Outside Provoked: provoked   Relieved by:  Cold compresses Worsened by:  Nothing Ineffective treatments:  None tried      Past Medical History:  Diagnosis Date  . Anxiety   . Arthritis   . Colon polyps   . Depression   . Diabetes mellitus without complication ([place])   . Hepatitis C   . Hypertension     Patient Active Problem List   Diagnosis Date Noted  . Essential hypertension 05/14/2015  . Tobacco dependence 12/27/2014  . DM (diabetes mellitus), type 2 ([place]) 09/12/2014  . Chronic hepatitis C virus infection ([place]) 08/14/2014    Past Surgical History:  Procedure Laterality Date  . COLONOSCOPY    . PARTIAL HYSTERECTOMY  2001     OB History   No obstetric history on file.     Family History  Problem Relation Age of Onset  . Diabetes Mother     Social History   Tobacco Use  . Smoking status: Current Every Day Smoker    Packs/day: 0.25    Types: Cigarettes  . Smokeless tobacco: Never Used  . Tobacco comment: admits to cuttting back 4 cigarettes/day  Vaping Use  . Vaping Use: Never used  Substance Use Topics  . Alcohol use: Yes  . Drug use: Yes    Types: Marijuana    Home Medications Prior to Admission medications   Medication Sig Start Date End Date Taking? Authorizing Provider  amLODipine (NORVASC) 10 MG tablet Take 1 tablet (10 mg total) by mouth every morning. Patient taking  differently: Take 10 mg by mouth daily.  01/27/18  Yes Gildardo Pounds, NP  atorvastatin (LIPITOR) 10 MG tablet Take 1 tablet (10 mg total) by mouth daily. 01/27/18  Yes Gildardo Pounds, NP  potassium chloride (K-DUR) 10 MEQ tablet Take 1 tablet (10 mEq total) by mouth daily. 01/28/18 06/20/29 Yes Gildardo Pounds, NP  glucose blood test strip Check BS twice daily, 250.00 09/12/14   Lance Bosch, NP  glucose monitoring kit (FREESTYLE) monitoring kit 1 each by Does not apply route as needed for other. Please check BS twice daily before meals, 250.00 09/12/14   Lance Bosch, NP  Lancets (FREESTYLE) lancets Check BS twice daily before meals, 250.00 09/12/14   Lance Bosch, NP  predniSONE (DELTASONE) 20 MG tablet 3 tabs po day one, then 2 po daily x 4 days 06/20/20   Krishawn Vanderweele, Corene Cornea, MD  gabapentin (NEURONTIN) 300 MG capsule Take 1 capsule (300 mg total) daily as needed by mouth (for nerve pains in feet, only PRN). Patient not taking: Reported on 06/20/2020 10/28/17 06/20/20  Gildardo Pounds, NP  glipiZIDE (GLUCOTROL XL) 5 MG 24 hr tablet Take 1 tablet (5 mg total) by mouth daily with breakfast. Patient not taking: Reported on 06/20/2020 01/27/18 06/20/20  Gildardo Pounds, NP  Allergies    Patient has no known allergies.  Review of Systems   Review of Systems  All other systems reviewed and are negative.   Physical Exam Updated Vital Signs BP (!) 169/99 (BP Location: Left Arm)   Pulse 70   Temp 98 F (36.7 C) (Oral)   Resp 19   Ht _0  (1.727 m)   Wt 70 kg   SpO2 99%   BMI 23.46 kg/m   Physical Exam Vitals and nursing note reviewed.  Constitutional:      Appearance: She is well-developed.  HENT:     Head: Normocephalic and atraumatic.     Mouth/Throat:     Mouth: Mucous membranes are dry.     Pharynx: Oropharynx is clear.  Eyes:     Pupils: Pupils are equal, round, and reactive to light.  Cardiovascular:     Rate and Rhythm: Normal rate and regular rhythm.  Pulmonary:      Effort: Pulmonary effort is normal. No respiratory distress.     Breath sounds: No stridor.  Abdominal:     General: Abdomen is flat. There is no distension.  Musculoskeletal:        General: Swelling (right thumb with mild erythema) present. Normal range of motion.     Cervical back: Normal range of motion.  Skin:    General: Skin is warm and dry.  Neurological:     General: No focal deficit present.     Mental Status: She is alert.     ED Results / Procedures / Treatments   Labs (all labs ordered are listed, but only abnormal results are displayed) Labs Reviewed - No data to display  EKG None  Radiology No results found.  Procedures Procedures (including critical care time)  Medications Ordered in ED Medications  predniSONE (DELTASONE) tablet 60 mg (60 mg Oral Given 06/20/20 0654)  oxyCODONE-acetaminophen (PERCOCET/ROXICET) 5-325 MG per tablet 1 tablet (1 tablet Oral Given 06/20/20 6314)    ED Course  I have reviewed the triage vital signs and the nursing notes.  Pertinent labs & imaging results that were available during my care of the patient were reviewed by me and considered in my medical decision making (see chart for details).    MDM Rules/Calculators/A&P                          Offered symptomatic care but patient insisted on some other treatment so will try course of steroids as it does seem to be slight hypersensitive reaction. No e/o compartment syndrome. Too soon for cellulitis. No e/o anaphylaxis.   Final Clinical Impression(s) / ED Diagnoses Final diagnoses:  Insect stings, undetermined intent, initial encounter    Rx / DC Orders ED Discharge Orders         Ordered    predniSONE (DELTASONE) 20 MG tablet     Discontinue  Reprint     06/20/20 0641           Shunta Mclaurin, Corene Cornea, MD 06/25/20 0246

## 2020-10-21 ENCOUNTER — Ambulatory Visit
Admission: RE | Admit: 2020-10-21 | Discharge: 2020-10-21 | Disposition: A | Discharge status: Home / Self Care | Payer: Medicare (Managed Care) | Source: Ambulatory Visit | Attending: Gerontology | Admitting: Gerontology

## 2020-10-21 ENCOUNTER — Other Ambulatory Visit: Payer: Self-pay | Admitting: Gerontology

## 2020-10-21 DIAGNOSIS — R52 Pain, unspecified: Secondary | ICD-10-CM

## 2020-10-21 DIAGNOSIS — Z87828 Personal history of other (healed) physical injury and trauma: Secondary | ICD-10-CM

## 2020-10-22 ENCOUNTER — Other Ambulatory Visit: Payer: Self-pay | Admitting: Gerontology

## 2020-10-22 DIAGNOSIS — M8580 Other specified disorders of bone density and structure, unspecified site: Secondary | ICD-10-CM

## 2020-10-28 ENCOUNTER — Encounter: Payer: Self-pay | Admitting: Internal Medicine

## 2020-11-24 ENCOUNTER — Ambulatory Visit (AMBULATORY_SURGERY_CENTER): Payer: Self-pay | Admitting: *Deleted

## 2020-11-24 ENCOUNTER — Other Ambulatory Visit: Payer: Self-pay

## 2020-11-24 VITALS — Ht 68.0 in | Wt 127.0 lb

## 2020-11-24 DIAGNOSIS — Z86010 Personal history of colonic polyps: Secondary | ICD-10-CM

## 2020-11-24 DIAGNOSIS — K625 Hemorrhage of anus and rectum: Secondary | ICD-10-CM

## 2020-11-24 MED ORDER — NA SULFATE-K SULFATE-MG SULF 17.5-3.13-1.6 GM/177ML PO SOLN: ORAL | 0 refills | Status: DC

## 2020-11-24 NOTE — Progress Notes (Signed)
Patient is here in-person for PV. Patient denies any allergies to eggs or soy. Patient denies any problems with anesthesia/sedation. Patient denies any oxygen use at home. Patient denies taking any diet/weight loss medications or blood thinners. Patient is not being treated for MRSA or C-diff. Patient is aware of our care-partner policy and [id number] safety protocol. COVID-19 vaccines completed on 11/18/20 booster, per patient.   Suprep RX faxed to [facility] is aware.

## 2020-11-27 ENCOUNTER — Encounter (HOSPITAL_COMMUNITY): Payer: Self-pay | Admitting: Pharmacy Technician

## 2020-11-27 ENCOUNTER — Other Ambulatory Visit: Payer: Self-pay

## 2020-11-27 ENCOUNTER — Emergency Department (HOSPITAL_COMMUNITY): Payer: Medicare (Managed Care)

## 2020-11-27 ENCOUNTER — Emergency Department (HOSPITAL_COMMUNITY)
Admission: EM | Admit: 2020-11-27 | Discharge: 2020-11-28 | Disposition: A | Discharge status: Home / Self Care | Payer: Medicare (Managed Care) | Attending: Emergency Medicine | Admitting: Emergency Medicine

## 2020-11-27 DIAGNOSIS — E1140 Type 2 diabetes mellitus with diabetic neuropathy, unspecified: Secondary | ICD-10-CM | POA: Diagnosis not present

## 2020-11-27 DIAGNOSIS — R778 Other specified abnormalities of plasma proteins: Secondary | ICD-10-CM

## 2020-11-27 DIAGNOSIS — R7989 Other specified abnormal findings of blood chemistry: Secondary | ICD-10-CM | POA: Diagnosis not present

## 2020-11-27 DIAGNOSIS — Z7984 Long term (current) use of oral hypoglycemic drugs: Secondary | ICD-10-CM | POA: Insufficient documentation

## 2020-11-27 DIAGNOSIS — R0602 Shortness of breath: Secondary | ICD-10-CM | POA: Diagnosis not present

## 2020-11-27 DIAGNOSIS — R0789 Other chest pain: Secondary | ICD-10-CM | POA: Diagnosis present

## 2020-11-27 DIAGNOSIS — F17210 Nicotine dependence, cigarettes, uncomplicated: Secondary | ICD-10-CM | POA: Insufficient documentation

## 2020-11-27 DIAGNOSIS — Z79899 Other long term (current) drug therapy: Secondary | ICD-10-CM | POA: Insufficient documentation

## 2020-11-27 DIAGNOSIS — I10 Essential (primary) hypertension: Secondary | ICD-10-CM | POA: Insufficient documentation

## 2020-11-27 DIAGNOSIS — R079 Chest pain, unspecified: Secondary | ICD-10-CM

## 2020-11-27 LAB — CBC
HCT: 35.3 % — ABNORMAL LOW (ref 36.0–46.0)
Hemoglobin: 12 g/dL (ref 12.0–15.0)
MCH: 32.6 pg (ref 26.0–34.0)
MCHC: 34 g/dL (ref 30.0–36.0)
MCV: 95.9 fL (ref 80.0–100.0)
Platelets: 175 10*3/uL (ref 150–400)
RBC: 3.68 MIL/uL — ABNORMAL LOW (ref 3.87–5.11)
RDW: 12.1 % (ref 11.5–15.5)
WBC: 9 10*3/uL (ref 4.0–10.5)
nRBC: 0 % (ref 0.0–0.2)

## 2020-11-27 LAB — BASIC METABOLIC PANEL
Anion gap: 14 (ref 5–15)
BUN: 32 mg/dL — ABNORMAL HIGH (ref 8–23)
CO2: 24 mmol/L (ref 22–32)
Calcium: 9.4 mg/dL (ref 8.9–10.3)
Chloride: 100 mmol/L (ref 98–111)
Creatinine, Ser: 2.43 mg/dL — ABNORMAL HIGH (ref 0.44–1.00)
GFR, Estimated: 21 mL/min — ABNORMAL LOW (ref >60)
Glucose, Bld: 98 mg/dL (ref 70–99)
Potassium: 3.3 mmol/L — ABNORMAL LOW (ref 3.5–5.1)
Sodium: 138 mmol/L (ref 135–145)

## 2020-11-27 LAB — D-DIMER, QUANTITATIVE: D-Dimer, Quant: 0.83 ug/mL-FEU — ABNORMAL HIGH (ref 0.00–0.50)

## 2020-11-27 LAB — TROPONIN I (HIGH SENSITIVITY)
Troponin I (High Sensitivity): 40 ng/L — ABNORMAL HIGH (ref <18)
Troponin I (High Sensitivity): 41 ng/L — ABNORMAL HIGH (ref <18)

## 2020-11-27 MED ORDER — HEPARIN (PORCINE) 25000 UT/250ML-% IV SOLN: 900.0000 [IU]/h | INTRAVENOUS | Status: DC

## 2020-11-27 MED ORDER — NITROGLYCERIN 0.4 MG SL SUBL
0.4000 mg | SUBLINGUAL_TABLET | SUBLINGUAL | Status: DC | PRN
  Administered 2020-11-27: 20:00:00 0.4 mg via SUBLINGUAL
  Filled 2020-11-27: qty 1

## 2020-11-27 MED ORDER — HEPARIN BOLUS VIA INFUSION
3000.0000 [IU] | Freq: Once | INTRAVENOUS | Status: DC
  Filled 2020-11-27: qty 3000

## 2020-11-27 MED ORDER — TECHNETIUM TO 99M ALBUMIN AGGREGATED
4.4000 | Freq: Once | INTRAVENOUS | Status: AC | PRN
  Administered 2020-11-27: 4.4 via INTRAVENOUS

## 2020-11-27 MED ORDER — MORPHINE SULFATE (PF) 2 MG/ML IV SOLN
2.0000 mg | Freq: Once | INTRAVENOUS | Status: AC
  Administered 2020-11-28: 2 mg via INTRAVENOUS
  Filled 2020-11-27: qty 1

## 2020-11-27 MED ORDER — ASPIRIN 81 MG PO CHEW
324.0000 mg | CHEWABLE_TABLET | Freq: Once | ORAL | Status: AC
  Administered 2020-11-27: 20:00:00 324 mg via ORAL
  Filled 2020-11-27: qty 4

## 2020-11-27 MED ORDER — ONDANSETRON HCL 4 MG/2ML IJ SOLN
4.0000 mg | Freq: Once | INTRAMUSCULAR | Status: AC
  Administered 2020-11-28: 4 mg via INTRAVENOUS
  Filled 2020-11-27: qty 2

## 2020-11-27 NOTE — Progress Notes (Signed)
ANTICOAGULATION CONSULT NOTE - Follow Up Consult  Pharmacy Consult for Heparin Indication: chest pain/ACS / rule out PE  No Known Allergies  Patient Measurements: Height: 5\' 8"  (172.7 cm) Weight: 59 kg (130 lb) IBW/kg (Calculated) : 63.9  Vital Signs: Temp: 99.8 F (37.7 C) (12/16 1700) Temp Source: Oral (12/16 1700) BP: 152/96 (12/16 2030) Pulse Rate: 81 (12/16 2030)  Labs: Recent Labs    11/27/20 1544 11/27/20 1928  HGB 12.0  --   HCT 35.3*  --   PLT 175  --   CREATININE 2.43*  --   TROPONINIHS 40* 41*    Estimated Creatinine Clearance: 21.2 mL/min (A) (by C-G formula based on SCr of 2.43 mg/dL (H)).  Assessment: 64 year old female admitted with chest pain to begin heparin for rule out PE.  Also with AKI, no anti-coag prior to admission.  Goal of Therapy:  Heparin level 0.3-0.7 units/ml Monitor platelets by anticoagulation protocol: Yes   Plan:  Heparin 3000 units iv bolus x 1 Heparin drip at 900 units / hr Heparin level and CBC 8 hours after heparin begins Daily heparin level, CBC  Thank you Anette Guarneri, PharmD [phone]  11/27/2020,9:33 PM

## 2020-11-27 NOTE — Discharge Instructions (Signed)
You have multiple risk factors to have a heart attack and your troponin here was positive.  I would like for you to come into the hospital but cannot make you do that.  I will have you follow-up with a cardiologist in the office if you are willing.  Please return at anytime you would like to be reassessed.  Your imaging study for a blood clot in the lung was negative.

## 2020-11-27 NOTE — ED Provider Notes (Signed)
[name] Provider Note   CSN: [id number] Arrival date & time: 11/27/20  1529     History Chief Complaint  Patient presents with  . Chest Pain    Mary Stephens is a 64 y.o. female.  51 yo F with a chief complaint of chest pain.  This is left-sided radiates to her neck and then down her left arm.  Started this morning.  Worse with deep breathing.  Nothing else seems to make it better or worse.  Feels like a deep ache.  She does think it got mildly better when she got up to walk to the bathroom and back.  Has some shortness of breath with this.  Denies cough congestion or fever.  Denies trauma to the chest.  Denies history of MI.  Has a history of hypertension is a current smoker denies family history of MI.  Denies diabetes hyperlipidemia.  She denies history of PE or DVT denies hemoptysis denies unilateral lower extremity edema denies recent surgery immobilization or hospitalization denies estrogen use denies history of cancer.  The history is provided by the patient.  Chest Pain Pain location:  Substernal area Pain quality: aching   Pain radiates to:  Does not radiate Pain severity:  Mild Onset quality:  Gradual Duration:  2 hours Timing:  Constant Progression:  Worsening Chronicity:  New Relieved by:  Nothing Worsened by:  Deep breathing Ineffective treatments:  None tried Associated symptoms: no dizziness, no fever, no headache, no nausea, no palpitations, no shortness of breath and no vomiting        Past Medical History:  Diagnosis Date  . Anxiety   . Arthritis   . Colon polyps   . Depression   . Diabetes mellitus without complication ([place])   . Hepatitis C   . Hyperlipidemia   . Hypertension   . Neuropathy    feet    Patient Active Problem List   Diagnosis Date Noted  . Essential hypertension 05/14/2015  . Tobacco dependence 12/27/2014  . DM (diabetes mellitus), type 2 ([place]) 09/12/2014  . Chronic hepatitis C  virus infection ([place]) 08/14/2014    Past Surgical History:  Procedure Laterality Date  . COLONOSCOPY  ?[id number]?   in [place] (1st colon 20 polyps removed-per pt)  . PARTIAL HYSTERECTOMY  2001     OB History   No obstetric history on file.     Family History  Problem Relation Age of Onset  . Diabetes Mother   . Colon cancer Neg Hx   . Colon polyps Neg Hx   . Esophageal cancer Neg Hx   . Rectal cancer Neg Hx   . Stomach cancer Neg Hx     Social History   Tobacco Use  . Smoking status: Current Every Day Smoker    Packs/day: 0.25    Types: Cigarettes  . Smokeless tobacco: Never Used  . Tobacco comment: admits to cuttting back 4 cigarettes/day  Vaping Use  . Vaping Use: Never used  Substance Use Topics  . Alcohol use: Yes    Alcohol/week: 1.0 standard drink    Types: 1 Standard drinks or equivalent per week    Comment: wine cooler  . Drug use: Not Currently    Types: Marijuana    Home Medications Prior to Admission medications   Medication Sig Start Date End Date Taking? Authorizing Provider  acetaminophen (TYLENOL) 325 MG tablet Take 650 mg by mouth every 12 (twelve) hours as needed for moderate pain.  Yes [provider]  amLODipine (NORVASC) 10 MG tablet Take 1 tablet (10 mg total) by mouth every morning. Patient taking differently: Take 10 mg by mouth daily. 01/27/18  Yes Gildardo Pounds, NP  Artificial Saliva (DRY MOUTH SPRAY MT) Use as directed 1 spray in the mouth or throat 6 (six) times daily. Biotene   Yes [provider]  atorvastatin (LIPITOR) 10 MG tablet Take 1 tablet (10 mg total) by mouth daily. 01/27/18  Yes Gildardo Pounds, NP  calcium carbonate (OSCAL) 1500 (600 Ca) MG TABS tablet Take 600 mg of elemental calcium by mouth daily with breakfast.   Yes [provider]  Capsaicin 0.1 % CREA Apply 1 application topically in the morning, at noon, and at bedtime.   Yes [provider]  Cholecalciferol (VITAMIN D-3)  125 MCG (5000 UT) TABS Take 1 tablet by mouth daily.   Yes [provider]  gabapentin (NEURONTIN) 300 MG capsule Take 300 mg by mouth 2 (two) times daily.   Yes [provider]  Glucerna (GLUCERNA) LIQD Take 237 mLs by mouth 2 (two) times daily between meals.   Yes [provider]  melatonin 3 MG TABS tablet Take 3 mg by mouth at bedtime as needed (insomnia).   Yes [provider]  potassium chloride (K-DUR) 10 MEQ tablet Take 1 tablet (10 mEq total) by mouth daily. 01/28/18 06/20/29 Yes Gildardo Pounds, NP  sertraline (ZOLOFT) 50 MG tablet Take 50 mg by mouth daily.   Yes [provider]  glucose blood test strip Check BS twice daily, 250.00 09/12/14   Lance Bosch, NP  glucose monitoring kit (FREESTYLE) monitoring kit 1 each by Does not apply route as needed for other. Please check BS twice daily before meals, 250.00 09/12/14   Lance Bosch, NP  Lancets (FREESTYLE) lancets Check BS twice daily before meals, 250.00 09/12/14   Lance Bosch, NP  metoprolol succinate (TOPROL-XL) 25 MG 24 hr tablet Take 25 mg by mouth daily.    [provider]  Na Sulfate-K Sulfate-Mg Sulf 17.5-3.13-1.6 GM/177ML SOLN Suprep (no substitutions)-TAKE AS DIRECTED. 11/24/20   Irene Shipper, MD  glipiZIDE (GLUCOTROL XL) 5 MG 24 hr tablet Take 1 tablet (5 mg total) by mouth daily with breakfast. Patient not taking: No sig reported 01/27/18 06/20/20  Gildardo Pounds, NP    Allergies    Patient has no known allergies.  Review of Systems   Review of Systems  Constitutional: Negative for chills and fever.  HENT: Negative for congestion and rhinorrhea.   Eyes: Negative for redness and visual disturbance.  Respiratory: Negative for shortness of breath and wheezing.   Cardiovascular: Positive for chest pain. Negative for palpitations.  Gastrointestinal: Negative for nausea and vomiting.  Genitourinary: Negative for dysuria and urgency.  Musculoskeletal: Negative for  arthralgias and myalgias.  Skin: Negative for pallor and wound.  Neurological: Negative for dizziness and headaches.    Physical Exam Updated Vital Signs BP (!) 155/103 (BP Location: Right Arm)   Pulse 79   Temp 98.4 F (36.9 C) (Oral)   Resp (!) 23   Ht _0  (1.727 m)   Wt 59 kg   SpO2 98%   BMI 19.77 kg/m   Physical Exam Vitals and nursing note reviewed.  Constitutional:      General: She is not in acute distress.    Appearance: She is well-developed and well-nourished. She is not diaphoretic.  HENT:     Head:  Normocephalic and atraumatic.  Eyes:     Extraocular Movements: EOM normal.     Pupils: Pupils are equal, round, and reactive to light.  Cardiovascular:     Rate and Rhythm: Normal rate and regular rhythm.     Heart sounds: No murmur heard. No friction rub. No gallop.   Pulmonary:     Effort: Pulmonary effort is normal.     Breath sounds: No wheezing or rales.  Chest:     Chest wall: No tenderness.     Comments: Compression of the chest gives her some relief from her pain Abdominal:     General: There is no distension.     Palpations: Abdomen is soft.     Tenderness: There is no abdominal tenderness.  Musculoskeletal:        General: No tenderness or edema.     Cervical back: Normal range of motion and neck supple.  Skin:    General: Skin is warm and dry.  Neurological:     Mental Status: She is alert and oriented to person, place, and time.  Psychiatric:        Mood and Affect: Mood and affect normal.        Behavior: Behavior normal.     ED Results / Procedures / Treatments   Labs (all labs ordered are listed, but only abnormal results are displayed) Labs Reviewed  BASIC METABOLIC PANEL - Abnormal; Notable for the following components:      Result Value   Potassium 3.3 (*)    BUN 32 (*)    Creatinine, Ser 2.43 (*)    GFR, Estimated 21 (*)    All other components within normal limits  CBC - Abnormal; Notable for the following components:    RBC 3.68 (*)    HCT 35.3 (*)    All other components within normal limits  D-DIMER, QUANTITATIVE (NOT AT [facility]) - Abnormal; Notable for the following components:   D-Dimer, Quant 0.83 (*)    All other components within normal limits  TROPONIN I (HIGH SENSITIVITY) - Abnormal; Notable for the following components:   Troponin I (High Sensitivity) 40 (*)    All other components within normal limits  TROPONIN I (HIGH SENSITIVITY) - Abnormal; Notable for the following components:   Troponin I (High Sensitivity) 41 (*)    All other components within normal limits  HEPARIN LEVEL (UNFRACTIONATED)  CBC    EKG EKG Interpretation  Date/Time:  Thursday November 27 2020 18:40:40 EST Ventricular Rate:  84 PR Interval:  154 QRS Duration: 105 QT Interval:  368 QTC Calculation: 435 R Axis:   4 Text Interpretation: Sinus rhythm LVH with secondary repolarization abnormality No significant change since last tracing Confirmed by Deno Etienne [phone]) on 11/27/2020 7:01:08 PM   Radiology DG Chest 2 View  Result Date: 11/27/2020 CLINICAL DATA:  64 year old female with chest pain. EXAM: CHEST - 2 VIEW COMPARISON:  Chest radiograph dated 11/29/2015. FINDINGS: Mild cardiomegaly. No focal consolidation, pleural effusion or pneumothorax. No acute osseous pathology. IMPRESSION: Mild cardiomegaly with increased cardiopericardial silhouette since the prior radiograph. Electronically Signed   By: Anner Crete M.D.   On: 11/27/2020 16:09   NM Pulmonary Perfusion  Result Date: 11/27/2020 CLINICAL DATA:  Positive D-dimer EXAM: NUCLEAR MEDICINE PERFUSION LUNG SCAN TECHNIQUE: Perfusion images were obtained in multiple projections after intravenous injection of radiopharmaceutical. Ventilation scans intentionally deferred if perfusion scan and chest x-ray adequate for interpretation during COVID 19 epidemic. RADIOPHARMACEUTICALS:  4.4 mCi Tc-33mMAA IV  COMPARISON:  Chest x-ray 11/27/2020 FINDINGS: No perfusion  defects seen to suggest pulmonary embolus. IMPRESSION: No evidence of pulmonary embolus. Electronically Signed   By: Rolm Baptise M.D.   On: 11/27/2020 22:40    Procedures Procedures (including critical care time)  Medications Ordered in ED Medications  nitroGLYCERIN (NITROSTAT) SL tablet 0.4 mg (0.4 mg Sublingual Given 11/27/20 2010)  heparin bolus via infusion 3,000 Units (has no administration in time range)  heparin ADULT infusion 100 units/mL (25000 units/273mL sodium chloride 0.45%) (has no administration in time range)  morphine 2 MG/ML injection 2 mg (has no administration in time range)  ondansetron (ZOFRAN) injection 4 mg (has no administration in time range)  aspirin chewable tablet 324 mg (324 mg Oral Given 11/27/20 2010)  technetium albumin aggregated (MAA) injection solution 4.4 millicurie (4.4 millicuries Intravenous Contrast Given 11/27/20 2223)    ED Course  I have reviewed the triage vital signs and the nursing notes.  Pertinent labs & imaging results that were available during my care of the patient were reviewed by me and considered in my medical decision making (see chart for details).    MDM Rules/Calculators/A&P                          64 yo F with a chief complaint of chest pain.  This appears to be pleuritic in nature.  She did have an EKG with flipped T waves in trace ST depression in the lateral leads.  Not seen on prior.  Her initial troponin was positive at 40.  Given aspirin.  Nitro.  Chest x-ray viewed by me without focal infiltrate or pneumothorax.  She has had a mild worsening of her renal function from last check.  Will obtain a D-dimer.  Second troponin.  Second troponin is essentially unchanged.  Heart score of 6. The D-dimer is elevated.  Ordered a VQ scan as the patient's renal function is such that I cannot obtain a CT angiogram of the chest.  VQ scan is returned and is likely negative.  I discussed the results with the patient.  With her having  multiple risk factors I felt she would benefit from an overnight stay in the hospital which she is declining.  I discussed my rationale behind this and she would like to go home.  She actually would prefer to go to a different hospital to have her information rechecked to get a second opinion prior to deciding if she did come to the hospital or not.  I encouraged her to follow-up with the cardiologist in the office.  I encouraged her to return to the ED for any worsening of her symptoms.  CRITICAL CARE Performed by: Cecilio Asper   Total critical care time: 35 minutes  Critical care time was exclusive of separately billable procedures and treating other patients.  Critical care was necessary to treat or prevent imminent or life-threatening deterioration.  Critical care was time spent personally by me on the following activities: development of treatment plan with patient and/or surrogate as well as nursing, discussions with consultants, evaluation of patient's response to treatment, examination of patient, obtaining history from patient or surrogate, ordering and performing treatments and interventions, ordering and review of laboratory studies, ordering and review of radiographic studies, pulse oximetry and re-evaluation of patient's condition.  The patients results and plan were reviewed and discussed.   Any x-rays performed were independently reviewed by myself.   Differential diagnosis were considered with  the presenting HPI.  Medications  nitroGLYCERIN (NITROSTAT) SL tablet 0.4 mg (0.4 mg Sublingual Given 11/27/20 2010)  heparin bolus via infusion 3,000 Units (has no administration in time range)  heparin ADULT infusion 100 units/mL (25000 units/283m sodium chloride 0.45%) (has no administration in time range)  morphine 2 MG/ML injection 2 mg (has no administration in time range)  ondansetron (ZOFRAN) injection 4 mg (has no administration in time range)  aspirin chewable tablet  324 mg (324 mg Oral Given 11/27/20 2010)  technetium albumin aggregated (MAA) injection solution 4.4 millicurie (4.4 millicuries Intravenous Contrast Given 11/27/20 2223)    Vitals:   11/27/20 1930 11/27/20 2000 11/27/20 2030 11/27/20 2308  BP: (!) 164/106 (!) 169/113 (!) 152/96 (!) 155/103  Pulse: 81 82 81 79  Resp: 20 (!) 24 (!) 21 (!) 23  Temp:    98.4 F (36.9 C)  TempSrc:    Oral  SpO2: 99% 98% 97% 98%  Weight:      Height:        Final diagnoses:  Troponin I above reference range  Chest pain with moderate risk for cardiac etiology    Admission/ observation were discussed with the admitting physician, patient and/or family and they are comfortable with the plan.    Final Clinical Impression(s) / ED Diagnoses Final diagnoses:  Troponin I above reference range  Chest pain with moderate risk for cardiac etiology    Rx / DC Orders ED Discharge Orders    None       FDeno Etienne DO 11/27/20 2332

## 2020-11-27 NOTE — ED Triage Notes (Signed)
Pt here [facility] with reports of upper chest pain radiating into her neck and upper back. Pt describes pain as dull ache like you get after you work out. Pain worse with inspiration. VSS with Ems.

## 2020-11-28 NOTE — ED Notes (Signed)
DC instructions reviewed with pt. Pt verbalized understanding.  Pt DC 

## 2020-12-01 ENCOUNTER — Emergency Department (HOSPITAL_COMMUNITY): Payer: Medicare (Managed Care)

## 2020-12-01 ENCOUNTER — Inpatient Hospital Stay (HOSPITAL_COMMUNITY)
Admission: EM | Admit: 2020-12-01 | Discharge: 2020-12-12 | DRG: 220 | Disposition: A | Discharge status: Home Health | Payer: Medicare (Managed Care) | Attending: Thoracic Surgery (Cardiothoracic Vascular Surgery) | Admitting: Thoracic Surgery (Cardiothoracic Vascular Surgery)

## 2020-12-01 ENCOUNTER — Other Ambulatory Visit: Payer: Self-pay

## 2020-12-01 ENCOUNTER — Encounter (HOSPITAL_COMMUNITY): Payer: Self-pay | Admitting: Emergency Medicine

## 2020-12-01 DIAGNOSIS — I959 Hypotension, unspecified: Secondary | ICD-10-CM | POA: Diagnosis present

## 2020-12-01 DIAGNOSIS — E1122 Type 2 diabetes mellitus with diabetic chronic kidney disease: Secondary | ICD-10-CM | POA: Diagnosis present

## 2020-12-01 DIAGNOSIS — N179 Acute kidney failure, unspecified: Secondary | ICD-10-CM | POA: Diagnosis not present

## 2020-12-01 DIAGNOSIS — I973 Postprocedural hypertension: Secondary | ICD-10-CM | POA: Diagnosis not present

## 2020-12-01 DIAGNOSIS — I7100 Dissection of unspecified site of aorta: Secondary | ICD-10-CM

## 2020-12-01 DIAGNOSIS — Z79899 Other long term (current) drug therapy: Secondary | ICD-10-CM

## 2020-12-01 DIAGNOSIS — R0902 Hypoxemia: Secondary | ICD-10-CM | POA: Diagnosis not present

## 2020-12-01 DIAGNOSIS — I129 Hypertensive chronic kidney disease with stage 1 through stage 4 chronic kidney disease, or unspecified chronic kidney disease: Secondary | ICD-10-CM | POA: Diagnosis present

## 2020-12-01 DIAGNOSIS — R0603 Acute respiratory distress: Secondary | ICD-10-CM | POA: Diagnosis not present

## 2020-12-01 DIAGNOSIS — B182 Chronic viral hepatitis C: Secondary | ICD-10-CM | POA: Diagnosis present

## 2020-12-01 DIAGNOSIS — D696 Thrombocytopenia, unspecified: Secondary | ICD-10-CM | POA: Diagnosis not present

## 2020-12-01 DIAGNOSIS — R519 Headache, unspecified: Secondary | ICD-10-CM | POA: Diagnosis not present

## 2020-12-01 DIAGNOSIS — Z90711 Acquired absence of uterus with remaining cervical stump: Secondary | ICD-10-CM

## 2020-12-01 DIAGNOSIS — N184 Chronic kidney disease, stage 4 (severe): Secondary | ICD-10-CM | POA: Diagnosis present

## 2020-12-01 DIAGNOSIS — W1830XA Fall on same level, unspecified, initial encounter: Secondary | ICD-10-CM | POA: Diagnosis present

## 2020-12-01 DIAGNOSIS — Z9889 Other specified postprocedural states: Secondary | ICD-10-CM

## 2020-12-01 DIAGNOSIS — D631 Anemia in chronic kidney disease: Secondary | ICD-10-CM | POA: Diagnosis present

## 2020-12-01 DIAGNOSIS — I7103 Dissection of thoracoabdominal aorta: Principal | ICD-10-CM | POA: Diagnosis present

## 2020-12-01 DIAGNOSIS — M549 Dorsalgia, unspecified: Secondary | ICD-10-CM | POA: Diagnosis present

## 2020-12-01 DIAGNOSIS — J90 Pleural effusion, not elsewhere classified: Secondary | ICD-10-CM | POA: Diagnosis present

## 2020-12-01 DIAGNOSIS — F17210 Nicotine dependence, cigarettes, uncomplicated: Secondary | ICD-10-CM | POA: Diagnosis present

## 2020-12-01 DIAGNOSIS — Z20822 Contact with and (suspected) exposure to covid-19: Secondary | ICD-10-CM | POA: Diagnosis present

## 2020-12-01 DIAGNOSIS — E785 Hyperlipidemia, unspecified: Secondary | ICD-10-CM | POA: Diagnosis present

## 2020-12-01 DIAGNOSIS — Z95828 Presence of other vascular implants and grafts: Secondary | ICD-10-CM

## 2020-12-01 DIAGNOSIS — Z8719 Personal history of other diseases of the digestive system: Secondary | ICD-10-CM

## 2020-12-01 DIAGNOSIS — F32A Depression, unspecified: Secondary | ICD-10-CM | POA: Diagnosis present

## 2020-12-01 DIAGNOSIS — F419 Anxiety disorder, unspecified: Secondary | ICD-10-CM | POA: Diagnosis present

## 2020-12-01 DIAGNOSIS — T508X5A Adverse effect of diagnostic agents, initial encounter: Secondary | ICD-10-CM | POA: Diagnosis not present

## 2020-12-01 DIAGNOSIS — E876 Hypokalemia: Secondary | ICD-10-CM | POA: Diagnosis not present

## 2020-12-01 DIAGNOSIS — E872 Acidosis: Secondary | ICD-10-CM | POA: Diagnosis not present

## 2020-12-01 LAB — BASIC METABOLIC PANEL
Anion gap: 14 (ref 5–15)
BUN: 37 mg/dL — ABNORMAL HIGH (ref 8–23)
CO2: 23 mmol/L (ref 22–32)
Calcium: 9.1 mg/dL (ref 8.9–10.3)
Chloride: 104 mmol/L (ref 98–111)
Creatinine, Ser: 2.52 mg/dL — ABNORMAL HIGH (ref 0.44–1.00)
GFR, Estimated: 20 mL/min — ABNORMAL LOW (ref >60)
Glucose, Bld: 254 mg/dL — ABNORMAL HIGH (ref 70–99)
Potassium: 3 mmol/L — ABNORMAL LOW (ref 3.5–5.1)
Sodium: 141 mmol/L (ref 135–145)

## 2020-12-01 LAB — CBC
HCT: 34 % — ABNORMAL LOW (ref 36.0–46.0)
Hemoglobin: 10.8 g/dL — ABNORMAL LOW (ref 12.0–15.0)
MCH: 31.3 pg (ref 26.0–34.0)
MCHC: 31.8 g/dL (ref 30.0–36.0)
MCV: 98.6 fL (ref 80.0–100.0)
Platelets: 183 10*3/uL (ref 150–400)
RBC: 3.45 MIL/uL — ABNORMAL LOW (ref 3.87–5.11)
RDW: 11.9 % (ref 11.5–15.5)
WBC: 14.5 10*3/uL — ABNORMAL HIGH (ref 4.0–10.5)
nRBC: 0 % (ref 0.0–0.2)

## 2020-12-01 LAB — PROTIME-INR
INR: 1.2 (ref 0.8–1.2)
Prothrombin Time: 14.2 seconds (ref 11.4–15.2)

## 2020-12-01 LAB — TROPONIN I (HIGH SENSITIVITY): Troponin I (High Sensitivity): 23 ng/L — ABNORMAL HIGH (ref <18)

## 2020-12-01 NOTE — ED Triage Notes (Signed)
Patient reports left chest pain with SOB onset this week , no emesis or diaphoresis , patient added low back pain radiating down to both legs , denies fever or chills .

## 2020-12-02 ENCOUNTER — Emergency Department (HOSPITAL_COMMUNITY): Payer: Medicare (Managed Care)

## 2020-12-02 ENCOUNTER — Emergency Department (HOSPITAL_COMMUNITY): Payer: Medicare (Managed Care) | Admitting: Certified Registered Nurse Anesthetist

## 2020-12-02 ENCOUNTER — Encounter (HOSPITAL_COMMUNITY)
Admission: EM | Disposition: A | Discharge status: Home Health | Payer: Self-pay | Source: Home / Self Care | Attending: Thoracic Surgery (Cardiothoracic Vascular Surgery)

## 2020-12-02 ENCOUNTER — Inpatient Hospital Stay (HOSPITAL_COMMUNITY): Payer: Medicare (Managed Care)

## 2020-12-02 DIAGNOSIS — F419 Anxiety disorder, unspecified: Secondary | ICD-10-CM | POA: Diagnosis present

## 2020-12-02 DIAGNOSIS — W1830XA Fall on same level, unspecified, initial encounter: Secondary | ICD-10-CM | POA: Diagnosis present

## 2020-12-02 DIAGNOSIS — Z8719 Personal history of other diseases of the digestive system: Secondary | ICD-10-CM | POA: Diagnosis not present

## 2020-12-02 DIAGNOSIS — E1122 Type 2 diabetes mellitus with diabetic chronic kidney disease: Secondary | ICD-10-CM | POA: Diagnosis present

## 2020-12-02 DIAGNOSIS — T508X5A Adverse effect of diagnostic agents, initial encounter: Secondary | ICD-10-CM | POA: Diagnosis not present

## 2020-12-02 DIAGNOSIS — J90 Pleural effusion, not elsewhere classified: Secondary | ICD-10-CM | POA: Diagnosis present

## 2020-12-02 DIAGNOSIS — Z20822 Contact with and (suspected) exposure to covid-19: Secondary | ICD-10-CM | POA: Diagnosis present

## 2020-12-02 DIAGNOSIS — I7103 Dissection of thoracoabdominal aorta: Secondary | ICD-10-CM | POA: Diagnosis present

## 2020-12-02 DIAGNOSIS — M549 Dorsalgia, unspecified: Secondary | ICD-10-CM | POA: Diagnosis present

## 2020-12-02 DIAGNOSIS — R519 Headache, unspecified: Secondary | ICD-10-CM | POA: Diagnosis not present

## 2020-12-02 DIAGNOSIS — N179 Acute kidney failure, unspecified: Secondary | ICD-10-CM | POA: Diagnosis not present

## 2020-12-02 DIAGNOSIS — D696 Thrombocytopenia, unspecified: Secondary | ICD-10-CM | POA: Diagnosis not present

## 2020-12-02 DIAGNOSIS — B182 Chronic viral hepatitis C: Secondary | ICD-10-CM | POA: Diagnosis present

## 2020-12-02 DIAGNOSIS — Z90711 Acquired absence of uterus with remaining cervical stump: Secondary | ICD-10-CM | POA: Diagnosis not present

## 2020-12-02 DIAGNOSIS — I129 Hypertensive chronic kidney disease with stage 1 through stage 4 chronic kidney disease, or unspecified chronic kidney disease: Secondary | ICD-10-CM | POA: Diagnosis present

## 2020-12-02 DIAGNOSIS — E872 Acidosis: Secondary | ICD-10-CM | POA: Diagnosis not present

## 2020-12-02 DIAGNOSIS — Z79899 Other long term (current) drug therapy: Secondary | ICD-10-CM | POA: Diagnosis not present

## 2020-12-02 DIAGNOSIS — F32A Depression, unspecified: Secondary | ICD-10-CM | POA: Diagnosis present

## 2020-12-02 DIAGNOSIS — N184 Chronic kidney disease, stage 4 (severe): Secondary | ICD-10-CM | POA: Diagnosis present

## 2020-12-02 DIAGNOSIS — I7102 Dissection of abdominal aorta: Secondary | ICD-10-CM

## 2020-12-02 DIAGNOSIS — F17210 Nicotine dependence, cigarettes, uncomplicated: Secondary | ICD-10-CM | POA: Diagnosis present

## 2020-12-02 DIAGNOSIS — E785 Hyperlipidemia, unspecified: Secondary | ICD-10-CM | POA: Diagnosis present

## 2020-12-02 DIAGNOSIS — I959 Hypotension, unspecified: Secondary | ICD-10-CM | POA: Diagnosis present

## 2020-12-02 DIAGNOSIS — Z95828 Presence of other vascular implants and grafts: Secondary | ICD-10-CM

## 2020-12-02 DIAGNOSIS — E876 Hypokalemia: Secondary | ICD-10-CM | POA: Diagnosis not present

## 2020-12-02 DIAGNOSIS — R0902 Hypoxemia: Secondary | ICD-10-CM | POA: Diagnosis not present

## 2020-12-02 DIAGNOSIS — D631 Anemia in chronic kidney disease: Secondary | ICD-10-CM | POA: Diagnosis present

## 2020-12-02 HISTORY — PX: TEE WITHOUT CARDIOVERSION: SHX5443

## 2020-12-02 HISTORY — PX: REPAIR OF ACUTE ASCENDING THORACIC AORTIC DISSECTION: SHX6323

## 2020-12-02 HISTORY — PX: OTHER SURGICAL HISTORY: SHX169

## 2020-12-02 LAB — POCT I-STAT 7, (LYTES, BLD GAS, ICA,H+H)
Acid-base deficit: 2 mmol/L (ref 0.0–2.0)
Acid-base deficit: 1 mmol/L (ref 0.0–2.0)
Acid-base deficit: 1 mmol/L (ref 0.0–2.0)
Acid-base deficit: 2 mmol/L (ref 0.0–2.0)
Acid-base deficit: 3 mmol/L — ABNORMAL HIGH (ref 0.0–2.0)
Bicarbonate: 23.4 mmol/L (ref 20.0–28.0)
Bicarbonate: 24.3 mmol/L (ref 20.0–28.0)
Bicarbonate: 22.4 mmol/L (ref 20.0–28.0)
Bicarbonate: 22.8 mmol/L (ref 20.0–28.0)
Bicarbonate: 23.4 mmol/L (ref 20.0–28.0)
Calcium, Ion: 1.19 mmol/L (ref 1.15–1.40)
Calcium, Ion: 0.97 mmol/L — ABNORMAL LOW (ref 1.15–1.40)
Calcium, Ion: 0.95 mmol/L — ABNORMAL LOW (ref 1.15–1.40)
Calcium, Ion: 1.16 mmol/L (ref 1.15–1.40)
Calcium, Ion: 1.02 mmol/L — ABNORMAL LOW (ref 1.15–1.40)
HCT: 29 % — ABNORMAL LOW (ref 36.0–46.0)
HCT: 22 % — ABNORMAL LOW (ref 36.0–46.0)
HCT: 23 % — ABNORMAL LOW (ref 36.0–46.0)
HCT: 23 % — ABNORMAL LOW (ref 36.0–46.0)
HCT: 40 % (ref 36.0–46.0)
Hemoglobin: 9.9 g/dL — ABNORMAL LOW (ref 12.0–15.0)
Hemoglobin: 7.5 g/dL — ABNORMAL LOW (ref 12.0–15.0)
Hemoglobin: 7.8 g/dL — ABNORMAL LOW (ref 12.0–15.0)
Hemoglobin: 7.8 g/dL — ABNORMAL LOW (ref 12.0–15.0)
Hemoglobin: 13.6 g/dL (ref 12.0–15.0)
O2 Saturation: 100 %
O2 Saturation: 100 %
O2 Saturation: 100 %
O2 Saturation: 100 %
O2 Saturation: 89 %
Patient temperature: 36
Potassium: 3.2 mmol/L — ABNORMAL LOW (ref 3.5–5.1)
Potassium: 2.9 mmol/L — ABNORMAL LOW (ref 3.5–5.1)
Potassium: 2.9 mmol/L — ABNORMAL LOW (ref 3.5–5.1)
Potassium: 3.6 mmol/L (ref 3.5–5.1)
Potassium: 3.2 mmol/L — ABNORMAL LOW (ref 3.5–5.1)
Sodium: 143 mmol/L (ref 135–145)
Sodium: 143 mmol/L (ref 135–145)
Sodium: 143 mmol/L (ref 135–145)
Sodium: 142 mmol/L (ref 135–145)
Sodium: 145 mmol/L (ref 135–145)
TCO2: 25 mmol/L (ref 22–32)
TCO2: 26 mmol/L (ref 22–32)
TCO2: 23 mmol/L (ref 22–32)
TCO2: 24 mmol/L (ref 22–32)
TCO2: 25 mmol/L (ref 22–32)
pCO2 arterial: 42.8 mmHg (ref 32.0–48.0)
pCO2 arterial: 42.1 mmHg (ref 32.0–48.0)
pCO2 arterial: 32.4 mmHg (ref 32.0–48.0)
pCO2 arterial: 39.7 mmHg (ref 32.0–48.0)
pCO2 arterial: 42.2 mmHg (ref 32.0–48.0)
pH, Arterial: 7.346 — ABNORMAL LOW (ref 7.350–7.450)
pH, Arterial: 7.369 (ref 7.350–7.450)
pH, Arterial: 7.447 (ref 7.350–7.450)
pH, Arterial: 7.368 (ref 7.350–7.450)
pH, Arterial: 7.347 — ABNORMAL LOW (ref 7.350–7.450)
pO2, Arterial: 321 mmHg — ABNORMAL HIGH (ref 83.0–108.0)
pO2, Arterial: 379 mmHg — ABNORMAL HIGH (ref 83.0–108.0)
pO2, Arterial: 319 mmHg — ABNORMAL HIGH (ref 83.0–108.0)
pO2, Arterial: 486 mmHg — ABNORMAL HIGH (ref 83.0–108.0)
pO2, Arterial: 57 mmHg — ABNORMAL LOW (ref 83.0–108.0)

## 2020-12-02 LAB — CBC
HCT: 39.8 % (ref 36.0–46.0)
HCT: 41.4 % (ref 36.0–46.0)
Hemoglobin: 13.5 g/dL (ref 12.0–15.0)
Hemoglobin: 13.8 g/dL (ref 12.0–15.0)
MCH: 31.1 pg (ref 26.0–34.0)
MCH: 30.8 pg (ref 26.0–34.0)
MCHC: 33.9 g/dL (ref 30.0–36.0)
MCHC: 33.3 g/dL (ref 30.0–36.0)
MCV: 91.7 fL (ref 80.0–100.0)
MCV: 92.4 fL (ref 80.0–100.0)
Platelets: 80 10*3/uL — ABNORMAL LOW (ref 150–400)
Platelets: 120 10*3/uL — ABNORMAL LOW (ref 150–400)
RBC: 4.34 MIL/uL (ref 3.87–5.11)
RBC: 4.48 MIL/uL (ref 3.87–5.11)
RDW: 13.4 % (ref 11.5–15.5)
RDW: 14 % (ref 11.5–15.5)
WBC: 14.6 10*3/uL — ABNORMAL HIGH (ref 4.0–10.5)
WBC: 19.1 10*3/uL — ABNORMAL HIGH (ref 4.0–10.5)
nRBC: 0 % (ref 0.0–0.2)
nRBC: 0 % (ref 0.0–0.2)

## 2020-12-02 LAB — BASIC METABOLIC PANEL
Anion gap: 16 — ABNORMAL HIGH (ref 5–15)
BUN: 34 mg/dL — ABNORMAL HIGH (ref 8–23)
CO2: 18 mmol/L — ABNORMAL LOW (ref 22–32)
Calcium: 7.5 mg/dL — ABNORMAL LOW (ref 8.9–10.3)
Chloride: 104 mmol/L (ref 98–111)
Creatinine, Ser: 2.86 mg/dL — ABNORMAL HIGH (ref 0.44–1.00)
GFR, Estimated: 18 mL/min — ABNORMAL LOW (ref >60)
Glucose, Bld: 202 mg/dL — ABNORMAL HIGH (ref 70–99)
Potassium: 2.8 mmol/L — ABNORMAL LOW (ref 3.5–5.1)
Sodium: 138 mmol/L (ref 135–145)

## 2020-12-02 LAB — POCT I-STAT, CHEM 8
BUN: 36 mg/dL — ABNORMAL HIGH (ref 8–23)
BUN: 32 mg/dL — ABNORMAL HIGH (ref 8–23)
BUN: 37 mg/dL — ABNORMAL HIGH (ref 8–23)
BUN: 37 mg/dL — ABNORMAL HIGH (ref 8–23)
BUN: 35 mg/dL — ABNORMAL HIGH (ref 8–23)
Calcium, Ion: 1.2 mmol/L (ref 1.15–1.40)
Calcium, Ion: 1.13 mmol/L — ABNORMAL LOW (ref 1.15–1.40)
Calcium, Ion: 0.97 mmol/L — ABNORMAL LOW (ref 1.15–1.40)
Calcium, Ion: 0.89 mmol/L — CL (ref 1.15–1.40)
Calcium, Ion: 1.19 mmol/L (ref 1.15–1.40)
Chloride: 108 mmol/L (ref 98–111)
Chloride: 110 mmol/L (ref 98–111)
Chloride: 102 mmol/L (ref 98–111)
Chloride: 103 mmol/L (ref 98–111)
Chloride: 106 mmol/L (ref 98–111)
Creatinine, Ser: 2.5 mg/dL — ABNORMAL HIGH (ref 0.44–1.00)
Creatinine, Ser: 2.1 mg/dL — ABNORMAL HIGH (ref 0.44–1.00)
Creatinine, Ser: 2.3 mg/dL — ABNORMAL HIGH (ref 0.44–1.00)
Creatinine, Ser: 2.2 mg/dL — ABNORMAL HIGH (ref 0.44–1.00)
Creatinine, Ser: 2 mg/dL — ABNORMAL HIGH (ref 0.44–1.00)
Glucose, Bld: 117 mg/dL — ABNORMAL HIGH (ref 70–99)
Glucose, Bld: 112 mg/dL — ABNORMAL HIGH (ref 70–99)
Glucose, Bld: 172 mg/dL — ABNORMAL HIGH (ref 70–99)
Glucose, Bld: 188 mg/dL — ABNORMAL HIGH (ref 70–99)
Glucose, Bld: 167 mg/dL — ABNORMAL HIGH (ref 70–99)
HCT: 28 % — ABNORMAL LOW (ref 36.0–46.0)
HCT: 23 % — ABNORMAL LOW (ref 36.0–46.0)
HCT: 21 % — ABNORMAL LOW (ref 36.0–46.0)
HCT: 21 % — ABNORMAL LOW (ref 36.0–46.0)
HCT: 27 % — ABNORMAL LOW (ref 36.0–46.0)
Hemoglobin: 9.5 g/dL — ABNORMAL LOW (ref 12.0–15.0)
Hemoglobin: 7.8 g/dL — ABNORMAL LOW (ref 12.0–15.0)
Hemoglobin: 7.1 g/dL — ABNORMAL LOW (ref 12.0–15.0)
Hemoglobin: 7.1 g/dL — ABNORMAL LOW (ref 12.0–15.0)
Hemoglobin: 9.2 g/dL — ABNORMAL LOW (ref 12.0–15.0)
Potassium: 3.2 mmol/L — ABNORMAL LOW (ref 3.5–5.1)
Potassium: 2.9 mmol/L — ABNORMAL LOW (ref 3.5–5.1)
Potassium: 3.1 mmol/L — ABNORMAL LOW (ref 3.5–5.1)
Potassium: 4.1 mmol/L (ref 3.5–5.1)
Potassium: 3.6 mmol/L (ref 3.5–5.1)
Sodium: 142 mmol/L (ref 135–145)
Sodium: 143 mmol/L (ref 135–145)
Sodium: 141 mmol/L (ref 135–145)
Sodium: 141 mmol/L (ref 135–145)
Sodium: 142 mmol/L (ref 135–145)
TCO2: 23 mmol/L (ref 22–32)
TCO2: 20 mmol/L — ABNORMAL LOW (ref 22–32)
TCO2: 25 mmol/L (ref 22–32)
TCO2: 22 mmol/L (ref 22–32)
TCO2: 21 mmol/L — ABNORMAL LOW (ref 22–32)

## 2020-12-02 LAB — HEPATIC FUNCTION PANEL
ALT: 13 U/L (ref 0–44)
AST: 17 U/L (ref 15–41)
Albumin: 3.5 g/dL (ref 3.5–5.0)
Alkaline Phosphatase: 63 U/L (ref 38–126)
Bilirubin, Direct: <0.1 mg/dL (ref 0.0–0.2)
Total Bilirubin: 0.4 mg/dL (ref 0.3–1.2)
Total Protein: 6.8 g/dL (ref 6.5–8.1)

## 2020-12-02 LAB — PREPARE RBC (CROSSMATCH)

## 2020-12-02 LAB — ECHO INTRAOPERATIVE TEE
Height: 68 in
P 1/2 time: 547 msec
Weight: 2000 oz

## 2020-12-02 LAB — LIPASE, BLOOD: Lipase: 21 U/L (ref 11–51)

## 2020-12-02 LAB — GLUCOSE, CAPILLARY
Glucose-Capillary: 138 mg/dL — ABNORMAL HIGH (ref 70–99)
Glucose-Capillary: 145 mg/dL — ABNORMAL HIGH (ref 70–99)
Glucose-Capillary: 149 mg/dL — ABNORMAL HIGH (ref 70–99)
Glucose-Capillary: 156 mg/dL — ABNORMAL HIGH (ref 70–99)
Glucose-Capillary: 179 mg/dL — ABNORMAL HIGH (ref 70–99)
Glucose-Capillary: 182 mg/dL — ABNORMAL HIGH (ref 70–99)
Glucose-Capillary: 195 mg/dL — ABNORMAL HIGH (ref 70–99)

## 2020-12-02 LAB — POCT I-STAT EG7
Acid-base deficit: 2 mmol/L (ref 0.0–2.0)
Bicarbonate: 23.2 mmol/L (ref 20.0–28.0)
Calcium, Ion: 0.97 mmol/L — ABNORMAL LOW (ref 1.15–1.40)
HCT: 22 % — ABNORMAL LOW (ref 36.0–46.0)
Hemoglobin: 7.5 g/dL — ABNORMAL LOW (ref 12.0–15.0)
O2 Saturation: 87 %
Potassium: 3 mmol/L — ABNORMAL LOW (ref 3.5–5.1)
Sodium: 142 mmol/L (ref 135–145)
TCO2: 24 mmol/L (ref 22–32)
pCO2, Ven: 43.2 mmHg — ABNORMAL LOW (ref 44.0–60.0)
pH, Ven: 7.337 (ref 7.250–7.430)
pO2, Ven: 56 mmHg — ABNORMAL HIGH (ref 32.0–45.0)

## 2020-12-02 LAB — SURGICAL PCR SCREEN
MRSA, PCR: NEGATIVE
Staphylococcus aureus: NEGATIVE

## 2020-12-02 LAB — PLATELET COUNT: Platelets: 53 10*3/uL — ABNORMAL LOW (ref 150–400)

## 2020-12-02 LAB — HEMOGLOBIN AND HEMATOCRIT, BLOOD
HCT: 23.1 % — ABNORMAL LOW (ref 36.0–46.0)
Hemoglobin: 7.9 g/dL — ABNORMAL LOW (ref 12.0–15.0)

## 2020-12-02 LAB — RESP PANEL BY RT-PCR (FLU A&B, COVID) ARPGX2
Influenza A by PCR: NEGATIVE
Influenza B by PCR: NEGATIVE
SARS Coronavirus 2 by RT PCR: NEGATIVE

## 2020-12-02 LAB — TROPONIN I (HIGH SENSITIVITY): Troponin I (High Sensitivity): 25 ng/L — ABNORMAL HIGH (ref <18)

## 2020-12-02 LAB — MAGNESIUM: Magnesium: 1.7 mg/dL (ref 1.7–2.4)

## 2020-12-02 LAB — PROTIME-INR
INR: 1.6 — ABNORMAL HIGH (ref 0.8–1.2)
Prothrombin Time: 18.2 seconds — ABNORMAL HIGH (ref 11.4–15.2)

## 2020-12-02 LAB — APTT
aPTT: 29 seconds (ref 24–36)
aPTT: 38 seconds — ABNORMAL HIGH (ref 24–36)

## 2020-12-02 LAB — ABO/RH: ABO/RH(D): O POS

## 2020-12-02 LAB — FIBRINOGEN: Fibrinogen: 195 mg/dL — ABNORMAL LOW (ref 210–475)

## 2020-12-02 SURGERY — REPAIR, AORTIC DISSECTION, ASCENDING
Anesthesia: General | Site: Chest

## 2020-12-02 MED ORDER — DEXTROSE 50 % IV SOLN: 0.0000 mL | INTRAVENOUS | Status: DC | PRN

## 2020-12-02 MED ORDER — SODIUM CHLORIDE 0.9% FLUSH
3.0000 mL | Freq: Two times a day (BID) | INTRAVENOUS | Status: DC
  Administered 2020-12-03 – 2020-12-08 (×10): 3 mL via INTRAVENOUS

## 2020-12-02 MED ORDER — ALBUMIN HUMAN 5 % IV SOLN
250.0000 mL | INTRAVENOUS | Status: AC | PRN
Start: 2020-12-02 — End: 2020-12-03
  Administered 2020-12-02 (×2): 12.5 g via INTRAVENOUS

## 2020-12-02 MED ORDER — VANCOMYCIN HCL 1250 MG/250ML IV SOLN
1250.0000 mg | INTRAVENOUS | Status: AC
  Administered 2020-12-02: 08:00:00 1250 mg via INTRAVENOUS
  Filled 2020-12-02: qty 250

## 2020-12-02 MED ORDER — LACTATED RINGERS IV SOLN: 500.0000 mL | Freq: Once | INTRAVENOUS | Status: DC | PRN

## 2020-12-02 MED ORDER — METOPROLOL TARTRATE 5 MG/5ML IV SOLN
2.5000 mg | INTRAVENOUS | Status: DC | PRN
  Administered 2020-12-02: 18:00:00 5 mg via INTRAVENOUS
  Administered 2020-12-02: 16:00:00 2.5 mg via INTRAVENOUS
  Administered 2020-12-04 – 2020-12-06 (×3): 5 mg via INTRAVENOUS
  Filled 2020-12-02 (×6): qty 5

## 2020-12-02 MED ORDER — MILRINONE LACTATE IN DEXTROSE 20-5 MG/100ML-% IV SOLN: 0.2500 ug/kg/min | INTRAVENOUS | Status: DC

## 2020-12-02 MED ORDER — METOPROLOL TARTRATE 25 MG/10 ML ORAL SUSPENSION
12.5000 mg | Freq: Two times a day (BID) | ORAL | Status: DC
  Administered 2020-12-02: 12.5 mg
  Filled 2020-12-02: qty 5

## 2020-12-02 MED ORDER — MIDAZOLAM HCL 2 MG/2ML IJ SOLN
2.0000 mg | INTRAMUSCULAR | Status: DC | PRN
  Administered 2020-12-03: 06:00:00 2 mg via INTRAVENOUS
  Filled 2020-12-02: qty 2

## 2020-12-02 MED ORDER — MAGNESIUM SULFATE 4 GM/100ML IV SOLN
4.0000 g | Freq: Once | INTRAVENOUS | Status: DC
  Filled 2020-12-02: qty 100

## 2020-12-02 MED ORDER — FAMOTIDINE IN NACL 20-0.9 MG/50ML-% IV SOLN
20.0000 mg | Freq: Two times a day (BID) | INTRAVENOUS | Status: AC
  Administered 2020-12-02 (×2): 20 mg via INTRAVENOUS
  Filled 2020-12-02 (×2): qty 50

## 2020-12-02 MED ORDER — MORPHINE SULFATE (PF) 2 MG/ML IV SOLN
1.0000 mg | INTRAVENOUS | Status: DC | PRN
  Administered 2020-12-03 – 2020-12-05 (×2): 2 mg via INTRAVENOUS
  Filled 2020-12-02 (×2): qty 1

## 2020-12-02 MED ORDER — PHENYLEPHRINE HCL-NACL 20-0.9 MG/250ML-% IV SOLN
30.0000 ug/min | INTRAVENOUS | Status: DC
  Filled 2020-12-02: qty 250

## 2020-12-02 MED ORDER — SODIUM CHLORIDE 0.9% IV SOLUTION: Freq: Once | INTRAVENOUS | Status: DC

## 2020-12-02 MED ORDER — ACETAMINOPHEN 500 MG PO TABS
1000.0000 mg | ORAL_TABLET | Freq: Four times a day (QID) | ORAL | Status: AC
  Administered 2020-12-03 – 2020-12-07 (×14): 1000 mg via ORAL
  Filled 2020-12-02 (×15): qty 2

## 2020-12-02 MED ORDER — BISACODYL 5 MG PO TBEC
10.0000 mg | DELAYED_RELEASE_TABLET | Freq: Every day | ORAL | Status: DC
  Administered 2020-12-04 – 2020-12-08 (×3): 10 mg via ORAL
  Filled 2020-12-02 (×6): qty 2

## 2020-12-02 MED ORDER — GABAPENTIN 300 MG PO CAPS: 300.0000 mg | ORAL_CAPSULE | Freq: Two times a day (BID) | ORAL | Status: DC

## 2020-12-02 MED ORDER — SODIUM CHLORIDE 0.9 % IV SOLN
1.5000 g | INTRAVENOUS | Status: AC
  Administered 2020-12-02: 09:00:00 1.5 g via INTRAVENOUS
  Filled 2020-12-02: qty 1.5

## 2020-12-02 MED ORDER — SODIUM CHLORIDE 0.9 % IV SOLN: INTRAVENOUS | Status: DC

## 2020-12-02 MED ORDER — ETOMIDATE 2 MG/ML IV SOLN
INTRAVENOUS | Status: DC | PRN
  Administered 2020-12-02: 6 mg via INTRAVENOUS
  Administered 2020-12-02: 14 mg via INTRAVENOUS

## 2020-12-02 MED ORDER — INSULIN REGULAR(HUMAN) IN NACL 100-0.9 UT/100ML-% IV SOLN: INTRAVENOUS | Status: DC

## 2020-12-02 MED ORDER — PHENYLEPHRINE HCL-NACL 20-0.9 MG/250ML-% IV SOLN: 0.0000 ug/min | INTRAVENOUS | Status: DC

## 2020-12-02 MED ORDER — FENTANYL CITRATE (PF) 250 MCG/5ML IJ SOLN
INTRAMUSCULAR | Status: AC
  Filled 2020-12-02: qty 10

## 2020-12-02 MED ORDER — LACTATED RINGERS IV SOLN: INTRAVENOUS | Status: DC

## 2020-12-02 MED ORDER — CHLORHEXIDINE GLUCONATE 0.12% ORAL RINSE (MEDLINE KIT)
15.0000 mL | Freq: Two times a day (BID) | OROMUCOSAL | Status: DC
  Administered 2020-12-02 – 2020-12-03 (×2): 15 mL via OROMUCOSAL

## 2020-12-02 MED ORDER — PROPOFOL 10 MG/ML IV BOLUS
INTRAVENOUS | Status: DC | PRN
  Administered 2020-12-02: 70 mg via INTRAVENOUS

## 2020-12-02 MED ORDER — TRANEXAMIC ACID 1000 MG/10ML IV SOLN
1.5000 mg/kg/h | INTRAVENOUS | Status: AC
  Administered 2020-12-02: 10:00:00 1.5 mg/kg/h via INTRAVENOUS
  Filled 2020-12-02: qty 25

## 2020-12-02 MED ORDER — POTASSIUM CHLORIDE 10 MEQ/50ML IV SOLN
10.0000 meq | INTRAVENOUS | Status: AC
  Administered 2020-12-02 (×3): 10 meq via INTRAVENOUS

## 2020-12-02 MED ORDER — ACETAMINOPHEN 650 MG RE SUPP
650.0000 mg | Freq: Once | RECTAL | Status: AC
  Administered 2020-12-02: 16:00:00 650 mg via RECTAL

## 2020-12-02 MED ORDER — SODIUM CHLORIDE 0.45 % IV SOLN: INTRAVENOUS | Status: DC | PRN

## 2020-12-02 MED ORDER — CHLORHEXIDINE GLUCONATE 0.12 % MT SOLN
15.0000 mL | OROMUCOSAL | Status: AC
  Administered 2020-12-02: 16:00:00 15 mL via OROMUCOSAL

## 2020-12-02 MED ORDER — CHLORHEXIDINE GLUCONATE CLOTH 2 % EX PADS
6.0000 | MEDICATED_PAD | Freq: Every day | CUTANEOUS | Status: DC
  Administered 2020-12-02 – 2020-12-10 (×7): 6 via TOPICAL

## 2020-12-02 MED ORDER — MILRINONE LACTATE IN DEXTROSE 20-5 MG/100ML-% IV SOLN
0.3000 ug/kg/min | INTRAVENOUS | Status: AC
  Administered 2020-12-02: 14:00:00 .125 ug/kg/min via INTRAVENOUS
  Filled 2020-12-02: qty 100

## 2020-12-02 MED ORDER — MANNITOL 20 % IV SOLN
Freq: Once | INTRAVENOUS | Status: DC
  Filled 2020-12-02: qty 13

## 2020-12-02 MED ORDER — LACTATED RINGERS IV SOLN: INTRAVENOUS | Status: DC | PRN

## 2020-12-02 MED ORDER — NICARDIPINE HCL IN NACL 40-0.83 MG/200ML-% IV SOLN
3.0000 mg/h | INTRAVENOUS | Status: DC
  Administered 2020-12-02: 17:00:00 15 mg/h via INTRAVENOUS
  Administered 2020-12-02: 22:00:00 9 mg/h via INTRAVENOUS
  Filled 2020-12-02 (×3): qty 200

## 2020-12-02 MED ORDER — ORAL CARE MOUTH RINSE
15.0000 mL | OROMUCOSAL | Status: DC
  Administered 2020-12-02 – 2020-12-03 (×7): 15 mL via OROMUCOSAL

## 2020-12-02 MED ORDER — 0.9 % SODIUM CHLORIDE (POUR BTL) OPTIME
TOPICAL | Status: DC | PRN
  Administered 2020-12-02: 09:00:00 5000 mL

## 2020-12-02 MED ORDER — BISACODYL 10 MG RE SUPP
10.0000 mg | Freq: Every day | RECTAL | Status: DC
  Filled 2020-12-02: qty 1

## 2020-12-02 MED ORDER — ACETAMINOPHEN 160 MG/5ML PO SOLN
1000.0000 mg | Freq: Four times a day (QID) | ORAL | Status: AC
  Administered 2020-12-02 – 2020-12-03 (×2): 1000 mg
  Filled 2020-12-02: qty 40.6

## 2020-12-02 MED ORDER — SODIUM CHLORIDE 0.9% FLUSH
10.0000 mL | Freq: Two times a day (BID) | INTRAVENOUS | Status: DC
  Administered 2020-12-02 – 2020-12-12 (×13): 10 mL

## 2020-12-02 MED ORDER — VANCOMYCIN HCL IN DEXTROSE 1-5 GM/200ML-% IV SOLN
1000.0000 mg | Freq: Once | INTRAVENOUS | Status: AC
  Administered 2020-12-02: 21:00:00 1000 mg via INTRAVENOUS
  Filled 2020-12-02: qty 200

## 2020-12-02 MED ORDER — MIDAZOLAM HCL 5 MG/5ML IJ SOLN
INTRAMUSCULAR | Status: DC | PRN
  Administered 2020-12-02 (×2): 3 mg via INTRAVENOUS
  Administered 2020-12-02: 2 mg via INTRAVENOUS

## 2020-12-02 MED ORDER — SODIUM CHLORIDE 0.9 % IV BOLUS (SEPSIS)
1000.0000 mL | Freq: Once | INTRAVENOUS | Status: AC
  Administered 2020-12-02: 06:00:00 1000 mL via INTRAVENOUS

## 2020-12-02 MED ORDER — MUPIROCIN 2 % EX OINT
1.0000 "application " | TOPICAL_OINTMENT | Freq: Two times a day (BID) | CUTANEOUS | Status: DC
  Administered 2020-12-02: 1 via NASAL
  Filled 2020-12-02: qty 22

## 2020-12-02 MED ORDER — ASPIRIN EC 325 MG PO TBEC
325.0000 mg | DELAYED_RELEASE_TABLET | Freq: Every day | ORAL | Status: DC
  Administered 2020-12-03 – 2020-12-12 (×9): 325 mg via ORAL
  Filled 2020-12-02 (×9): qty 1

## 2020-12-02 MED ORDER — SODIUM CHLORIDE 0.9 % IV SOLN
INTRAVENOUS | Status: DC
  Filled 2020-12-02: qty 30

## 2020-12-02 MED ORDER — ASPIRIN 81 MG PO CHEW
324.0000 mg | CHEWABLE_TABLET | Freq: Every day | ORAL | Status: DC
  Administered 2020-12-07: 09:00:00 324 mg
  Filled 2020-12-02 (×3): qty 4

## 2020-12-02 MED ORDER — MAGNESIUM SULFATE 50 % IJ SOLN
40.0000 meq | INTRAMUSCULAR | Status: DC
  Filled 2020-12-02: qty 9.85

## 2020-12-02 MED ORDER — FENTANYL CITRATE (PF) 100 MCG/2ML IJ SOLN
INTRAMUSCULAR | Status: DC | PRN
  Administered 2020-12-02 (×9): 100 ug via INTRAVENOUS

## 2020-12-02 MED ORDER — MIDAZOLAM HCL (PF) 10 MG/2ML IJ SOLN
INTRAMUSCULAR | Status: AC
  Filled 2020-12-02: qty 2

## 2020-12-02 MED ORDER — OXYCODONE HCL 5 MG PO TABS
5.0000 mg | ORAL_TABLET | ORAL | Status: DC | PRN
  Administered 2020-12-03 – 2020-12-06 (×3): 5 mg via ORAL
  Administered 2020-12-08 – 2020-12-10 (×3): 10 mg via ORAL
  Filled 2020-12-02: qty 1
  Filled 2020-12-02: qty 2
  Filled 2020-12-02: qty 1
  Filled 2020-12-02 (×2): qty 2
  Filled 2020-12-02: qty 1
  Filled 2020-12-02: qty 2

## 2020-12-02 MED ORDER — SODIUM CHLORIDE 0.9 % IV SOLN
750.0000 mg | INTRAVENOUS | Status: AC
  Administered 2020-12-02: 14:00:00 750 mg via INTRAVENOUS
  Filled 2020-12-02: qty 750

## 2020-12-02 MED ORDER — NOREPINEPHRINE 4 MG/250ML-% IV SOLN
0.0000 ug/min | INTRAVENOUS | Status: DC
  Filled 2020-12-02: qty 250

## 2020-12-02 MED ORDER — IOHEXOL 350 MG/ML SOLN
80.0000 mL | Freq: Once | INTRAVENOUS | Status: AC | PRN
  Administered 2020-12-02: 06:00:00 80 mL via INTRAVENOUS

## 2020-12-02 MED ORDER — DOCUSATE SODIUM 100 MG PO CAPS
200.0000 mg | ORAL_CAPSULE | Freq: Every day | ORAL | Status: DC
  Administered 2020-12-04 – 2020-12-10 (×4): 200 mg via ORAL
  Filled 2020-12-02 (×7): qty 2

## 2020-12-02 MED ORDER — LIDOCAINE 2% (20 MG/ML) 5 ML SYRINGE
INTRAMUSCULAR | Status: DC | PRN
  Administered 2020-12-02: 40 mg via INTRAVENOUS

## 2020-12-02 MED ORDER — ROCURONIUM BROMIDE 10 MG/ML (PF) SYRINGE
PREFILLED_SYRINGE | INTRAVENOUS | Status: DC | PRN
  Administered 2020-12-02: 100 mg via INTRAVENOUS
  Administered 2020-12-02 (×3): 50 mg via INTRAVENOUS

## 2020-12-02 MED ORDER — NICARDIPINE HCL IN NACL 20-0.86 MG/200ML-% IV SOLN
3.0000 mg/h | INTRAVENOUS | Status: DC
  Administered 2020-12-02: 16:00:00 5 mg/h via INTRAVENOUS
  Filled 2020-12-02 (×2): qty 200

## 2020-12-02 MED ORDER — ATORVASTATIN CALCIUM 10 MG PO TABS
10.0000 mg | ORAL_TABLET | Freq: Every day | ORAL | Status: DC
  Administered 2020-12-03 – 2020-12-12 (×10): 10 mg via ORAL
  Filled 2020-12-02 (×10): qty 1

## 2020-12-02 MED ORDER — INSULIN REGULAR(HUMAN) IN NACL 100-0.9 UT/100ML-% IV SOLN
INTRAVENOUS | Status: AC
  Administered 2020-12-02: 09:00:00 .6 [IU]/h via INTRAVENOUS
  Filled 2020-12-02: qty 100

## 2020-12-02 MED ORDER — METOCLOPRAMIDE HCL 5 MG/ML IJ SOLN
10.0000 mg | Freq: Four times a day (QID) | INTRAMUSCULAR | Status: DC
  Administered 2020-12-02 – 2020-12-03 (×3): 10 mg via INTRAVENOUS
  Filled 2020-12-02 (×2): qty 2

## 2020-12-02 MED ORDER — NITROGLYCERIN IN D5W 200-5 MCG/ML-% IV SOLN
2.0000 ug/min | INTRAVENOUS | Status: AC
  Administered 2020-12-02: 09:00:00 16.6 ug/min via INTRAVENOUS
  Filled 2020-12-02: qty 250

## 2020-12-02 MED ORDER — TRAMADOL HCL 50 MG PO TABS
50.0000 mg | ORAL_TABLET | ORAL | Status: DC | PRN
  Administered 2020-12-03 – 2020-12-08 (×4): 50 mg via ORAL
  Filled 2020-12-02 (×4): qty 1

## 2020-12-02 MED ORDER — FENTANYL CITRATE (PF) 100 MCG/2ML IJ SOLN
100.0000 ug | Freq: Once | INTRAMUSCULAR | Status: AC
  Administered 2020-12-02: 06:00:00 100 ug via INTRAVENOUS
  Filled 2020-12-02: qty 2

## 2020-12-02 MED ORDER — PROTAMINE SULFATE 10 MG/ML IV SOLN
INTRAVENOUS | Status: DC | PRN
  Administered 2020-12-02 (×2): 75 mg via INTRAVENOUS

## 2020-12-02 MED ORDER — DEXMEDETOMIDINE HCL IN NACL 400 MCG/100ML IV SOLN: 0.0000 ug/kg/h | INTRAVENOUS | Status: DC

## 2020-12-02 MED ORDER — SERTRALINE HCL 50 MG PO TABS: 50.0000 mg | ORAL_TABLET | Freq: Every day | ORAL | Status: DC

## 2020-12-02 MED ORDER — TRANEXAMIC ACID (OHS) BOLUS VIA INFUSION
15.0000 mg/kg | INTRAVENOUS | Status: AC
  Administered 2020-12-02: 09:00:00 850.5 mg via INTRAVENOUS
  Filled 2020-12-02: qty 851

## 2020-12-02 MED ORDER — PANTOPRAZOLE SODIUM 40 MG PO TBEC
40.0000 mg | DELAYED_RELEASE_TABLET | Freq: Every day | ORAL | Status: DC
  Administered 2020-12-04 – 2020-12-12 (×9): 40 mg via ORAL
  Filled 2020-12-02 (×9): qty 1

## 2020-12-02 MED ORDER — METOPROLOL TARTRATE 12.5 MG HALF TABLET
12.5000 mg | ORAL_TABLET | Freq: Two times a day (BID) | ORAL | Status: DC
  Administered 2020-12-03: 13:00:00 12.5 mg via ORAL
  Filled 2020-12-02: qty 1

## 2020-12-02 MED ORDER — HEMOSTATIC AGENTS (NO CHARGE) OPTIME
TOPICAL | Status: DC | PRN
  Administered 2020-12-02 (×3): 1 via TOPICAL

## 2020-12-02 MED ORDER — SODIUM CHLORIDE 0.9% FLUSH: 10.0000 mL | INTRAVENOUS | Status: DC | PRN

## 2020-12-02 MED ORDER — EPINEPHRINE HCL 5 MG/250ML IV SOLN IN NS
0.0000 ug/min | INTRAVENOUS | Status: DC
  Filled 2020-12-02: qty 250

## 2020-12-02 MED ORDER — TRANEXAMIC ACID (OHS) PUMP PRIME SOLUTION
2.0000 mg/kg | INTRAVENOUS | Status: DC
  Filled 2020-12-02: qty 1.13

## 2020-12-02 MED ORDER — PROTAMINE SULFATE 10 MG/ML IV SOLN
INTRAVENOUS | Status: AC
  Filled 2020-12-02: qty 25

## 2020-12-02 MED ORDER — ONDANSETRON HCL 4 MG/2ML IJ SOLN: 4.0000 mg | Freq: Four times a day (QID) | INTRAMUSCULAR | Status: DC | PRN

## 2020-12-02 MED ORDER — HEPARIN SODIUM (PORCINE) 1000 UNIT/ML IJ SOLN
INTRAMUSCULAR | Status: DC | PRN
  Administered 2020-12-02: 13000 [IU] via INTRAVENOUS
  Administered 2020-12-02: 5000 [IU] via INTRAVENOUS

## 2020-12-02 MED ORDER — SODIUM CHLORIDE 0.9 % IV SOLN
1.5000 g | Freq: Two times a day (BID) | INTRAVENOUS | Status: AC
  Administered 2020-12-02 – 2020-12-04 (×4): 1.5 g via INTRAVENOUS
  Filled 2020-12-02 (×3): qty 1.5

## 2020-12-02 MED ORDER — PROPOFOL 10 MG/ML IV BOLUS
INTRAVENOUS | Status: AC
  Filled 2020-12-02: qty 20

## 2020-12-02 MED ORDER — NITROGLYCERIN IN D5W 200-5 MCG/ML-% IV SOLN: 0.0000 ug/min | INTRAVENOUS | Status: DC

## 2020-12-02 MED ORDER — ACETAMINOPHEN 160 MG/5ML PO SOLN: 650.0000 mg | Freq: Once | ORAL | Status: AC

## 2020-12-02 MED ORDER — SODIUM CHLORIDE 0.9 % IV SOLN: 250.0000 mL | INTRAVENOUS | Status: DC

## 2020-12-02 MED ORDER — PLASMA-LYTE 148 IV SOLN
INTRAVENOUS | Status: DC | PRN
  Administered 2020-12-02: 09:00:00 500 mL

## 2020-12-02 MED ORDER — DEXMEDETOMIDINE HCL IN NACL 400 MCG/100ML IV SOLN
0.1000 ug/kg/h | INTRAVENOUS | Status: AC
  Administered 2020-12-02: 09:00:00 .3 ug/kg/h via INTRAVENOUS
  Filled 2020-12-02: qty 100

## 2020-12-02 MED ORDER — SODIUM CHLORIDE 0.9% FLUSH: 3.0000 mL | INTRAVENOUS | Status: DC | PRN

## 2020-12-02 MED ORDER — POTASSIUM CHLORIDE 2 MEQ/ML IV SOLN
80.0000 meq | INTRAVENOUS | Status: DC
  Filled 2020-12-02: qty 40

## 2020-12-02 MED ORDER — PLASMA-LYTE 148 IV SOLN
INTRAVENOUS | Status: DC
  Filled 2020-12-02: qty 2.5

## 2020-12-02 SURGICAL SUPPLY — 106 items
ADAPTER CARDIO PERF ANTE/RETRO (ADAPTER) ×3 IMPLANT
APPLICATOR TIP COSEAL (VASCULAR PRODUCTS) IMPLANT
ATTRACTOMAT 16X20 MAGNETIC DRP (DRAPES) IMPLANT
BAG DECANTER FOR FLEXI CONT (MISCELLANEOUS) ×3 IMPLANT
BLADE CLIPPER SURG (BLADE) IMPLANT
BLADE STERNUM SYSTEM 6 (BLADE) ×3 IMPLANT
BLADE SURG 11 STRL SS (BLADE) ×3 IMPLANT
BLADE SURG 15 STRL LF DISP TIS (BLADE) ×2 IMPLANT
BLADE SURG 15 STRL SS (BLADE) ×1
CANISTER SUCT 3000ML PPV (MISCELLANEOUS) ×3 IMPLANT
CANNULA AORTIC ROOT 9FR (CANNULA) ×3 IMPLANT
CANNULA GUNDRY RCSP 15FR (MISCELLANEOUS) ×6 IMPLANT
CANNULA MC2 2 STG 29/37 NON-V (CANNULA) ×2 IMPLANT
CANNULA MC2 TWO STAGE (CANNULA) ×1
CANNULA NON VENT 20FR 12 (CANNULA) ×3 IMPLANT
CANNULA SUMP PERICARDIAL (CANNULA) ×6 IMPLANT
CATH HEART VENT LEFT (CATHETERS) ×2 IMPLANT
CATH ROBINSON RED A/P 18FR (CATHETERS) ×9 IMPLANT
CATH THORACIC 28FR RT ANG (CATHETERS) IMPLANT
CATH THORACIC 36FR (CATHETERS) IMPLANT
CLIP VESOCCLUDE LG 6/CT (CLIP) ×6 IMPLANT
CNTNR URN SCR LID CUP LEK RST (MISCELLANEOUS) ×2 IMPLANT
CONNECTOR BLAKE 2:1 CARIO BLK (MISCELLANEOUS) ×3 IMPLANT
CONT SPEC 4OZ STRL OR WHT (MISCELLANEOUS) ×1
COVER SURGICAL LIGHT HANDLE (MISCELLANEOUS) IMPLANT
DRAPE SLUSH/WARMER DISC (DRAPES) ×3 IMPLANT
DRSG AQUACEL AG ADV 3.5X 6 (GAUZE/BANDAGES/DRESSINGS) ×6 IMPLANT
DRSG COVADERM 4X14 (GAUZE/BANDAGES/DRESSINGS) ×3 IMPLANT
ELECT BLADE 4.0 EZ CLEAN MEGAD (MISCELLANEOUS) ×3
ELECT CAUTERY BLADE 6.4 (BLADE) ×3 IMPLANT
ELECT REM PT RETURN 9FT ADLT (ELECTROSURGICAL) ×6
ELECTRODE BLDE 4.0 EZ CLN MEGD (MISCELLANEOUS) ×2 IMPLANT
ELECTRODE REM PT RTRN 9FT ADLT (ELECTROSURGICAL) ×4 IMPLANT
FELT TEFLON 1X6 (MISCELLANEOUS) ×6 IMPLANT
FIBERTAPE STERNAL CLSR 2X36 (SUTURE) ×12 IMPLANT
GAUZE SPONGE 4X4 12PLY STRL (GAUZE/BANDAGES/DRESSINGS) ×3 IMPLANT
GAUZE SPONGE 4X4 12PLY STRL LF (GAUZE/BANDAGES/DRESSINGS) ×3 IMPLANT
GLOVE BIO SURGEON STRL SZ 6 (GLOVE) IMPLANT
GLOVE BIO SURGEON STRL SZ 6.5 (GLOVE) ×15 IMPLANT
GLOVE BIO SURGEON STRL SZ7 (GLOVE) IMPLANT
GLOVE BIO SURGEON STRL SZ7.5 (GLOVE) ×12 IMPLANT
GLOVE BIO SURGEON STRL SZ8 (GLOVE) ×6 IMPLANT
GLOVE BIOGEL PI IND STRL 7.5 (GLOVE) ×2 IMPLANT
GLOVE BIOGEL PI INDICATOR 7.5 (GLOVE) ×1
GLOVE EUDERMIC 7 POWDERFREE (GLOVE) ×6 IMPLANT
GLOVE SURG SS PI 6.5 STRL IVOR (GLOVE) ×9 IMPLANT
GLOVE SURG UNDER POLY LF SZ6 (GLOVE) ×6 IMPLANT
GOWN STRL REUS W/ TWL LRG LVL3 (GOWN DISPOSABLE) ×18 IMPLANT
GOWN STRL REUS W/ TWL XL LVL3 (GOWN DISPOSABLE) ×10 IMPLANT
GOWN STRL REUS W/TWL LRG LVL3 (GOWN DISPOSABLE) ×9
GOWN STRL REUS W/TWL XL LVL3 (GOWN DISPOSABLE) ×5
GRAFT CV 30X8WVN NDL (Graft) ×2 IMPLANT
GRAFT HEMASHIELD 8MM (Graft) ×1 IMPLANT
GRAFT WOVEN D/V 28DX30L (Vascular Products) ×3 IMPLANT
HEMOSTAT POWDER SURGIFOAM 1G (HEMOSTASIS) ×9 IMPLANT
HEMOSTAT SURGICEL 2X14 (HEMOSTASIS) ×3 IMPLANT
INSERT FOGARTY XLG (MISCELLANEOUS) ×3 IMPLANT
INSERT SUTURE HOLDER (MISCELLANEOUS) ×3 IMPLANT
KIT BASIN OR (CUSTOM PROCEDURE TRAY) ×3 IMPLANT
KIT CATH CPB BARTLE (MISCELLANEOUS) ×3 IMPLANT
KIT DRAINAGE VACCUM ASSIST (KITS) ×3 IMPLANT
KIT SUCTION CATH 14FR (SUCTIONS) ×3 IMPLANT
KIT TURNOVER KIT B (KITS) ×3 IMPLANT
LINE VENT (MISCELLANEOUS) ×3 IMPLANT
NDL SUT PASSING CERCLAGE MED (SUTURE) ×3
NEEDLE SUT PASSING CERCLAG MED (SUTURE) ×2 IMPLANT
NS IRRIG 1000ML POUR BTL (IV SOLUTION) ×15 IMPLANT
PACK E OPEN HEART (SUTURE) ×3 IMPLANT
PACK OPEN HEART (CUSTOM PROCEDURE TRAY) ×3 IMPLANT
PAD ARMBOARD 7.5X6 YLW CONV (MISCELLANEOUS) ×6 IMPLANT
POSITIONER HEAD DONUT 9IN (MISCELLANEOUS) ×3 IMPLANT
SEALANT SURG COSEAL 8ML (VASCULAR PRODUCTS) IMPLANT
SET CARDIOPLEGIA MPS 5001102 (MISCELLANEOUS) ×3 IMPLANT
SUT BONE WAX W31G (SUTURE) ×3 IMPLANT
SUT EB EXC GRN/WHT 2-0 V-5 (SUTURE) IMPLANT
SUT ETHIBON EXCEL 2-0 V-5 (SUTURE) IMPLANT
SUT ETHIBOND 2 0 SH (SUTURE) ×1
SUT ETHIBOND 2 0 SH 36X2 (SUTURE) ×2 IMPLANT
SUT ETHIBOND V-5 VALVE (SUTURE) IMPLANT
SUT MNCRL AB 4-0 PS2 18 (SUTURE) ×3 IMPLANT
SUT PROLENE 3 0 SH 1 (SUTURE) ×3 IMPLANT
SUT PROLENE 3 0 SH DA (SUTURE) IMPLANT
SUT PROLENE 4 0 RB 1 (SUTURE) ×5
SUT PROLENE 4 0 SH DA (SUTURE) ×81 IMPLANT
SUT PROLENE 4-0 RB1 .5 CRCL 36 (SUTURE) ×10 IMPLANT
SUT PROLENE 5 0 C 1 36 (SUTURE) ×6 IMPLANT
SUT PROLENE 5 0 RB 2 (SUTURE) IMPLANT
SUT SILK  1 MH (SUTURE) ×1
SUT SILK 1 MH (SUTURE) ×2 IMPLANT
SUT STEEL 6MS V (SUTURE) ×3 IMPLANT
SUT VIC AB 1 CTX 36 (SUTURE) ×2
SUT VIC AB 1 CTX36XBRD ANBCTR (SUTURE) ×4 IMPLANT
SUT VIC AB 2-0 CT1 27 (SUTURE) ×1
SUT VIC AB 2-0 CT1 TAPERPNT 27 (SUTURE) ×2 IMPLANT
SUT VIC AB 3-0 X1 27 (SUTURE) IMPLANT
SYSTEM SAHARA CHEST DRAIN ATS (WOUND CARE) ×3 IMPLANT
TAPE CLOTH SURG 4X10 WHT LF (GAUZE/BANDAGES/DRESSINGS) ×3 IMPLANT
TOWEL GREEN STERILE (TOWEL DISPOSABLE) ×3 IMPLANT
TOWEL GREEN STERILE FF (TOWEL DISPOSABLE) IMPLANT
TRAY FOLEY SLVR 14FR TEMP STAT (SET/KITS/TRAYS/PACK) ×3 IMPLANT
TUBE CONNECTING 12X1/4 (SUCTIONS) ×3 IMPLANT
TUBE SUCT INTRACARD DLP 20F (MISCELLANEOUS) ×3 IMPLANT
UNDERPAD 30X36 HEAVY ABSORB (UNDERPADS AND DIAPERS) ×3 IMPLANT
VENT LEFT HEART 12002 (CATHETERS) ×3
WATER STERILE IRR 1000ML POUR (IV SOLUTION) ×6 IMPLANT
YANKAUER SUCT BULB TIP NO VENT (SUCTIONS) ×3 IMPLANT

## 2020-12-02 NOTE — Progress Notes (Signed)
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 (N/A) TRANSESOPHAGEAL ECHOCARDIOGRAM (TEE)  Total Length of Stay:  LOS: 0 days  BP (!) 189/101   Pulse (!) 56   Temp 98.2 F (36.8 C) (Oral)   Resp 19   Ht 5\' 8"  (1.727 m)   Wt 56.7 kg   SpO2 96%   BMI 19.01 kg/m   .Intake/Output      12/20 0701 12/21 0700 12/21 0701 12/22 0700   I.V. (mL/kg)  1500 (26.5)   Blood  2715   IV Piggyback  450   Total Intake(mL/kg)  4665 (82.3)   Urine (mL/kg/hr)  260 (0.5)   Total Output  260   Net  +4405          . sodium chloride    . [START ON 12/03/2020] sodium chloride    . sodium chloride    . albumin human    . cefUROXime (ZINACEF)  IV    . dexmedetomidine (PRECEDEX) IV infusion    . famotidine (PEPCID) IV    . insulin    . lactated ringers    . lactated ringers    . lactated ringers    . magnesium sulfate    . niCARDipine 5 mg/hr (12/02/20 1532)  . nitroGLYCERIN    . phenylephrine (NEO-SYNEPHRINE) Adult infusion    . potassium chloride    . vancomycin       Lab Results  Component Value Date   WBC 14.5 (H) 12/01/2020   HGB 9.2 (L) 12/02/2020   HCT 27.0 (L) 12/02/2020   PLT 53 (L) 12/02/2020   GLUCOSE 167 (H) 12/02/2020   CHOL 227 (H) 10/28/2017   TRIG 183 (H) 10/28/2017   HDL 49 10/28/2017   LDLCALC 141 (H) 10/28/2017   ALT 13 12/02/2020   AST 17 12/02/2020   NA 142 12/02/2020   K 3.6 12/02/2020   CL 106 12/02/2020   CREATININE 2.00 (H) 12/02/2020   BUN 35 (H) 12/02/2020   CO2 23 12/01/2020   TSH 0.95 09/08/2016   INR 1.2 12/01/2020   HGBA1C 5.3 01/27/2018   MICROALBUR 30.9 09/08/2016   early postop plts 53,000- 100 from chest tubes so far On Cardene and NTG for BP Cr 2.0     Grace Isaac MD  Beeper [phone] Office  [phone] 12/02/2020 3:42 PM

## 2020-12-02 NOTE — Anesthesia Procedure Notes (Signed)
Central Venous Catheter Insertion Performed by: Audry Pili, MD, anesthesiologist Start/End12/21/2021 8:39 AM, 12/02/2020 8:45 AM Patient location: Pre-op. Preanesthetic checklist: patient identified, IV checked, risks and benefits discussed, surgical consent, monitors and equipment checked, pre-op evaluation, timeout performed and anesthesia consent Position: Trendelenburg Lidocaine 1% used for infiltration and patient sedated Hand hygiene performed , maximum sterile barriers used  and Seldinger technique used Catheter size: 8.5 Fr Central line was placed.MAC introducer Procedure performed using ultrasound guided technique. Ultrasound Notes:anatomy identified, needle tip was noted to be adjacent to the nerve/plexus identified, no ultrasound evidence of intravascular and/or intraneural injection and image(s) printed for medical record Attempts: 1 Following insertion, line sutured, dressing applied and Biopatch. Post procedure assessment: blood return through all ports, no air and free fluid flow  Patient tolerated the procedure well with no immediate complications.

## 2020-12-02 NOTE — Transfer of Care (Signed)
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 (N/A Chest) TRANSESOPHAGEAL ECHOCARDIOGRAM (TEE)  Patient Location: ICU  Anesthesia Type:General  Level of Consciousness: unresponsive and Patient remains intubated per anesthesia plan  Airway & Oxygen Therapy: Patient remains intubated per anesthesia plan and Patient placed on Ventilator (see vital sign flow sheet for setting)  Post-op Assessment: Report given to RN and Post -op Vital signs reviewed and stable  Post vital signs: Reviewed and stable  Last Vitals:  Vitals Value Taken Time  BP    Temp    Pulse    Resp    SpO2      Last Pain:  Vitals:   12/02/20 0516  TempSrc: Oral  PainSc:          Complications: No complications documented.

## 2020-12-02 NOTE — Anesthesia Procedure Notes (Signed)
Arterial Line Insertion Start/End12/21/2021 8:20 AM, 12/02/2020 8:25 AM Performed by: Verdie Drown, CRNA, CRNA  Preanesthetic checklist: patient identified, IV checked, site marked, risks and benefits discussed, surgical consent, monitors and equipment checked, pre-op evaluation, timeout performed and anesthesia consent Lidocaine 1% used for infiltration and patient sedated Left, radial was placed Catheter size: 20 G Hand hygiene performed  and maximum sterile barriers used  Allen's test indicative of satisfactory collateral circulation Attempts: 1 Procedure performed without using ultrasound guided technique. Following insertion, dressing applied and Biopatch. Post procedure assessment: normal  Patient tolerated the procedure well with no immediate complications.

## 2020-12-02 NOTE — Anesthesia Procedure Notes (Signed)
Arterial Line Insertion Start/End12/21/2021 8:39 AM, 12/02/2020 8:39 AM Performed by: Verdie Drown, CRNA, CRNA  Preanesthetic checklist: patient identified, IV checked, site marked, risks and benefits discussed, surgical consent, monitors and equipment checked, pre-op evaluation, timeout performed and anesthesia consent Right, radial was placed Catheter size: 20 G Maximum sterile barriers used  Allen's test indicative of satisfactory collateral circulation Attempts: 1 Procedure performed without using ultrasound guided technique. Following insertion, dressing applied and Biopatch. Patient tolerated the procedure well with no immediate complications.

## 2020-12-02 NOTE — Op Note (Signed)
[address]       ,[place] [address]             [phone]                                           12/02/2020 Patient:  LUCRESHA DISMUKE Pre-Op Dx: Type A dissection   Post-op Dx:  Same Procedure: Right axillary artery cannulation with 8 mm graft Antegrade cerebral perfusion. Aortic valve resuspension Type A aortic dissection repair with 46mm graft Type A dissection repair with hemiarch.  Surgeon and Role:      * Soma Bachand, Lucile Crater, MD - Primary    * E. Barrett, PA-C - assisting Anesthesia  general EBL:  500 ml Blood Administration: 4 units of pRBC Circulatory arrest time: 52 min Xclamp Time:  114 min Pump Time:  139min  Drains: 36 F blake drain: L, mediastinal  Wires: none Counts: correct   Indications: 64 year old female with an acute type a dissection that extends from the mid a sending down to the bifurcation.  Right lower extremity shows signs of ischemia.  She will be taken to the operating theater for a type A dissection repair.  Vascular surgery is already on board, and we will check pulses at the end of our surgery to ensure that she does not need fenestration vs fem-fem bypass of her dissection flap for reperfusion of her right lower extremity.  Findings: Serous pericardial effusion.  Dissection flap originated right above the sinotubular junction.  The valve and the coronary ostium were uninvolved.      Operative Technique: All invasive lines were placed in pre-op holding.  After the risks, benefits and alternatives were thoroughly discussed, the patient was brought to the operative theatre.  Anesthesia was induced, and the patient was prepped and draped in normal sterile fashion.  An appropriate surgical pause was performed, and pre-operative antibiotics were dosed accordingly.  We began with an incision over the right deltopectoral groove.  This was carried down with a combination of Bovie cautery and blunt dissection until we reached  the right axillary artery.  Patient was then systemically heparinized, and the axillary artery was encircled and clamped.  An arteriotomy was created and an 8 mm graft was sewn to the axillary artery in an end-to-side fashion.  This would serve as our arterial cannulation site.  Next a sternotomy was created in standard fashion.  The pericardium was then divided in the midline and fashion into a cradle.  The right atrial appendage was then used as our venous cannulation site.  Cardiopulmonary bypass was then initiated and we began to cool the patient to 28 degrees.  The right superior pulmonary vein was used for LV vent.  In a retrograde cannula was placed in the coronary sinus.  We then began to mobilize the ascending aorta up to the innominate artery.  It was encircled with a umbilical tape.  Once we reached 28 degrees, the innominate artery was clamped and we began our circulatory arrest with antegrade cerebral perfusion.  The ascending aorta was then divided and there was a clear flap extending in a antergrade fashion.  The aortic valve was inspected.  It was uninvolved.  We began by mobilizing the dissection flap up to the innominate artery, with the plan of performing a hemiarch replacement.  An inner and outer felt strip  was used to reinforce the aorta and was sewn with a 4-0 Prolene.  We then sized the distal aorta to a 68mm graft.  The graft was anastomosed to the distal aorta and upon completion the graft was thoroughly de-aired and we removed our innominate artery clamp and resumed full cardiopulmonary bypass.  At this point we began to rewarm the patient.  We then focused our attention to the proximal aorta.  The dissection flap was trimmed down and the aortic valve was then resuspended with 4 -0 pledgeted suture.  Another felt strip was used to recreate the medium of the aorta between the the dissection flap.  On the proximal aorta was then sewn to the graft with 4-0 Prolene.  A DLP cannula was then  placed in the graft to assist with de-airing.  A reanimation dose of cardioplegia was then given, and after thoroughly de-airing the heart the cross-clamp was removed.  We checked our valve function, and for air using the TEE.  Once we were satisfying, we separated from cardiopulmonary bypass without event.    The heparin was reversed with protamine, and hemostasis was obtained.  Chest tubes and wires were placed, and the sternum was re-approximated with with sternal wires and shoe laces.  The soft tissue and skin were re-approximated wth absorbable suture.    The patient tolerated the procedure without any immediate complications, and was transferred to the ICU in guarded condition.  Loys Shugars Bary Leriche

## 2020-12-02 NOTE — Anesthesia Preprocedure Evaluation (Addendum)
Anesthesia Evaluation  Patient identified by MRN, date of birth, ID band Patient awake    Reviewed: Allergy & Precautions, NPO status , Patient's Chart, lab work & pertinent test results, Unable to perform ROS - Chart review onlyPreop documentation limited or incomplete due to emergent nature of procedure.  History of Anesthesia Complications Negative for: history of anesthetic complications  Airway Mallampati: II  TM Distance: >3 FB Neck ROM: Full    Dental   Pulmonary Current Smoker and Patient abstained from smoking.,    Pulmonary exam normal        Cardiovascular hypertension, Pt. on medications and Pt. on home beta blockers + Peripheral Vascular Disease   Rhythm:Regular Rate:Normal + Diastolic murmurs  '21 CT Angio Chest - 1. 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 2. Developing low-attenuation pericardial effusion. Consider echocardiography to exclude features of tamponade. 3. Dissection flap extends into the aortic arch and branch vessels involving the shared origin of the brachiocephalic and left common carotid arteries and the proximal left subclavian artery. Abrupt tapering of the right internal carotid artery arising from what appears to be the false lumen. The right subclavian artery appears largely supplied by the true lumen though somewhat attenuated possibly secondary to luminal narrowing or developing thrombus. Remaining proximal great vessels are more normally opacified. 4. Extension of the dissection flap through the abdominal aorta into the left inflow and proximal outflow vessels, terminating at the level of the left common femoral artery. 5. Dissection flap propagates into the proximal SMA though more distal branches appear normally opacified. 6. Left renal artery arises from the false lumen with infarct of the majority of the left renal parenchyma only a small portion  of the upper pole spared by a tiny accessory renal artery. 7. Attenuation of the IMA origin, distal branch opacification likely supplied via collateral. 8. Abrupt tapering within the proximal right common iliac artery and attenuated opacification of the more distal inflow and proximal outflow vessels. Nonvascular: 1. Trace bilateral effusions. Bibasilar atelectasis and left basilar scarring. 2. Cholelithiasis without evidence of acute cholecystitis. 3. Prior hysterectomy. 4. Indeterminate 9 mm hyperdense focus along the over lower pole of the left kidney. Could reflect a hyperdense cyst versus solid lesion. Recommend further evaluation with nonemergent renal ultrasound on a nonemergent basis.    Neuro/Psych PSYCHIATRIC DISORDERS Anxiety Depression    GI/Hepatic (+) Hepatitis -, C  Endo/Other  diabetes, Type 2 Hypokalemia   Renal/GU Renal InsufficiencyRenal disease     Musculoskeletal  (+) Arthritis ,   Abdominal   Peds  Hematology  (+) anemia ,   Anesthesia Other Findings   Reproductive/Obstetrics                            Anesthesia Physical Anesthesia Plan  ASA: V and emergent  Anesthesia Plan: General   Post-op Pain Management:    Induction: Intravenous  PONV Risk Score and Plan: 3 and Treatment may vary due to age or medical condition  Airway Management Planned: Oral ETT  Additional Equipment: Arterial line, PA Cath, CVP, TEE and Ultrasound Guidance Line Placement  Intra-op Plan:   Post-operative Plan: Post-operative intubation/ventilation  Informed Consent: I have reviewed the patients History and Physical, chart, labs and discussed the procedure including the risks, benefits and alternatives for the proposed anesthesia with the patient or authorized representative who has indicated his/her understanding and acceptance.     Only emergency  history available  Plan Discussed with: CRNA and Anesthesiologist  Anesthesia Plan  Comments:        Anesthesia Quick Evaluation

## 2020-12-02 NOTE — Brief Op Note (Signed)
12/01/2020 - 12/02/2020  9:27 AM  PATIENT:  Mary Stephens  64 y.o. female  PRE-OPERATIVE DIAGNOSIS:  TYPE A DISSECTION  POST-OPERATIVE DIAGNOSIS:  TYPE A DISSECTION  PROCEDURE:  Procedure(s) with comments:  REPAIR OF ACUTE ASCENDING THORACIC AORTIC DISSECTION VIA CIRC ARREST USING HEMASHIELD PLATINUM 28 MM VASCULAR GRAFT. (N/A) - AXILLARY CANNULATION AND CIRC ARREST TRANSESOPHAGEAL ECHOCARDIOGRAM (TEE)  SURGEON:  Surgeon(s) and Role:    * Lightfoot, Lucile Crater, MD - Primary  PHYSICIAN ASSISTANT: [name] PA-C  ANESTHESIA:   general   BLOOD ADMINISTERED: 2 units PRBC,  CELLSAVER, FFP and PLTS  DRAINS: Mediastinal Chest Drains   LOCAL MEDICATIONS USED:  NONE  SPECIMEN:  Source of Specimen:  Ascending Aorta  DISPOSITION OF SPECIMEN:  PATHOLOGY  COUNTS:  YES  TOURNIQUET:  * No tourniquets in log *  DICTATION: .Dragon Dictation  PLAN OF CARE: Admit to inpatient   PATIENT DISPOSITION:  ICU - intubated and hemodynamically stable.   Delay start of Pharmacological VTE agent (>24hrs) due to surgical blood loss or risk of bleeding: yes

## 2020-12-02 NOTE — Hospital Course (Addendum)
History of Present Illness:  Mary Stephens is a 64 yo AA female with known history of HTN, Diabetes, and Hyperlipidemia.  She presented to the Emergency Department on 11/27/2020 with complaints of chest pain with radiation to the left side of her neck and arm.  The pain was worse with deep inspiration and she was short of breath.  EKG was obtained which showed flipped T waves and trace ST depression.  Her troponin level was 40 and remained unchanged.  D-Dimer was also obtained which was elevated.  She was unable to have a CT scan due to elevated renal function.  Ultimately the patient left the hospital AMA for a second opinion.  She again presented to the ED this morning on 12/02/2020.  She presented with complaints of epigastric/chest pain.  The pain developed suddenly after the patient yelled at her friend.  The patient also developed an episode of lower extremity numbness and weakness in her right leg resulting in a fall.  Due to this the patient presented for evaluation.  CTA of the chest was obtained and revealed a Type A Aortic Dissection.  Cardiothoracic surgery was consulted for emergent surgical intervention.  Hospital Course:  Mary Stephens was taken emergently to the operating room.  She underwent Repair of Aortic Dissection and Hemi-arch replacement of the ascending aorta.  She tolerated the procedure without difficulty and was taken to the SICU in stable condition.  The patient was monitored by Vascular surgery.  She was found to have easily palpable PT pulses bilaterally.  The patient was weaned and extubated on POD #1.  She was weaned off Milrinone as hemodynamics allowed.  The patient's cardene was titrated for additional BP control.  Her creatinine level was up to 3.41.  Nephrology was consulted and they are aware of patient as they evaluated her previously.  They did not feel there was any need for urgent dialysis.  They recommended avoiding nephrotoxic agents.  They also felt she may benefit from  a renal artery duplex to evaluate flow to her left kidney.  The patient developed respiratory distress overnight.  She was treated without a non-rebreather.  Her ABG did not improved and she was placed on BIPAP.  The patient immediately removed it stating she couldn't do it.  The patient was placed on a high flow nasal cannula.  She was found to have a pleural effusion and it was recommended the patient undergo Thoracentesis.  Critical care consult was obtained for this.  She unfortunately refused to have the procedure performed.  She was treated Lasix for volume overload.  She remained short of breath.  CXR now showed development of a right sided pleural effusion.  We again felt Thoracentesis would be indicated.  This time the patient was agreeable to proceed.  This was done at bedside with successful removal of 600 cc of fluid.  Her oxygen requirement improved post procedure.  She remained hypertensive post operatively.  Her medications were adjusted and she was started on Clonidine additional support.  Unfortunately her elevated BP persisted despite Norvasc, Lopressor, and Clonidine.  She required several doses of Hydralazine for additional BP support.   Her CXR continued to show a left sided pleural effusion.  Thoracentesis was again recommended.  She was maintaining NSR and felt medically stable for transfer to the telemetry unit on 12/08/2020.

## 2020-12-02 NOTE — ED Provider Notes (Signed)
[facility] EMERGENCY DEPARTMENT Provider Note   CSN: [id number] Arrival date & time: 12/01/20  2142     History Chief Complaint  Patient presents with  . Chest Pain    Mary Stephens is a 64 y.o. female.  The history is provided by the patient.  Chest Pain Pain location:  Epigastric and L chest Pain quality: throbbing   Pain radiates to:  Upper back Pain severity:  Severe Timing:  Constant Progression:  Worsening Chronicity:  New Relieved by:  Nothing Worsened by:  Nothing Associated symptoms: abdominal pain, back pain, diaphoresis, nausea, numbness and weakness   Patient with history of diabetes and hypertension presents with epigastric/chest pain.  Patient reports she went outside to yell at a friend when she had sudden onset of epigastric/chest pain that radiated to her back.  She reports the pain was severe and worse than she has ever experienced.  She reports she then had an episode of numbness and weakness in the right leg only which caused her to fall.  Denies syncope.  Denies any traumatic injury.  She had difficulty standing due to the numbness and weakness.  She has never had this issue before.  Denies any history of any vascular surgery.  Denies any history of CAD/VTE. Denies any facial or arm weakness. She reports she has continued numbness in her right foot but the weakness is improving.     Past Medical History:  Diagnosis Date  . Anxiety   . Arthritis   . Colon polyps   . Depression   . Diabetes mellitus without complication ([place])   . Hepatitis C   . Hyperlipidemia   . Hypertension   . Neuropathy    feet    Patient Active Problem List   Diagnosis Date Noted  . Essential hypertension 05/14/2015  . Tobacco dependence 12/27/2014  . DM (diabetes mellitus), type 2 ([place]) 09/12/2014  . Chronic hepatitis C virus infection ([place]) 08/14/2014    Past Surgical History:  Procedure Laterality Date  . COLONOSCOPY  ?[id number]?   in  [place] (1st colon 20 polyps removed-per pt)  . PARTIAL HYSTERECTOMY  2001     OB History   No obstetric history on file.     Family History  Problem Relation Age of Onset  . Diabetes Mother   . Colon cancer Neg Hx   . Colon polyps Neg Hx   . Esophageal cancer Neg Hx   . Rectal cancer Neg Hx   . Stomach cancer Neg Hx     Social History   Tobacco Use  . Smoking status: Current Every Day Smoker    Packs/day: 0.25    Types: Cigarettes  . Smokeless tobacco: Never Used  . Tobacco comment: admits to cuttting back 4 cigarettes/day  Vaping Use  . Vaping Use: Never used  Substance Use Topics  . Alcohol use: Yes    Alcohol/week: 1.0 standard drink    Types: 1 Standard drinks or equivalent per week    Comment: wine cooler  . Drug use: Not Currently    Types: Marijuana    Home Medications Prior to Admission medications   Medication Sig Start Date End Date Taking? Authorizing Provider  acetaminophen (TYLENOL) 325 MG tablet Take 650 mg by mouth every 12 (twelve) hours as needed for moderate pain.    [provider]  amLODipine (NORVASC) 10 MG tablet Take 1 tablet (10 mg total) by mouth every morning. Patient taking differently: Take 10 mg by mouth daily.  01/27/18   Gildardo Pounds, NP  Artificial Saliva (DRY MOUTH SPRAY MT) Use as directed 1 spray in the mouth or throat 6 (six) times daily. Biotene    [provider]  atorvastatin (LIPITOR) 10 MG tablet Take 1 tablet (10 mg total) by mouth daily. 01/27/18   Gildardo Pounds, NP  calcium carbonate (OSCAL) 1500 (600 Ca) MG TABS tablet Take 600 mg of elemental calcium by mouth daily with breakfast.    [provider]  Capsaicin 0.1 % CREA Apply 1 application topically in the morning, at noon, and at bedtime.    [provider]  Cholecalciferol (VITAMIN D-3) 125 MCG (5000 UT) TABS Take 1 tablet by mouth daily.    [provider]  gabapentin (NEURONTIN) 300 MG capsule Take 300 mg by mouth 2  (two) times daily.    [provider]  Glucerna (GLUCERNA) LIQD Take 237 mLs by mouth 2 (two) times daily between meals.    [provider]  glucose blood test strip Check BS twice daily, 250.00 09/12/14   Lance Bosch, NP  glucose monitoring kit (FREESTYLE) monitoring kit 1 each by Does not apply route as needed for other. Please check BS twice daily before meals, 250.00 09/12/14   Lance Bosch, NP  Lancets (FREESTYLE) lancets Check BS twice daily before meals, 250.00 09/12/14   Chari Manning A, NP  melatonin 3 MG TABS tablet Take 3 mg by mouth at bedtime as needed (insomnia).    [provider]  metoprolol succinate (TOPROL-XL) 25 MG 24 hr tablet Take 25 mg by mouth daily.    [provider]  Na Sulfate-K Sulfate-Mg Sulf 17.5-3.13-1.6 GM/177ML SOLN Suprep (no substitutions)-TAKE AS DIRECTED. 11/24/20   Irene Shipper, MD  potassium chloride (K-DUR) 10 MEQ tablet Take 1 tablet (10 mEq total) by mouth daily. 01/28/18 06/20/29  Gildardo Pounds, NP  sertraline (ZOLOFT) 50 MG tablet Take 50 mg by mouth daily.    [provider]  glipiZIDE (GLUCOTROL XL) 5 MG 24 hr tablet Take 1 tablet (5 mg total) by mouth daily with breakfast. Patient not taking: No sig reported 01/27/18 06/20/20  Gildardo Pounds, NP    Allergies    Patient has no known allergies.  Review of Systems   Review of Systems  Constitutional: Positive for diaphoresis.  Cardiovascular: Positive for chest pain.  Gastrointestinal: Positive for abdominal pain and nausea.  Musculoskeletal: Positive for back pain.  Neurological: Positive for weakness and numbness.  All other systems reviewed and are negative.   Physical Exam Updated Vital Signs BP (!) 107/91 (BP Location: Right Arm)   Pulse (!) 56   Temp 98.2 F (36.8 C) (Oral)   Resp 20   Ht 1.727 m (5' 8" )   Wt 56.7 kg   SpO2 96%   BMI 19.01 kg/m   Physical Exam CONSTITUTIONAL: Thin appearing, uncomfortable HEAD:  Normocephalic/atraumatic EYES: EOMI/PERRL ENMT: Mucous membranes moist NECK: supple no meningeal signs SPINE/BACK:entire spine nontender CV: S1/S2 noted, murmur noted LUNGS: Lungs are clear to auscultation bilaterally, no apparent distress ABDOMEN: soft, mild epigastric tender, no rebound or guarding, bowel sounds noted throughout abdomen, no pulsatile mass GU:no cva tenderness NEURO: Pt is awake/alert/appropriate.  No facial droop.  No facial sensory deficit.  Equal handgrips noted.  No arm drift.  She has numbness throughout her right lower extremity.  4/5 strength with right foot plantar and dorsiflexion.  5/5 strength with left foot plantar and dorsiflexion.  No sensory  deficit his left foot EXTREMITIES: No deformities of full range of motion.  Equal pulses in bilateral upper extremities.  Left lower extremity has a bounding left femoral/popliteal/DP pulse. There is no pulse palpated in the right femoral/popliteal/DP/PT.  The right foot is cold to touch when compared to the left SKIN:color normal PSYCH: no abnormalities of mood noted, alert and oriented to situation  ED Results / Procedures / Treatments   Labs (all labs ordered are listed, but only abnormal results are displayed) Labs Reviewed  BASIC METABOLIC PANEL - Abnormal; Notable for the following components:      Result Value   Potassium 3.0 (*)    Glucose, Bld 254 (*)    BUN 37 (*)    Creatinine, Ser 2.52 (*)    GFR, Estimated 20 (*)    All other components within normal limits  CBC - Abnormal; Notable for the following components:   WBC 14.5 (*)    RBC 3.45 (*)    Hemoglobin 10.8 (*)    HCT 34.0 (*)    All other components within normal limits  TROPONIN I (HIGH SENSITIVITY) - Abnormal; Notable for the following components:   Troponin I (High Sensitivity) 23 (*)    All other components within normal limits  TROPONIN I (HIGH SENSITIVITY) - Abnormal; Notable for the following components:   Troponin I (High Sensitivity)  25 (*)    All other components within normal limits  RESP PANEL BY RT-PCR (FLU A&B, COVID) ARPGX2  PROTIME-INR  APTT  HEPATIC FUNCTION PANEL  LIPASE, BLOOD  TYPE AND SCREEN    EKG EKG Interpretation  Date/Time:  Tuesday December 02 2020 05:47:30 EST Ventricular Rate:  52 PR Interval:  134 QRS Duration: 130 QT Interval:  499 QTC Calculation: 465 R Axis:   20 Text Interpretation: Sinus rhythm Left ventricular hypertrophy Abnrm T, consider ischemia, anterolateral lds Anterior ST elevation, probably due to LVH changed from prior Confirmed by Ripley Fraise [phone]) on 12/02/2020 6:02:03 AM   Radiology DG Chest 2 View  Result Date: 12/01/2020 CLINICAL DATA:  Left chest pain, shortness of breath for 1 week EXAM: CHEST - 2 VIEW COMPARISON:  11/27/2020 FINDINGS: Frontal and lateral views of the chest demonstrates stable enlargement the cardiac silhouette. Marked ectasia of the thoracic aorta is stable. No acute airspace disease, effusion, or pneumothorax. There are no acute bony abnormalities. IMPRESSION: 1. Stable enlargement of the cardiac silhouette. 2. Stable thoracic aortic ectasia. 3. No acute airspace disease. Electronically Signed   By: Randa Ngo M.D.   On: 12/01/2020 22:41   CT Angio Chest/Abd/Pel for Dissection W and/or Wo Contrast  Result Date: 12/02/2020 CLINICAL DATA:  Concern for aortic dissection, chest pain radiating posteriorly, cold pulseless right leg. EXAM: CT ANGIOGRAPHY CHEST, ABDOMEN AND PELVIS TECHNIQUE: Non-contrast CT of the chest was initially obtained. Multidetector CT imaging through the chest, abdomen and pelvis was performed using the standard protocol during bolus administration of intravenous contrast. Multiplanar reconstructed images and MIPs were obtained and reviewed to evaluate the vascular anatomy. 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 Dissection flap extends into the aortic arch and branch vessels involving the shared origin of the brachiocephalic and left common carotid arteries with attenuation and abrupt tapering of the right internal carotid artery arising from what appears to be the false lumen while the right subclavian artery  is largely supplied by the true lumen. The left internal carotid is supplied by true lumen and normally opacified. Dissection flap extends into the proximal left subclavian artery as well though the more distal vessel and vertebral artery do appear supplied by true lumen. Dissection flap extends inferiorly throughout the descending aorta and into the abdomen as described below. There is a moderate low-attenuation pericardial effusion. Cardiomegaly with three-vessel coronary artery atherosclerosis. Central pulmonary arteries are normal caliber. No large central pulmonary arterial filling defects. Mediastinum/Nodes: No mediastinal fluid or gas. Normal thyroid gland and thoracic inlet. No acute abnormality of the trachea or esophagus. No worrisome mediastinal, hilar or axillary adenopathy. Lungs/Pleura: Atelectatic changes are present in the lungs. More bandlike areas of opacity in the left lung base, likely scarring or subsegmental atelectatic change. Trace bilateral effusions. No consolidative opacities. No concerning pulmonary nodules or masses. Musculoskeletal: Degenerative changes are present in the imaged spine and shoulders. No acute osseous abnormality or suspicious osseous lesion. No worrisome chest wall lesions. Review of the MIP images confirms the above findings. CTA ABDOMEN AND PELVIS FINDINGS VASCULAR Aorta: Propagation of the aortic dissection flap throughout the extent of the abdominal and beyond the bifurcation. The smaller lumen is likely the true lumen being compressed by an expanding false lumen, nearly effacing the true lumen at  the level of the infrarenal abdominal aorta. Celiac: Arising from the true lumen. 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 No proximal occlusions or other aneurysmal dilatation. No acute luminal abnormality of the vessel or proximal branches. SMA: Dissection flap propagates into the proximal SMA though more distal branches appear normally opacified and normally branching. Renals: Single renal arteries bilaterally. The left renal artery appears to arise from the false lumen and abruptly tapers (7/159) with absent enhancement of much of the renal parenchyma save a small amount of preserved upper pole enhancement supplied by a tiny accessory renal artery. Right renal artery appears to arise from the true lumen and is normally opacified without acute luminal abnormality. IMA: Origin of the IMA is not opacified possibly arising from the false lumen. Opacification of the distal branches, possibly supplied by left colic collateral flow. Inflow: Dissection flap extends beyond the aortic bifurcation with abrupt tapering and possible intramural thrombus within the right common iliac artery and attenuated opacification of the right internal and external iliac branches as well as the proximal outflow vessels. Dissection flap propagates into the left common iliac and external iliac artery. The left internal iliac artery appears supplied by the true lumen. Dissection continues into the proximal left outflow vessels likely terminating by the level of the left common femoral artery. Normal opacification of the deep and superficial arteries. Veins: Kummel markedly suboptimal venous opacification on this arterial phase exam. No gross abnormality is seen. Review of the MIP images confirms the above findings. NON-VASCULAR Hepatobiliary: No worrisome focal liver lesions. Smooth liver surface contour. Normal hepatic attenuation. Gallbladder partially decompressed at the time of exam. Small calcified  gallstone layering towards the gallbladder fundus. No pericholecystic fluid or inflammation. No biliary ductal dilatation. Pancreas: No pancreatic ductal dilatation or surrounding inflammatory changes. Spleen: Normal in size. No concerning splenic lesions., heterogeneous enhancement of the spleen is normal for the arterial phase of imaging. Adrenals/Urinary Tract: Lobular thickening of the bilateral adrenal glands without discrete nodules. The majority of the left renal parenchyma fails to enhance with only partial sparing of the cortex in the upper poles which appears supplied by a tiny accessory  renal artery. More normal right renal enhancement. Multiple fluid attenuation cysts are present in both kidneys. An additional hyperdense indeterminate 9 mm focus is noted along the over lower pole of the left kidney (7/167). No urolithiasis or hydronephrosis. Urinary bladder is largely decompressed at the time of exam and therefore poorly evaluated by CT imaging. No gross bladder abnormality. Stomach/Bowel: Distal esophagus, stomach and duodenum are unremarkable. No small bowel or colonic thickening or dilatation. Fairly uniform enhancement of the bowel though suboptimally assessed on this single-phase study. No evidence of obstruction Appendix is not visualized. No focal inflammation the vicinity of the cecum to suggest an occult appendicitis. Lymphatic: No suspicious or enlarged lymph nodes in the included lymphatic chains. Reproductive: Insert patient appears to be post hysterectomy. No concerning adnexal lesions. Other: No abdominopelvic free fluid or free gas. No bowel containing hernias. Musculoskeletal: No acute osseous abnormality or suspicious osseous lesion. Multilevel degenerative changes are present in the imaged portions of the spine. Additional degenerative changes in the hips and pelvis. Musculature is unremarkable. Review of the MIP images confirms the above findings. IMPRESSION: Vascular: 1. 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 2. Developing low-attenuation pericardial effusion. Consider echocardiography to exclude features of tamponade. 3. Dissection flap extends into the aortic arch and branch vessels involving the shared origin of the brachiocephalic and left common carotid arteries and the proximal left subclavian artery. Abrupt tapering of the right internal carotid artery arising from what appears to be the false lumen. The right subclavian artery appears largely supplied by the true lumen though somewhat attenuated possibly secondary to luminal narrowing or developing thrombus. Remaining proximal great vessels are more normally opacified. 4. Extension of the dissection flap through the abdominal aorta into the left inflow and proximal outflow vessels, terminating at the level of the left common femoral artery. 5. Dissection flap propagates into the proximal SMA though more distal branches appear normally opacified. 6. Left renal artery arises from the false lumen with infarct of the majority of the left renal parenchyma only a small portion of the upper pole spared by a tiny accessory renal artery. 7. Attenuation of the IMA origin, distal branch opacification likely supplied via collateral. 8. Abrupt tapering within the proximal right common iliac artery and attenuated opacification of the more distal inflow and proximal outflow vessels. Nonvascular: 1. Trace bilateral effusions. Bibasilar atelectasis and left basilar scarring. 2. Cholelithiasis without evidence of acute cholecystitis. 3. Prior hysterectomy. 4. Indeterminate 9 mm hyperdense focus along the over lower pole of the left kidney. Could reflect a hyperdense cyst versus solid lesion. Recommend further evaluation with nonemergent renal ultrasound on a nonemergent basis. Critical Value/emergent results were called by telephone at the time of discovery on 12/02/2020 at 6:30 AM to provider Ripley Fraise , who verbally acknowledged these results. Electronically Signed   By: Lovena Le M.D.   On: 12/02/2020 07:05    Procedures .Critical Care Performed by: Ripley Fraise, MD Authorized by: Ripley Fraise, MD   Critical care provider statement:    Critical care time (minutes):  55   Critical care start time:  12/02/2020 6:05 AM   Critical care end time:  12/02/2020 7:00 AM   Critical care time was exclusive of:  Separately billable procedures and treating other patients   Critical care was necessary to treat or prevent imminent or life-threatening deterioration of the following conditions:  Cardiac failure and circulatory failure   Critical care was time  spent personally by me on the following activities:  Ordering and performing treatments and interventions, ordering and review of laboratory studies, ordering and review of radiographic studies, pulse oximetry, discussions with consultants, development of treatment plan with patient or surrogate, examination of patient, obtaining history from patient or surrogate, review of old charts and re-evaluation of patient's condition   I assumed direction of critical care for this patient from another provider in my specialty: no      Medications Ordered in ED Medications  fentaNYL (SUBLIMAZE) injection 100 mcg (100 mcg Intravenous Given 12/02/20 0552)  sodium chloride 0.9 % bolus 1,000 mL (1,000 mLs Intravenous New Bag/Given 12/02/20 0554)  iohexol (OMNIPAQUE) 350 MG/ML injection 80 mL (80 mLs Intravenous Contrast Given 12/02/20 3382)    ED Course  I have reviewed the triage vital signs and the nursing notes.  Pertinent labs & imaging results that were available during my care of the patient were reviewed by me and considered in my medical decision making (see chart for details).    MDM Rules/Calculators/A&P                          6:08 AM Patient presents for chest pain.  She was seen on December 16 for chest pain but  decided to leave when recommended admission. She reports tonight's episode of chest pain was the worst she has ever had.  She reports it started in her epigastric and chest radiating to her back.  She then had onset of right leg numbness and weakness. On my exam patient has a heart murmur, and I cannot palpate a pulse in her right foot which appears to be new for her. Patient also has new EKG changes in the anterior leads.  Prior troponins remain unchanged. Given the severe chest pain with also acute numbness and weakness of the right lower extremity dissection is high on the differential.  Acute emboli is also in the differential.  I feel strongly the patient will benefit from a CT dissection study.  However patient has acute renal failure with creatinine of 2.5 I discussed this with Dr. Stanford Scotland with radiology, reports we can proceed with study if patient agrees. Also discussed this with the patient about the risk and benefits of the CT imaging.  She was informed that IV contrast can worsen her kidney function.  However after further discussion, she is agreeable with the proceed with CT imaging as I do feel the likelihood of an emergency condition is very high. 06:40am I received a call from radiology Dr. Marguerita Merles about patient having aortic dissection. Patient found to have extensive dissection including the carotids, as well as pericardial effusion. Informed Dr. Scot Dock with vascular surgery who will evaluate the patient. D/w  Dr. Kipp Brood with CT surgery who will evaluate patient 6:58 AM Pt awake/alert, talkative BP 96/83   Pulse (!) 56   Temp 98.2 F (36.8 C) (Oral)   Resp 17   Ht 1.727 m (5' 8" )   Wt 56.7 kg   SpO2 96%   BMI 19.01 kg/m  Awaiting disposition 7:32 AM Seen by Dr. Scot Dock with vascular surgery.  He would defer all vascular repair until definitive management by cardiothoracic surgery. At time of shift change in signout to Dr. Dorie Rank, awaiting final disposition with  cardiothoracic surgery Final Clinical Impression(s) / ED Diagnoses Final diagnoses:  Dissection of thoracoabdominal aorta [facility])    Rx / DC Orders ED Discharge Orders  None       Ripley Fraise, MD 12/02/20 [phone]

## 2020-12-02 NOTE — Consult Note (Signed)
REASON FOR CONSULT:    Type A aortic dissection.  The consult is requested by Dr. 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  Although she has involvement of the carotid arteries she has no strokelike symptoms.  She does have an ischemic right lower extremity with no Doppler signals in the right foot and paresthesias.  She is to be evaluated by cardiothoracic surgery.  I suspect she will need urgent repair and we can reevaluate her vascular status of the right leg after repair.  If she does not have return of blood flow to the right leg she would likely require a left to right femoral-femoral bypass.  Vascular surgery will follow closely.   Deitra Mayo, MD Office: [phone]   HPI:   Mary Stephens is a pleasant 64 y.o. female, who developed the fairly sudden onset of chest pain and back pain at approximately 7 PM last night.  This has persisted and ultimately she presented to the emergency department.  A CT scan of the chest showed evidence of a type a aortic dissection with involvement of the carotid arteries.  In addition, she was noted to have no Doppler signals in the right foot.  Vascular surgery is consulted.  The patient does have a history of hypertension.  She denies any history of diabetes, hypercholesterolemia, history of previous myocardial infarction, or history of congestive heart failure.  She has no family history of premature cardiovascular disease.  She does smoke a third of a pack per day of cigarettes and has been smoking for many years.  On my history the patient states that her chest pain and back pain have essentially resolved.  Her blood pressures been carefully controlled by the emergency department.  On questioning she does admit to some paresthesias in the right foot.   Past Medical History:  Diagnosis Date  . Anxiety   . Arthritis   . Colon polyps   .  Depression   . Diabetes mellitus without complication ([place])   . Hepatitis C   . Hyperlipidemia   . Hypertension   . Neuropathy    feet    Family History  Problem Relation Age of Onset  . Diabetes Mother   . Colon cancer Neg Hx   . Colon polyps Neg Hx   . Esophageal cancer Neg Hx   . Rectal cancer Neg Hx   . Stomach cancer Neg Hx     SOCIAL HISTORY: Social History   Socioeconomic History  . Marital status: Single    Spouse name: Not on file  . Number of children: 1  . Years of education: Not on file  . Highest education level: Not on file  Occupational History  . Occupation: unemployed  Tobacco Use  . Smoking status: Current Every Day Smoker    Packs/day: 0.25    Types: Cigarettes  . Smokeless tobacco: Never Used  . Tobacco comment: admits to cuttting back 4 cigarettes/day  Vaping Use  . Vaping Use: Never used  Substance and Sexual Activity  . Alcohol use: Yes    Alcohol/week: 1.0 standard drink    Types: 1 Standard drinks or equivalent per week    Comment: wine cooler  . Drug use: Not Currently    Types: Marijuana  . Sexual activity: Not on file  Other Topics Concern  . Not on file  Social History Narrative  . Not on file  Social Determinants of Health   Financial Resource Strain: Not on file  Food Insecurity: Not on file  Transportation Needs: Not on file  Physical Activity: Not on file  Stress: Not on file  Social Connections: Not on file  Intimate Partner Violence: Not on file    No Known Allergies  No current facility-administered medications for this encounter.   Current Outpatient Medications  Medication Sig Dispense Refill  . acetaminophen (TYLENOL) 325 MG tablet Take 650 mg by mouth every 12 (twelve) hours as needed for moderate pain.    Marland Kitchen amLODipine (NORVASC) 10 MG tablet Take 1 tablet (10 mg total) by mouth every morning. (Patient taking differently: Take 10 mg by mouth daily.) 90 tablet 3  . Artificial Saliva (DRY MOUTH SPRAY MT) Use  as directed 1 spray in the mouth or throat 6 (six) times daily. Biotene    . atorvastatin (LIPITOR) 10 MG tablet Take 1 tablet (10 mg total) by mouth daily. 90 tablet 3  . calcium carbonate (OSCAL) 1500 (600 Ca) MG TABS tablet Take 600 mg of elemental calcium by mouth daily with breakfast.    . Capsaicin 0.1 % CREA Apply 1 application topically in the morning, at noon, and at bedtime.    . Cholecalciferol (VITAMIN D-3) 125 MCG (5000 UT) TABS Take 1 tablet by mouth daily.    Marland Kitchen gabapentin (NEURONTIN) 300 MG capsule Take 300 mg by mouth 2 (two) times daily.    . Glucerna (GLUCERNA) LIQD Take 237 mLs by mouth 2 (two) times daily between meals.    Marland Kitchen glucose blood test strip Check BS twice daily, 250.00 100 each 12  . glucose monitoring kit (FREESTYLE) monitoring kit 1 each by Does not apply route as needed for other. Please check BS twice daily before meals, 250.00 1 each 0  . Lancets (FREESTYLE) lancets Check BS twice daily before meals, 250.00 100 each 12  . melatonin 3 MG TABS tablet Take 3 mg by mouth at bedtime as needed (insomnia).    . metoprolol succinate (TOPROL-XL) 25 MG 24 hr tablet Take 25 mg by mouth daily.    . Na Sulfate-K Sulfate-Mg Sulf 17.5-3.13-1.6 GM/177ML SOLN Suprep (no substitutions)-TAKE AS DIRECTED. 354 mL 0  . potassium chloride (K-DUR) 10 MEQ tablet Take 1 tablet (10 mEq total) by mouth daily. 30 tablet 1  . sertraline (ZOLOFT) 50 MG tablet Take 50 mg by mouth daily.      REVIEW OF SYSTEMS:  [X]  denotes positive finding, [ ]  denotes negative finding Cardiac  Comments:  Chest pain or chest pressure: x   Shortness of breath upon exertion:    Short of breath when lying flat:    Irregular heart rhythm:        Vascular    Pain in calf, thigh, or hip brought on by ambulation:    Pain in feet at night that wakes you up from your sleep:     Blood clot in your veins:    Leg swelling:         Pulmonary    Oxygen at home:    Productive cough:     Wheezing:          Neurologic    Sudden weakness in arms or legs:     Sudden numbness in arms or legs:     Sudden onset of difficulty speaking or slurred speech:    Temporary loss of vision in one eye:     Problems with dizziness:  Gastrointestinal    Blood in stool:     Vomited blood:         Genitourinary    Burning when urinating:     Blood in urine:        Psychiatric    Major depression:         Hematologic    Bleeding problems:    Problems with blood clotting too easily:        Skin    Rashes or ulcers:        Constitutional    Fever or chills:     PHYSICAL EXAM:   Vitals:   12/02/20 0516 12/02/20 0545 12/02/20 0615 12/02/20 0630  BP: (!) 107/91 100/82 104/61 96/83  Pulse: (!) 56     Resp: 20  15 17   Temp: 98.2 F (36.8 C)     TempSrc: Oral     SpO2: 96%     Weight: 56.7 kg     Height: 5' 8"  (1.727 m)       GENERAL: The patient is a well-nourished female, in no acute distress. The vital signs are documented above. CARDIAC: There is a regular rate and rhythm.  VASCULAR: I do not detect carotid bruits. On the right side, I do not palpate a femoral pulse.  I cannot obtain Doppler signals in the right foot.  She is able to move her foot but does have paresthesias in the right foot.  The right foot is cool compared to the left. On the left side she has a palpable femoral, popliteal, and posterior tibial pulse.  She has a brisk left dorsalis pedis signal with a Doppler. PULMONARY: There is good air exchange bilaterally without wheezing or rales. ABDOMEN: Soft and non-tender with normal pitched bowel sounds.  MUSCULOSKELETAL: There are no major deformities or cyanosis. NEUROLOGIC: She has paresthesias in her right foot. SKIN: There are no ulcers or rashes noted. PSYCHIATRIC: The patient has a normal affect.  DATA:    CT ANGIOGRAM CHEST ABDOMEN PELVIS: I have reviewed the images of her CT angiogram of the chest abdomen and pelvis.  She has a type a aortic dissection.  She  is also noted to have a pericardial effusion.  The dissection extends into the aortic arch branch vessels and involves the left common carotid artery and left subclavian artery.  There is also abrupt tapering of the right internal carotid artery.  The dissection flap extends through the abdominal aorta and terminates at the level of the left common femoral artery.  The dissection flap extends into the proximal SMA.  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  There is abrupt tapering of the proximal right common iliac artery.

## 2020-12-02 NOTE — Anesthesia Procedure Notes (Signed)
Central Venous Catheter Insertion Performed by: Audry Pili, MD, anesthesiologist Start/End12/21/2021 8:43 AM, 12/02/2020 8:45 AM Patient location: Pre-op. Preanesthetic checklist: patient identified, IV checked, risks and benefits discussed, surgical consent, monitors and equipment checked, pre-op evaluation, timeout performed and anesthesia consent Position: Trendelenburg Hand hygiene performed  and maximum sterile barriers used  Total catheter length 10. PA cath was placed.Swan type:thermodilution PA Cath depth:50 Procedure performed without using ultrasound guided technique. Attempts: 1 Patient tolerated the procedure well with no immediate complications.

## 2020-12-02 NOTE — H&P (Signed)
[address]       [place] [address]             [phone]                    Mary Stephens Mary Stephens Medical Record #[id number] Date of Birth: March 14, 1956  Referring: No ref. provider found Primary Care: Izetta Dakin, NP (Inactive) Primary Cardiologist: No primary care provider on file.  Chief Complaint:    Chief Complaint  Patient presents with  . Chest Pain    History of Present Illness:    Mary Stephens 64 y.o. female presents to the emergency department with acute onset chest and back pain.  Patient states that she had similar symptoms a week ago but left the emergency department AGAINST MEDICAL ADVICE.  She represented earlier last evening in addition to having right lower extremity paresthesias.  She states that she fell on the floor and was unable to get up for over an hour and a half.  Cross-sectional imaging showed an acute type a aortic dissection and CTS was consulted to assist with management.  She is currently having some chest and back pain right now, and continues to have paresthesias in the right lower extremity    Past Medical History:  Diagnosis Date  . Anxiety   . Arthritis   . Colon polyps   . Depression   . Diabetes mellitus without complication (HCC)   . Hepatitis C   . Hyperlipidemia   . Hypertension   . Neuropathy    feet    Past Surgical History:  Procedure Laterality Date  . COLONOSCOPY  ?[id number]?   in [place] (1st colon 20 polyps removed-per pt)  . PARTIAL HYSTERECTOMY  2001    Family History  Problem Relation Age of Onset  . Diabetes Mother   . Colon cancer Neg Hx   . Colon polyps Neg Hx   . Esophageal cancer Neg Hx   . Rectal cancer Neg Hx   . Stomach cancer Neg Hx      Social History   Tobacco Use  Smoking Status Current Every Day Smoker  . Packs/day: 0.25  . Types: Cigarettes  Smokeless Tobacco Never Used  Tobacco Comment   admits to cuttting back 4 cigarettes/day    Social History    Substance and Sexual Activity  Alcohol Use Yes  . Alcohol/week: 1.0 standard drink  . Types: 1 Standard drinks or equivalent per week   Comment: wine cooler     No Known Allergies  Current Facility-Administered Medications  Medication Dose Route Frequency Provider Last Rate Last Admin  . cefUROXime (ZINACEF) 1.5 g in sodium chloride 0.9 % 100 mL IVPB  1.5 g Intravenous To OR Afrah Burlison O, MD      . cefUROXime (ZINACEF) 750 mg in sodium chloride 0.9 % 100 mL IVPB  750 mg Intravenous To OR Ella Golomb O, MD      . dexmedetomidine (PRECEDEX) 400 MCG/100ML (4 mcg/mL) infusion  0.1-0.7 mcg/kg/hr Intravenous To OR Levone Otten O, MD      . EPINEPHrine (ADRENALIN) 4 mg in NS 250 mL (0.016 mg/mL) premix infusion  0-10 mcg/min Intravenous To OR Hajra Port O, MD      . heparin 30,000 units/NS 1000 mL solution for CELLSAVER   Other To OR Chrishawn Boley O, MD      . heparin sodium (porcine) 2,500 Units, papaverine 30 mg in electrolyte-148 (PLASMALYTE-148) 500 mL  irrigation   Irrigation To OR Marylan Glore O, MD      . insulin regular, human (MYXREDLIN) 100 units/ 100 mL infusion   Intravenous To OR Vanecia Limpert O, MD      . magnesium sulfate (IV Push/IM) injection 40 mEq  40 mEq Other To OR Sixto Bowdish O, MD      . milrinone (PRIMACOR) 20 MG/100 ML (0.2 mg/mL) infusion  0.3 mcg/kg/min Intravenous To OR Stratton Villwock O, MD      . nitroGLYCERIN 50 mg in dextrose 5 % 250 mL (0.2 mg/mL) infusion  2-200 mcg/min Intravenous To OR Judd Mccubbin O, MD      . norepinephrine (LEVOPHED) 4mg  in premix infusion  0-40 mcg/min Intravenous To OR Saleena Tamas O, MD      . phenylephrine (NEOSYNEPHRINE) 20-0.9 MG/250ML-% infusion  30-200 mcg/min Intravenous To OR Tinika Bucknam O, MD      . potassium chloride injection 80 mEq  80 mEq Other To OR Zohair Epp O, MD      . tranexamic acid (CYKLOKAPRON) 2,500 mg in sodium chloride 0.9 %  250 mL (10 mg/mL) infusion  1.5 mg/kg/hr Intravenous To OR Quantasia Stegner O, MD      . tranexamic acid (CYKLOKAPRON) bolus via infusion - over 30 minutes 850.5 mg  15 mg/kg Intravenous To OR Dinia Joynt O, MD      . tranexamic acid (CYKLOKAPRON) pump prime solution 113 mg  2 mg/kg Intracatheter To OR Julien Berryman O, MD      . vancomycin (VANCOREADY) IVPB 1250 mg/250 mL  1,250 mg Intravenous To OR Maddelyn Rocca, Eliezer Lofts, MD       Current Outpatient Medications  Medication Sig Dispense Refill  . acetaminophen (TYLENOL) 325 MG tablet Take 650 mg by mouth every 12 (twelve) hours as needed for moderate pain.    Marland Kitchen amLODipine (NORVASC) 10 MG tablet Take 1 tablet (10 mg total) by mouth every morning. (Patient taking differently: Take 10 mg by mouth daily.) 90 tablet 3  . Artificial Saliva (DRY MOUTH SPRAY MT) Use as directed 1 spray in the mouth or throat 6 (six) times daily. Biotene    . atorvastatin (LIPITOR) 10 MG tablet Take 1 tablet (10 mg total) by mouth daily. 90 tablet 3  . calcium carbonate (OSCAL) 1500 (600 Ca) MG TABS tablet Take 600 mg of elemental calcium by mouth daily with breakfast.    . Capsaicin 0.1 % CREA Apply 1 application topically in the morning, at noon, and at bedtime.    . Cholecalciferol (VITAMIN D-3) 125 MCG (5000 UT) TABS Take 1 tablet by mouth daily.    Marland Kitchen gabapentin (NEURONTIN) 300 MG capsule Take 300 mg by mouth 2 (two) times daily.    . Glucerna (GLUCERNA) LIQD Take 237 mLs by mouth 2 (two) times daily between meals.    Marland Kitchen glucose blood test strip Check BS twice daily, 250.00 100 each 12  . glucose monitoring kit (FREESTYLE) monitoring kit 1 each by Does not apply route as needed for other. Please check BS twice daily before meals, 250.00 1 each 0  . Lancets (FREESTYLE) lancets Check BS twice daily before meals, 250.00 100 each 12  . melatonin 3 MG TABS tablet Take 3 mg by mouth at bedtime as needed (insomnia).    . metoprolol succinate (TOPROL-XL) 25 MG 24  hr tablet Take 25 mg by mouth daily.    . Na Sulfate-K Sulfate-Mg Sulf 17.5-3.13-1.6 GM/177ML SOLN Suprep (no substitutions)-TAKE AS DIRECTED. 354  mL 0  . potassium chloride (K-DUR) 10 MEQ tablet Take 1 tablet (10 mEq total) by mouth daily. 30 tablet 1  . sertraline (ZOLOFT) 50 MG tablet Take 50 mg by mouth daily.      Review of Systems  Constitutional: Negative.   Cardiovascular: Positive for chest pain.  Musculoskeletal: Positive for back pain.  Neurological: Positive for tingling and weakness.    PHYSICAL EXAMINATION: BP 96/83   Pulse (!) 56   Temp 98.2 F (36.8 C) (Oral)   Resp 17   Ht 5\' 8"  (1.727 m)   Wt 56.7 kg   SpO2 96%   BMI 19.01 kg/m   Physical Exam Constitutional:      General: She is not in acute distress.    Appearance: She is normal weight. She is not ill-appearing or diaphoretic.  HENT:     Head: Normocephalic.  Cardiovascular:     Rate and Rhythm: Normal rate.     Pulses:          Radial pulses are 2+ on the right side and 2+ on the left side.       Posterior tibial pulses are 0 on the right side and 1+ on the left side.  Pulmonary:     Effort: Pulmonary effort is normal.  Abdominal:     Palpations: Abdomen is soft.  Musculoskeletal:        General: Normal range of motion.  Skin:    Comments: The right lower extremity is significantly cooler than the left.  Neurological:     General: No focal deficit present.     Mental Status: She is alert.      Diagnostic Studies & Laboratory data:     Recent Radiology Findings:   DG Chest 2 View  Result Date: 12/01/2020 CLINICAL DATA:  Left chest pain, shortness of breath for 1 week EXAM: CHEST - 2 VIEW COMPARISON:  11/27/2020 FINDINGS: Frontal and lateral views of the chest demonstrates stable enlargement the cardiac silhouette. Marked ectasia of the thoracic aorta is stable. No acute airspace disease, effusion, or pneumothorax. There are no acute bony abnormalities. IMPRESSION: 1. Stable enlargement of  the cardiac silhouette. 2. Stable thoracic aortic ectasia. 3. No acute airspace disease. Electronically Signed   By: Sharlet Salina M.D.   On: 12/01/2020 22:41   DG Chest 2 View  Result Date: 11/27/2020 CLINICAL DATA:  64 year old female with chest pain. EXAM: CHEST - 2 VIEW COMPARISON:  Chest radiograph dated 11/29/2015. FINDINGS: Mild cardiomegaly. No focal consolidation, pleural effusion or pneumothorax. No acute osseous pathology. IMPRESSION: Mild cardiomegaly with increased cardiopericardial silhouette since the prior radiograph. Electronically Signed   By: Elgie Collard M.D.   On: 11/27/2020 16:09   NM Pulmonary Perfusion  Result Date: 11/27/2020 CLINICAL DATA:  Positive D-dimer EXAM: NUCLEAR MEDICINE PERFUSION LUNG SCAN TECHNIQUE: Perfusion images were obtained in multiple projections after intravenous injection of radiopharmaceutical. Ventilation scans intentionally deferred if perfusion scan and chest x-ray adequate for interpretation during COVID 19 epidemic. RADIOPHARMACEUTICALS:  4.4 mCi Tc-83m MAA IV COMPARISON:  Chest x-ray 11/27/2020 FINDINGS: No perfusion defects seen to suggest pulmonary embolus. IMPRESSION: No evidence of pulmonary embolus. Electronically Signed   By: Charlett Nose M.D.   On: 11/27/2020 22:40   CT Angio Chest/Abd/Pel for Dissection W and/or Wo Contrast  Result Date: 12/02/2020 CLINICAL DATA:  Concern for aortic dissection, chest pain radiating posteriorly, cold pulseless right leg. EXAM: CT ANGIOGRAPHY CHEST, ABDOMEN AND PELVIS TECHNIQUE: Non-contrast CT of the chest was initially  obtained. Multidetector CT imaging through the chest, abdomen and pelvis was performed using the standard protocol during bolus administration of intravenous contrast. Multiplanar reconstructed images and MIPs were obtained and reviewed to evaluate the vascular anatomy. CONTRAST:  80mL OMNIPAQUE IOHEXOL 350 MG/ML SOLN COMPARISON:  Radiograph 12/01/2020 FINDINGS: CTA CHEST FINDINGS  Cardiovascular: Type A aortic dissection with dissection flap free edge seen in the mid ascending aorta (10/46). Dissection flap extends into the aortic arch and branch vessels involving the shared origin of the brachiocephalic and left common carotid arteries with attenuation and abrupt tapering of the right internal carotid artery arising from what appears to be the false lumen while the right subclavian artery is largely supplied by the true lumen. The left internal carotid is supplied by true lumen and normally opacified. Dissection flap extends into the proximal left subclavian artery as well though the more distal vessel and vertebral artery do appear supplied by true lumen. Dissection flap extends inferiorly throughout the descending aorta and into the abdomen as described below. There is a moderate low-attenuation pericardial effusion. Cardiomegaly with three-vessel coronary artery atherosclerosis. Central pulmonary arteries are normal caliber. No large central pulmonary arterial filling defects. Mediastinum/Nodes: No mediastinal fluid or gas. Normal thyroid gland and thoracic inlet. No acute abnormality of the trachea or esophagus. No worrisome mediastinal, hilar or axillary adenopathy. Lungs/Pleura: Atelectatic changes are present in the lungs. More bandlike areas of opacity in the left lung base, likely scarring or subsegmental atelectatic change. Trace bilateral effusions. No consolidative opacities. No concerning pulmonary nodules or masses. Musculoskeletal: Degenerative changes are present in the imaged spine and shoulders. No acute osseous abnormality or suspicious osseous lesion. No worrisome chest wall lesions. Review of the MIP images confirms the above findings. CTA ABDOMEN AND PELVIS FINDINGS VASCULAR Aorta: Propagation of the aortic dissection flap throughout the extent of the abdominal and beyond the bifurcation. The smaller lumen is likely the true lumen being compressed by an expanding false  lumen, nearly effacing the true lumen at the level of the infrarenal abdominal aorta. Celiac: Arising from the true lumen. 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 No proximal occlusions or other aneurysmal dilatation. No acute luminal abnormality of the vessel or proximal branches. SMA: Dissection flap propagates into the proximal SMA though more distal branches appear normally opacified and normally branching. Renals: Single renal arteries bilaterally. The left renal artery appears to arise from the false lumen and abruptly tapers (7/159) with absent enhancement of much of the renal parenchyma save a small amount of preserved upper pole enhancement supplied by a tiny accessory renal artery. Right renal artery appears to arise from the true lumen and is normally opacified without acute luminal abnormality. IMA: Origin of the IMA is not opacified possibly arising from the false lumen. Opacification of the distal branches, possibly supplied by left colic collateral flow. Inflow: Dissection flap extends beyond the aortic bifurcation with abrupt tapering and possible intramural thrombus within the right common iliac artery and attenuated opacification of the right internal and external iliac branches as well as the proximal outflow vessels. Dissection flap propagates into the left common iliac and external iliac artery. The left internal iliac artery appears supplied by the true lumen. Dissection continues into the proximal left outflow vessels likely terminating by the level of the left common femoral artery. Normal opacification of the deep and superficial arteries. Veins: Kummel markedly suboptimal venous opacification on this arterial phase exam. No gross abnormality is seen. Review of  the MIP images confirms the above findings. NON-VASCULAR Hepatobiliary: No worrisome focal liver lesions. Smooth liver surface contour. Normal hepatic attenuation. Gallbladder partially  decompressed at the time of exam. Small calcified gallstone layering towards the gallbladder fundus. No pericholecystic fluid or inflammation. No biliary ductal dilatation. Pancreas: No pancreatic ductal dilatation or surrounding inflammatory changes. Spleen: Normal in size. No concerning splenic lesions., heterogeneous enhancement of the spleen is normal for the arterial phase of imaging. Adrenals/Urinary Tract: Lobular thickening of the bilateral adrenal glands without discrete nodules. The majority of the left renal parenchyma fails to enhance with only partial sparing of the cortex in the upper poles which appears supplied by a tiny accessory renal artery. More normal right renal enhancement. Multiple fluid attenuation cysts are present in both kidneys. An additional hyperdense indeterminate 9 mm focus is noted along the over lower pole of the left kidney (7/167). No urolithiasis or hydronephrosis. Urinary bladder is largely decompressed at the time of exam and therefore poorly evaluated by CT imaging. No gross bladder abnormality. Stomach/Bowel: Distal esophagus, stomach and duodenum are unremarkable. No small bowel or colonic thickening or dilatation. Fairly uniform enhancement of the bowel though suboptimally assessed on this single-phase study. No evidence of obstruction Appendix is not visualized. No focal inflammation the vicinity of the cecum to suggest an occult appendicitis. Lymphatic: No suspicious or enlarged lymph nodes in the included lymphatic chains. Reproductive: Insert patient appears to be post hysterectomy. No concerning adnexal lesions. Other: No abdominopelvic free fluid or free gas. No bowel containing hernias. Musculoskeletal: No acute osseous abnormality or suspicious osseous lesion. Multilevel degenerative changes are present in the imaged portions of the spine. Additional degenerative changes in the hips and pelvis. Musculature is unremarkable. Review of the MIP images confirms the  above findings. IMPRESSION: Vascular: 1. 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 2. Developing low-attenuation pericardial effusion. Consider echocardiography to exclude features of tamponade. 3. Dissection flap extends into the aortic arch and branch vessels involving the shared origin of the brachiocephalic and left common carotid arteries and the proximal left subclavian artery. Abrupt tapering of the right internal carotid artery arising from what appears to be the false lumen. The right subclavian artery appears largely supplied by the true lumen though somewhat attenuated possibly secondary to luminal narrowing or developing thrombus. Remaining proximal great vessels are more normally opacified. 4. Extension of the dissection flap through the abdominal aorta into the left inflow and proximal outflow vessels, terminating at the level of the left common femoral artery. 5. Dissection flap propagates into the proximal SMA though more distal branches appear normally opacified. 6. Left renal artery arises from the false lumen with infarct of the majority of the left renal parenchyma only a small portion of the upper pole spared by a tiny accessory renal artery. 7. Attenuation of the IMA origin, distal branch opacification likely supplied via collateral. 8. Abrupt tapering within the proximal right common iliac artery and attenuated opacification of the more distal inflow and proximal outflow vessels. Nonvascular: 1. Trace bilateral effusions. Bibasilar atelectasis and left basilar scarring. 2. Cholelithiasis without evidence of acute cholecystitis. 3. Prior hysterectomy. 4. Indeterminate 9 mm hyperdense focus along the over lower pole of the left kidney. Could reflect a hyperdense cyst versus solid lesion. Recommend further evaluation with nonemergent renal ultrasound on a nonemergent basis. Critical Value/emergent results were called by telephone at the time of  discovery on 12/02/2020 at 6:30 AM to provider Zadie Rhine ,  who verbally acknowledged these results. Electronically Signed   By: Kreg Shropshire M.D.   On: 12/02/2020 07:05       I have independently reviewed the above radiology studies  and reviewed the findings with the patient.   Recent Lab Findings: Lab Results  Component Value Date   WBC 14.5 (H) 12/01/2020   HGB 10.8 (L) 12/01/2020   HCT 34.0 (L) 12/01/2020   PLT 183 12/01/2020   GLUCOSE 254 (H) 12/01/2020   CHOL 227 (H) 10/28/2017   TRIG 183 (H) 10/28/2017   HDL 49 10/28/2017   LDLCALC 141 (H) 10/28/2017   ALT 13 12/02/2020   AST 17 12/02/2020   NA 141 12/01/2020   K 3.0 (L) 12/01/2020   CL 104 12/01/2020   CREATININE 2.52 (H) 12/01/2020   BUN 37 (H) 12/01/2020   CO2 23 12/01/2020   TSH 0.95 09/08/2016   INR 1.2 12/01/2020   HGBA1C 5.3 01/27/2018        Assessment / Plan:   64 year old female with an acute type a dissection that extends from the mid a sending down to the bifurcation.  Right lower extremity shows signs of ischemia.  She will be taken to the operating theater for a type A dissection repair.  Vascular surgery is already on board, and we will check pulses at the end of our surgery to ensure that she does not need fenestration of her dissection flap for reperfusion of her right lower extremity.      Corliss Skains 12/02/2020 7:49 AM

## 2020-12-02 NOTE — Anesthesia Postprocedure Evaluation (Signed)
Anesthesia Post Note  Patient: Mary Stephens  Procedure(s) Performed: REPAIR OF ACUTE ASCENDING THORACIC AORTIC DISSECTION VIA CIRC ARREST USING HEMASHIELD PLATINUM 28 MM VASCULAR GRAFT. (N/A Chest) TRANSESOPHAGEAL ECHOCARDIOGRAM (TEE)     Patient location during evaluation: ICU Anesthesia Type: General Level of consciousness: sedated and patient remains intubated per anesthesia plan Pain management: pain level controlled Vital Signs Assessment: post-procedure vital signs reviewed and stable Respiratory status: patient remains intubated per anesthesia plan Cardiovascular status: stable Postop Assessment: no apparent nausea or vomiting Anesthetic complications: no   No complications documented.  Last Vitals:  Vitals:   12/02/20 0750 12/02/20 1533  BP:  (!) 189/101  Pulse:    Resp: 19   Temp:    SpO2:      Last Pain:  Vitals:   12/02/20 0516  TempSrc: Oral  PainSc:                  Audry Pili

## 2020-12-02 NOTE — Anesthesia Procedure Notes (Signed)
Procedure Name: Intubation Date/Time: 12/02/2020 8:29 AM Performed by: Glynda Jaeger, CRNA Pre-anesthesia Checklist: Patient identified, Patient being monitored, Timeout performed, Emergency Drugs available and Suction available Patient Re-evaluated:Patient Re-evaluated prior to induction Oxygen Delivery Method: Circle System Utilized Preoxygenation: Pre-oxygenation with 100% oxygen Induction Type: IV induction Ventilation: Mask ventilation without difficulty Laryngoscope Size: Glidescope and 3 Grade View: Grade I Tube type: Oral Tube size: 8.0 mm Number of attempts: 1 Airway Equipment and Method: Stylet Placement Confirmation: ETT inserted through vocal cords under direct vision,  positive ETCO2 and breath sounds checked- equal and bilateral Secured at: 21 cm Tube secured with: Tape Dental Injury: Teeth and Oropharynx as per pre-operative assessment

## 2020-12-03 ENCOUNTER — Inpatient Hospital Stay (HOSPITAL_COMMUNITY): Payer: Medicare (Managed Care)

## 2020-12-03 ENCOUNTER — Encounter (HOSPITAL_COMMUNITY): Payer: Self-pay | Admitting: Thoracic Surgery (Cardiothoracic Vascular Surgery)

## 2020-12-03 LAB — BASIC METABOLIC PANEL
Anion gap: 13 (ref 5–15)
Anion gap: 15 (ref 5–15)
BUN: 35 mg/dL — ABNORMAL HIGH (ref 8–23)
BUN: 37 mg/dL — ABNORMAL HIGH (ref 8–23)
CO2: 20 mmol/L — ABNORMAL LOW (ref 22–32)
CO2: 19 mmol/L — ABNORMAL LOW (ref 22–32)
Calcium: 7.9 mg/dL — ABNORMAL LOW (ref 8.9–10.3)
Calcium: 8.2 mg/dL — ABNORMAL LOW (ref 8.9–10.3)
Chloride: 109 mmol/L (ref 98–111)
Chloride: 108 mmol/L (ref 98–111)
Creatinine, Ser: 3.14 mg/dL — ABNORMAL HIGH (ref 0.44–1.00)
Creatinine, Ser: 3.53 mg/dL — ABNORMAL HIGH (ref 0.44–1.00)
GFR, Estimated: 16 mL/min — ABNORMAL LOW (ref >60)
GFR, Estimated: 14 mL/min — ABNORMAL LOW (ref >60)
Glucose, Bld: 104 mg/dL — ABNORMAL HIGH (ref 70–99)
Glucose, Bld: 135 mg/dL — ABNORMAL HIGH (ref 70–99)
Potassium: 3.6 mmol/L (ref 3.5–5.1)
Potassium: 3.6 mmol/L (ref 3.5–5.1)
Sodium: 142 mmol/L (ref 135–145)
Sodium: 142 mmol/L (ref 135–145)

## 2020-12-03 LAB — BPAM FFP
Blood Product Expiration Date: 202112252359
Blood Product Expiration Date: 202112252359
ISSUE DATE / TIME: 202112211355
ISSUE DATE / TIME: 202112211355
Unit Type and Rh: 5100
Unit Type and Rh: 9500

## 2020-12-03 LAB — POCT I-STAT 7, (LYTES, BLD GAS, ICA,H+H)
Acid-base deficit: 4 mmol/L — ABNORMAL HIGH (ref 0.0–2.0)
Acid-base deficit: 5 mmol/L — ABNORMAL HIGH (ref 0.0–2.0)
Acid-base deficit: 2 mmol/L (ref 0.0–2.0)
Acid-base deficit: 4 mmol/L — ABNORMAL HIGH (ref 0.0–2.0)
Bicarbonate: 21.1 mmol/L (ref 20.0–28.0)
Bicarbonate: 19.9 mmol/L — ABNORMAL LOW (ref 20.0–28.0)
Bicarbonate: 21.6 mmol/L (ref 20.0–28.0)
Bicarbonate: 19.8 mmol/L — ABNORMAL LOW (ref 20.0–28.0)
Calcium, Ion: 1.18 mmol/L (ref 1.15–1.40)
Calcium, Ion: 1.15 mmol/L (ref 1.15–1.40)
Calcium, Ion: 1.13 mmol/L — ABNORMAL LOW (ref 1.15–1.40)
Calcium, Ion: 1.16 mmol/L (ref 1.15–1.40)
HCT: 37 % (ref 36.0–46.0)
HCT: 38 % (ref 36.0–46.0)
HCT: 37 % (ref 36.0–46.0)
HCT: 34 % — ABNORMAL LOW (ref 36.0–46.0)
Hemoglobin: 12.6 g/dL (ref 12.0–15.0)
Hemoglobin: 12.9 g/dL (ref 12.0–15.0)
Hemoglobin: 12.6 g/dL (ref 12.0–15.0)
Hemoglobin: 11.6 g/dL — ABNORMAL LOW (ref 12.0–15.0)
O2 Saturation: 93 %
O2 Saturation: 89 %
O2 Saturation: 88 %
O2 Saturation: 84 %
Patient temperature: 36.8
Patient temperature: 36.5
Patient temperature: 36.5
Patient temperature: 98
Potassium: 3.6 mmol/L (ref 3.5–5.1)
Potassium: 3.6 mmol/L (ref 3.5–5.1)
Potassium: 3.6 mmol/L (ref 3.5–5.1)
Potassium: 3.4 mmol/L — ABNORMAL LOW (ref 3.5–5.1)
Sodium: 143 mmol/L (ref 135–145)
Sodium: 144 mmol/L (ref 135–145)
Sodium: 145 mmol/L (ref 135–145)
Sodium: 143 mmol/L (ref 135–145)
TCO2: 22 mmol/L (ref 22–32)
TCO2: 21 mmol/L — ABNORMAL LOW (ref 22–32)
TCO2: 23 mmol/L (ref 22–32)
TCO2: 21 mmol/L — ABNORMAL LOW (ref 22–32)
pCO2 arterial: 38.6 mmHg (ref 32.0–48.0)
pCO2 arterial: 33.7 mmHg (ref 32.0–48.0)
pCO2 arterial: 32 mmHg (ref 32.0–48.0)
pCO2 arterial: 31 mmHg — ABNORMAL LOW (ref 32.0–48.0)
pH, Arterial: 7.344 — ABNORMAL LOW (ref 7.350–7.450)
pH, Arterial: 7.377 (ref 7.350–7.450)
pH, Arterial: 7.435 (ref 7.350–7.450)
pH, Arterial: 7.411 (ref 7.350–7.450)
pO2, Arterial: 70 mmHg — ABNORMAL LOW (ref 83.0–108.0)
pO2, Arterial: 55 mmHg — ABNORMAL LOW (ref 83.0–108.0)
pO2, Arterial: 51 mmHg — ABNORMAL LOW (ref 83.0–108.0)
pO2, Arterial: 46 mmHg — ABNORMAL LOW (ref 83.0–108.0)

## 2020-12-03 LAB — GLUCOSE, CAPILLARY
Glucose-Capillary: 105 mg/dL — ABNORMAL HIGH (ref 70–99)
Glucose-Capillary: 108 mg/dL — ABNORMAL HIGH (ref 70–99)
Glucose-Capillary: 111 mg/dL — ABNORMAL HIGH (ref 70–99)
Glucose-Capillary: 118 mg/dL — ABNORMAL HIGH (ref 70–99)
Glucose-Capillary: 121 mg/dL — ABNORMAL HIGH (ref 70–99)
Glucose-Capillary: 121 mg/dL — ABNORMAL HIGH (ref 70–99)
Glucose-Capillary: 122 mg/dL — ABNORMAL HIGH (ref 70–99)
Glucose-Capillary: 127 mg/dL — ABNORMAL HIGH (ref 70–99)
Glucose-Capillary: 133 mg/dL — ABNORMAL HIGH (ref 70–99)
Glucose-Capillary: 134 mg/dL — ABNORMAL HIGH (ref 70–99)
Glucose-Capillary: 157 mg/dL — ABNORMAL HIGH (ref 70–99)
Glucose-Capillary: 173 mg/dL — ABNORMAL HIGH (ref 70–99)
Glucose-Capillary: 194 mg/dL — ABNORMAL HIGH (ref 70–99)

## 2020-12-03 LAB — PREPARE CRYOPRECIPITATE
Unit division: 0
Unit division: 0

## 2020-12-03 LAB — CBC
HCT: 39 % (ref 36.0–46.0)
HCT: 34.8 % — ABNORMAL LOW (ref 36.0–46.0)
Hemoglobin: 13.2 g/dL (ref 12.0–15.0)
Hemoglobin: 12.6 g/dL (ref 12.0–15.0)
MCH: 31.1 pg (ref 26.0–34.0)
MCH: 32.1 pg (ref 26.0–34.0)
MCHC: 33.8 g/dL (ref 30.0–36.0)
MCHC: 36.2 g/dL — ABNORMAL HIGH (ref 30.0–36.0)
MCV: 91.8 fL (ref 80.0–100.0)
MCV: 88.8 fL (ref 80.0–100.0)
Platelets: 101 10*3/uL — ABNORMAL LOW (ref 150–400)
Platelets: 89 10*3/uL — ABNORMAL LOW (ref 150–400)
RBC: 4.25 MIL/uL (ref 3.87–5.11)
RBC: 3.92 MIL/uL (ref 3.87–5.11)
RDW: 14.2 % (ref 11.5–15.5)
RDW: 14.4 % (ref 11.5–15.5)
WBC: 17.1 10*3/uL — ABNORMAL HIGH (ref 4.0–10.5)
WBC: 17.9 10*3/uL — ABNORMAL HIGH (ref 4.0–10.5)
nRBC: 0 % (ref 0.0–0.2)
nRBC: 0 % (ref 0.0–0.2)

## 2020-12-03 LAB — PREPARE PLATELET PHERESIS
Unit division: 0
Unit division: 0

## 2020-12-03 LAB — PREPARE FRESH FROZEN PLASMA
Unit division: 0
Unit division: 0

## 2020-12-03 LAB — BPAM CRYOPRECIPITATE
Blood Product Expiration Date: 202112211938
Blood Product Expiration Date: 202112211938
ISSUE DATE / TIME: 202112211411
ISSUE DATE / TIME: 202112211411
Unit Type and Rh: 5100
Unit Type and Rh: 5100

## 2020-12-03 LAB — COOXEMETRY PANEL
Carboxyhemoglobin: 0.8 % (ref 0.5–1.5)
Methemoglobin: 0.9 % (ref 0.0–1.5)
O2 Saturation: 63.8 %
Total hemoglobin: 13.3 g/dL (ref 12.0–16.0)

## 2020-12-03 LAB — BPAM PLATELET PHERESIS
Blood Product Expiration Date: 202112232359
Blood Product Expiration Date: 202112232359
ISSUE DATE / TIME: 202112211355
ISSUE DATE / TIME: 202112211355
Unit Type and Rh: 5100
Unit Type and Rh: 6200

## 2020-12-03 LAB — MAGNESIUM
Magnesium: 2 mg/dL (ref 1.7–2.4)
Magnesium: 1.8 mg/dL (ref 1.7–2.4)

## 2020-12-03 LAB — SURGICAL PATHOLOGY

## 2020-12-03 MED ORDER — POTASSIUM CHLORIDE 10 MEQ/50ML IV SOLN
10.0000 meq | INTRAVENOUS | Status: AC
  Administered 2020-12-03 (×2): 10 meq via INTRAVENOUS
  Filled 2020-12-03 (×2): qty 50

## 2020-12-03 MED ORDER — CHLORHEXIDINE GLUCONATE 0.12 % MT SOLN
15.0000 mL | Freq: Two times a day (BID) | OROMUCOSAL | Status: DC
  Administered 2020-12-04 – 2020-12-12 (×14): 15 mL via OROMUCOSAL
  Filled 2020-12-03 (×14): qty 15

## 2020-12-03 MED ORDER — ENOXAPARIN SODIUM 30 MG/0.3ML ~~LOC~~ SOLN
30.0000 mg | Freq: Every day | SUBCUTANEOUS | Status: DC
  Administered 2020-12-03 – 2020-12-08 (×6): 30 mg via SUBCUTANEOUS
  Filled 2020-12-03 (×6): qty 0.3

## 2020-12-03 MED ORDER — POTASSIUM CHLORIDE 10 MEQ/50ML IV SOLN
INTRAVENOUS | Status: AC
  Administered 2020-12-03: 03:00:00 10 meq via INTRAVENOUS
  Filled 2020-12-03: qty 100

## 2020-12-03 MED ORDER — GABAPENTIN 300 MG PO CAPS
300.0000 mg | ORAL_CAPSULE | Freq: Every day | ORAL | Status: DC
  Administered 2020-12-04 – 2020-12-11 (×8): 300 mg via ORAL
  Filled 2020-12-03 (×8): qty 1

## 2020-12-03 MED ORDER — INSULIN ASPART 100 UNIT/ML ~~LOC~~ SOLN
0.0000 [IU] | SUBCUTANEOUS | Status: DC
  Administered 2020-12-03 – 2020-12-06 (×7): 2 [IU] via SUBCUTANEOUS

## 2020-12-03 MED ORDER — POTASSIUM CHLORIDE 10 MEQ/50ML IV SOLN
10.0000 meq | INTRAVENOUS | Status: AC
  Administered 2020-12-03: 01:00:00 10 meq via INTRAVENOUS

## 2020-12-03 MED ORDER — METOPROLOL TARTRATE 25 MG PO TABS
25.0000 mg | ORAL_TABLET | Freq: Two times a day (BID) | ORAL | Status: DC
Start: 2020-12-03 — End: 2020-12-08
  Administered 2020-12-03 – 2020-12-07 (×9): 25 mg via ORAL
  Filled 2020-12-03 (×9): qty 1

## 2020-12-03 MED ORDER — ORAL CARE MOUTH RINSE
15.0000 mL | Freq: Two times a day (BID) | OROMUCOSAL | Status: DC
  Administered 2020-12-03 – 2020-12-11 (×9): 15 mL via OROMUCOSAL

## 2020-12-03 MED ORDER — NICARDIPINE HCL IN NACL 40-0.83 MG/200ML-% IV SOLN
3.0000 mg/h | INTRAVENOUS | Status: DC
  Administered 2020-12-03: 09:00:00 8 mg/h via INTRAVENOUS
  Administered 2020-12-03: 14:00:00 10 mg/h via INTRAVENOUS
  Administered 2020-12-03: 8 mg/h via INTRAVENOUS
  Filled 2020-12-03 (×4): qty 200

## 2020-12-03 MED ORDER — SODIUM BICARBONATE 8.4 % IV SOLN
50.0000 meq | Freq: Once | INTRAVENOUS | Status: AC
  Administered 2020-12-03: 10:00:00 50 meq via INTRAVENOUS

## 2020-12-03 MED ORDER — MILRINONE LACTATE IN DEXTROSE 20-5 MG/100ML-% IV SOLN
0.1250 ug/kg/min | INTRAVENOUS | Status: DC
  Administered 2020-12-03: 09:00:00 0.125 ug/kg/min via INTRAVENOUS
  Filled 2020-12-03: qty 100

## 2020-12-03 NOTE — Progress Notes (Signed)
[address]       [place] [address]             [phone]                 1 Day Post-Op Procedure(s) (LRB): REPAIR OF ACUTE ASCENDING THORACIC AORTIC DISSECTION VIA CIRC ARREST USING HEMASHIELD PLATINUM 28 MM VASCULAR GRAFT. (N/A) TRANSESOPHAGEAL ECHOCARDIOGRAM (TEE)   Events: Slow to wake up Following commands this am _______________________________________________________________ Vitals: BP 109/67   Pulse 78   Temp 97.7 F (36.5 C)   Resp 18   Ht 5\' 8"  (1.727 m)   Wt 67 kg   SpO2 94%   BMI 22.46 kg/m   - Neuro: arousable, nonfocal  - Cardiovascular: sinus  Drips: milr 0.25.  On and off cardene overnight   PAP: (19-39)/(6-21) 31/15 CO:  [3.8 L/min-5.5 L/min] 5.5 L/min CI:  [2.3 L/min/m2-3.3 L/min/m2] 3.3 L/min/m2  - Pulm: Clear Vent Mode: PRVC;SIMV;PSV FiO2 (%):  [50 %-70 %] 50 % Set Rate:  [4 bmp-12 bmp] 4 bmp Vt Set:  [550 mL] 550 mL PEEP:  [5 cmH20] 5 cmH20 Pressure Support:  [10 cmH20] 10 cmH20 Plateau Pressure:  [15 cmH20-20 cmH20] 20 cmH20  ABG    Component Value Date/Time   PHART 7.347 (L) 12/02/2020 1532   PCO2ART 42.2 12/02/2020 1532   PO2ART 57 (L) 12/02/2020 1532   HCO3 23.4 12/02/2020 1532   TCO2 25 12/02/2020 1532   ACIDBASEDEF 3.0 (H) 12/02/2020 1532   O2SAT 63.8 12/03/2020 0407    - Abd: abd soft - Extremity: warm  .Intake/Output      12/21 0701 12/22 0700 12/22 0701 12/23 0700   I.V. (mL/kg) 2740 (40.9) 54.2 (0.8)   Blood 2715    NG/GT 60    IV Piggyback 1199.9    Total Intake(mL/kg) 6714.9 (100.2) 54.2 (0.8)   Urine (mL/kg/hr) 710 (0.4)    Emesis/NG output 50    Chest Tube 390    Total Output 1150    Net +5564.9 +54.2           _______________________________________________________________ Labs: CBC Latest Ref Rng & Units 12/03/2020 12/02/2020 12/02/2020  WBC 4.0 - 10.5 K/uL 17.1(H) 19.1(H) -  Hemoglobin 12.0 - 15.0 g/dL 13.2 13.8 13.6  Hematocrit 36.0 - 46.0 % 39.0 41.4 40.0  Platelets  150 - 400 K/uL 101(L) 120(L) -   CMP Latest Ref Rng & Units 12/03/2020 12/02/2020 12/02/2020  Glucose 70 - 99 mg/dL 104(H) 202(H) -  BUN 8 - 23 mg/dL 35(H) 34(H) -  Creatinine 0.44 - 1.00 mg/dL 3.14(H) 2.86(H) -  Sodium 135 - 145 mmol/L 142 138 145  Potassium 3.5 - 5.1 mmol/L 3.6 2.8(L) 3.2(L)  Chloride 98 - 111 mmol/L 109 104 -  CO2 22 - 32 mmol/L 20(L) 18(L) -  Calcium 8.9 - 10.3 mg/dL 7.9(L) 7.5(L) -  Total Protein 6.5 - 8.1 g/dL - - -  Total Bilirubin 0.3 - 1.2 mg/dL - - -  Alkaline Phos 38 - 126 U/L - - -  AST 15 - 41 U/L - - -  ALT 0 - 44 U/L - - -    CXR: Right effusion  _______________________________________________________________  Assessment and Plan: POD 1 s/p type A dissection repair  Neuro: arousable.  Sedation off CV: will wean milr to 0.125 today.  Titrate cardene for SBP <140 Pulm: wean to extubate.  Will follow pleural effusion Renal: creat up, and uop down.  Will consult nephrology GI:  will start PO once extubated Heme: stable ID: [email or web address] Endo: will transition to SSI once extubated Dispo: continue ICU care  Melodie Bouillon, MD 12/03/2020 8:20 AM

## 2020-12-03 NOTE — Addendum Note (Signed)
Addendum  created 12/03/20 0814 by Josephine Igo, CRNA   Order list changed, Pharmacy for encounter modified

## 2020-12-03 NOTE — Progress Notes (Signed)
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 (N/A) TRANSESOPHAGEAL ECHOCARDIOGRAM (TEE)   Total Length of Stay:  LOS: 1 day  Events:   Extubated milr off Low uop nephro on board    BP 135/68   Pulse 87   Temp 98.6 F (37 C)   Resp (!) 23   Ht 5\' 8"  (1.727 m)   Wt 67 kg   SpO2 100%   BMI 22.46 kg/m   PAP: (22-45)/(6-19) 31/14 CO:  [4.3 L/min-6.5 L/min] 6.5 L/min CI:  [2.5 L/min/m2-3.9 L/min/m2] 3.9 L/min/m2  Vent Mode: PSV;CPAP FiO2 (%):  [40 %-70 %] 40 % Set Rate:  [4 bmp-12 bmp] 4 bmp Vt Set:  [550 mL] 550 mL PEEP:  [5 cmH20] 5 cmH20 Pressure Support:  [10 cmH20] 10 cmH20 Plateau Pressure:  [15 cmH20-20 cmH20] 20 cmH20  . sodium chloride    . sodium chloride Stopped (12/03/20 0832)  . sodium chloride 20 mL/hr at 12/02/20 1618  . cefUROXime (ZINACEF)  IV Stopped (12/03/20 0517)  . dexmedetomidine (PRECEDEX) IV infusion Stopped (12/03/20 0935)  . insulin Stopped (12/03/20 1253)  . lactated ringers    . lactated ringers Stopped (12/02/20 1515)  . lactated ringers 20 mL/hr at 12/03/20 1300  . magnesium sulfate Stopped (12/02/20 1515)  . milrinone 0.125 mcg/kg/min (12/03/20 1300)  . niCARDipine 10 mg/hr (12/03/20 1414)  . nitroGLYCERIN Stopped (12/02/20 1848)  . phenylephrine (NEO-SYNEPHRINE) Adult infusion Stopped (12/02/20 2105)    I/O last 3 completed shifts: In: 6714.9 [I.V.:2740; Blood:2715; NG/GT:60; IV Piggyback:1199.9] Out: 1150 [Urine:710; Emesis/NG output:50; Chest Tube:390]   CBC Latest Ref Rng & Units 12/03/2020 12/03/2020 12/03/2020  WBC 4.0 - 10.5 K/uL 17.9(H) - -  Hemoglobin 12.0 - 15.0 g/dL 12.6 12.6 12.9  Hematocrit 36.0 - 46.0 % 34.8(L) 37.0 38.0  Platelets 150 - 400 K/uL 89(L) - -    BMP Latest Ref Rng &  Units 12/03/2020 12/03/2020 12/03/2020  Glucose 70 - 99 mg/dL 135(H) - -  BUN 8 - 23 mg/dL 37(H) - -  Creatinine 0.44 - 1.00 mg/dL 3.53(H) - -  BUN/Creat Ratio 12 - 28 - - -  Sodium 135 - 145 mmol/L 142 145 144  Potassium 3.5 - 5.1 mmol/L 3.6 3.6 3.6  Chloride 98 - 111 mmol/L 108 - -  CO2 22 - 32 mmol/L 19(L) - -  Calcium 8.9 - 10.3 mg/dL 8.2(L) - -    ABG    Component Value Date/Time   PHART 7.435 12/03/2020 1120   PCO2ART 32.0 12/03/2020 1120   PO2ART 51 (L) 12/03/2020 1120   HCO3 21.6 12/03/2020 1120   TCO2 23 12/03/2020 1120   ACIDBASEDEF 2.0 12/03/2020 1120   O2SAT 88.0 12/03/2020 1120       Melodie Bouillon, MD 12/03/2020 5:37 PM

## 2020-12-03 NOTE — Consult Note (Signed)
Reason for Consult: AKI/CKD stage IV Referring Physician: Cliffton Asters, MD  Mary Stephens is an 64 y.o. female with a PMH significant for HTN, DM type 2, tobacco use, chronic hepatitis C, and CKD stage IV who presented to [facility] ED on 11/27/20 with left sided chest pain radiating to her neck and down her left arm, worse with breathing.  In the ED she had flipped T waves and an elevated tropoinin, however she left AMA only to return on 12/01/20 with worsening chest and back pain.  She also reported right lower extremity paresthesias.  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  CT and Vascular surgery were consulted and pt underwent emergent surgical repair on 12/02/20.  We were consulted due to the development of oliguric AKI/CKD stage IV.  The trend in Scr is seen below.  The CT angiogram also showed that her left renal artery arose from the false lumen and abruptly tapered with absent enhancement of most of the renal parenchyma except for a small region of the upper pole from a small accessory renal artery.  The right renal artery was not involved.  Of note, she had been seen in our office in May with Dr. Allena Katz and her Scr was 3.1 at that time, however she did not keep any follow up appointments or lab visits since.  Trend in Creatinine: Creatinine, Ser  Date/Time Value Ref Range Status  12/03/2020 04:07 AM 3.14 (H) 0.44 - 1.00 mg/dL Final  16/09/9603 54:09 PM 2.86 (H) 0.44 - 1.00 mg/dL Final  81/19/1478 29:56 PM 2.00 (H) 0.44 - 1.00 mg/dL Final  21/30/8657 84:69 PM 2.20 (H) 0.44 - 1.00 mg/dL Final  62/95/2841 32:44 PM 2.30 (H) 0.44 - 1.00 mg/dL Final  12/15/7251 66:44 AM 2.10 (H) 0.44 - 1.00 mg/dL Final  03/47/4259 56:38 AM 2.50 (H) 0.44 - 1.00 mg/dL Final  75/64/3329 51:88 PM 2.52 (H) 0.44 - 1.00 mg/dL Final  41/66/0630 16:01 PM 2.43 (H) 0.44 - 1.00 mg/dL Final  09/32/3557 32:20 PM 1.53 (H) 0.57 -  1.00 mg/dL Final  25/42/7062 37:62 PM 1.60 (H) 0.57 - 1.00 mg/dL Final  83/15/1761 60:73 PM 1.73 (H) 0.44 - 1.00 mg/dL Final  71/05/2693 85:46 PM 1.02 (H) 0.44 - 1.00 mg/dL Final   Creat  Date/Time Value Ref Range Status  10/20/2016 10:37 AM 1.21 (H) 0.50 - 0.99 mg/dL Final  27/02/5008 38:18 AM 1.40 (H) 0.50 - 0.99 mg/dL Final  29/93/7169 67:89 PM 1.05 (H) 0.50 - 0.99 mg/dL Final  38/09/1750 02:58 PM 1.31 (H) 0.50 - 1.10 mg/dL Final  52/77/8242 35:36 PM 1.11 (H) 0.50 - 1.10 mg/dL Final  14/43/1540 08:67 PM 1.35 (H) 0.50 - 1.10 mg/dL Final    PMH:   Past Medical History:  Diagnosis Date  . Anxiety   . Arthritis   . Colon polyps   . Depression   . Diabetes mellitus without complication (HCC)   . Hepatitis C   . Hyperlipidemia   . Hypertension   . Neuropathy    feet    PSH:   Past Surgical History:  Procedure Laterality Date  . COLONOSCOPY  ?6195,0932?   in [place] (1st colon 20 polyps removed-per pt)  . PARTIAL HYSTERECTOMY  2001    Allergies: No Known Allergies  Medications:   Prior to Admission medications   Medication Sig Start Date End Date Taking? Authorizing Provider  acetaminophen (TYLENOL) 325 MG tablet Take  650 mg by mouth every 12 (twelve) hours as needed for moderate pain, mild pain or headache.   Yes [provider]  amLODipine (NORVASC) 10 MG tablet Take 1 tablet (10 mg total) by mouth every morning. Patient taking differently: Take 10 mg by mouth daily. 01/27/18  Yes Claiborne Rigg, NP  atorvastatin (LIPITOR) 10 MG tablet Take 1 tablet (10 mg total) by mouth daily. 01/27/18  Yes Claiborne Rigg, NP  calcium carbonate (OSCAL) 1500 (600 Ca) MG TABS tablet Take 600 mg of elemental calcium by mouth daily with breakfast.   Yes [provider]  Cholecalciferol (VITAMIN D-3) 125 MCG (5000 UT) TABS Take 5,000 Units by mouth daily.   Yes [provider]  diclofenac Sodium (VOLTAREN) 1 % GEL Apply 2-4 g topically 4 (four) times daily  as needed (for bilateral foot pain).   Yes [provider]  gabapentin (NEURONTIN) 300 MG capsule Take 300 mg by mouth 2 (two) times daily.   Yes [provider]  Glucerna (GLUCERNA) LIQD Take 237 mLs by mouth 2 (two) times daily between meals.   Yes [provider]  melatonin 3 MG TABS tablet Take 3 mg by mouth at bedtime as needed (insomnia).   Yes [provider]  metoprolol succinate (TOPROL-XL) 25 MG 24 hr tablet Take 25 mg by mouth daily.   Yes [provider]  POTASSIUM PO Take 1 tablet by mouth daily.   Yes [provider]  Artificial Saliva (DRY MOUTH SPRAY MT) Use as directed 1 spray in the mouth or throat 6 (six) times daily. Biotene    [provider]  glucose blood test strip Check BS twice daily, 250.00 09/12/14   Ambrose Finland, NP  glucose monitoring kit (FREESTYLE) monitoring kit 1 each by Does not apply route as needed for other. Please check BS twice daily before meals, 250.00 09/12/14   Ambrose Finland, NP  Lancets (FREESTYLE) lancets Check BS twice daily before meals, 250.00 09/12/14   Ambrose Finland, NP  Na Sulfate-K Sulfate-Mg Sulf 17.5-3.13-1.6 GM/177ML SOLN Suprep (no substitutions)-TAKE AS DIRECTED. 11/24/20   Hilarie Fredrickson, MD  potassium chloride (K-DUR) 10 MEQ tablet Take 1 tablet (10 mEq total) by mouth daily. Patient not taking: Reported on 12/02/2020 01/28/18 06/20/29  Claiborne Rigg, NP  glipiZIDE (GLUCOTROL XL) 5 MG 24 hr tablet Take 1 tablet (5 mg total) by mouth daily with breakfast. Patient not taking: No sig reported 01/27/18 06/20/20  Claiborne Rigg, NP    Inpatient medications: . sodium chloride   Intravenous Once  . acetaminophen  1,000 mg Oral Q6H   Or  . acetaminophen (TYLENOL) oral liquid 160 mg/5 mL  1,000 mg Per Tube Q6H  . aspirin EC  325 mg Oral Daily   Or  . aspirin  324 mg Per Tube Daily  . atorvastatin  10 mg Oral Daily  . bisacodyl  10 mg Oral Daily   Or  . bisacodyl  10 mg  Rectal Daily  . chlorhexidine  15 mL Mouth Rinse BID  . Chlorhexidine Gluconate Cloth  6 each Topical Daily  . docusate sodium  200 mg Oral Daily  . enoxaparin (LOVENOX) injection  30 mg Subcutaneous QHS  . gabapentin  300 mg Oral BID  . insulin aspart  0-24 Units Subcutaneous Q4H  . mouth rinse  15 mL Mouth Rinse q12n4p  . metoprolol tartrate  12.5 mg Oral BID   Or  . metoprolol tartrate  12.5 mg Per Tube BID  . [START ON 12/04/2020] pantoprazole  40 mg Oral Daily  . sodium chloride flush  10-40 mL Intracatheter Q12H  . sodium chloride flush  3 mL Intravenous Q12H    Discontinued Meds:   Medications Discontinued During This Encounter  Medication Reason  . EPINEPHrine (ADRENALIN) 4 mg in NS 250 mL (0.016 mg/mL) premix infusion   . norepinephrine (LEVOPHED) 4mg  in premix infusion   . phenylephrine (NEOSYNEPHRINE) 20-0.9 MG/250ML-% infusion   . magnesium sulfate (IV Push/IM) injection 40 mEq   . potassium chloride injection 80 mEq   . heparin 30,000 units/NS 1000 mL solution for CELLSAVER   . heparin sodium (porcine) 2,500 Units, papaverine 30 mg in electrolyte-148 (PLASMALYTE-148) 500 mL irrigation   . tranexamic acid (CYKLOKAPRON) pump prime solution 113 mg   . 0.9 %  sodium chloride infusion (Manually program via Guardrails IV Fluids)   . [place] Blood Cardioplegia vial (lidocaine/magnesium/mannitol 0.26g-4g-6.4g)   . 0.9 % irrigation (POUR BTL)   . heparin sodium (porcine) 2,500 Units, papaverine 30 mg in electrolyte-148 (PLASMALYTE-148) 500 mL irrigation   . hemostatic agents   . nicardipine (CARDENE) 20mg  in 0.86% saline IV infusion (0.1 mg/ml)   . sertraline (ZOLOFT) 50 MG tablet Patient Preference  . Capsaicin 0.1 % CREA Change in therapy  . sertraline (ZOLOFT) tablet 50 mg   . ondansetron (ZOFRAN) injection 4 mg   . metoCLOPramide (REGLAN) injection 10 mg   . milrinone (PRIMACOR) 20 MG/100 ML (0.2 mg/mL) infusion   . nicardipine (CARDENE) 40mg  in 0.83%  saline IV DOUBLE STRENGTH infusion (0.2 mg/ml)   . mupirocin ointment (BACTROBAN) 2 % 1 application   . chlorhexidine gluconate (MEDLINE KIT) (PERIDEX) 0.12 % solution 15 mL   . MEDLINE mouth rinse     Social History:  reports that she has been smoking cigarettes. She has been smoking about 0.25 packs per day. She has never used smokeless tobacco. She reports current alcohol use of about 1.0 standard drink of alcohol per week. She reports previous drug use. Drug: Marijuana.  Family History:   Family History  Problem Relation Age of Onset  . Diabetes Mother   . Colon cancer Neg Hx   . Colon polyps Neg Hx   . Esophageal cancer Neg Hx   . Rectal cancer Neg Hx   . Stomach cancer Neg Hx     Review of systems not obtained due to patient factors. Weight change: 5 kg  Intake/Output Summary (Last 24 hours) at 12/03/2020 1014 Last data filed at 12/03/2020 1000 Gross per 24 hour  Intake 6613.13 ml  Output 1190 ml  Net 5423.13 ml   BP 131/65   Pulse 88   Temp 97.7 F (36.5 C)   Resp (!) 31   Ht 5\' 8"  (1.727 m)   Wt 67 kg   SpO2 96%   BMI 22.46 kg/m  Vitals:   12/03/20 0930 12/03/20 0944 12/03/20 0945 12/03/20 1000  BP:    131/65  Pulse: 80 88 92 88  Resp: (!) 22 (!) 30 (!) 39 (!) 31  Temp: 97.7 F (36.5 C) 97.7 F (36.5 C) 97.7 F (36.5 C) 97.7 F (36.5 C)  TempSrc:      SpO2: 95% 99% 93% 96%  Weight:      Height:         General appearance: alert, cooperative and intubated Head: Normocephalic, without obvious abnormality, atraumatic Resp: clear to auscultation bilaterally Cardio: regular rate and rhythm,  S1, S2 normal, no murmur, click, rub or gallop GI: soft, non-tender; bowel sounds normal; no masses,  no organomegaly Extremities: extremities normal, atraumatic, no cyanosis or edema  Labs: Basic Metabolic Panel: Recent Labs  Lab 11/27/20 1544 12/01/20 2223 12/02/20 0550 12/02/20 0903 12/02/20 0908 12/02/20 1035 12/02/20 1056 12/02/20 1209  12/02/20 1245 12/02/20 1322 12/02/20 1358 12/02/20 1402 12/02/20 1532 12/02/20 2201 12/03/20 0407  NA 138 141  --  142   < > 143   < > 141 143 141 142 142 145 138 142  K 3.3* 3.0*  --  3.2*   < > 2.9*   < > 3.1* 2.9* 4.1 3.6 3.6 3.2* 2.8* 3.6  CL 100 104  --  108  --  110  --  102  --  103  --  106  --  104 109  CO2 24 23  --   --   --   --   --   --   --   --   --   --   --  18* 20*  GLUCOSE 98 254*  --  117*  --  112*  --  172*  --  188*  --  167*  --  202* 104*  BUN 32* 37*  --  36*  --  32*  --  37*  --  37*  --  35*  --  34* 35*  CREATININE 2.43* 2.52*  --  2.50*  --  2.10*  --  2.30*  --  2.20*  --  2.00*  --  2.86* 3.14*  ALBUMIN  --   --  3.5  --   --   --   --   --   --   --   --   --   --   --   --   CALCIUM 9.4 9.1  --   --   --   --   --   --   --   --   --   --   --  7.5* 7.9*   < > = values in this interval not displayed.   Liver Function Tests: Recent Labs  Lab 12/02/20 0550  AST 17  ALT 13  ALKPHOS 63  BILITOT 0.4  PROT 6.8  ALBUMIN 3.5   Recent Labs  Lab 12/02/20 0550  LIPASE 21   No results for input(s): AMMONIA in the last 168 hours. CBC: Recent Labs  Lab 12/01/20 2223 12/02/20 0903 12/02/20 1241 12/02/20 1245 12/02/20 1530 12/02/20 1532 12/02/20 2201 12/03/20 0407  WBC 14.5*  --   --   --  14.6*  --  19.1* 17.1*  HGB 10.8*   < > 7.9*   < > 13.5 13.6 13.8 13.2  HCT 34.0*   < > 23.1*   < > 39.8 40.0 41.4 39.0  MCV 98.6  --   --   --  91.7  --  92.4 91.8  PLT 183  --  53*  --  80*  --  120* 101*   < > = values in this interval not displayed.   PT/INR: @LABRCNTIP (inr:5) Cardiac Enzymes: )No results for input(s): CKTOTAL, CKMB, CKMBINDEX, TROPONINI in the last 168 hours. CBG: Recent Labs  Lab 12/03/20 0043 12/03/20 0148 12/03/20 0312 12/03/20 0405 12/03/20 0756  GLUCAP 194* 173* 121* 108* 111*    Iron Studies: No results for input(s): IRON, TIBC, TRANSFERRIN, FERRITIN in the last 168 hours.  Xrays/Other Studies: DG Chest  2  View  Result Date: 12/01/2020 CLINICAL DATA:  Left chest pain, shortness of breath for 1 week EXAM: CHEST - 2 VIEW COMPARISON:  11/27/2020 FINDINGS: Frontal and lateral views of the chest demonstrates stable enlargement the cardiac silhouette. Marked ectasia of the thoracic aorta is stable. No acute airspace disease, effusion, or pneumothorax. There are no acute bony abnormalities. IMPRESSION: 1. Stable enlargement of the cardiac silhouette. 2. Stable thoracic aortic ectasia. 3. No acute airspace disease. Electronically Signed   By: Sharlet Salina M.D.   On: 12/01/2020 22:41   DG Chest Port 1 View  Result Date: 12/03/2020 CLINICAL DATA:  Post ascending aorta replacement EXAM: PORTABLE CHEST 1 VIEW COMPARISON:  12/02/2020 FINDINGS: Endotracheal tube, partially imaged enteric tube, right IJ Swan-Ganz catheter, and mediastinal drain again identified. No pneumothorax. Increased density at the right lung base likely reflecting combination of pleural effusion and atelectasis. Increased retrocardiac atelectasis. Probable mild pulmonary edema is similar. Stable cardiomediastinal contours. IMPRESSION: Lines and tubes as above. Increased basilar atelectasis and new right pleural effusion. Similar probable mild pulmonary edema. Electronically Signed   By: Guadlupe Spanish M.D.   On: 12/03/2020 08:15   DG Chest Port 1 View  Result Date: 12/02/2020 CLINICAL DATA:  Aortic dissection repair EXAM: PORTABLE CHEST 1 VIEW COMPARISON:  12/01/2020 FINDINGS: Single frontal view of the chest demonstrates stable enlarged cardiac silhouette. Postsurgical changes are seen from median sternotomy and aortic dissection repair. Continued ectasia of the thoracic aorta. Endotracheal tube overlies tracheal air column, tip well above carina. Enteric catheter tip and side port project over the gastric fundus. Flow directed right internal jugular central venous catheter tip overlies main pulmonary outflow tract. Mediastinal drains are  identified. Bilateral interstitial and ground-glass opacities consistent with mild edema. Trace fluid seen at the apices. No evidence of pneumothorax. IMPRESSION: 1. Postsurgical changes from aortic repair. Support devices as above. 2. Mild pulmonary edema. 3. Trace fluid at the apices likely related to thoracotomy. Electronically Signed   By: Sharlet Salina M.D.   On: 12/02/2020 15:51   ECHO INTRAOPERATIVE TEE  Result Date: 12/02/2020  *INTRAOPERATIVE TRANSESOPHAGEAL REPORT *  Patient Name:   Mary Stephens Date of Exam: 12/02/2020 Medical Rec #:  [id number]      Height:       68.0 in Accession #:    [id number]     Weight:       125.0 lb Date of Birth:  [birth year]      BSA:          1.67 m Patient Age:    64 years       BP:           102/77 mmHg Patient Gender: F              HR:           56 bpm. Exam Location:  Anesthesiology Transesophogeal exam was perform intraoperatively during surgical procedure. Patient was closely monitored under general anesthesia during the entirety of examination. Indications:     Aortic dissection Sonographer:     Lavenia Atlas RDCS Performing Phys: [id number] Eliezer Lofts LIGHTFOOT Diagnosing Phys: Leslye Peer MD Complications: No known complications during this procedure. POST-OP IMPRESSIONS - Left Ventricle: The left ventricle is unchanged from pre-bypass. - Right Ventricle: The right ventricle appears unchanged from pre-bypass. - Aorta: A graft was placed in the ascending aorta for repair. - Left Atrium: The left atrium appears unchanged from pre-bypass. - Left Atrial Appendage: The left atrial appendage  appears unchanged from pre-bypass. - Aortic Valve: There is no regurgitation. - Mitral Valve: There is mild regurgitation. - Tricuspid Valve: The tricuspid valve appears unchanged from pre-bypass. - Interatrial Septum: The interatrial septum appears unchanged from pre-bypass. - Interventricular Septum: The interventricular septum appears unchanged from pre-bypass. PRE-OP  FINDINGS  Left Ventricle: The left ventricle has low normal systolic function, with an ejection fraction of 50-55%. The cavity size was normal. There is moderate concentric left ventricular hypertrophy. No evidence of left ventricular regional wall motion abnormalities. Right Ventricle: The right ventricle has normal systolic function. The cavity was normal. There is no increase in right ventricular wall thickness. Left Atrium: Left atrial size was normal in size. Right Atrium: Right atrial size was normal in size. Interatrial Septum: The interatrial septum was not assessed. Pericardium: A moderately sized pericardial effusion is present. The pericardial effusion is circumferential. There is no evidence of cardiac tamponade. Mitral Valve: The mitral valve is normal in structure. Mitral valve regurgitation is mild to moderate by color flow Doppler. There is No evidence of mitral stenosis. Tricuspid Valve: The tricuspid valve was normal in structure. Tricuspid valve regurgitation is trivial by color flow Doppler. Aortic Valve: The aortic valve is tricuspid Aortic valve regurgitation is mild to moderate by color flow Doppler. There is no evidence of aortic valve stenosis. Pulmonic Valve: The pulmonic valve was normal in structure. Pulmonic valve regurgitation is trivial by color flow Doppler. Aorta: There is evidence of a dissection in the ascending aorta and aortic arch. The dissection can be classified as a Stanford type A (proximal). The dissection entry point is at the ascending aorta. +------------+--------++ AORTIC VALVE         +------------+--------++ AR PHT:     547 msec +------------+--------++  Leslye Peer MD Electronically signed by Leslye Peer MD Signature Date/Time: 12/02/2020/2:49:47 PM    Final    CT Angio Chest/Abd/Pel for Dissection W and/or Wo Contrast  Result Date: 12/02/2020 CLINICAL DATA:  Concern for aortic dissection, chest pain radiating posteriorly, cold pulseless right leg.  EXAM: CT ANGIOGRAPHY CHEST, ABDOMEN AND PELVIS TECHNIQUE: Non-contrast CT of the chest was initially obtained. Multidetector CT imaging through the chest, abdomen and pelvis was performed using the standard protocol during bolus administration of intravenous contrast. Multiplanar reconstructed images and MIPs were obtained and reviewed to evaluate the vascular anatomy. CONTRAST:  80mL OMNIPAQUE IOHEXOL 350 MG/ML SOLN COMPARISON:  Radiograph 12/01/2020 FINDINGS: CTA CHEST FINDINGS Cardiovascular: Type A aortic dissection with dissection flap free edge seen in the mid ascending aorta (10/46). Dissection flap extends into the aortic arch and branch vessels involving the shared origin of the brachiocephalic and left common carotid arteries with attenuation and abrupt tapering of the right internal carotid artery arising from what appears to be the false lumen while the right subclavian artery is largely supplied by the true lumen. The left internal carotid is supplied by true lumen and normally opacified. Dissection flap extends into the proximal left subclavian artery as well though the more distal vessel and vertebral artery do appear supplied by true lumen. Dissection flap extends inferiorly throughout the descending aorta and into the abdomen as described below. There is a moderate low-attenuation pericardial effusion. Cardiomegaly with three-vessel coronary artery atherosclerosis. Central pulmonary arteries are normal caliber. No large central pulmonary arterial filling defects. Mediastinum/Nodes: No mediastinal fluid or gas. Normal thyroid gland and thoracic inlet. No acute abnormality of the trachea or esophagus. No worrisome mediastinal, hilar or axillary adenopathy. Lungs/Pleura: Atelectatic changes are present in  the lungs. More bandlike areas of opacity in the left lung base, likely scarring or subsegmental atelectatic change. Trace bilateral effusions. No consolidative opacities. No concerning pulmonary  nodules or masses. Musculoskeletal: Degenerative changes are present in the imaged spine and shoulders. No acute osseous abnormality or suspicious osseous lesion. No worrisome chest wall lesions. Review of the MIP images confirms the above findings. CTA ABDOMEN AND PELVIS FINDINGS VASCULAR Aorta: Propagation of the aortic dissection flap throughout the extent of the abdominal and beyond the bifurcation. The smaller lumen is likely the true lumen being compressed by an expanding false lumen, nearly effacing the true lumen at the level of the infrarenal abdominal aorta. Celiac: Arising from the true lumen. 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 No proximal occlusions or other aneurysmal dilatation. No acute luminal abnormality of the vessel or proximal branches. SMA: Dissection flap propagates into the proximal SMA though more distal branches appear normally opacified and normally branching. Renals: Single renal arteries bilaterally. The left renal artery appears to arise from the false lumen and abruptly tapers (7/159) with absent enhancement of much of the renal parenchyma save a small amount of preserved upper pole enhancement supplied by a tiny accessory renal artery. Right renal artery appears to arise from the true lumen and is normally opacified without acute luminal abnormality. IMA: Origin of the IMA is not opacified possibly arising from the false lumen. Opacification of the distal branches, possibly supplied by left colic collateral flow. Inflow: Dissection flap extends beyond the aortic bifurcation with abrupt tapering and possible intramural thrombus within the right common iliac artery and attenuated opacification of the right internal and external iliac branches as well as the proximal outflow vessels. Dissection flap propagates into the left common iliac and external iliac artery. The left internal iliac artery appears supplied by the true lumen. Dissection  continues into the proximal left outflow vessels likely terminating by the level of the left common femoral artery. Normal opacification of the deep and superficial arteries. Veins: Kummel markedly suboptimal venous opacification on this arterial phase exam. No gross abnormality is seen. Review of the MIP images confirms the above findings. NON-VASCULAR Hepatobiliary: No worrisome focal liver lesions. Smooth liver surface contour. Normal hepatic attenuation. Gallbladder partially decompressed at the time of exam. Small calcified gallstone layering towards the gallbladder fundus. No pericholecystic fluid or inflammation. No biliary ductal dilatation. Pancreas: No pancreatic ductal dilatation or surrounding inflammatory changes. Spleen: Normal in size. No concerning splenic lesions., heterogeneous enhancement of the spleen is normal for the arterial phase of imaging. Adrenals/Urinary Tract: Lobular thickening of the bilateral adrenal glands without discrete nodules. The majority of the left renal parenchyma fails to enhance with only partial sparing of the cortex in the upper poles which appears supplied by a tiny accessory renal artery. More normal right renal enhancement. Multiple fluid attenuation cysts are present in both kidneys. An additional hyperdense indeterminate 9 mm focus is noted along the over lower pole of the left kidney (7/167). No urolithiasis or hydronephrosis. Urinary bladder is largely decompressed at the time of exam and therefore poorly evaluated by CT imaging. No gross bladder abnormality. Stomach/Bowel: Distal esophagus, stomach and duodenum are unremarkable. No small bowel or colonic thickening or dilatation. Fairly uniform enhancement of the bowel though suboptimally assessed on this single-phase study. No evidence of obstruction Appendix is not visualized. No focal inflammation the vicinity of the cecum to suggest an occult appendicitis. Lymphatic: No suspicious or enlarged lymph nodes in  the included lymphatic chains. Reproductive: Insert patient appears to be post hysterectomy. No concerning adnexal lesions. Other: No abdominopelvic free fluid or free gas. No bowel containing hernias. Musculoskeletal: No acute osseous abnormality or suspicious osseous lesion. Multilevel degenerative changes are present in the imaged portions of the spine. Additional degenerative changes in the hips and pelvis. Musculature is unremarkable. Review of the MIP images confirms the above findings. IMPRESSION: Vascular: 1. 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 2. Developing low-attenuation pericardial effusion. Consider echocardiography to exclude features of tamponade. 3. Dissection flap extends into the aortic arch and branch vessels involving the shared origin of the brachiocephalic and left common carotid arteries and the proximal left subclavian artery. Abrupt tapering of the right internal carotid artery arising from what appears to be the false lumen. The right subclavian artery appears largely supplied by the true lumen though somewhat attenuated possibly secondary to luminal narrowing or developing thrombus. Remaining proximal great vessels are more normally opacified. 4. Extension of the dissection flap through the abdominal aorta into the left inflow and proximal outflow vessels, terminating at the level of the left common femoral artery. 5. Dissection flap propagates into the proximal SMA though more distal branches appear normally opacified. 6. Left renal artery arises from the false lumen with infarct of the majority of the left renal parenchyma only a small portion of the upper pole spared by a tiny accessory renal artery. 7. Attenuation of the IMA origin, distal branch opacification likely supplied via collateral. 8. Abrupt tapering within the proximal right common iliac artery and attenuated opacification of the more distal inflow and proximal outflow  vessels. Nonvascular: 1. Trace bilateral effusions. Bibasilar atelectasis and left basilar scarring. 2. Cholelithiasis without evidence of acute cholecystitis. 3. Prior hysterectomy. 4. Indeterminate 9 mm hyperdense focus along the over lower pole of the left kidney. Could reflect a hyperdense cyst versus solid lesion. Recommend further evaluation with nonemergent renal ultrasound on a nonemergent basis. Critical Value/emergent results were called by telephone at the time of discovery on 12/02/2020 at 6:30 AM to provider Zadie Rhine , who verbally acknowledged these results. Electronically Signed   By: Kreg Shropshire M.D.   On: 12/02/2020 07:05     Assessment/Plan: 1.  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  Pt was also hypotensive and received IV contrast with emergent CT angio.  She has advanced CKD at baseline and unclear how much renal function she will recover. 1. Avoid any further nephrotoxic agents (ie. IV contrast, NSAIDs, COX-II I's, phosphate containing enemas) 2. Renal dose meds 3. No urgent indication for dialysis at this time and will continue to follow UOP and daily Scr 4. Consider renal artery duplex to evaluate flow to left kidney following surgery. 2. Type A aortic dissection- s/p repair.  Improved lower extremity pulses.  Recovering well. 3. HTN- stable with metoprolol and titrate dose as required.  Milrinone per CTS. 4. Hypokalemia- will replete carefully given AKI and oliguria 5. DM- per primary. 6. Thrombocytopenia- stable 7. Tobacco use- per primary   Jomarie Longs A Latricia Cerrito 12/03/2020, 10:14 AM

## 2020-12-03 NOTE — Procedures (Signed)
Extubation Procedure Note  Patient Details:   Name: Mary Stephens DOB: 1956/09/21 MRN: [id number]   Airway Documentation:    Vent end date: 12/03/20 Vent end time: 0944   Evaluation  O2 sats: stable throughout Complications: No apparent complications Patient did tolerate procedure well. Bilateral Breath Sounds: Clear,Diminished   Yes   Pt extubated per Dr. Kipp Brood and Rapid Wean SIMV protocol. Positive cuff leak heard prior to extubation.  Esperanza Sheets T 12/03/2020, 9:47 AM

## 2020-12-03 NOTE — Progress Notes (Signed)
Heart wean protocol. NIF: -30 VC: 750mL

## 2020-12-03 NOTE — Progress Notes (Addendum)
  Progress Note    12/03/2020 7:35 AM Hospital Day 1  Subjective:  intubated   Vitals:   12/03/20 0500 12/03/20 0600  BP: (!) 134/95 (!) 89/60  Pulse: 78 82  Resp: 17 18  Temp: 98.42 F (36.9 C) 97.7 F (36.5 C)  SpO2: 97% 92%    Physical Exam: Cardiac:  regular Lungs:  intubated Extremities:  Easily palpable PT pulses bilaterally and bilateral calves are soft.   CBC    Component Value Date/Time   WBC 17.1 (H) 12/03/2020 0407   RBC 4.25 12/03/2020 0407   HGB 13.2 12/03/2020 0407   HGB 13.9 01/27/2018 1518   HCT 39.0 12/03/2020 0407   HCT 41.1 01/27/2018 1518   PLT 101 (L) 12/03/2020 0407   PLT 250 01/27/2018 1518   MCV 91.8 12/03/2020 0407   MCV 94 01/27/2018 1518   MCH 31.1 12/03/2020 0407   MCHC 33.8 12/03/2020 0407   RDW 14.2 12/03/2020 0407   RDW 13.2 01/27/2018 1518   LYMPHSABS 4.1 (H) 08/26/2016 1908   MONOABS 0.8 08/26/2016 1908   EOSABS 0.1 08/26/2016 1908   BASOSABS 0.0 08/26/2016 1908    BMET    Component Value Date/Time   NA 142 12/03/2020 0407   NA 148 (H) 01/27/2018 1518   K 3.6 12/03/2020 0407   CL 109 12/03/2020 0407   CO2 20 (L) 12/03/2020 0407   GLUCOSE 104 (H) 12/03/2020 0407   BUN 35 (H) 12/03/2020 0407   BUN 21 01/27/2018 1518   CREATININE 3.14 (H) 12/03/2020 0407   CREATININE 1.21 (H) 10/20/2016 1037   CALCIUM 7.9 (L) 12/03/2020 0407   GFRNONAA 16 (L) 12/03/2020 0407   GFRNONAA 48 (L) 10/20/2016 1037   GFRAA 41 (L) 01/27/2018 1518   GFRAA 55 (L) 10/20/2016 1037    INR    Component Value Date/Time   INR 1.6 (H) 12/02/2020 1530     Intake/Output Summary (Last 24 hours) at 12/03/2020 0735 Last data filed at 12/03/2020 0600 Gross per 24 hour  Intake 6714.93 ml  Output 1150 ml  Net 5564.93 ml     Assessment/Plan:  64 y.o. female who is s/p repair type A aortic dissection Hospital Day 1  -pt with easily palpable PT pulses bilaterally this morning.  Bilateral calves are soft.  -pt management per CT  surgery  Leontine Locket, PA-C [facility] [phone] 12/03/2020 7:35 AM  I have interviewed the patient and examined the patient. I agree with the findings by the PA. Vascular surgery will be available as needed.  Gae Gallop, MD [phone]

## 2020-12-03 NOTE — Addendum Note (Signed)
Addendum  created 12/03/20 0815 by Josephine Igo, CRNA   Order list changed, Pharmacy for encounter modified

## 2020-12-04 ENCOUNTER — Inpatient Hospital Stay (HOSPITAL_COMMUNITY): Payer: Medicare (Managed Care)

## 2020-12-04 LAB — CBC
HCT: 35.5 % — ABNORMAL LOW (ref 36.0–46.0)
Hemoglobin: 12.2 g/dL (ref 12.0–15.0)
MCH: 31.2 pg (ref 26.0–34.0)
MCHC: 34.4 g/dL (ref 30.0–36.0)
MCV: 90.8 fL (ref 80.0–100.0)
Platelets: 80 10*3/uL — ABNORMAL LOW (ref 150–400)
RBC: 3.91 MIL/uL (ref 3.87–5.11)
RDW: 14.4 % (ref 11.5–15.5)
WBC: 17.7 10*3/uL — ABNORMAL HIGH (ref 4.0–10.5)
nRBC: 0 % (ref 0.0–0.2)

## 2020-12-04 LAB — POCT I-STAT 7, (LYTES, BLD GAS, ICA,H+H)
Acid-base deficit: 4 mmol/L — ABNORMAL HIGH (ref 0.0–2.0)
Acid-base deficit: 5 mmol/L — ABNORMAL HIGH (ref 0.0–2.0)
Bicarbonate: 19.2 mmol/L — ABNORMAL LOW (ref 20.0–28.0)
Bicarbonate: 20.3 mmol/L (ref 20.0–28.0)
Calcium, Ion: 1.15 mmol/L (ref 1.15–1.40)
Calcium, Ion: 1.17 mmol/L (ref 1.15–1.40)
HCT: 33 % — ABNORMAL LOW (ref 36.0–46.0)
HCT: 35 % — ABNORMAL LOW (ref 36.0–46.0)
Hemoglobin: 11.2 g/dL — ABNORMAL LOW (ref 12.0–15.0)
Hemoglobin: 11.9 g/dL — ABNORMAL LOW (ref 12.0–15.0)
O2 Saturation: 83 %
O2 Saturation: 91 %
Patient temperature: 97.7
Patient temperature: 97.7
Potassium: 3.6 mmol/L (ref 3.5–5.1)
Potassium: 3.6 mmol/L (ref 3.5–5.1)
Sodium: 143 mmol/L (ref 135–145)
Sodium: 143 mmol/L (ref 135–145)
TCO2: 20 mmol/L — ABNORMAL LOW (ref 22–32)
TCO2: 21 mmol/L — ABNORMAL LOW (ref 22–32)
pCO2 arterial: 31.2 mmHg — ABNORMAL LOW (ref 32.0–48.0)
pCO2 arterial: 32.6 mmHg (ref 32.0–48.0)
pH, Arterial: 7.396 (ref 7.350–7.450)
pH, Arterial: 7.4 (ref 7.350–7.450)
pO2, Arterial: 46 mmHg — ABNORMAL LOW (ref 83.0–108.0)
pO2, Arterial: 58 mmHg — ABNORMAL LOW (ref 83.0–108.0)

## 2020-12-04 LAB — BASIC METABOLIC PANEL
Anion gap: 14 (ref 5–15)
BUN: 39 mg/dL — ABNORMAL HIGH (ref 8–23)
CO2: 19 mmol/L — ABNORMAL LOW (ref 22–32)
Calcium: 8.2 mg/dL — ABNORMAL LOW (ref 8.9–10.3)
Chloride: 109 mmol/L (ref 98–111)
Creatinine, Ser: 3.59 mg/dL — ABNORMAL HIGH (ref 0.44–1.00)
GFR, Estimated: 13 mL/min — ABNORMAL LOW (ref >60)
Glucose, Bld: 76 mg/dL (ref 70–99)
Potassium: 3.7 mmol/L (ref 3.5–5.1)
Sodium: 142 mmol/L (ref 135–145)

## 2020-12-04 LAB — GLUCOSE, CAPILLARY
Glucose-Capillary: 102 mg/dL — ABNORMAL HIGH (ref 70–99)
Glucose-Capillary: 118 mg/dL — ABNORMAL HIGH (ref 70–99)
Glucose-Capillary: 121 mg/dL — ABNORMAL HIGH (ref 70–99)
Glucose-Capillary: 130 mg/dL — ABNORMAL HIGH (ref 70–99)
Glucose-Capillary: 90 mg/dL (ref 70–99)
Glucose-Capillary: 99 mg/dL (ref 70–99)

## 2020-12-04 LAB — COOXEMETRY PANEL
Carboxyhemoglobin: 0.7 % (ref 0.5–1.5)
Methemoglobin: 1 % (ref 0.0–1.5)
O2 Saturation: 62.8 %
Total hemoglobin: 12 g/dL (ref 12.0–16.0)

## 2020-12-04 MED ORDER — GUAIFENESIN 100 MG/5ML PO SOLN: 5.0000 mL | ORAL | Status: DC | PRN

## 2020-12-04 MED ORDER — SODIUM BICARBONATE 650 MG PO TABS
650.0000 mg | ORAL_TABLET | Freq: Two times a day (BID) | ORAL | Status: DC
  Administered 2020-12-04 – 2020-12-12 (×15): 650 mg via ORAL
  Filled 2020-12-04 (×17): qty 1

## 2020-12-04 MED ORDER — FUROSEMIDE 10 MG/ML IJ SOLN
40.0000 mg | Freq: Once | INTRAMUSCULAR | Status: AC
  Administered 2020-12-04: 16:00:00 40 mg via INTRAVENOUS
  Filled 2020-12-04: qty 4

## 2020-12-04 MED ORDER — HYDRALAZINE HCL 20 MG/ML IJ SOLN
10.0000 mg | Freq: Four times a day (QID) | INTRAMUSCULAR | Status: DC | PRN
  Administered 2020-12-05 – 2020-12-09 (×6): 10 mg via INTRAVENOUS
  Filled 2020-12-04 (×6): qty 1

## 2020-12-04 MED ORDER — AMLODIPINE BESYLATE 10 MG PO TABS
10.0000 mg | ORAL_TABLET | Freq: Every day | ORAL | Status: DC
  Administered 2020-12-05 – 2020-12-12 (×8): 10 mg via ORAL
  Filled 2020-12-04 (×8): qty 1

## 2020-12-04 MED ORDER — POTASSIUM CHLORIDE CRYS ER 20 MEQ PO TBCR
20.0000 meq | EXTENDED_RELEASE_TABLET | Freq: Two times a day (BID) | ORAL | Status: AC
  Administered 2020-12-04 – 2020-12-05 (×4): 20 meq via ORAL
  Filled 2020-12-04 (×6): qty 1

## 2020-12-04 MED ORDER — AMLODIPINE BESYLATE 5 MG PO TABS
5.0000 mg | ORAL_TABLET | Freq: Every day | ORAL | Status: DC
  Administered 2020-12-04: 09:00:00 5 mg via ORAL
  Filled 2020-12-04: qty 1

## 2020-12-04 MED FILL — Mannitol IV Soln 20%: INTRAVENOUS | Qty: 500 | Status: AC

## 2020-12-04 MED FILL — Sodium Bicarbonate IV Soln 8.4%: INTRAVENOUS | Qty: 100 | Status: AC

## 2020-12-04 MED FILL — Electrolyte-R (PH 7.4) Solution: INTRAVENOUS | Qty: 5000 | Status: AC

## 2020-12-04 MED FILL — Calcium Chloride Inj 10%: INTRAVENOUS | Qty: 10 | Status: AC

## 2020-12-04 MED FILL — Sodium Chloride IV Soln 0.9%: INTRAVENOUS | Qty: 3000 | Status: AC

## 2020-12-04 MED FILL — Heparin Sodium (Porcine) Inj 1000 Unit/ML: INTRAMUSCULAR | Qty: 10 | Status: AC

## 2020-12-04 NOTE — Progress Notes (Signed)
Upon arrival to patient room for AM assessment, RT noted that high flow nasal cannula was out of patient's nose and sats were reading in low 80s.  Placed cannula back in nose and sats were slowly starting to improve.  Will continue to monitor.

## 2020-12-04 NOTE — Progress Notes (Signed)
PCCM progress note  Consulted by cardiothoracic surgery APP for thoracentesis given bilateral pleural effusions and worsening oxygenation. Patient has worsening renal function in thew setting of repair of asending thoracic aortic dissection and use of contrast dye. Therefore use of diuretics to control pleural effusion is limited.  Initially patient agreed for procedure and allow for bedside ultrasound to be performed. There is a larger left pocket on ultrasound compared to right. Plan was to perform thoracentesis on left. However on reassessment patient states that " I do not want this procedure now, my family does not know I'm having this procedure and they don't know what it is. I want to talk to them first". Patient was educated on possible complications if thoracentesis is not completed including but not limited to worsening respiratory distress resulting in need mechanical ventilation, she verbalized understanding.  I will notify Dr. Barbaraann Rondo of patient's refusal of procedure.   Johnsie Cancel, NP-C [facility] Contact / Pager information can be found on Amion  12/04/2020, 1:58 PM

## 2020-12-04 NOTE — Progress Notes (Signed)
      [address]       [place] [address]             [phone]                 2 Days Post-Op Procedure(s) (LRB): REPAIR OF ACUTE ASCENDING THORACIC AORTIC DISSECTION VIA CIRC ARREST USING HEMASHIELD PLATINUM 28 MM VASCULAR GRAFT. (N/A) TRANSESOPHAGEAL ECHOCARDIOGRAM (TEE)   Events: Awake desatting _______________________________________________________________ Vitals: BP 126/69 (BP Location: Right Arm)   Pulse 72   Temp 98.2 F (36.8 C)   Resp 20   Ht 5\' 8"  (1.727 m)   Wt 65.4 kg   SpO2 (!) 86%   BMI 21.92 kg/m   - Neuro: alert NAD  - Cardiovascular: sinus   PAP: (27-29)/(9-10) 29/10  - Pulm: on and off NRB.  EWOB FiO2 (%):  [100 %] 100 %  ABG    Component Value Date/Time   PHART 7.400 12/04/2020 0214   PCO2ART 32.6 12/04/2020 0214   PO2ART 58 (L) 12/04/2020 0214   HCO3 20.3 12/04/2020 0214   TCO2 21 (L) 12/04/2020 0214   ACIDBASEDEF 4.0 (H) 12/04/2020 0214   O2SAT 62.8 12/04/2020 0227    - Abd: abd soft - Extremity: warm  .Intake/Output      12/22 0701 12/23 0700 12/23 0701 12/24 0700   I.V. (mL/kg) 1254.2 (19.2)    Blood     NG/GT     IV Piggyback 200    Total Intake(mL/kg) 1454.2 (22.2)    Urine (mL/kg/hr) 875 (0.6)    Emesis/NG output     Chest Tube 300    Total Output 1175    Net +279.2            _______________________________________________________________ Labs: CBC Latest Ref Rng & Units 12/04/2020 12/04/2020 12/04/2020  WBC 4.0 - 10.5 K/uL 17.7(H) - -  Hemoglobin 12.0 - 15.0 g/dL 12.2 11.2(L) 11.9(L)  Hematocrit 36.0 - 46.0 % 35.5(L) 33.0(L) 35.0(L)  Platelets 150 - 400 K/uL 80(L) - -   CMP Latest Ref Rng & Units 12/04/2020 12/04/2020 12/04/2020  Glucose 70 - 99 mg/dL 76 - -  BUN 8 - 23 mg/dL 39(H) - -  Creatinine 0.44 - 1.00 mg/dL 3.59(H) - -  Sodium 135 - 145 mmol/L 142 143 143  Potassium 3.5 - 5.1 mmol/L 3.7 3.6 3.6  Chloride 98 - 111 mmol/L 109 - -  CO2 22 - 32 mmol/L 19(L) - -  Calcium 8.9 -  10.3 mg/dL 8.2(L) - -  Total Protein 6.5 - 8.1 g/dL - - -  Total Bilirubin 0.3 - 1.2 mg/dL - - -  Alkaline Phos 38 - 126 U/L - - -  AST 15 - 41 U/L - - -  ALT 0 - 44 U/L - - -    CXR: B effusion  _______________________________________________________________  Assessment and Plan: POD 3 s/p type A dissection repair  Neuro: pain controlled CV: off gtts.  titrating BB.  Added amlodipine Pulm: discussed bedside thoracentesis with pulm  Pt has refused.  Will re-address after speaking with daughter Renal: making some urine.  Creat pla[place]au.  Nephrology on board.  Will give dose of lasix. GI: on diet Heme: stable ID: [email or web address] Endo: SSI Dispo: continue ICU care  Melodie Bouillon, MD 12/04/2020 1:43 PM

## 2020-12-04 NOTE — Progress Notes (Signed)
MD made aware of increased need of O2 with pt's SATs remaining in 80s.

## 2020-12-04 NOTE — Progress Notes (Signed)
Subjective:  Kidney function labs fairly stable the last 24 hours- 695 UOP recorded last 24 hours -  Has developed some resp distress/hypoxia overnight but says she feels better   Objective Vital signs in last 24 hours: Vitals:   12/04/20 0415 12/04/20 0430 12/04/20 0445 12/04/20 0500  BP: 99/83 116/74 103/71 137/77  Pulse: 75 74 74 71  Resp: 19 17 18 17   Temp:      TempSrc:      SpO2: 97% 100% 100% 100%  Weight:    65.4 kg  Height:       Weight change: -1.6 kg  Intake/Output Summary (Last 24 hours) at 12/04/2020 0703 Last data filed at 12/04/2020 0500 Gross per 24 hour  Intake 1362.94 ml  Output 925 ml  Net 437.94 ml    Assessment/Plan: 1.  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  Pt was also hypotensive and received IV contrast with emergent CT angio.  She has advanced CKD at baseline and unclear how much renal function she will recover.  1. No urgent indication for dialysis at this time and will continue to follow UOP and daily Scr 2. Consider renal artery duplex to evaluate flow to left kidney following surgery. 2. Type A aortic dissection- s/p repair.  Improved lower extremity pulses.  Recovering well. 3. HTN- A line reads higher than cuff-  required nicardipine  And metoprolol overnight -  Balancing act keeping BP down but not too low.  Volume does not appear excessive  4. Hypokalemia- will replete carefully given AKI and oliguria- give 40 po today and tomorrow  5. DM- per primary. 6. Thrombocytopenia- stable 7. Tobacco use- may explain some of the hypoxia  8. Metabolic acidosis-  Start oral bicarb    Louis Meckel    Labs: Basic Metabolic Panel: Recent Labs  Lab 12/03/20 0407 12/03/20 0829 12/03/20 0916 12/03/20 1604 12/03/20 2152 12/04/20 0001 12/04/20 0214 12/04/20 0227  NA 142  --    < > 142   < >  143 143 142  K 3.6  --    < > 3.6   < > 3.6 3.6 3.7  CL 109  --   --  108  --   --   --  109  CO2 20*  --   --  19*  --   --   --  19*  GLUCOSE 104*  --   --  135*  --   --   --  76  BUN 35*  --   --  37*  --   --   --  39*  CREATININE 3.14* 3.41*  --  3.53*  --   --   --  3.59*  CALCIUM 7.9*  --   --  8.2*  --   --   --  8.2*   < > = values in this interval not displayed.   Liver Function Tests: Recent Labs  Lab 12/02/20 0550  AST 17  ALT 13  ALKPHOS 63  BILITOT 0.4  PROT 6.8  ALBUMIN 3.5   Recent Labs  Lab 12/02/20 0550  LIPASE 21   No results for input(s): AMMONIA in the last 168 hours. CBC: Recent Labs  Lab 12/02/20 2201 12/03/20 0406 12/03/20 0407 12/03/20 3295 12/03/20 1884 12/03/20 1604 12/03/20 2152 12/04/20 0001 12/04/20 0214 12/04/20 1660  WBC 19.1*  --  17.1* 18.0*  --  17.9*  --   --   --  17.7*  HGB 13.8   < > 13.2 12.9   < > 12.6   < > 11.9* 11.2* 12.2  HCT 41.4   < > 39.0 37.9   < > 34.8*   < > 35.0* 33.0* 35.5*  MCV 92.4  --  91.8 90.5  --  88.8  --   --   --  90.8  PLT 120*  --  101* 94*  --  89*  --   --   --  80*   < > = values in this interval not displayed.   Cardiac Enzymes: No results for input(s): CKTOTAL, CKMB, CKMBINDEX, TROPONINI in the last 168 hours. CBG: Recent Labs  Lab 12/03/20 1115 12/03/20 1535 12/03/20 1937 12/03/20 2343 12/04/20 0405  GLUCAP 118* 127* 133* 121* 90    Iron Studies: No results for input(s): IRON, TIBC, TRANSFERRIN, FERRITIN in the last 72 hours. Studies/Results: DG Chest Port 1 View  Result Date: 12/03/2020 CLINICAL DATA:  Post ascending aorta replacement EXAM: PORTABLE CHEST 1 VIEW COMPARISON:  12/02/2020 FINDINGS: Endotracheal tube, partially imaged enteric tube, right IJ Swan-Ganz catheter, and mediastinal drain again identified. No pneumothorax. Increased density at the right lung base likely reflecting combination of pleural effusion and atelectasis. Increased retrocardiac atelectasis. Probable  mild pulmonary edema is similar. Stable cardiomediastinal contours. IMPRESSION: Lines and tubes as above. Increased basilar atelectasis and new right pleural effusion. Similar probable mild pulmonary edema. Electronically Signed   By: Macy Mis M.D.   On: 12/03/2020 08:15   DG Chest Port 1 View  Result Date: 12/02/2020 CLINICAL DATA:  Aortic dissection repair EXAM: PORTABLE CHEST 1 VIEW COMPARISON:  12/01/2020 FINDINGS: Single frontal view of the chest demonstrates stable enlarged cardiac silhouette. Postsurgical changes are seen from median sternotomy and aortic dissection repair. Continued ectasia of the thoracic aorta. Endotracheal tube overlies tracheal air column, tip well above carina. Enteric catheter tip and side port project over the gastric fundus. Flow directed right internal jugular central venous catheter tip overlies main pulmonary outflow tract. Mediastinal drains are identified. Bilateral interstitial and ground-glass opacities consistent with mild edema. Trace fluid seen at the apices. No evidence of pneumothorax. IMPRESSION: 1. Postsurgical changes from aortic repair. Support devices as above. 2. Mild pulmonary edema. 3. Trace fluid at the apices likely related to thoracotomy. Electronically Signed   By: Randa Ngo M.D.   On: 12/02/2020 15:51   ECHO INTRAOPERATIVE TEE  Result Date: 12/02/2020  *INTRAOPERATIVE TRANSESOPHAGEAL REPORT *  Patient Name:   Mary Stephens Date of Exam: 12/02/2020 Medical Rec #:  [id number]      Height:       68.0 in Accession #:    [id number]     Weight:       125.0 lb Date of Birth:  August 26, 1956      BSA:          1.67 m Patient Age:    64 years       BP:           102/77 mmHg Patient Gender: F              HR:           56 bpm. Exam Location:  Anesthesiology Transesophogeal exam was perform intraoperatively during surgical procedure. Patient was closely monitored under general anesthesia during the entirety of examination. Indications:  Aortic  dissection Sonographer:     Dustin Flock RDCS Performing Phys: [id number] Lucile Crater LIGHTFOOT Diagnosing Phys: Renold Don MD Complications: No known complications during this procedure. POST-OP IMPRESSIONS - Left Ventricle: The left ventricle is unchanged from pre-bypass. - Right Ventricle: The right ventricle appears unchanged from pre-bypass. - Aorta: A graft was placed in the ascending aorta for repair. - Left Atrium: The left atrium appears unchanged from pre-bypass. - Left Atrial Appendage: The left atrial appendage appears unchanged from pre-bypass. - Aortic Valve: There is no regurgitation. - Mitral Valve: There is mild regurgitation. - Tricuspid Valve: The tricuspid valve appears unchanged from pre-bypass. - Interatrial Septum: The interatrial septum appears unchanged from pre-bypass. - Interventricular Septum: The interventricular septum appears unchanged from pre-bypass. PRE-OP FINDINGS  Left Ventricle: The left ventricle has low normal systolic function, with an ejection fraction of 50-55%. The cavity size was normal. There is moderate concentric left ventricular hypertrophy. No evidence of left ventricular regional wall motion abnormalities. Right Ventricle: The right ventricle has normal systolic function. The cavity was normal. There is no increase in right ventricular wall thickness. Left Atrium: Left atrial size was normal in size. Right Atrium: Right atrial size was normal in size. Interatrial Septum: The interatrial septum was not assessed. Pericardium: A moderately sized pericardial effusion is present. The pericardial effusion is circumferential. There is no evidence of cardiac tamponade. Mitral Valve: The mitral valve is normal in structure. Mitral valve regurgitation is mild to moderate by color flow Doppler. There is No evidence of mitral stenosis. Tricuspid Valve: The tricuspid valve was normal in structure. Tricuspid valve regurgitation is trivial by color flow Doppler. Aortic Valve: The  aortic valve is tricuspid Aortic valve regurgitation is mild to moderate by color flow Doppler. There is no evidence of aortic valve stenosis. Pulmonic Valve: The pulmonic valve was normal in structure. Pulmonic valve regurgitation is trivial by color flow Doppler. Aorta: There is evidence of a dissection in the ascending aorta and aortic arch. The dissection can be classified as a Stanford type A (proximal). The dissection entry point is at the ascending aorta. +------------+--------++ AORTIC VALVE         +------------+--------++ AR PHT:     547 msec +------------+--------++  Renold Don MD Electronically signed by Renold Don MD Signature Date/Time: 12/02/2020/2:49:47 PM    Final    Medications: Infusions: . sodium chloride    . sodium chloride Stopped (12/03/20 0832)  . sodium chloride 20 mL/hr at 12/02/20 1618  . cefUROXime (ZINACEF)  IV Stopped (12/03/20 1903)  . dexmedetomidine (PRECEDEX) IV infusion Stopped (12/03/20 0935)  . insulin Stopped (12/03/20 1253)  . lactated ringers    . lactated ringers Stopped (12/02/20 1515)  . lactated ringers 20 mL/hr at 12/04/20 0500  . magnesium sulfate Stopped (12/02/20 1515)  . milrinone Stopped (12/03/20 1413)  . niCARDipine Stopped (12/04/20 0357)  . nitroGLYCERIN Stopped (12/02/20 1848)  . phenylephrine (NEO-SYNEPHRINE) Adult infusion Stopped (12/02/20 2105)    Scheduled Medications: . sodium chloride   Intravenous Once  . acetaminophen  1,000 mg Oral Q6H   Or  . acetaminophen (TYLENOL) oral liquid 160 mg/5 mL  1,000 mg Per Tube Q6H  . aspirin EC  325 mg Oral Daily   Or  . aspirin  324 mg Per Tube Daily  . atorvastatin  10 mg Oral Daily  . bisacodyl  10 mg Oral Daily   Or  . bisacodyl  10 mg Rectal Daily  . chlorhexidine  15 mL Mouth Rinse  BID  . Chlorhexidine Gluconate Cloth  6 each Topical Daily  . docusate sodium  200 mg Oral Daily  . enoxaparin (LOVENOX) injection  30 mg Subcutaneous QHS  . gabapentin  300 mg Oral QHS   . insulin aspart  0-24 Units Subcutaneous Q4H  . mouth rinse  15 mL Mouth Rinse q12n4p  . metoprolol tartrate  25 mg Oral BID  . pantoprazole  40 mg Oral Daily  . sodium chloride flush  10-40 mL Intracatheter Q12H  . sodium chloride flush  3 mL Intravenous Q12H    have reviewed scheduled and prn medications.  Physical Exam: General: alert, sitting up in bedside chair Heart: RRR Lungs: poor effort, dec BS at bases- sternal incision and chest tube Abdomen: soft, non tender Extremities: no peripheral edema     12/04/2020,7:03 AM  LOS: 2 days

## 2020-12-04 NOTE — Progress Notes (Signed)
EVENING ROUNDS NOTE :     [address]       ,[place] [address]             [phone]                 2 Days Post-Op Procedure(s) (LRB): REPAIR OF ACUTE ASCENDING THORACIC AORTIC DISSECTION VIA CIRC ARREST USING HEMASHIELD PLATINUM 28 MM VASCULAR GRAFT. (N/A) TRANSESOPHAGEAL ECHOCARDIOGRAM (TEE)   Total Length of Stay:  LOS: 2 days  Events:   Refused thoracentesis satting better Continue to monitor resp status Looks comfortable    BP 138/71   Pulse 73   Temp 98.5 F (36.9 C) (Oral)   Resp 14   Ht 5\' 8"  (1.727 m)   Wt 65.4 kg   SpO2 94%   BMI 21.92 kg/m      FiO2 (%):  [100 %] 100 %  . sodium chloride    . sodium chloride Stopped (12/03/20 0832)  . sodium chloride 20 mL/hr at 12/02/20 1618  . dexmedetomidine (PRECEDEX) IV infusion Stopped (12/03/20 0935)  . insulin Stopped (12/03/20 1253)  . lactated ringers    . lactated ringers Stopped (12/02/20 1515)  . lactated ringers 20 mL/hr at 12/04/20 1500  . magnesium sulfate Stopped (12/02/20 1515)    I/O last 3 completed shifts: In: 2493.2 [I.V.:1741.1; NG/GT:60; IV Piggyback:692.1] Out: 1520 [Urine:1090; Emesis/NG output:50; Chest Tube:380]   CBC Latest Ref Rng & Units 12/04/2020 12/04/2020 12/04/2020  WBC 4.0 - 10.5 K/uL 17.7(H) - -  Hemoglobin 12.0 - 15.0 g/dL 12.2 11.2(L) 11.9(L)  Hematocrit 36.0 - 46.0 % 35.5(L) 33.0(L) 35.0(L)  Platelets 150 - 400 K/uL 80(L) - -    BMP Latest Ref Rng & Units 12/04/2020 12/04/2020 12/04/2020  Glucose 70 - 99 mg/dL 76 - -  BUN 8 - 23 mg/dL 39(H) - -  Creatinine 0.44 - 1.00 mg/dL 3.59(H) - -  BUN/Creat Ratio 12 - 28 - - -  Sodium 135 - 145 mmol/L 142 143 143  Potassium 3.5 - 5.1 mmol/L 3.7 3.6 3.6  Chloride 98 - 111 mmol/L 109 - -  CO2 22 - 32 mmol/L 19(L) - -  Calcium 8.9 - 10.3 mg/dL 8.2(L) - -    ABG    Component Value Date/Time   PHART 7.400 12/04/2020 0214   PCO2ART 32.6 12/04/2020 0214   PO2ART 58 (L) 12/04/2020 0214   HCO3 20.3  12/04/2020 0214   TCO2 21 (L) 12/04/2020 0214   ACIDBASEDEF 4.0 (H) 12/04/2020 0214   O2SAT 62.8 12/04/2020 0227       Melodie Bouillon, MD 12/04/2020 4:30 PM

## 2020-12-04 NOTE — Progress Notes (Signed)
Pt is on [place] and she is resting well her O2 saturation is 95%. Pt denies SOB. BIPAP SB.

## 2020-12-04 NOTE — Progress Notes (Signed)
Repeat ABG obtained after pt had been placed on NRB for a few hours, O2 on ABG no better. RT attempted to place pt on bipap pt immediately ripped off stating she could not do it. RT then placed pt on 100% 35L heated highflow pt seems more comfortable however sats only 88%.

## 2020-12-05 ENCOUNTER — Inpatient Hospital Stay (HOSPITAL_COMMUNITY): Payer: Medicare (Managed Care)

## 2020-12-05 DIAGNOSIS — J90 Pleural effusion, not elsewhere classified: Secondary | ICD-10-CM

## 2020-12-05 LAB — BASIC METABOLIC PANEL
Anion gap: 12 (ref 5–15)
BUN: 46 mg/dL — ABNORMAL HIGH (ref 8–23)
CO2: 21 mmol/L — ABNORMAL LOW (ref 22–32)
Calcium: 8 mg/dL — ABNORMAL LOW (ref 8.9–10.3)
Chloride: 108 mmol/L (ref 98–111)
Creatinine, Ser: 3.54 mg/dL — ABNORMAL HIGH (ref 0.44–1.00)
GFR, Estimated: 14 mL/min — ABNORMAL LOW (ref >60)
Glucose, Bld: 91 mg/dL (ref 70–99)
Potassium: 3.8 mmol/L (ref 3.5–5.1)
Sodium: 141 mmol/L (ref 135–145)

## 2020-12-05 LAB — COOXEMETRY PANEL
Carboxyhemoglobin: 0.7 % (ref 0.5–1.5)
Methemoglobin: 0.9 % (ref 0.0–1.5)
O2 Saturation: 74.2 %
Total hemoglobin: 12 g/dL (ref 12.0–16.0)

## 2020-12-05 LAB — GLUCOSE, CAPILLARY
Glucose-Capillary: 125 mg/dL — ABNORMAL HIGH (ref 70–99)
Glucose-Capillary: 127 mg/dL — ABNORMAL HIGH (ref 70–99)
Glucose-Capillary: 77 mg/dL (ref 70–99)
Glucose-Capillary: 85 mg/dL (ref 70–99)
Glucose-Capillary: 85 mg/dL (ref 70–99)
Glucose-Capillary: 91 mg/dL (ref 70–99)
Glucose-Capillary: 92 mg/dL (ref 70–99)

## 2020-12-05 MED ORDER — FUROSEMIDE 10 MG/ML IJ SOLN
40.0000 mg | Freq: Once | INTRAMUSCULAR | Status: AC
  Administered 2020-12-05: 12:00:00 40 mg via INTRAVENOUS
  Filled 2020-12-05: qty 4

## 2020-12-05 MED ORDER — LEVALBUTEROL HCL 0.63 MG/3ML IN NEBU
0.6300 mg | INHALATION_SOLUTION | Freq: Four times a day (QID) | RESPIRATORY_TRACT | Status: DC
  Administered 2020-12-05 (×2): 0.63 mg via RESPIRATORY_TRACT
  Filled 2020-12-05 (×3): qty 3

## 2020-12-05 NOTE — Progress Notes (Signed)
Patient refused sternal wound dressing removal, rationale for the dressing removal explained.

## 2020-12-05 NOTE — Plan of Care (Signed)

## 2020-12-05 NOTE — Plan of Care (Signed)
  Problem: Education: Goal: Knowledge of General Education information will improve Description: Including pain rating scale, medication(s)/side effects and non-pharmacologic comfort measures Outcome: Progressing   Problem: Clinical Measurements: Goal: Ability to maintain clinical measurements within normal limits will improve Outcome: Progressing Goal: Will remain free from infection Outcome: Progressing Goal: Diagnostic test results will improve Outcome: Progressing Goal: Respiratory complications will improve Outcome: Progressing Goal: Cardiovascular complication will be avoided Outcome: Progressing   Problem: Activity: Goal: Risk for activity intolerance will decrease Outcome: Progressing   Problem: Nutrition: Goal: Adequate nutrition will be maintained Outcome: Progressing   Problem: Elimination: Goal: Will not experience complications related to bowel motility Outcome: Progressing Goal: Will not experience complications related to urinary retention Outcome: Progressing   Problem: Pain Managment: Goal: General experience of comfort will improve Outcome: Progressing   Problem: Safety: Goal: Ability to remain free from injury will improve Outcome: Progressing   Problem: Skin Integrity: Goal: Risk for impaired skin integrity will decrease Outcome: Progressing   Problem: Activity: Goal: Risk for activity intolerance will decrease Outcome: Progressing   Problem: Cardiac: Goal: Will achieve and/or maintain hemodynamic stability Outcome: Progressing   Problem: Clinical Measurements: Goal: Postoperative complications will be avoided or minimized Outcome: Progressing   Problem: Respiratory: Goal: Respiratory status will improve Outcome: Progressing   Problem: Skin Integrity: Goal: Wound healing without signs and symptoms of infection Outcome: Progressing Goal: Risk for impaired skin integrity will decrease Outcome: Progressing   Problem: Urinary  Elimination: Goal: Ability to achieve and maintain adequate renal perfusion and functioning will improve Outcome: Progressing

## 2020-12-05 NOTE — Progress Notes (Signed)
Subjective:  Kidney function labs fairly stable again the last 24 hours-  UOP increased quite a bit, 2500 recorded after a one time dose of lasix 40 mg yesterday afternoon- says breathing is better - still on HF02  Objective Vital signs in last 24 hours: Vitals:   12/05/20 0400 12/05/20 0428 12/05/20 0500 12/05/20 0600  BP: (!) 142/79 (!) 142/79 136/74 (!) 141/77  Pulse: 76  81 81  Resp: 16  (!) 22 12  Temp: 98.6 F (37 C)     TempSrc: Oral     SpO2: 94%  91% 98%  Weight:      Height:       Weight change:   Intake/Output Summary (Last 24 hours) at 12/05/2020 0716 Last data filed at 12/05/2020 0600 Gross per 24 hour  Intake 808.38 ml  Output 2582 ml  Net -1773.62 ml    Assessment/Plan: 1.  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  Pt was also hypotensive and received IV contrast with emergent CT angio.  She has advanced CKD at baseline and unclear how much renal function she will recover.  1. No urgent indication for dialysis at this time and will continue to follow UOP and daily Scr  2. Type A aortic dissection- s/p repair.   Recovering well considering 3. HTN- A line reads higher than cuff-  required PRN nicardipine as well as other meds -  Balancing act keeping BP down but not too low.  Volume does not appear excessive peripherally -  Got lasix 40 IV once yesterday seems mostly due to pleural effusion as is noted in the chart she refused thoracentesis ? Now we know that she will respond to lasix- would continue with PRN dosing  4. Hypokalemia- will replete carefully given AKI and oliguria- give 40 po today  5. DM- per primary. 6. Thrombocytopenia- stable 7. Tobacco use- may explain some of the hypoxia  8. Metabolic acidosis-  Started oral bicarb 9. Anemia-  Has not been an issue requiring treatment     Louis Meckel    Labs: Basic Metabolic Panel: Recent Labs  Lab 12/03/20 1604 12/03/20 2152 12/04/20 0214 12/04/20 0227 12/05/20 0430  NA 142   < > 143 142 141  K 3.6   < > 3.6 3.7 3.8  CL 108  --   --  109 108  CO2 19*  --   --  19* 21*  GLUCOSE 135*  --   --  76 91  BUN 37*  --   --  39* 46*  CREATININE 3.53*  --   --  3.59* 3.54*  CALCIUM 8.2*  --   --  8.2* 8.0*   < > = values in this interval not displayed.   Liver Function Tests: Recent Labs  Lab 12/02/20 0550  AST 17  ALT 13  ALKPHOS 63  BILITOT 0.4  PROT 6.8  ALBUMIN 3.5   Recent Labs  Lab 12/02/20 0550  LIPASE 21   No results for input(s): AMMONIA in the last 168 hours. CBC: Recent Labs  Lab 12/02/20 2201 12/03/20 0406 12/03/20 0407 12/03/20 0829 12/03/20 0916 12/03/20 1604 12/03/20 2152 12/04/20 0001 12/04/20 0214 12/04/20 0227  WBC 19.1*  --  17.1* 18.0*  --  17.9*  --   --   --  17.7*  HGB 13.8   < >  13.2 12.9   < > 12.6   < > 11.9* 11.2* 12.2  HCT 41.4   < > 39.0 37.9   < > 34.8*   < > 35.0* 33.0* 35.5*  MCV 92.4  --  91.8 90.5  --  88.8  --   --   --  90.8  PLT 120*  --  101* 94*  --  89*  --   --   --  80*   < > = values in this interval not displayed.   Cardiac Enzymes: No results for input(s): CKTOTAL, CKMB, CKMBINDEX, TROPONINI in the last 168 hours. CBG: Recent Labs  Lab 12/04/20 1213 12/04/20 1541 12/04/20 1958 12/05/20 0041 12/05/20 0416  GLUCAP 118* 99 130* 77 91    Iron Studies: No results for input(s): IRON, TIBC, TRANSFERRIN, FERRITIN in the last 72 hours. Studies/Results: DG Chest Port 1 View  Result Date: 12/04/2020 CLINICAL DATA:  Status post extubation EXAM: PORTABLE CHEST 1 VIEW COMPARISON:  December 03, 2020 FINDINGS: Endotracheal tube and nasogastric tube have been removed. Swan-Ganz catheter is been removed. Cordis tip is in the superior cava. Left chest tube and mediastinal drain are unchanged in positions. No pneumothorax appreciable. There are persistent  pleural effusions with airspace opacity in the left lower lobe. There is atelectatic change in the right base. Cardiomegaly is stable. The pulmonary vascularity is within normal limits. No adenopathy. Status post median sternotomy. No bone lesions. IMPRESSION: Tube and catheter positions as described without pneumothorax. Pleural effusions bilaterally persist. Airspace opacity in the left lower lobe is likely due primarily to atelectasis, although superimposed pneumonia in the left base cannot be excluded. There is stable atelectasis right base. Stable cardiomegaly. Electronically Signed   By: Lowella Grip III M.D.   On: 12/04/2020 07:58   Medications: Infusions: . sodium chloride    . sodium chloride Stopped (12/03/20 0832)  . sodium chloride 20 mL/hr at 12/02/20 1618  . dexmedetomidine (PRECEDEX) IV infusion Stopped (12/03/20 0935)  . insulin Stopped (12/03/20 1253)  . lactated ringers    . lactated ringers Stopped (12/02/20 1515)  . lactated ringers 20 mL/hr at 12/05/20 0600  . magnesium sulfate Stopped (12/02/20 1515)    Scheduled Medications: . sodium chloride   Intravenous Once  . acetaminophen  1,000 mg Oral Q6H   Or  . acetaminophen (TYLENOL) oral liquid 160 mg/5 mL  1,000 mg Per Tube Q6H  . amLODipine  10 mg Oral Daily  . aspirin EC  325 mg Oral Daily   Or  . aspirin  324 mg Per Tube Daily  . atorvastatin  10 mg Oral Daily  . bisacodyl  10 mg Oral Daily   Or  . bisacodyl  10 mg Rectal Daily  . chlorhexidine  15 mL Mouth Rinse BID  . Chlorhexidine Gluconate Cloth  6 each Topical Daily  . docusate sodium  200 mg Oral Daily  . enoxaparin (LOVENOX) injection  30 mg Subcutaneous QHS  . gabapentin  300 mg Oral QHS  . insulin aspart  0-24 Units Subcutaneous Q4H  . mouth rinse  15 mL Mouth Rinse q12n4p  . metoprolol tartrate  25 mg Oral BID  . pantoprazole  40 mg Oral Daily  . potassium chloride  20 mEq Oral BID  . sodium bicarbonate  650 mg Oral BID  . sodium chloride  flush  10-40 mL Intracatheter Q12H  . sodium chloride flush  3 mL Intravenous Q12H    have reviewed scheduled and prn medications.  Physical Exam: General: alert, NAD Heart: RRR Lungs: poor effort, dec BS at bases- sternal incision and chest tube Abdomen: soft, non tender Extremities: no peripheral edema     12/05/2020,7:16 AM  LOS: 3 days

## 2020-12-05 NOTE — CV Procedure (Signed)
  Cardiothoracic Surgery procedure note:   Right Thoracentesis   After informed consent obtained, the patient was positioned sitting on the side of her bed. The back was prepped with choroprep and draped using sterile technique. A time out was taken and the proper patient, proper side, and proper procedure were confirmed. 1% lidocaine with epinephrine was injected into the skin and subcutaneous tissue of the right lower back down to the pleural space. Upon entering the pleural space there was light serosanguinous fluid. The thoracentesis catheter was inserted and connected to a vacuum bottle. 600 cc of serosanguinous fluid was removed without difficulty. The catheter was removed and a bandaid applied. CXR was ordered. She tolerated the procedure well.

## 2020-12-05 NOTE — Plan of Care (Signed)
  Problem: Clinical Measurements: Goal: Ability to maintain clinical measurements within normal limits will improve Outcome: Progressing   Problem: Clinical Measurements: Goal: Respiratory complications will improve Outcome: Progressing   

## 2020-12-05 NOTE — Progress Notes (Signed)
Patient ID: Mary Stephens, female   DOB: May 01, 1956, 64 y.o.   MRN: [id number] [facility]  Hemodynamically stable in sinus rhythm.  Feels better after drainage of right pleural effusion. CXR shows drainage of effusion but still had RLL atelectasis.  Sats 99% on HFNC. Drops to mid 80's with oxygen off.  Good urine output with lasix today.

## 2020-12-05 NOTE — Progress Notes (Signed)
3 Days Post-Op Procedure(s) (LRB): REPAIR OF ACUTE ASCENDING THORACIC AORTIC DISSECTION VIA CIRC ARREST USING HEMASHIELD PLATINUM 28 MM VASCULAR GRAFT. (N/A) TRANSESOPHAGEAL ECHOCARDIOGRAM (TEE) Subjective:  Short of breath. On 80% HFNC with sats 97%.  Objective: Vital signs in last 24 hours: Temp:  [98.2 F (36.8 C)-98.8 F (37.1 C)] 98.5 F (36.9 C) (12/24 0744) Pulse Rate:  [69-91] 85 (12/24 0900) Cardiac Rhythm: Normal sinus rhythm (12/24 0800) Resp:  [11-27] 20 (12/24 0900) BP: (91-145)/(65-100) 131/81 (12/24 0900) SpO2:  [85 %-100 %] 98 % (12/24 0900) Arterial Line BP: (128-154)/(59-69) 154/67 (12/23 2345) FiO2 (%):  [80 %-100 %] 80 % (12/24 0755)  Hemodynamic parameters for last 24 hours:    Intake/Output from previous day: 12/23 0701 - 12/24 0700 In: 828.4 [I.V.:828.4] Out: 2582 [Urine:2512; Chest Tube:70] Intake/Output this shift: Total I/O In: 200 [P.O.:200] Out: 60 [Chest Tube:60]  General appearance: alert and cooperative Neurologic: intact Heart: regular rate and rhythm, S1, S2 normal, no murmur Lungs: diminished breath sounds bibasilar, worse on right. Extremities: edema moderate Wound: dressings dry  Lab Results: Recent Labs    12/03/20 1604 12/03/20 2152 12/04/20 0214 12/04/20 0227  WBC 17.9*  --   --  17.7*  HGB 12.6   < > 11.2* 12.2  HCT 34.8*   < > 33.0* 35.5*  PLT 89*  --   --  80*   < > = values in this interval not displayed.   BMET:  Recent Labs    12/04/20 0227 12/05/20 0430  NA 142 141  K 3.7 3.8  CL 109 108  CO2 19* 21*  GLUCOSE 76 91  BUN 39* 46*  CREATININE 3.59* 3.54*  CALCIUM 8.2* 8.0*    PT/INR:  Recent Labs    12/02/20 1530  LABPROT 18.2*  INR 1.6*   ABG    Component Value Date/Time   PHART 7.400 12/04/2020 0214   HCO3 20.3 12/04/2020 0214   TCO2 21 (L) 12/04/2020 0214   ACIDBASEDEF 4.0 (H) 12/04/2020 0214   O2SAT 74.2 12/05/2020 0435   CBG (last 3)  Recent Labs    12/05/20 0041 12/05/20 0416  12/05/20 0743  GLUCAP 77 91 85    Assessment/Plan: S/P Procedure(s) (LRB): REPAIR OF ACUTE ASCENDING THORACIC AORTIC DISSECTION VIA CIRC ARREST USING HEMASHIELD PLATINUM 28 MM VASCULAR GRAFT. (N/A) TRANSESOPHAGEAL ECHOCARDIOGRAM (TEE)  POD 3  Hemodynamically stable in sinus rhythm. Continue current antihypertensives.  High oxygen requirement with bilateral pleural effusion and lower lobe atelectasis. It looks worse on the right. I think right thoracentesis may improve this. I discussed it with patient including benefits and risks and she agrees to proceed. Work on IS.  Chronic kidney disease with acute kidney injury due to contrast, pump run with circ arrest, hypotension. Creat seems to have reached plateau and she responded to 40 mg lasix yesterday with -1700 cc for the day. Wt is 19 lbs over preop so will give her another dose of lasix today.  Chest tube output 70 cc since 3 am. These are MT's so will leave in for now.      LOS: 3 days    Mary Stephens 12/05/2020

## 2020-12-06 ENCOUNTER — Inpatient Hospital Stay (HOSPITAL_COMMUNITY): Payer: Medicare (Managed Care)

## 2020-12-06 LAB — BASIC METABOLIC PANEL
Anion gap: 13 (ref 5–15)
BUN: 51 mg/dL — ABNORMAL HIGH (ref 8–23)
CO2: 23 mmol/L (ref 22–32)
Calcium: 8.1 mg/dL — ABNORMAL LOW (ref 8.9–10.3)
Chloride: 107 mmol/L (ref 98–111)
Creatinine, Ser: 3.55 mg/dL — ABNORMAL HIGH (ref 0.44–1.00)
GFR, Estimated: 14 mL/min — ABNORMAL LOW (ref >60)
Glucose, Bld: 95 mg/dL (ref 70–99)
Potassium: 3.7 mmol/L (ref 3.5–5.1)
Sodium: 143 mmol/L (ref 135–145)

## 2020-12-06 LAB — TYPE AND SCREEN
ABO/RH(D): O POS
Antibody Screen: NEGATIVE
Unit division: 0
Unit division: 0
Unit division: 0
Unit division: 0
Unit division: 0
Unit division: 0
Unit division: 0
Unit division: 0

## 2020-12-06 LAB — BPAM RBC
Blood Product Expiration Date: 202201172359
Blood Product Expiration Date: 202201232359
Blood Product Expiration Date: 202201232359
Blood Product Expiration Date: 202201232359
Blood Product Expiration Date: 202201232359
Blood Product Expiration Date: 202201232359
Blood Product Expiration Date: 202201232359
Blood Product Expiration Date: 202201232359
ISSUE DATE / TIME: 202112210832
ISSUE DATE / TIME: 202112210832
ISSUE DATE / TIME: 202112210832
ISSUE DATE / TIME: 202112210832
ISSUE DATE / TIME: 202112211304
ISSUE DATE / TIME: 202112221231
Unit Type and Rh: 5100
Unit Type and Rh: 5100
Unit Type and Rh: 5100
Unit Type and Rh: 5100
Unit Type and Rh: 5100
Unit Type and Rh: 5100
Unit Type and Rh: 5100
Unit Type and Rh: 5100

## 2020-12-06 LAB — CBC
HCT: 34.2 % — ABNORMAL LOW (ref 36.0–46.0)
Hemoglobin: 11.2 g/dL — ABNORMAL LOW (ref 12.0–15.0)
MCH: 30.9 pg (ref 26.0–34.0)
MCHC: 32.7 g/dL (ref 30.0–36.0)
MCV: 94.2 fL (ref 80.0–100.0)
Platelets: 102 10*3/uL — ABNORMAL LOW (ref 150–400)
RBC: 3.63 MIL/uL — ABNORMAL LOW (ref 3.87–5.11)
RDW: 14.2 % (ref 11.5–15.5)
WBC: 10.5 10*3/uL (ref 4.0–10.5)
nRBC: 0 % (ref 0.0–0.2)

## 2020-12-06 LAB — COOXEMETRY PANEL
Carboxyhemoglobin: 1.1 % (ref 0.5–1.5)
Methemoglobin: 0.9 % (ref 0.0–1.5)
O2 Saturation: 75.3 %
Total hemoglobin: 11.3 g/dL — ABNORMAL LOW (ref 12.0–16.0)

## 2020-12-06 LAB — GLUCOSE, CAPILLARY
Glucose-Capillary: 104 mg/dL — ABNORMAL HIGH (ref 70–99)
Glucose-Capillary: 111 mg/dL — ABNORMAL HIGH (ref 70–99)
Glucose-Capillary: 111 mg/dL — ABNORMAL HIGH (ref 70–99)
Glucose-Capillary: 112 mg/dL — ABNORMAL HIGH (ref 70–99)
Glucose-Capillary: 123 mg/dL — ABNORMAL HIGH (ref 70–99)
Glucose-Capillary: 96 mg/dL (ref 70–99)

## 2020-12-06 MED ORDER — POTASSIUM CHLORIDE CRYS ER 20 MEQ PO TBCR
20.0000 meq | EXTENDED_RELEASE_TABLET | Freq: Two times a day (BID) | ORAL | Status: DC
  Administered 2020-12-06 (×2): 20 meq via ORAL
  Filled 2020-12-06 (×2): qty 1

## 2020-12-06 MED ORDER — LEVALBUTEROL HCL 0.63 MG/3ML IN NEBU
0.6300 mg | INHALATION_SOLUTION | Freq: Two times a day (BID) | RESPIRATORY_TRACT | Status: DC
  Administered 2020-12-06 – 2020-12-08 (×4): 0.63 mg via RESPIRATORY_TRACT
  Filled 2020-12-06 (×4): qty 3

## 2020-12-06 MED ORDER — LEVALBUTEROL HCL 0.63 MG/3ML IN NEBU
0.6300 mg | INHALATION_SOLUTION | Freq: Three times a day (TID) | RESPIRATORY_TRACT | Status: DC
  Filled 2020-12-06: qty 3

## 2020-12-06 NOTE — Progress Notes (Signed)
Subjective:  Kidney function labs remain stable-  UOP 1800  (given lasix 40 at 1100 yest) with overall negative 1900 with CT output and O2 needs decreasing   Objective Vital signs in last 24 hours: Vitals:   12/06/20 0500 12/06/20 0600 12/06/20 0642 12/06/20 0700  BP: 131/77 122/70    Pulse: 86 83  85  Resp: 13 13  18   Temp:      TempSrc:      SpO2: 97% 96%  97%  Weight:   62.4 kg   Height:       Weight change:   Intake/Output Summary (Last 24 hours) at 12/06/2020 0800 Last data filed at 12/06/2020 0500 Gross per 24 hour  Intake 523.33 ml  Output 2640 ml  Net -2116.67 ml    Assessment/Plan: 1.  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  Pt was also hypotensive and received IV contrast with emergent CT angio.  She has advanced CKD at baseline and unclear how much renal function she will recover.  1. Renal function has remained stable but poor with crt 3.5. there have been No indication for dialysis  2. Type A aortic dissection- s/p repair.   Recovering well considering 3. HTN -  Balancing act keeping BP down but not too low.  Volume does not appear excessive peripherally -  Getting lasix 40 IV PRN  Now we know that she will respond to lasix- would continue with PRN dosing  4. Hypokalemia- will replete carefully given AKI and oliguria- give 40 po today again 5. DM- per primary. 6. Thrombocytopenia- improving 7. Tobacco use- may explain some of the hypoxia  8. Metabolic acidosis-  Started oral bicarb- improving 9. Anemia-  Has not been an issue requiring treatment     Louis Meckel    Labs: Basic Metabolic Panel: Recent Labs  Lab 12/04/20 0227 12/05/20 0430 12/06/20 0500  NA 142 141 143  K 3.7 3.8 3.7  CL 109 108 107  CO2 19* 21* 23  GLUCOSE 76 91 95  BUN 39* 46* 51*  CREATININE 3.59* 3.54* 3.55*  CALCIUM 8.2*  8.0* 8.1*   Liver Function Tests: Recent Labs  Lab 12/02/20 0550  AST 17  ALT 13  ALKPHOS 63  BILITOT 0.4  PROT 6.8  ALBUMIN 3.5   Recent Labs  Lab 12/02/20 0550  LIPASE 21   No results for input(s): AMMONIA in the last 168 hours. CBC: Recent Labs  Lab 12/03/20 0407 12/03/20 0829 12/03/20 0916 12/03/20 1604 12/03/20 2152 12/04/20 0214 12/04/20 0227 12/06/20 0500  WBC 17.1* 18.0*  --  17.9*  --   --  17.7* 10.5  HGB 13.2 12.9   < > 12.6   < > 11.2* 12.2 11.2*  HCT 39.0 37.9   < > 34.8*   < > 33.0* 35.5* 34.2*  MCV 91.8 90.5  --  88.8  --   --  90.8 94.2  PLT 101* 94*  --  89*  --   --  80* 102*   < > = values in this interval not displayed.   Cardiac Enzymes: No results for input(s): CKTOTAL, CKMB, CKMBINDEX, TROPONINI in the last 168 hours. CBG: Recent Labs  Lab 12/05/20 1145 12/05/20 1525 12/05/20 1937 12/05/20 2326 12/06/20 0527  GLUCAP 92 127* 125* 85 96    Iron Studies: No results  for input(s): IRON, TIBC, TRANSFERRIN, FERRITIN in the last 72 hours. Studies/Results: DG CHEST PORT 1 VIEW  Result Date: 12/05/2020 CLINICAL DATA:  Shortness of breath.  Right-sided thoracentesis EXAM: PORTABLE CHEST 1 VIEW COMPARISON:  12/04/2020 FINDINGS: Right IJ sheath remains position within the SVC. Median sternotomy wires. Left chest tube and mediastinal drain remain in place. Stable cardiomegaly. Small bilateral pleural effusions, decreased on the right. Patchy bibasilar opacities, similar to prior. No pneumothorax. IMPRESSION: 1. Small bilateral pleural effusions, decreased on the right. No pneumothorax. 2. Patchy bibasilar opacities, similar to prior. Electronically Signed   By: Davina Poke D.O.   On: 12/05/2020 13:08   Medications: Infusions: . sodium chloride    . sodium chloride Stopped (12/03/20 0832)  . sodium chloride 20 mL/hr at 12/02/20 1618  . lactated ringers Stopped (12/02/20 1515)  . lactated ringers 10 mL/hr at 12/06/20 0700  . magnesium  sulfate Stopped (12/02/20 1515)    Scheduled Medications: . sodium chloride   Intravenous Once  . acetaminophen  1,000 mg Oral Q6H   Or  . acetaminophen (TYLENOL) oral liquid 160 mg/5 mL  1,000 mg Per Tube Q6H  . amLODipine  10 mg Oral Daily  . aspirin EC  325 mg Oral Daily   Or  . aspirin  324 mg Per Tube Daily  . atorvastatin  10 mg Oral Daily  . bisacodyl  10 mg Oral Daily   Or  . bisacodyl  10 mg Rectal Daily  . chlorhexidine  15 mL Mouth Rinse BID  . Chlorhexidine Gluconate Cloth  6 each Topical Daily  . docusate sodium  200 mg Oral Daily  . enoxaparin (LOVENOX) injection  30 mg Subcutaneous QHS  . gabapentin  300 mg Oral QHS  . insulin aspart  0-24 Units Subcutaneous Q4H  . levalbuterol  0.63 mg Nebulization TID  . mouth rinse  15 mL Mouth Rinse q12n4p  . metoprolol tartrate  25 mg Oral BID  . pantoprazole  40 mg Oral Daily  . sodium bicarbonate  650 mg Oral BID  . sodium chloride flush  10-40 mL Intracatheter Q12H  . sodium chloride flush  3 mL Intravenous Q12H    have reviewed scheduled and prn medications.  Physical Exam: General: alert, NAD Heart: RRR Lungs: poor effort, dec BS at bases- sternal incision and chest tube Abdomen: soft, non tender Extremities: no peripheral edema     12/06/2020,8:00 AM  LOS: 4 days

## 2020-12-06 NOTE — Progress Notes (Signed)
4 Days Post-Op Procedure(s) (LRB): REPAIR OF ACUTE ASCENDING THORACIC AORTIC DISSECTION VIA CIRC ARREST USING HEMASHIELD PLATINUM 28 MM VASCULAR GRAFT. (N/A) TRANSESOPHAGEAL ECHOCARDIOGRAM (TEE) Subjective:  No specific complaints. Breathing better.  Objective: Vital signs in last 24 hours: Temp:  [98 F (36.7 C)-98.9 F (37.2 C)] 98.4 F (36.9 C) (12/25 0809) Pulse Rate:  [71-90] 85 (12/25 0700) Cardiac Rhythm: Normal sinus rhythm (12/25 0400) Resp:  [10-25] 18 (12/25 0700) BP: (117-154)/(49-95) 122/70 (12/25 0600) SpO2:  [94 %-100 %] 97 % (12/25 0700) FiO2 (%):  [30 %-80 %] 30 % (12/25 0901) Weight:  [62.4 kg] 62.4 kg (12/25 0642)  Hemodynamic parameters for last 24 hours:    Intake/Output from previous day: 12/24 0701 - 12/25 0700 In: 723.3 [P.O.:550; I.V.:173.3] Out: 2700 [Urine:1830; Chest Tube:270] Intake/Output this shift: No intake/output data recorded.  General appearance: alert and cooperative Neurologic: intact Heart: regular rate and rhythm, S1, S2 normal, no murmur Lungs: diminished breath sounds left base with crackles both bases Extremities: mild edema Wound: dressings dry  Lab Results: Recent Labs    12/04/20 0227 12/06/20 0500  WBC 17.7* 10.5  HGB 12.2 11.2*  HCT 35.5* 34.2*  PLT 80* 102*   BMET:  Recent Labs    12/05/20 0430 12/06/20 0500  NA 141 143  K 3.8 3.7  CL 108 107  CO2 21* 23  GLUCOSE 91 95  BUN 46* 51*  CREATININE 3.54* 3.55*  CALCIUM 8.0* 8.1*    PT/INR: No results for input(s): LABPROT, INR in the last 72 hours. ABG    Component Value Date/Time   PHART 7.400 12/04/2020 0214   HCO3 20.3 12/04/2020 0214   TCO2 21 (L) 12/04/2020 0214   ACIDBASEDEF 4.0 (H) 12/04/2020 0214   O2SAT 75.3 12/06/2020 0530   CBG (last 3)  Recent Labs    12/05/20 1937 12/05/20 2326 12/06/20 0527  GLUCAP 125* 85 96   CXR: improved aeration right lower lobe after drainage of effusion. Small left effusion and basilar atelectasis.    Assessment/Plan: S/P Procedure(s) (LRB): REPAIR OF ACUTE ASCENDING THORACIC AORTIC DISSECTION VIA CIRC ARREST USING HEMASHIELD PLATINUM 28 MM VASCULAR GRAFT. (N/A) TRANSESOPHAGEAL ECHOCARDIOGRAM (TEE)  POD 4 Hemodynamically stable in sinus rhythm. Continue Lopressor and Norvasc.  Oxygen requirement improved. Will try to switch to [place] today. Continue IS, mobilization.  Chronic kidney disease with acute kidney injury due to contrast, pump run with circ arrest, hypotension. Creat stable. -2L yesterday with 40 mg lasix. Wt down 6.5 lbs. Not sure what preop wt is. 125 and 137 recorded. Hold off on further diuresis today.  DC MT's   LOS: 4 days    Mary Stephens 12/06/2020

## 2020-12-07 ENCOUNTER — Encounter (HOSPITAL_COMMUNITY): Payer: Self-pay | Admitting: Thoracic Surgery (Cardiothoracic Vascular Surgery)

## 2020-12-07 LAB — GLUCOSE, CAPILLARY
Glucose-Capillary: 102 mg/dL — ABNORMAL HIGH (ref 70–99)
Glucose-Capillary: 133 mg/dL — ABNORMAL HIGH (ref 70–99)
Glucose-Capillary: 107 mg/dL — ABNORMAL HIGH (ref 70–99)
Glucose-Capillary: 99 mg/dL (ref 70–99)
Glucose-Capillary: 109 mg/dL — ABNORMAL HIGH (ref 70–99)
Glucose-Capillary: 109 mg/dL — ABNORMAL HIGH (ref 70–99)

## 2020-12-07 LAB — BASIC METABOLIC PANEL
Anion gap: 8 (ref 5–15)
BUN: 52 mg/dL — ABNORMAL HIGH (ref 8–23)
CO2: 24 mmol/L (ref 22–32)
Calcium: 8.2 mg/dL — ABNORMAL LOW (ref 8.9–10.3)
Chloride: 111 mmol/L (ref 98–111)
Creatinine, Ser: 3.7 mg/dL — ABNORMAL HIGH (ref 0.44–1.00)
GFR, Estimated: 13 mL/min — ABNORMAL LOW (ref >60)
Glucose, Bld: 102 mg/dL — ABNORMAL HIGH (ref 70–99)
Potassium: 4.2 mmol/L (ref 3.5–5.1)
Sodium: 143 mmol/L (ref 135–145)

## 2020-12-07 LAB — COOXEMETRY PANEL
Carboxyhemoglobin: 1.4 % (ref 0.5–1.5)
Methemoglobin: 1.1 % (ref 0.0–1.5)
O2 Saturation: 74.3 %
Total hemoglobin: 11.1 g/dL — ABNORMAL LOW (ref 12.0–16.0)

## 2020-12-07 LAB — CBC
HCT: 32.3 % — ABNORMAL LOW (ref 36.0–46.0)
Hemoglobin: 10.7 g/dL — ABNORMAL LOW (ref 12.0–15.0)
MCH: 31.4 pg (ref 26.0–34.0)
MCHC: 33.1 g/dL (ref 30.0–36.0)
MCV: 94.7 fL (ref 80.0–100.0)
Platelets: 119 10*3/uL — ABNORMAL LOW (ref 150–400)
RBC: 3.41 MIL/uL — ABNORMAL LOW (ref 3.87–5.11)
RDW: 14.2 % (ref 11.5–15.5)
WBC: 8 10*3/uL (ref 4.0–10.5)
nRBC: 0 % (ref 0.0–0.2)

## 2020-12-07 MED ORDER — CLONIDINE HCL 0.1 MG PO TABS
0.1000 mg | ORAL_TABLET | Freq: Two times a day (BID) | ORAL | Status: DC
  Administered 2020-12-07 (×2): 0.1 mg via ORAL
  Filled 2020-12-07 (×2): qty 1

## 2020-12-07 NOTE — Progress Notes (Signed)
Patient ID: Mary Stephens, female   DOB: 1956/09/13, 64 y.o.   MRN: [id number] T[facility] [place] Rounds:  Remains hypertensive at times to 150-160's. Clonidine started earlier at low dose due to renal failure.  Sats 97% on 2L  300 cc UO today without diuretic.  followup labs in am.

## 2020-12-07 NOTE — Progress Notes (Signed)
5 Days Post-Op Procedure(s) (LRB): REPAIR OF ACUTE ASCENDING THORACIC AORTIC DISSECTION VIA CIRC ARREST USING HEMASHIELD PLATINUM 28 MM VASCULAR GRAFT. (N/A) TRANSESOPHAGEAL ECHOCARDIOGRAM (TEE) Subjective: Only complaint is of intermittent headache. Breathing is good.  Has received some prn hydralazine for hypertension 150's to 160's. MAP>100. Objective: Vital signs in last 24 hours: Temp:  [98.3 F (36.8 C)-98.7 F (37.1 C)] 98.3 F (36.8 C) (12/26 0811) Pulse Rate:  [72-93] 88 (12/26 0800) Cardiac Rhythm: Normal sinus rhythm (12/26 0800) Resp:  [11-29] 25 (12/26 0800) BP: (125-164)/(69-93) 147/84 (12/26 0800) SpO2:  [90 %-98 %] 92 % (12/26 0800) Weight:  [63 kg] 63 kg (12/26 0500)  Hemodynamic parameters for last 24 hours:    Intake/Output from previous day: 12/25 0701 - 12/26 0700 In: 222.5 [I.V.:222.5] Out: 300 [Urine:300] Intake/Output this shift: Total I/O In: 37.7 [I.V.:37.7] Out: -   General appearance: alert and cooperative Neurologic: intact Heart: regular rate and rhythm, S1, S2 normal, no murmur Lungs: clear to auscultation bilaterally Abdomen: soft, non-tender; bowel sounds normal Extremities: extremities normal,  no cyanosis or edema Wound: incisions ok  Lab Results: Recent Labs    12/06/20 0500 12/07/20 0328  WBC 10.5 8.0  HGB 11.2* 10.7*  HCT 34.2* 32.3*  PLT 102* 119*   BMET:  Recent Labs    12/06/20 0500 12/07/20 0328  NA 143 143  K 3.7 4.2  CL 107 111  CO2 23 24  GLUCOSE 95 102*  BUN 51* 52*  CREATININE 3.55* 3.70*  CALCIUM 8.1* 8.2*    PT/INR: No results for input(s): LABPROT, INR in the last 72 hours. ABG    Component Value Date/Time   PHART 7.400 12/04/2020 0214   HCO3 20.3 12/04/2020 0214   TCO2 21 (L) 12/04/2020 0214   ACIDBASEDEF 4.0 (H) 12/04/2020 0214   O2SAT 74.3 12/07/2020 0328   CBG (last 3)  Recent Labs    12/06/20 2330 12/07/20 0326 12/07/20 0753  GLUCAP 111* 102* 133*    Assessment/Plan: S/P  Procedure(s) (LRB): REPAIR OF ACUTE ASCENDING THORACIC AORTIC DISSECTION VIA CIRC ARREST USING HEMASHIELD PLATINUM 28 MM VASCULAR GRAFT. (N/A) TRANSESOPHAGEAL ECHOCARDIOGRAM (TEE)  POD 5   Hemodynamically stable with some episodes of HTN. Will add clonidine 0.1 bid to regimen. Would like to keep MAP 80-90. May be cause of her headaches.  On room air today. Will repeat CXR in am.  Chronic kidney disease with acute kidney injury due to contrast, pump run with circ arrest, hypotension. Creat up slightly today to 3.7. I=O. She is probably close to preop wt. No edema. Hold off on further diuresis for now.   DC sleeve   IS, ambulation.   LOS: 5 days    Gaye Pollack 12/07/2020

## 2020-12-07 NOTE — Progress Notes (Signed)
Subjective:  Kidney function labs remain pretty stable-  UOP not well recorded- BP a little high this am.  Headache this AM  Objective Vital signs in last 24 hours: Vitals:   12/07/20 0500 12/07/20 0600 12/07/20 0700 12/07/20 0723  BP: 139/69 (!) 147/93 (!) 162/87 (!) 162/83  Pulse: 81 78 87   Resp: 14 13 (!) 24   Temp:      TempSrc:      SpO2: 98% 97% 93%   Weight: 63 kg     Height:       Weight change: 0.6 kg  Intake/Output Summary (Last 24 hours) at 12/07/2020 1638 Last data filed at 12/07/2020 0400 Gross per 24 hour  Intake 222.52 ml  Output 300 ml  Net -77.48 ml    Assessment/Plan: 1.  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  Pt was also hypotensive and received IV contrast with emergent CT angio.  She has advanced CKD at baseline and unclear how much renal function she will recover.  1. Renal function has remained stable but poor with crt around 3.5. there have been No indication for dialysis- no access  2. Type A aortic dissection- s/p repair.   Recovering well considering 3. HTN -  Balancing act keeping BP down but not too low.  Volume does not appear excessive peripherally -  Getting lasix 40 IV  Just PRN  Now we know that she will respond to lasix- would continue with PRN dosing - none yesterday.  ON amlodipine 10 and metoprolol 25 BID as well as PRN hydralazine- headache could be due to BP 4. Hypokalemia- will replete carefully given AKI and oliguria- given 40 yesterday-  Will stop the 40 to be given today 5. DM- per primary. 6. Thrombocytopenia- improving 7. Tobacco use- may explain some of the hypoxia  8. Metabolic acidosis-  Started oral bicarb- improving 9. Anemia-  Has not been an issue requiring treatment     Louis Meckel    Labs: Basic Metabolic Panel: Recent Labs  Lab 12/05/20 0430  12/06/20 0500 12/07/20 0328  NA 141 143 143  K 3.8 3.7 4.2  CL 108 107 111  CO2 21* 23 24  GLUCOSE 91 95 102*  BUN 46* 51* 52*  CREATININE 3.54* 3.55* 3.70*  CALCIUM 8.0* 8.1* 8.2*   Liver Function Tests: Recent Labs  Lab 12/02/20 0550  AST 17  ALT 13  ALKPHOS 63  BILITOT 0.4  PROT 6.8  ALBUMIN 3.5   Recent Labs  Lab 12/02/20 0550  LIPASE 21   No results for input(s): AMMONIA in the last 168 hours. CBC: Recent Labs  Lab 12/03/20 0829 12/03/20 0916 12/03/20 1604 12/03/20 2152 12/04/20 0227 12/06/20 0500 12/07/20 0328  WBC 18.0*  --  17.9*  --  17.7* 10.5 8.0  HGB 12.9   < > 12.6   < > 12.2 11.2* 10.7*  HCT 37.9   < > 34.8*   < > 35.5* 34.2* 32.3*  MCV 90.5  --  88.8  --  90.8 94.2 94.7  PLT 94*  --  89*  --  80* 102* 119*   < > = values in this interval not displayed.   Cardiac Enzymes: No results for input(s): CKTOTAL, CKMB, CKMBINDEX, TROPONINI in the last 168 hours. CBG: Recent Labs  Lab 12/06/20 1157 12/06/20 1541 12/06/20  1924 12/06/20 2330 12/07/20 0326  GLUCAP 104* 111* 112* 111* 102*    Iron Studies: No results for input(s): IRON, TIBC, TRANSFERRIN, FERRITIN in the last 72 hours. Studies/Results: DG CHEST PORT 1 VIEW  Result Date: 12/06/2020 CLINICAL DATA:  Shortness of breath. EXAM: PORTABLE CHEST 1 VIEW COMPARISON:  12/05/2020 FINDINGS: 0616 hours. The cardio pericardial silhouette is enlarged. 2 mediastinal/pericardial drains remain in place. Right IJ sheath tip overlies the mid SVC region. Vascular congestion with left greater than right basilar collapse/consolidation again noted. Small bilateral pleural effusions persist. IMPRESSION: 1. No substantial interval change. 2. Persistent left greater than right basilar collapse/consolidation with small bilateral pleural effusions. Electronically Signed   By: Misty Stanley M.D.   On: 12/06/2020 08:07   DG CHEST PORT 1 VIEW  Result Date: 12/05/2020 CLINICAL DATA:  Shortness of breath.   Right-sided thoracentesis EXAM: PORTABLE CHEST 1 VIEW COMPARISON:  12/04/2020 FINDINGS: Right IJ sheath remains position within the SVC. Median sternotomy wires. Left chest tube and mediastinal drain remain in place. Stable cardiomegaly. Small bilateral pleural effusions, decreased on the right. Patchy bibasilar opacities, similar to prior. No pneumothorax. IMPRESSION: 1. Small bilateral pleural effusions, decreased on the right. No pneumothorax. 2. Patchy bibasilar opacities, similar to prior. Electronically Signed   By: Davina Poke D.O.   On: 12/05/2020 13:08   Medications: Infusions: . sodium chloride    . sodium chloride Stopped (12/03/20 0832)  . sodium chloride 20 mL/hr at 12/02/20 1618  . lactated ringers Stopped (12/02/20 1515)  . lactated ringers 10 mL/hr at 12/07/20 0400  . magnesium sulfate Stopped (12/02/20 1515)    Scheduled Medications: . sodium chloride   Intravenous Once  . acetaminophen  1,000 mg Oral Q6H   Or  . acetaminophen (TYLENOL) oral liquid 160 mg/5 mL  1,000 mg Per Tube Q6H  . amLODipine  10 mg Oral Daily  . aspirin EC  325 mg Oral Daily   Or  . aspirin  324 mg Per Tube Daily  . atorvastatin  10 mg Oral Daily  . bisacodyl  10 mg Oral Daily   Or  . bisacodyl  10 mg Rectal Daily  . chlorhexidine  15 mL Mouth Rinse BID  . Chlorhexidine Gluconate Cloth  6 each Topical Daily  . docusate sodium  200 mg Oral Daily  . enoxaparin (LOVENOX) injection  30 mg Subcutaneous QHS  . gabapentin  300 mg Oral QHS  . levalbuterol  0.63 mg Nebulization BID  . mouth rinse  15 mL Mouth Rinse q12n4p  . metoprolol tartrate  25 mg Oral BID  . pantoprazole  40 mg Oral Daily  . potassium chloride  20 mEq Oral BID  . sodium bicarbonate  650 mg Oral BID  . sodium chloride flush  10-40 mL Intracatheter Q12H  . sodium chloride flush  3 mL Intravenous Q12H    have reviewed scheduled and prn medications.  Physical Exam: General: alert, NAD Heart: RRR Lungs: poor effort, dec  BS at bases- sternal incision  Abdomen: soft, non tender Extremities: no peripheral edema     12/07/2020,7:52 AM  LOS: 5 days

## 2020-12-08 ENCOUNTER — Inpatient Hospital Stay (HOSPITAL_COMMUNITY): Payer: Medicare (Managed Care)

## 2020-12-08 LAB — GLUCOSE, CAPILLARY
Glucose-Capillary: 92 mg/dL (ref 70–99)
Glucose-Capillary: 103 mg/dL — ABNORMAL HIGH (ref 70–99)
Glucose-Capillary: 119 mg/dL — ABNORMAL HIGH (ref 70–99)
Glucose-Capillary: 144 mg/dL — ABNORMAL HIGH (ref 70–99)
Glucose-Capillary: 112 mg/dL — ABNORMAL HIGH (ref 70–99)

## 2020-12-08 LAB — BASIC METABOLIC PANEL
Anion gap: 12 (ref 5–15)
BUN: 44 mg/dL — ABNORMAL HIGH (ref 8–23)
CO2: 24 mmol/L (ref 22–32)
Calcium: 8.9 mg/dL (ref 8.9–10.3)
Chloride: 108 mmol/L (ref 98–111)
Creatinine, Ser: 3.18 mg/dL — ABNORMAL HIGH (ref 0.44–1.00)
GFR, Estimated: 16 mL/min — ABNORMAL LOW (ref >60)
Glucose, Bld: 104 mg/dL — ABNORMAL HIGH (ref 70–99)
Potassium: 3.9 mmol/L (ref 3.5–5.1)
Sodium: 144 mmol/L (ref 135–145)

## 2020-12-08 MED ORDER — CLONIDINE HCL 0.1 MG PO TABS
0.2000 mg | ORAL_TABLET | Freq: Two times a day (BID) | ORAL | Status: DC
  Administered 2020-12-08 – 2020-12-12 (×9): 0.2 mg via ORAL
  Filled 2020-12-08 (×8): qty 2
  Filled 2020-12-08: qty 1
  Filled 2020-12-08 (×2): qty 2

## 2020-12-08 MED ORDER — ~~LOC~~ CARDIAC SURGERY, PATIENT & FAMILY EDUCATION
Freq: Once | Status: DC
Start: 2020-12-08 — End: 2020-12-12

## 2020-12-08 MED ORDER — ACETAMINOPHEN 500 MG PO TABS: 500.0000 mg | ORAL_TABLET | Freq: Four times a day (QID) | ORAL | Status: DC | PRN

## 2020-12-08 MED ORDER — SODIUM CHLORIDE 0.9% FLUSH
3.0000 mL | INTRAVENOUS | Status: DC | PRN
  Administered 2020-12-08: 10:00:00 3 mL via INTRAVENOUS

## 2020-12-08 MED ORDER — SODIUM CHLORIDE 0.9 % IV SOLN: 250.0000 mL | INTRAVENOUS | Status: DC | PRN

## 2020-12-08 MED ORDER — SODIUM CHLORIDE 0.9% FLUSH
3.0000 mL | Freq: Two times a day (BID) | INTRAVENOUS | Status: DC
Start: 2020-12-08 — End: 2020-12-12
  Administered 2020-12-08 – 2020-12-11 (×6): 3 mL via INTRAVENOUS

## 2020-12-08 MED ORDER — METOPROLOL TARTRATE 50 MG PO TABS
50.0000 mg | ORAL_TABLET | Freq: Two times a day (BID) | ORAL | Status: DC
  Administered 2020-12-08 – 2020-12-12 (×9): 50 mg via ORAL
  Filled 2020-12-08 (×9): qty 1

## 2020-12-08 NOTE — Progress Notes (Signed)
Pt ambulated to bathroom, RN had to leave room to attended to another patient, on the way back assisting patient to bed from bathroom, pt yelled at RN for not changing bed pad. RN apologized and explained to the patient that I had to go check on my other patient but I would be more then glad to change it. Pt yelled at RN saying "No you should have changed it earlier, now I do not want you to change it".

## 2020-12-08 NOTE — Progress Notes (Signed)
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 Pt was also hypotensive and received IV contrast with emergent CT angio. She has advanced CKD at baseline and unclear how much renal function she will recover. No exact urine output monitoring however creatinine level improving to 3.18 today.  No indication for dialysis.  Hopefully she will continue to have renal recovery.  # Type A aortic dissection- s/p repair.  Recovering well.  #HTN: Blood pressure elevated however volume status is acceptable.  She is already on amlodipine, clonidine and metoprolol.  I will increase clonidine dose today.  Monitor BP.  #Hypokalemia-required repletion.  Potassium level acceptable today.  Continue to monitor.    #DM- per primary.  # Thrombocytopenia- improving  # Metabolic acidosis-  improved with sodium bicarbonate.  # Anemia: Hemoglobin fairly stable.  Subjective: Seen and examined.  She goes to bathroom therefore no exact urine output measurement.  Complains of some headache.  No nausea, vomiting, chest pain or shortness of breath. Objective Vital signs in last 24 hours: Vitals:   12/08/20 0400 12/08/20 0500 12/08/20 0600 12/08/20 0700  BP: (!) 165/94 (!) 167/99 (!) 177/101 (!) 170/92  Pulse: 82 79 89 87  Resp: 18 11 20 17   Temp: 97.8 F (36.6 C)     TempSrc: Oral     SpO2: 93% 92% 94% 93%  Weight:  65 kg    Height:       Weight change: 2 kg  Intake/Output Summary (Last 24 hours) at 12/08/2020 0751 Last data filed at 12/08/2020 0600 Gross per 24 hour  Intake 60.86 ml  Output 200 ml  Net -139.14 ml       Labs: Basic Metabolic Panel: Recent Labs  Lab 12/06/20 0500 12/07/20 0328 12/08/20 0616  NA 143 143 144  K 3.7 4.2 3.9  CL 107 111 108  CO2 23 24 24    GLUCOSE 95 102* 104*  BUN 51* 52* 44*  CREATININE 3.55* 3.70* 3.18*  CALCIUM 8.1* 8.2* 8.9   Liver Function Tests: Recent Labs  Lab 12/02/20 0550  AST 17  ALT 13  ALKPHOS 63  BILITOT 0.4  PROT 6.8  ALBUMIN 3.5   Recent Labs  Lab 12/02/20 0550  LIPASE 21   No results for input(s): AMMONIA in the last 168 hours. CBC: Recent Labs  Lab 12/03/20 0829 12/03/20 0916 12/03/20 1604 12/03/20 2152 12/04/20 0227 12/06/20 0500 12/07/20 0328  WBC 18.0*  --  17.9*  --  17.7* 10.5 8.0  HGB 12.9   < > 12.6   < > 12.2 11.2* 10.7*  HCT 37.9   < > 34.8*   < > 35.5* 34.2* 32.3*  MCV 90.5  --  88.8  --  90.8 94.2 94.7  PLT 94*  --  89*  --  80* 102* 119*   < > = values in this interval not displayed.   Cardiac Enzymes: No results for input(s): CKTOTAL, CKMB, CKMBINDEX, TROPONINI in the last 168 hours. CBG: Recent Labs  Lab 12/07/20 1137 12/07/20 1554 12/07/20 1939 12/07/20 2325 12/08/20 0428  GLUCAP 107* 99 109* 109* 92    Iron Studies: No results for input(s): IRON, TIBC, TRANSFERRIN, FERRITIN in the last 72 hours. Studies/Results: DG CHEST PORT 1 VIEW  Result Date: 12/08/2020 CLINICAL DATA:  Aortic dissection.  EXAM: PORTABLE CHEST 1 VIEW COMPARISON:  December 06, 2020 FINDINGS: Slight increase in a moderate left pleural effusion. Suspected layering small right pleural effusion. No visible pneumothorax. Similar bibasilar opacities. Cardiac silhouette is largely obscured. Median sternotomy. IMPRESSION: Slightly increased moderate left pleural effusion with suspected layering small right pleural effusion. Similar overlying bibasilar opacities. Electronically Signed   By: Margaretha Sheffield MD   On: 12/08/2020 07:42    Medications: Infusions: . sodium chloride    . sodium chloride Stopped (12/03/20 0832)  . sodium chloride 20 mL/hr at 12/02/20 1618  . lactated ringers Stopped (12/02/20 1515)  . lactated ringers Stopped (12/07/20 1019)  . magnesium sulfate Stopped (12/02/20  1515)    Scheduled Medications: . sodium chloride   Intravenous Once  . amLODipine  10 mg Oral Daily  . aspirin EC  325 mg Oral Daily   Or  . aspirin  324 mg Per Tube Daily  . atorvastatin  10 mg Oral Daily  . bisacodyl  10 mg Oral Daily   Or  . bisacodyl  10 mg Rectal Daily  . chlorhexidine  15 mL Mouth Rinse BID  . Chlorhexidine Gluconate Cloth  6 each Topical Daily  . cloNIDine  0.1 mg Oral BID  . docusate sodium  200 mg Oral Daily  . enoxaparin (LOVENOX) injection  30 mg Subcutaneous QHS  . gabapentin  300 mg Oral QHS  . levalbuterol  0.63 mg Nebulization BID  . mouth rinse  15 mL Mouth Rinse q12n4p  . metoprolol tartrate  25 mg Oral BID  . pantoprazole  40 mg Oral Daily  . sodium bicarbonate  650 mg Oral BID  . sodium chloride flush  10-40 mL Intracatheter Q12H  . sodium chloride flush  3 mL Intravenous Q12H    have reviewed scheduled and prn medications.  Physical Exam: General:NAD, comfortable Heart:RRR, s1s2 nl Lungs:clear b/l, no crackle Abdomen:soft, Non-tender, non-distended Extremities:No edema Dialysis Access: None  Chelle Cayton Prasad Mazzy Santarelli 12/08/2020,7:51 AM  LOS: 6 days  Pager: [phone]

## 2020-12-08 NOTE — Progress Notes (Addendum)
[address]       [place], [address]             [phone]                 6 Days Post-Op Procedure(s) (LRB): REPAIR OF ACUTE ASCENDING THORACIC AORTIC DISSECTION VIA CIRC ARREST USING HEMASHIELD PLATINUM 28 MM VASCULAR GRAFT. (N/A) TRANSESOPHAGEAL ECHOCARDIOGRAM (TEE)   Events: No overnight events.  This morning reporting headache without neuro sx or deficits.   Denies chest pain or shortness of breath.   _______________________________________________________________ Vitals: BP (!) 165/96   Pulse 89   Temp 97.8 F (36.6 C) (Oral)   Resp (!) 21   Ht 5\' 8"  (1.727 m)   Wt 65 kg   SpO2 92%   BMI 21.79 kg/m   - Neuro: Alert and oriented. Cooperative.   Reporting headache without neuro sx or deficits.     - Cardiovascular: Regular rate and rhythm.  SBP 130-170s.  NSR on telemetry. Rates 80s.  Personally reviewed.       - Pulm: Diminished to LLL, otherwise clear.  100% on RA.       ABG    Component Value Date/Time   PHART 7.400 12/04/2020 0214   PCO2ART 32.6 12/04/2020 0214   PO2ART 58 (L) 12/04/2020 0214   HCO3 20.3 12/04/2020 0214   TCO2 21 (L) 12/04/2020 0214   ACIDBASEDEF 4.0 (H) 12/04/2020 0214   O2SAT 74.3 12/07/2020 0328   - Abd: Soft, non-distended.    - Extremity: No edema.  Moving all extremities w/o difficulty.    .Intake/Output      12/26 0701 12/27 0700 12/27 0701 12/28 0700   I.V. (mL/kg) 60.9 (0.9)    Total Intake(mL/kg) 60.9 (0.9)    Urine (mL/kg/hr) 200 (0.1)    Total Output 200    Net -139.1         Urine Occurrence 2 x    Stool Occurrence 1 x       _______________________________________________________________ Labs: CBC Latest Ref Rng & Units 12/07/2020 12/06/2020 12/04/2020  WBC 4.0 - 10.5 K/uL 8.0 10.5 17.7(H)  Hemoglobin 12.0 - 15.0 g/dL 10.7(L) 11.2(L) 12.2  Hematocrit 36.0 - 46.0 % 32.3(L) 34.2(L) 35.5(L)  Platelets 150 - 400 K/uL 119(L) 102(L) 80(L)   CMP Latest Ref Rng & Units 12/08/2020  12/07/2020 12/06/2020  Glucose 70 - 99 mg/dL 104(H) 102(H) 95  BUN 8 - 23 mg/dL 44(H) 52(H) 51(H)  Creatinine 0.44 - 1.00 mg/dL 3.18(H) 3.70(H) 3.55(H)  Sodium 135 - 145 mmol/L 144 143 143  Potassium 3.5 - 5.1 mmol/L 3.9 4.2 3.7  Chloride 98 - 111 mmol/L 108 111 107  CO2 22 - 32 mmol/L 24 24 23   Calcium 8.9 - 10.3 mg/dL 8.9 8.2(L) 8.1(L)  Total Protein 6.5 - 8.1 g/dL - - -  Total Bilirubin 0.3 - 1.2 mg/dL - - -  Alkaline Phos 38 - 126 U/L - - -  AST 15 - 41 U/L - - -  ALT 0 - 44 U/L - - -    CXR: Moderate size effusion noted to left lower lobe.   _______________________________________________________________  Assessment and Plan: POD #6  s/p repair of acute ascending thoracic aortic dissection.    Neuro: Headache w/o neuro sx: continue w/ tramadol and tylenol PRN pain.  CV: NSR, continue to monitor on telemetry; EF 50-55% w/ mod LVH.  Hypertensive currently SBP 130-170s, address pain. Continue with norvasc 10 mg, metoprolol 25 BID,  clonidine 0.1 BID. Hydralazine PRN for acute episodes.  Will consult HTN specialist.  Continue with ASA 325 mg and Lipitor 10 mg.    Pulm: R effusion s/p 600 mL thoracentesis on 12/24.  Moderate size L effusion on CXR, currently 100% RA, denies SOB or CP.  Continue to monitor.  Use ISB.  Renal: Acute on CKD, baseline Cr 1.5-1.7.  Cr peak at 3.7. per RN staff good UOP using toilet.   Nephrology consulted, no indications for dialysis.    GI: Tolerating PO intake.  Continue heart health/carb diet.   Heme: Hgb stable 10.7 on 12/26 w/ platelets improving to 119.  No signs of bleeding.   ID: No fever, WBC 8.0 on 12/26. Incision looks healed and intact, no redness, swelling or drainage.    Endo: Glucose 104. A1c 5.3%.  Stable, off insulin.    Dispo: Txf 2C.   Arlyss Gandy, NP 12/08/2020 8:17 AM   Agree with above Increasing BB today for HTN On room air.  Continue pulm toilet.  Will discuss thoracentesis on the left Pt making urine.  Will continue  to watch creat On diet Will transfer to [place]

## 2020-12-08 NOTE — Progress Notes (Signed)
Charge nurse went in to bring patient pain medicine as the patient's nurse was tied up in another room. RN gave pain medicine and patient asked to switch the lights to a less bright switch but I did not hear as the patient said it softly the first time. The patient then preceded to scream in an aggressive tone "the light" x3 "I need you to fix the light". I informed the patient that she is not going to scream at me to which she replied she was not screaming she was simply speak loudly. I informed her that she was indeed screaming and that she was not going to do that. She continued to deny this. I began to walk out of the room and she asked me to pull the curtain. I pulled the curtain and left. Pamala Hurry RN heard the screams from across the hall.

## 2020-12-08 NOTE — Plan of Care (Signed)

## 2020-12-09 ENCOUNTER — Inpatient Hospital Stay (HOSPITAL_COMMUNITY): Payer: Medicare (Managed Care)

## 2020-12-09 HISTORY — PX: IR THORACENTESIS ASP PLEURAL SPACE W/IMG GUIDE: IMG5380

## 2020-12-09 LAB — BASIC METABOLIC PANEL
Anion gap: 9 (ref 5–15)
BUN: 40 mg/dL — ABNORMAL HIGH (ref 8–23)
CO2: 23 mmol/L (ref 22–32)
Calcium: 8.4 mg/dL — ABNORMAL LOW (ref 8.9–10.3)
Chloride: 109 mmol/L (ref 98–111)
Creatinine, Ser: 2.84 mg/dL — ABNORMAL HIGH (ref 0.44–1.00)
GFR, Estimated: 18 mL/min — ABNORMAL LOW (ref >60)
Glucose, Bld: 119 mg/dL — ABNORMAL HIGH (ref 70–99)
Potassium: 4.3 mmol/L (ref 3.5–5.1)
Sodium: 141 mmol/L (ref 135–145)

## 2020-12-09 LAB — CBC
HCT: 34.4 % — ABNORMAL LOW (ref 36.0–46.0)
Hemoglobin: 10.8 g/dL — ABNORMAL LOW (ref 12.0–15.0)
MCH: 30.4 pg (ref 26.0–34.0)
MCHC: 31.4 g/dL (ref 30.0–36.0)
MCV: 96.9 fL (ref 80.0–100.0)
Platelets: 181 10*3/uL (ref 150–400)
RBC: 3.55 MIL/uL — ABNORMAL LOW (ref 3.87–5.11)
RDW: 13.8 % (ref 11.5–15.5)
WBC: 9.1 10*3/uL (ref 4.0–10.5)
nRBC: 0 % (ref 0.0–0.2)

## 2020-12-09 LAB — GLUCOSE, CAPILLARY
Glucose-Capillary: 97 mg/dL (ref 70–99)
Glucose-Capillary: 124 mg/dL — ABNORMAL HIGH (ref 70–99)
Glucose-Capillary: 103 mg/dL — ABNORMAL HIGH (ref 70–99)
Glucose-Capillary: 117 mg/dL — ABNORMAL HIGH (ref 70–99)

## 2020-12-09 MED ORDER — ENOXAPARIN SODIUM 30 MG/0.3ML ~~LOC~~ SOLN
30.0000 mg | SUBCUTANEOUS | Status: DC
  Administered 2020-12-09 – 2020-12-11 (×3): 30 mg via SUBCUTANEOUS
  Filled 2020-12-09 (×3): qty 0.3

## 2020-12-09 MED ORDER — LEVALBUTEROL HCL 0.63 MG/3ML IN NEBU: 0.6300 mg | INHALATION_SOLUTION | Freq: Four times a day (QID) | RESPIRATORY_TRACT | Status: DC | PRN

## 2020-12-09 MED ORDER — LIDOCAINE HCL 1 % IJ SOLN
INTRAMUSCULAR | Status: AC | PRN
  Administered 2020-12-09: 20 mL via INTRADERMAL

## 2020-12-09 MED ORDER — LIDOCAINE HCL 1 % IJ SOLN
INTRAMUSCULAR | Status: AC
  Filled 2020-12-09: qty 20

## 2020-12-09 NOTE — Progress Notes (Addendum)
      [address]       [place] [address]             [phone]      7 Days Post-Op Procedure(s) (LRB): REPAIR OF ACUTE ASCENDING THORACIC AORTIC DISSECTION VIA CIRC ARREST USING HEMASHIELD PLATINUM 28 MM VASCULAR GRAFT. (N/A) TRANSESOPHAGEAL ECHOCARDIOGRAM (TEE)   Subjective:  No new complaints.  States currently has no discomfort.  She also feels like her breathing is starting to return to normal.  Wants to get better and back to her baseline  + ambulation  + BM  Objective: Vital signs in last 24 hours: Temp:  [98 F (36.7 C)-98.5 F (36.9 C)] 98.4 F (36.9 C) (12/28 0300) Pulse Rate:  [70-96] 81 (12/28 0300) Cardiac Rhythm: Normal sinus rhythm (12/28 0701) Resp:  [12-24] 12 (12/28 0300) BP: (120-177)/(82-100) 143/84 (12/28 0300) SpO2:  [85 %-95 %] 93 % (12/28 0300) Weight:  [62.7 kg] 62.7 kg (12/28 0300)  Intake/Output from previous day: 12/27 0701 - 12/28 0700 In: 240 [P.O.:240] Out: -   General appearance: alert, cooperative and no distress Heart: regular rate and rhythm and + systolic murmur Lungs: diminished breath sounds bibasilar Abdomen: soft, non-tender; bowel sounds normal; no masses,  no organomegaly Extremities: extremities normal, atraumatic, no cyanosis or edema Wound: clean and dry  Lab Results: Recent Labs    12/07/20 0328 12/09/20 0027  WBC 8.0 9.1  HGB 10.7* 10.8*  HCT 32.3* 34.4*  PLT 119* 181   BMET:  Recent Labs    12/08/20 0616 12/09/20 0027  NA 144 141  K 3.9 4.3  CL 108 109  CO2 24 23  GLUCOSE 104* 119*  BUN 44* 40*  CREATININE 3.18* 2.84*  CALCIUM 8.9 8.4*    PT/INR: No results for input(s): LABPROT, INR in the last 72 hours. ABG    Component Value Date/Time   PHART 7.400 12/04/2020 0214   HCO3 20.3 12/04/2020 0214   TCO2 21 (L) 12/04/2020 0214   ACIDBASEDEF 4.0 (H) 12/04/2020 0214   O2SAT 74.3 12/07/2020 0328   CBG (last 3)  Recent Labs    12/08/20 1607 12/08/20 2100 12/09/20 0626  GLUCAP  144* 112* 97    Assessment/Plan: S/P Procedure(s) (LRB): REPAIR OF ACUTE ASCENDING THORACIC AORTIC DISSECTION VIA CIRC ARREST USING HEMASHIELD PLATINUM 28 MM VASCULAR GRAFT. (N/A) TRANSESOPHAGEAL ECHOCARDIOGRAM (TEE)  1. CV- NSR, + HTN, SBP in the 140s this morning, acceptable with underlying CKD-- on Lopressor, Norvasc, Clonidine titrated by Nephrology yesterday 2. Pulm- weaning oxygen as tolerated, left pleural effusion, would benefit from thoracentesis if patient is willing to consent, repeat CXR in AM 3. Renal- AKI, on CKD- creatinine down to 2.46, Nephrology following 4. Lovenox for DVT prophylaxis 5. Deconditioning- patient lives alone, get PT/OT eval for SNF placement at discharge 6. Dispo- patient stable, creatinine improving, SBP improved, continue current care, await evaluations for SNF placement, TOC consult placed   LOS: 7 days    [name], PA-C 12/09/2020  Agree with above. Thoracentesis ordered for the left side. Creatinine improving Continue physical therapy. Dispel planning.  Lomax Poehler Bary Leriche

## 2020-12-09 NOTE — Procedures (Signed)
PROCEDURE SUMMARY:  Successful US guided left thoracentesis. Yielded 650 ml of amber-colored fluid. Pt tolerated procedure well. No immediate complications.  CXR ordered; no post-procedure pneumothorax identified  EBL < 2 mL  Theresa Duty, NP 12/09/2020 4:05 PM

## 2020-12-09 NOTE — Plan of Care (Signed)

## 2020-12-09 NOTE — Evaluation (Signed)
Physical Therapy Evaluation Patient Details Name: Mary Stephens MRN: [id number] DOB: [birth year] Today's Date: 12/09/2020   History of Present Illness  64 y.o. female presents to the emergency department 12/21 with acute onset chest and back pain as well as R LE paresthesia resulting in fall. Pt had similar symptoms a week prior and left ED AMA. 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 Right lower extremity shows signs of ischemia. s/p type A dissection repair 12/21. s/p 12/24 R thoracentesis.  Clinical Impression  PTA pt living alone in apartment with 4 steps to enter. Pt report independence with mobility without AD, as well as ADLs and iADLs. Relies on [facility] for transportation. Pt is currently limited in safe mobility by decreased strength, balance and endurance, in presence of decreased safety awareness. Pt requires modA for bed mobility while maintaining sternal precautions and modA for transfers and lateral stepping with HHA. Deferred RW usage due to pt desire to pull up against RW despite cuing for sternal precaution. PT recommending SNF level rehab prior to return home to improve mobility and safety. PT will continue to follow acutely.     Follow Up Recommendations SNF    Equipment Recommendations  Rolling walker with 5" wheels    Recommendations for Other Services OT consult     Precautions / Restrictions Precautions Precautions: Fall;Sternal Precaution Comments: provided education about moving in tube.Will bring sternal precaution handout next session Restrictions Weight Bearing Restrictions: Yes      Mobility  Bed Mobility Overal bed mobility: Needs Assistance Bed Mobility: Rolling;Sidelying to Sit;Sit to Sidelying Rolling: Min assist Sidelying to sit: Mod assist     Sit to sidelying: Mod assist General bed mobility comments: min A for rolling onto R side, able to manage LE off bed, requires modA for bringing  trunk to upright, requires modA for returning LE to bed    Transfers Overall transfer level: Needs assistance Equipment used: 1 person hand held assist Transfers: Sit to/from Stand Sit to Stand: Min assist;From elevated surface         General transfer comment: min A for power up and steadying with face forward assist, cuing for hands on knees to push up  Ambulation/Gait Ambulation/Gait assistance: Min assist Gait Distance (Feet): 3 Feet Assistive device: 1 person hand held assist Gait Pattern/deviations: Step-to pattern;Decreased step length - right;Decreased step length - left;Shuffle Gait velocity: slowed Gait velocity interpretation: <1.31 ft/sec, indicative of household ambulator General Gait Details: min A for steadying with sidestepping towards HoB        Balance Overall balance assessment: Needs assistance Sitting-balance support: Feet supported;No upper extremity supported Sitting balance-Leahy Scale: Fair     Standing balance support: Bilateral upper extremity supported Standing balance-Leahy Scale: Poor                               Pertinent Vitals/Pain Pain Assessment: No/denies pain    Home Living Family/patient expects to be discharged to:: Private residence Living Arrangements: Alone Available Help at Discharge: Family;Available 24 hours/day Type of Home: Apartment Home Access: Stairs to enter Entrance Stairs-Rails: Can reach both Entrance Stairs-Number of Steps: 4 Home Layout: One level Home Equipment: None      Prior Function Level of Independence: Independent         Comments: [facility] assists with transportation        Extremity/Trunk Assessment   Upper Extremity  Assessment Upper Extremity Assessment: Overall WFL for tasks assessed    Lower Extremity Assessment Lower Extremity Assessment: RLE deficits/detail;LLE deficits/detail RLE Deficits / Details: ROM WFL, strength grossly 4/5 RLE Sensation: WNL (no longer  experiencing paresthesia) LLE Deficits / Details: ROM WFL, strength grossly 4/5 LLE Sensation: WNL       Communication   Communication: No difficulties  Cognition Arousal/Alertness: Awake/alert Behavior During Therapy: WFL for tasks assessed/performed;Flat affect Overall Cognitive Status: Within Functional Limits for tasks assessed                                 General Comments: pt frustrated with not being as independent as she was before she came to the hospital      General Comments General comments (skin integrity, edema, etc.): in supine BP 131/88 with transition to seated BP 132/89, pt with c/o of dizziness which dissipated quickly, SaO2 on 2L O2 via [place] >90%O2 throughout session        Assessment/Plan    PT Assessment Patient needs continued PT services  PT Problem List Decreased strength;Decreased activity tolerance;Decreased balance;Decreased mobility;Cardiopulmonary status limiting activity;Decreased safety awareness;Decreased knowledge of use of DME       PT Treatment Interventions DME instruction;Gait training;Functional mobility training;Therapeutic activities;Therapeutic exercise;Balance training;Patient/family education    PT Goals (Current goals can be found in the Care Plan section)  Acute Rehab PT Goals Patient Stated Goal: get her independence back PT Goal Formulation: With patient Time For Goal Achievement: 12/23/20 Potential to Achieve Goals: Good    Frequency Min 2X/week   Barriers to discharge Decreased caregiver support thinks she could get someone to stay with her but is not definitive       AM-PAC PT "6 Clicks" Mobility  Outcome Measure Help needed turning from your back to your side while in a flat bed without using bedrails?: A Little Help needed moving from lying on your back to sitting on the side of a flat bed without using bedrails?: A Lot Help needed moving to and from a bed to a chair (including a wheelchair)?: A  Lot Help needed standing up from a chair using your arms (e.g., wheelchair or bedside chair)?: A Lot Help needed to walk in hospital room?: A Lot Help needed climbing 3-5 steps with a railing? : A Lot 6 Click Score: 13    End of Session Equipment Utilized During Treatment: Oxygen Activity Tolerance: Patient limited by fatigue Patient left: in bed;with call bell/phone within reach;with bed alarm set Nurse Communication: Mobility status PT Visit Diagnosis: Unsteadiness on feet (R26.81);Other abnormalities of gait and mobility (R26.89);Muscle weakness (generalized) (M62.81)    Time: 4403-4742 PT Time Calculation (min) (ACUTE ONLY): 21 min   Charges:   PT Evaluation $PT Eval Moderate Complexity: 1 Mod          [place] B. Beverely Risen PT, DPT Acute Rehabilitation Services Pager [phone] Office [phone]   [facility] 12/09/2020, 12:43 PM

## 2020-12-09 NOTE — Progress Notes (Signed)
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 Pt was also hypotensive and received IV contrast with emergent CT angio. She has advanced CKD at baseline and unclear how much renal function she will recover. No exact urine output monitoring however creatinine level continue to improve to 2.84 today.  No indication for dialysis.  Hopefully, she will continue to have renal recovery to her baseline.  She will follow up with Dr. Posey Pronto at [facility].  # Type A aortic dissection- s/p repair.  Recovering well.  #HTN: Blood pressure elevated however volume status is acceptable.  She is already on amlodipine, clonidine and metoprolol.  I  increased clonidine dose.  Monitor BP.  #Hypokalemia-required repletion.  Potassium level acceptable today.  Continue to monitor.    #DM- per primary.  # Thrombocytopenia- improved  # Metabolic acidosis-  improved with sodium bicarbonate.  # Anemia: Hemoglobin fairly stable.  She has renal recovery, nothing further to add.  Sign off, please call back with question.  Subjective: Seen and examined.  Transferred out of ICU.  Labs improving.  Denies nausea, vomiting, chest pain, shortness of breath.  No new event.  Objective Vital signs in last 24 hours: Vitals:   12/08/20 2100 12/08/20 2330 12/09/20 0300 12/09/20 0733  BP: (!) 154/87 120/82 (!) 143/84 (!) 153/84  Pulse: 91 78 81 87  Resp: 16 17 12 16   Temp:  98 F (36.7 C) 98.4 F (36.9 C) 98.3 F (36.8 C)  TempSrc:  Oral Oral Oral  SpO2: 93% 94% 93%   Weight:   62.7 kg   Height:       Weight change: -2.313 kg  Intake/Output Summary (Last 24 hours) at 12/09/2020 0806 Last data filed at 12/09/2020 0356 Gross per 24 hour  Intake 240 ml  Output --  Net 240 ml       Labs: Basic  Metabolic Panel: Recent Labs  Lab 12/07/20 0328 12/08/20 0616 12/09/20 0027  NA 143 144 141  K 4.2 3.9 4.3  CL 111 108 109  CO2 24 24 23   GLUCOSE 102* 104* 119*  BUN 52* 44* 40*  CREATININE 3.70* 3.18* 2.84*  CALCIUM 8.2* 8.9 8.4*   Liver Function Tests: No results for input(s): AST, ALT, ALKPHOS, BILITOT, PROT, ALBUMIN in the last 168 hours. No results for input(s): LIPASE, AMYLASE in the last 168 hours. No results for input(s): AMMONIA in the last 168 hours. CBC: Recent Labs  Lab 12/03/20 1604 12/03/20 2152 12/04/20 0227 12/06/20 0500 12/07/20 0328 12/09/20 0027  WBC 17.9*  --  17.7* 10.5 8.0 9.1  HGB 12.6   < > 12.2 11.2* 10.7* 10.8*  HCT 34.8*   < > 35.5* 34.2* 32.3* 34.4*  MCV 88.8  --  90.8 94.2 94.7 96.9  PLT 89*  --  80* 102* 119* 181   < > = values in this interval not displayed.   Cardiac Enzymes: No results for input(s): CKTOTAL, CKMB, CKMBINDEX, TROPONINI in the last 168 hours. CBG: Recent Labs  Lab 12/08/20 0827 12/08/20 1141 12/08/20 1607 12/08/20 2100 12/09/20 0626  GLUCAP 103* 119* 144* 112* 97    Iron Studies: No results for input(s): IRON, TIBC, TRANSFERRIN, FERRITIN in the last 72 hours. Studies/Results: DG CHEST PORT 1 VIEW  Result Date: 12/08/2020 CLINICAL DATA:  Aortic  dissection. EXAM: PORTABLE CHEST 1 VIEW COMPARISON:  December 06, 2020 FINDINGS: Slight increase in a moderate left pleural effusion. Suspected layering small right pleural effusion. No visible pneumothorax. Similar bibasilar opacities. Cardiac silhouette is largely obscured. Median sternotomy. IMPRESSION: Slightly increased moderate left pleural effusion with suspected layering small right pleural effusion. Similar overlying bibasilar opacities. Electronically Signed   By: Margaretha Sheffield MD   On: 12/08/2020 07:42    Medications: Infusions: . sodium chloride    . sodium chloride Stopped (12/03/20 0832)  . sodium chloride 20 mL/hr at 12/02/20 1618  . sodium chloride     . lactated ringers Stopped (12/02/20 1515)  . lactated ringers Stopped (12/07/20 1019)  . magnesium sulfate Stopped (12/02/20 1515)    Scheduled Medications: . sodium chloride   Intravenous Once  . amLODipine  10 mg Oral Daily  . aspirin EC  325 mg Oral Daily   Or  . aspirin  324 mg Per Tube Daily  . atorvastatin  10 mg Oral Daily  . bisacodyl  10 mg Oral Daily   Or  . bisacodyl  10 mg Rectal Daily  . chlorhexidine  15 mL Mouth Rinse BID  . Chlorhexidine Gluconate Cloth  6 each Topical Daily  . cloNIDine  0.2 mg Oral BID  . [facility] Patient [facility] Family Education   Does not apply Once  . docusate sodium  200 mg Oral Daily  . enoxaparin (LOVENOX) injection  30 mg Subcutaneous Q24H  . gabapentin  300 mg Oral QHS  . levalbuterol  0.63 mg Nebulization BID  . mouth rinse  15 mL Mouth Rinse q12n4p  . metoprolol tartrate  50 mg Oral BID  . pantoprazole  40 mg Oral Daily  . sodium bicarbonate  650 mg Oral BID  . sodium chloride flush  10-40 mL Intracatheter Q12H  . sodium chloride flush  3 mL Intravenous Q12H    have reviewed scheduled and prn medications.  Physical Exam: General:NAD, lying on bed comfortable Heart:RRR, s1s2 nl Lungs: Clear b/l, no crackle Abdomen:soft, Non-tender, non-distended Extremities:No edema Dialysis Access: None  Cyprian [place] Tanna Furry 12/09/2020,8:06 AM  LOS: 7 days  Pager: [phone]

## 2020-12-09 NOTE — Progress Notes (Signed)
PT Cancellation Note  Patient Details Name: DYANA MAGNER MRN: [id number] DOB: Jun 30, 1956   Cancelled Treatment:    Reason Eval/Treat Not Completed: (P) Fatigue/lethargy limiting ability to participate Pt is asleep and RN request not waking her up due to agitation. PT will follow back as able.   Olanda Downie B. Migdalia Dk PT, DPT Acute Rehabilitation Services Pager [phone] Office [phone]    [place] 12/09/2020, 9:50 AM

## 2020-12-09 NOTE — Plan of Care (Signed)
  Problem: Education: °Goal: Knowledge of General Education information will improve °Description: Including pain rating scale, medication(s)/side effects and non-pharmacologic comfort measures °Outcome: Progressing °  °Problem: Health Behavior/Discharge Planning: °Goal: Ability to manage health-related needs will improve °Outcome: Progressing °  °Problem: Clinical Measurements: °Goal: Ability to maintain clinical measurements within normal limits will improve °Outcome: Progressing °Goal: Will remain free from infection °Outcome: Progressing °Goal: Diagnostic test results will improve °Outcome: Progressing °Goal: Respiratory complications will improve °Outcome: Progressing °Goal: Cardiovascular complication will be avoided °Outcome: Progressing °  °Problem: Activity: °Goal: Risk for activity intolerance will decrease °Outcome: Progressing °  °Problem: Nutrition: °Goal: Adequate nutrition will be maintained °Outcome: Progressing °  °Problem: Coping: °Goal: Level of anxiety will decrease °Outcome: Progressing °  °Problem: Elimination: °Goal: Will not experience complications related to bowel motility °Outcome: Progressing °Goal: Will not experience complications related to urinary retention °Outcome: Progressing °  °Problem: Pain Managment: °Goal: General experience of comfort will improve °Outcome: Progressing °  °Problem: Safety: °Goal: Ability to remain free from injury will improve °Outcome: Progressing °  °Problem: Skin Integrity: °Goal: Risk for impaired skin integrity will decrease °Outcome: Progressing °  °Problem: Education: °Goal: Will demonstrate proper wound care and an understanding of methods to prevent future damage °Outcome: Progressing °Goal: Knowledge of disease or condition will improve °Outcome: Progressing °Goal: Knowledge of the prescribed therapeutic regimen will improve °Outcome: Progressing °Goal: Individualized Educational Video(s) °Outcome: Progressing °  °Problem: Activity: °Goal: Risk for  activity intolerance will decrease °Outcome: Progressing °  °Problem: Cardiac: °Goal: Will achieve and/or maintain hemodynamic stability °Outcome: Progressing °  °Problem: Clinical Measurements: °Goal: Postoperative complications will be avoided or minimized °Outcome: Progressing °  °Problem: Respiratory: °Goal: Respiratory status will improve °Outcome: Progressing °  °Problem: Skin Integrity: °Goal: Wound healing without signs and symptoms of infection °Outcome: Progressing °Goal: Risk for impaired skin integrity will decrease °Outcome: Progressing °  °

## 2020-12-09 NOTE — Plan of Care (Signed)

## 2020-12-09 NOTE — Discharge Summary (Signed)
[address]       [place] [address]             [phone]    Physician Discharge Summary  Patient ID: Mary Stephens MRN: [id number] DOB/AGE: 1956/02/22 64 y.o.  Admit date: 12/01/2020 Discharge date: 12/12/2020  Admission Diagnoses:  Patient Active Problem List   Diagnosis Date Noted   Essential hypertension 05/14/2015   Tobacco dependence 12/27/2014   DM (diabetes mellitus), type 2 (HCC) 09/12/2014   Chronic hepatitis C virus infection (HCC) 08/14/2014   Discharge Diagnoses:   Patient Active Problem List   Diagnosis Date Noted   S/P ascending aortic replacement 12/02/2020   Essential hypertension 05/14/2015   Tobacco dependence 12/27/2014   DM (diabetes mellitus), type 2 (HCC) 09/12/2014   Chronic hepatitis C virus infection (HCC) 08/14/2014   Discharged Condition: good  History of Present Illness:  Mary Stephens is a 26 yo AA female with known history of HTN, Diabetes, and Hyperlipidemia.  She presented to the Emergency Department on 11/27/2020 with complaints of chest pain with radiation to the left side of her neck and arm.  The pain was worse with deep inspiration and she was short of breath.  EKG was obtained which showed flipped T waves and trace ST depression.  Her troponin level was 40 and remained unchanged.  D-Dimer was also obtained which was elevated.  She was unable to have a CT scan due to elevated renal function.  Ultimately the patient left the hospital AMA for a second opinion.  She again presented to the ED this morning on 12/02/2020.  She presented with complaints of epigastric/chest pain.  The pain developed suddenly after the patient yelled at her friend.  The patient also developed an episode of lower extremity numbness and weakness in her right leg resulting in a fall.  Due to this the patient presented for evaluation.  CTA of the chest was obtained and revealed a Type A Aortic Dissection.  Cardiothoracic surgery was consulted for  emergent surgical intervention.  Hospital Course:  [name] was taken emergently to the operating room.  She underwent Repair of Aortic Dissection and Hemi-arch replacement of the ascending aorta.  She tolerated the procedure without difficulty and was taken to the SICU in stable condition.  The patient was monitored by Vascular surgery.  She was found to have easily palpable PT pulses bilaterally.  The patient was weaned and extubated on POD #1.  She was weaned off Milrinone as hemodynamics allowed.  The patient's cardene was titrated for additional BP control.  Her creatinine level was up to 3.41.  Nephrology was consulted and they are aware of patient as they evaluated her previously.  They did not feel there was any need for urgent dialysis.  They recommended avoiding nephrotoxic agents.  It was also felt she would benefit from renal duplex in the future.  The patient developed respiratory distress overnight.  She was treated without a non-rebreather.  Her ABG did not improved and she was placed on BIPAP.  The patient immediately removed it stating she couldn't do it.  The patient was placed on a high flow nasal cannula.  She was found to have a pleural effusion and it was recommended the patient undergo Thoracentesis.  Critical care consult was obtained for this.  She unfortunately refused to have the procedure performed.  She was treated Lasix for volume overload.  She remained short of breath.  CXR now showed development  of a right sided pleural effusion.  We again felt Thoracentesis would be indicated.  This time the patient was agreeable to proceed.  This was done at bedside with successful removal of 600 cc of fluid.  Her oxygen requirement improved post procedure.  She remained hypertensive post operatively.  Her medications were adjusted and she was started on Clonidine additional support.  Unfortunately her elevated BP persisted despite Norvasc, Lopressor, and Clonidine.  Her clonidine was further  titrated by Nephrology.  Her CXR continued to show a left sided pleural effusion.  Thoracentesis was again recommended.  This was done on 12/09/2020 and 650 ml of fluid was removed.  She was maintaining NSR and felt medically stable for transfer to the telemetry unit on 12/08/2020.  The patient lives alone.  PT/OT consult was obtained and they recommended SNF placement.  Her creatinine has continued to improve.  Her most recent level was 2.74  She is making urine without difficulty and Nephrology has signed off.  She has decided to not be discharged to SNF.  She is going to stay with her daughter Mary Stephens at discharge.  Home health orders have been placed and arranged.  Her incisions are healing without evidence of infection.  She is felt to be medically stable for discharge today.   Consults: nephrology  Significant Diagnostic Studies:  CT Scan:  Vascular:   1. 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 2. Developing low-attenuation pericardial effusion. Consider echocardiography to exclude features of tamponade. 3. Dissection flap extends into the aortic arch and branch vessels involving the shared origin of the brachiocephalic and left common carotid arteries and the proximal left subclavian artery. Abrupt tapering of the right internal carotid artery arising from what appears to be the false lumen. The right subclavian artery appears largely supplied by the true lumen though somewhat attenuated possibly secondary to luminal narrowing or developing thrombus. Remaining proximal great vessels are more normally opacified. 4. Extension of the dissection flap through the abdominal aorta into the left inflow and proximal outflow vessels, terminating at the level of the left common femoral artery. 5. Dissection flap propagates into the proximal SMA though more distal branches appear normally opacified. 6. Left renal artery arises from the false lumen  with infarct of the majority of the left renal parenchyma only a small portion of the upper pole spared by a tiny accessory renal artery. 7. Attenuation of the IMA origin, distal branch opacification likely supplied via collateral. 8. Abrupt tapering within the proximal right common iliac artery and attenuated opacification of the more distal inflow and proximal outflow vessels.   Nonvascular:   1. Trace bilateral effusions. Bibasilar atelectasis and left basilar scarring. 2. Cholelithiasis without evidence of acute cholecystitis. 3. Prior hysterectomy. 4. Indeterminate 9 mm hyperdense focus along the over lower pole of the left kidney. Could reflect a hyperdense cyst versus solid lesion. Recommend further evaluation with nonemergent renal ultrasound on a nonemergent basis.  Treatments: surgery:   Procedure: Right axillary artery cannulation with 8 mm graft Antegrade cerebral perfusion. Aortic valve resuspension Type A aortic dissection repair with 28mm graft Type A dissection repair with hemiarch.   Surgeon and Role:      * Lightfoot, Eliezer Lofts, MD - Primary    * E. Layne Lebon, PA-C - assisting  Discharge Exam: Blood pressure 137/84, pulse 83, temperature 97.8 F (36.6 C), temperature source Oral, resp. rate (!) 24, height 5\' 8"  (1.727 m), weight 136 lb  14.5 oz (62.1 kg), SpO2 90 %.  Gen: no apparent distress Heart: RRR Lungs: CTA bilaterally Ext: no edema Incisions: C/D/I   Discharge Medications:  The patient has been discharged on:   1.Beta Blocker:  Yes [ X  ]                              No   [   ]                              If No, reason:  2.Ace Inhibitor/ARB: Yes [   ]                                     No  [  X  ]                                     If No, reason: CKD  3.Statin:   Yes [ X  ]                  No  [   ]                  If No, reason:  4.Ecasa:  Yes  [ X  ]                  No   [   ]                  If No, reason:       Allergies as of 12/12/2020   No Known Allergies      Medication List     STOP taking these medications    metoprolol succinate 25 MG 24 hr tablet Commonly known as: TOPROL-XL   potassium chloride 10 MEQ tablet Commonly known as: KLOR-CON   POTASSIUM PO       TAKE these medications    acetaminophen 500 MG tablet Commonly known as: TYLENOL Take 1 tablet (500 mg total) by mouth every 6 (six) hours as needed for headache. What changed:  medication strength how much to take when to take this reasons to take this   amLODipine 10 MG tablet Commonly known as: NORVASC Take 1 tablet (10 mg total) by mouth every morning. What changed: when to take this   aspirin 325 MG EC tablet Take 1 tablet (325 mg total) by mouth daily.   atorvastatin 10 MG tablet Commonly known as: LIPITOR Take 1 tablet (10 mg total) by mouth daily.   calcium carbonate 1500 (600 Ca) MG Tabs tablet Commonly known as: OSCAL Take 600 mg of elemental calcium by mouth daily with breakfast.   cloNIDine 0.2 MG tablet Commonly known as: CATAPRES Take 1 tablet (0.2 mg total) by mouth 2 (two) times daily.   DRY MOUTH SPRAY MT Use as directed 1 spray in the mouth or throat 6 (six) times daily. Biotene   freestyle lancets Check BS twice daily before meals, 250.00   gabapentin 300 MG capsule Commonly known as: NEURONTIN Take 300 mg by mouth 2 (two) times daily.   Glucerna Liqd Take 237 mLs by mouth 2 (two) times daily between meals.   glucose blood test strip Check  BS twice daily, 250.00   glucose monitoring kit monitoring kit 1 each by Does not apply route as needed for other. Please check BS twice daily before meals, 250.00   melatonin 3 MG Tabs tablet Take 3 mg by mouth at bedtime as needed (insomnia).   metoprolol tartrate 50 MG tablet Commonly known as: LOPRESSOR Take 1 tablet (50 mg total) by mouth 2 (two) times daily.   Na Sulfate-K Sulfate-Mg Sulf 17.5-3.13-1.6 GM/177ML Soln Suprep  (no substitutions)-TAKE AS DIRECTED.   oxyCODONE 5 MG immediate release tablet Commonly known as: Oxy IR/ROXICODONE Take 1 tablet (5 mg total) by mouth every 4 (four) hours as needed for severe pain.   Vitamin D-3 125 MCG (5000 UT) Tabs Take 5,000 Units by mouth daily.   Voltaren 1 % Gel Generic drug: diclofenac Sodium Apply 2-4 g topically 4 (four) times daily as needed (for bilateral foot pain).        Follow-up Information     Corliss Skains, MD Follow up on 12/19/2020.   Specialty: Cardiothoracic Surgery Why: Appointment is at 11:15 Contact information: [address] [place] [address] [phone]         Christell Constant, MD Follow up on 12/25/2020.   Specialty: Cardiology Why: Appointment is at 9:00 Contact information: [address] [place] [address] [phone]                 Lowella Dandy, PA-C  12/15/2020, 3:52 PM

## 2020-12-10 ENCOUNTER — Inpatient Hospital Stay (HOSPITAL_COMMUNITY): Payer: Medicare (Managed Care)

## 2020-12-10 ENCOUNTER — Encounter (HOSPITAL_COMMUNITY): Payer: Self-pay | Admitting: Thoracic Surgery (Cardiothoracic Vascular Surgery)

## 2020-12-10 LAB — GLUCOSE, CAPILLARY
Glucose-Capillary: 102 mg/dL — ABNORMAL HIGH (ref 70–99)
Glucose-Capillary: 150 mg/dL — ABNORMAL HIGH (ref 70–99)
Glucose-Capillary: 135 mg/dL — ABNORMAL HIGH (ref 70–99)
Glucose-Capillary: 110 mg/dL — ABNORMAL HIGH (ref 70–99)

## 2020-12-10 LAB — BASIC METABOLIC PANEL
Anion gap: 9 (ref 5–15)
BUN: 36 mg/dL — ABNORMAL HIGH (ref 8–23)
CO2: 24 mmol/L (ref 22–32)
Calcium: 8.5 mg/dL — ABNORMAL LOW (ref 8.9–10.3)
Chloride: 108 mmol/L (ref 98–111)
Creatinine, Ser: 2.89 mg/dL — ABNORMAL HIGH (ref 0.44–1.00)
GFR, Estimated: 17 mL/min — ABNORMAL LOW (ref >60)
Glucose, Bld: 103 mg/dL — ABNORMAL HIGH (ref 70–99)
Potassium: 4.4 mmol/L (ref 3.5–5.1)
Sodium: 141 mmol/L (ref 135–145)

## 2020-12-10 MED ORDER — METOPROLOL TARTRATE 50 MG PO TABS: 50.0000 mg | ORAL_TABLET | Freq: Two times a day (BID) | ORAL | Status: DC

## 2020-12-10 MED ORDER — ACETAMINOPHEN 500 MG PO TABS: 500.0000 mg | ORAL_TABLET | Freq: Four times a day (QID) | ORAL | 0 refills | Status: DC | PRN

## 2020-12-10 MED ORDER — CLONIDINE HCL 0.2 MG PO TABS: 0.2000 mg | ORAL_TABLET | Freq: Two times a day (BID) | ORAL | 11 refills | Status: DC

## 2020-12-10 MED ORDER — ASPIRIN 325 MG PO TBEC: 325.0000 mg | DELAYED_RELEASE_TABLET | Freq: Every day | ORAL | 0 refills | Status: DC

## 2020-12-10 NOTE — TOC Initial Note (Addendum)
Transition of Care [facility]) - Initial/Assessment Note    Patient Details  Name: Mary Stephens MRN: [id number] Date of Birth: 1956/06/25  [facility]) CM/SW Contact:    Bethann Berkshire, [place] Phone Number: 12/10/2020, 4:09 PM  Clinical Narrative:                  CSW discussed SNF recommendation with pt. She is agreeable to SNF workup and expresses desire to get stronger.Pt previously independent and lived alone. Pt requests that CSW notify her daughter. CSW explains SNF referral process and insurance authorization process.   CSW calls pt's daughter; no answer left voicemail requesting return call.   CSW called Marcie Bal with [facility]; no answer, left voicemail requesting return call.   1639: Spoke with Marcie Bal from [facility]. Marcie Bal states that [facility] will review PT notes from Epic and evaluate if pt meets criteria for SNF or the therapy that [facility] can provide.   1704: Marcie Bal from [facility] calls CSW and explains SNF is approved. [facility] is in contract with just 3 snfs: [facility], [place], [place]. CSW will contact each facility.   Expected Discharge Plan: Skilled Nursing Facility Barriers to Discharge: SNF Pending bed offer   Patient Goals and CMS Choice Patient states their goals for this hospitalization and ongoing recovery are:: To get stronger      Expected Discharge Plan and Services Expected Discharge Plan: [place]       Living arrangements for the past 2 months: Single Family Home                                      Prior Living Arrangements/Services Living arrangements for the past 2 months: Single Family Home Lives with:: Self Patient language and need for interpreter reviewed:: Yes        Need for Family Participation in Patient Care: Yes (Comment) Care giver support system in place?: No (comment)   Criminal Activity/Legal Involvement Pertinent to Current Situation/Hospitalization: No - Comment as needed  Activities of Daily  Living Home Assistive Devices/Equipment: None ADL Screening (condition at time of admission) Patient's cognitive ability adequate to safely complete daily activities?: No Is the patient deaf or have difficulty hearing?: No Does the patient have difficulty seeing, even when wearing glasses/contacts?: No Does the patient have difficulty concentrating, remembering, or making decisions?: No Patient able to express need for assistance with ADLs?: No Does the patient have difficulty dressing or bathing?: No Independently performs ADLs?: Yes (appropriate for developmental age) Does the patient have difficulty walking or climbing stairs?: No Weakness of Legs: None Weakness of Arms/Hands: None  Permission Sought/Granted   Permission granted to share information with : Yes, Verbal Permission Granted  Share Information with NAME: Mary Stephens, Mary Stephens (Daughter)   [phone] (Home Phone)           Emotional Assessment Appearance:: Appears stated age Attitude/Demeanor/Rapport: Engaged Affect (typically observed): Accepting Orientation: : Oriented to Self,Oriented to Place,Oriented to  Time,Oriented to Situation Alcohol / Substance Use: Not Applicable Psych Involvement: No (comment)  Admission diagnosis:  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 S/P ascending aortic replacement 12/02/2020  . Essential hypertension 05/14/2015  . Tobacco dependence 12/27/2014  . DM (diabetes mellitus), type 2 ([place]) 09/12/2014  . Chronic hepatitis C virus infection ([place]) 08/14/2014   PCP:  [name] Hedges, NP (Inactive) Pharmacy:  No Pharmacies Listed    Social Determinants of Health (SDOH) Interventions    Readmission Risk Interventions No flowsheet data found.

## 2020-12-10 NOTE — Progress Notes (Addendum)
      [address]       [place] [address]             [phone]      8 Days Post-Op Procedure(s) (LRB): REPAIR OF ACUTE ASCENDING THORACIC AORTIC DISSECTION VIA CIRC ARREST USING HEMASHIELD PLATINUM 28 MM VASCULAR GRAFT. (N/A) TRANSESOPHAGEAL ECHOCARDIOGRAM (TEE)   Subjective:  No new complaints. Worked with PT yesterday.  Nursing states that she gets occasional dizziness with ambulation  Objective: Vital signs in last 24 hours: Temp:  [97.5 F (36.4 C)-98.7 F (37.1 C)] 97.5 F (36.4 C) (12/29 0708) Pulse Rate:  [68-89] 81 (12/29 0708) Cardiac Rhythm: Normal sinus rhythm (12/29 0700) Resp:  [10-20] 19 (12/29 0708) BP: (131-159)/(82-103) 151/83 (12/29 0708) SpO2:  [90 %-94 %] 90 % (12/29 0708) Weight:  [61.7 kg] 61.7 kg (12/29 0700)  Intake/Output from previous day: 12/28 0701 - 12/29 0700 In: 5 [I.V.:5] Out: -   General appearance: alert, cooperative and no distress Heart: regular rate and rhythm Lungs: clear to auscultation bilaterally Abdomen: soft, non-tender; bowel sounds normal; no masses,  no organomegaly Extremities: extremities normal, atraumatic, no cyanosis or edema Wound: clean and dry  Lab Results: Recent Labs    12/09/20 0027  WBC 9.1  HGB 10.8*  HCT 34.4*  PLT 181   BMET:  Recent Labs    12/09/20 0027 12/10/20 0124  NA 141 141  K 4.3 4.4  CL 109 108  CO2 23 24  GLUCOSE 119* 103*  BUN 40* 36*  CREATININE 2.84* 2.89*  CALCIUM 8.4* 8.5*    PT/INR: No results for input(s): LABPROT, INR in the last 72 hours. ABG    Component Value Date/Time   PHART 7.400 12/04/2020 0214   HCO3 20.3 12/04/2020 0214   TCO2 21 (L) 12/04/2020 0214   ACIDBASEDEF 4.0 (H) 12/04/2020 0214   O2SAT 74.3 12/07/2020 0328   CBG (last 3)  Recent Labs    12/09/20 1554 12/09/20 2117 12/10/20 0628  GLUCAP 103* 117* 102*    Assessment/Plan: S/P Procedure(s) (LRB): REPAIR OF ACUTE ASCENDING THORACIC AORTIC DISSECTION VIA CIRC ARREST  USING HEMASHIELD PLATINUM 28 MM VASCULAR GRAFT. (N/A) TRANSESOPHAGEAL ECHOCARDIOGRAM (TEE)  1. CV- NSR, + HTN- continue Norvasc, Clonidine, Lopressor 2. Pulm- weaning oxygen as tolerated, S/p thoracentesis yesterday with removal of 650 cc... CXR no pneumothorax, small residual left pleural effusion 3. Renal- creatinine stable at 2.89, Nephrology following 4. Deconditioning- PT/OT, needs SNF at discharge, CSW arranging 5. dispo- patient stable, creatinine stable, weaning oxygen, remains in NSR... For SNF once bed is available and renal feels creatinine levels are stable  LOS: 8 days    Mary Handler, PA-C  12/10/2020  Agree with above Overall doing well Creat stable 650 off with thoracentesis yesterday Continue pulm toilet dispo planning  Mary Stephens

## 2020-12-10 NOTE — Evaluation (Signed)
Occupational Therapy Evaluation Patient Details Name: Mary Stephens MRN: [id number] DOB: 1956-07-14 Today's Date: 12/10/2020    History of Present Illness 64 y.o. female presents to the emergency department 12/21 with acute onset chest and back pain as well as R LE paresthesia resulting in fall. Pt had similar symptoms a week prior and left ED AMA. 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 Right lower extremity shows signs of ischemia. s/p type A dissection repair 12/21. s/p 12/24 R thoracentesis.   Clinical Impression   Patient admitted for the above diagnosis and procedure.  Deficits listed below.  PTA the patient lived alone and cared for herself without any assistance.  She odes not drive.  Currently she is needing a lot of cueing for sternal precautions, up to min A for basic mobility and ADL's.  She was very active prior to admission, and believes SNF post acute, will give her the best chance to return home at her prior level.  OT will continue to see in the acute setting.      Follow Up Recommendations  SNF    Equipment Recommendations  Tub/shower seat    Recommendations for Other Services       Precautions / Restrictions Precautions Precautions: Fall;Sternal Precaution Comments: continued sternal education this date. Restrictions Weight Bearing Restrictions: Yes RUE Weight Bearing: Touch down weight bearing LUE Weight Bearing: Touch down weight bearing Other Position/Activity Restrictions: educated regarding light push off for standing, needs to use her legs.  worked on positioning at [employer], feet underneath her and bed elevated.      Mobility Bed Mobility Overal bed mobility: Needs Assistance Bed Mobility: Rolling;Supine to Sit;Sit to Supine Rolling: Min guard   Supine to sit: Min assist Sit to supine: Mod assist        Transfers Overall transfer level: Needs assistance   Transfers: Sit to/from Stand Sit  to Stand: Min assist;From elevated surface              Balance   Sitting-balance support: Feet supported;No upper extremity supported Sitting balance-Leahy Scale: Fair                                     ADL either performed or assessed with clinical judgement   ADL Overall ADL's : Needs assistance/impaired Eating/Feeding: Independent;Sitting               Upper Body Dressing : Minimal assistance;Cueing for UE precautions;Sitting   Lower Body Dressing: Minimal assistance;Sitting/lateral leans;Cueing for safety                                           Pertinent Vitals/Pain Pain Assessment: Faces Faces Pain Scale: Hurts a little bit Pain Location: sternal incision and L flank. Pain Descriptors / Indicators: Aching Pain Intervention(s): Monitored during session     Hand Dominance Right   Extremity/Trunk Assessment Upper Extremity Assessment Upper Extremity Assessment: Overall WFL for tasks assessed   Lower Extremity Assessment Lower Extremity Assessment: Defer to PT evaluation   Cervical / Trunk Assessment Cervical / Trunk Assessment: Normal   Communication Communication Communication: No difficulties   Cognition Arousal/Alertness: Awake/alert Behavior During Therapy: WFL for tasks assessed/performed;Flat affect Overall Cognitive Status: Within Functional Limits for tasks assessed  General Comments   VSS on RA    Exercises     Shoulder Instructions      Home Living Family/patient expects to be discharged to:: Private residence Living Arrangements: Alone Available Help at Discharge: Family;Available 24 hours/day Type of Home: Apartment Home Access: Stairs to enter [employer] of Steps: 4 Entrance Stairs-Rails: Can reach both Home Layout: One level     Bathroom Shower/Tub: Teacher, early years/pre: Standard     Home Equipment: None           Prior Functioning/Environment Level of Independence: Independent        Comments: [facility] assists with transportation per PT note        OT Problem List: Impaired balance (sitting and/or standing);Decreased knowledge of precautions;Decreased knowledge of use of DME or AE;Pain      OT Treatment/Interventions: Self-care/ADL training;Therapeutic exercise;Energy conservation;DME and/or AE instruction;Balance training;Therapeutic activities    OT Goals(Current goals can be found in the care plan section) Acute Rehab OT Goals Patient Stated Goal: Patient states she wants to get stronger and move more OT Goal Formulation: With patient Time For Goal Achievement: 12/24/20 Potential to Achieve Goals: Good ADL Goals Pt Will Perform Grooming: with supervision;sitting;standing Pt Will Perform Upper Body Bathing: with supervision;sitting;standing Pt Will Perform Lower Body Bathing: with supervision;sit to/from stand Pt Will Perform Upper Body Dressing: with supervision;sitting Pt Will Perform Lower Body Dressing: with supervision;sit to/from stand Pt Will Transfer to Toilet: with supervision;ambulating;regular height toilet Pt Will Perform Toileting - Clothing Manipulation and hygiene: Independently;sit to/from stand  OT Frequency: Min 2X/week   Barriers to D/C:    none noted       Co-evaluation              AM-PAC OT "6 Clicks" Daily Activity     Outcome Measure Help from another person eating meals?: None Help from another person taking care of personal grooming?: A Little Help from another person toileting, which includes using toliet, bedpan, or urinal?: A Little Help from another person bathing (including washing, rinsing, drying)?: A Little Help from another person to put on and taking off regular upper body clothing?: A Little Help from another person to put on and taking off regular lower body clothing?: A Little 6 Click Score: 19   End of Session    Activity  Tolerance: Patient tolerated treatment well Patient left: in bed;with call bell/phone within reach  OT Visit Diagnosis: Unsteadiness on feet (R26.81);Pain Pain - Right/Left:  (sternal)                Time: 9381-0175 OT Time Calculation (min): 18 min Charges:  OT General Charges $OT Visit: 1 Visit OT Evaluation $OT Eval Moderate Complexity: 1 Mod  12/10/2020  Rich, OTR/L  Acute Rehabilitation Services  Office:  [place] 12/10/2020, 3:38 PM

## 2020-12-10 NOTE — NC FL2 (Signed)
[place] MEDICAID FL2 LEVEL OF CARE SCREENING TOOL     IDENTIFICATION  Patient Name: Mary Stephens Birthdate: 1956-03-14 Sex: female Admission Date (Current Location): 12/01/2020  [facility] and [place] Number:      Facility and Address:  [facility], [place] [address] [place], [place] [address]      Provider Number: [id number]  Attending Physician Name and Address:  Lajuana Matte, MD  Relative Name and Phone Number:  Toyoko, Silos (Daughter)   [phone] [facility] Phone)    Current Level of Care: Hospital Recommended Level of Care: [place] Prior Approval Number:    Date Approved/Denied:   PASRR Number: [id number]  Discharge Plan: SNF    Current Diagnoses: Patient Active Problem List   Diagnosis Date Noted  . S/P ascending aortic replacement 12/02/2020  . Essential hypertension 05/14/2015  . Tobacco dependence 12/27/2014  . DM (diabetes mellitus), type 2 ([place]) 09/12/2014  . Chronic hepatitis C virus infection ([place]) 08/14/2014    Orientation RESPIRATION BLADDER Height & Weight     Self,Time,Situation,Place  O2 ([place] 2L) Continent Weight: 136 lb 0.4 oz (61.7 kg) Height:  5\' 8"  (172.7 cm)  BEHAVIORAL SYMPTOMS/MOOD NEUROLOGICAL BOWEL NUTRITION STATUS      Continent Diet (see d/c summary)  AMBULATORY STATUS COMMUNICATION OF NEEDS Skin   Extensive Assist Verbally Surgical wounds (Incision Axilla Right; Incision Chest)                       Personal Care Assistance Level of Assistance  Bathing,Feeding,Dressing Bathing Assistance: Limited assistance Feeding assistance: Independent Dressing Assistance: Limited assistance     Functional Limitations Info  Sight,Hearing,Speech Sight Info: Adequate   Speech Info: Adequate    SPECIAL CARE FACTORS FREQUENCY  PT (By licensed PT),OT (By licensed OT)     PT Frequency: 5x/week OT Frequency: 5x/week            Contractures Contractures Info: Not present     Additional Factors Info  Code Status,Allergies Code Status Info: Full Code Allergies Info: No known allergies           Current Medications (12/10/2020):  This is the current hospital active medication list Current Facility-Administered Medications  Medication Dose Route Frequency Provider Last Rate Last Admin  . 0.45 % sodium chloride infusion   Intravenous Continuous PRN Barrett, Erin R, PA-C      . 0.9 %  sodium chloride infusion (Manually program via Guardrails IV Fluids)   Intravenous Once Lajuana Matte, MD   Held at 12/02/20 1619  . 0.9 %  sodium chloride infusion  250 mL Intravenous Continuous Barrett, Lodema Hong, PA-C   Held at 12/03/20 [phone]  . 0.9 %  sodium chloride infusion   Intravenous Continuous Barrett, Erin R, PA-C 20 mL/hr at 12/02/20 1618 New Bag/Given (Non-Interop) at 12/02/20 1618  . 0.9 %  sodium chloride infusion  250 mL Intravenous PRN Carlene Coria, NP      . acetaminophen (TYLENOL) tablet 500 mg  500 mg Oral Q6H PRN Carlene Coria, NP      . amLODipine (NORVASC) tablet 10 mg  10 mg Oral Daily Lajuana Matte, MD   10 mg at 12/10/20 0945  . aspirin EC tablet 325 mg  325 mg Oral Daily Barrett, Erin R, PA-C   325 mg at 12/10/20 1443   Or  . aspirin chewable tablet 324 mg  324 mg Per Tube Daily Barrett, Erin R, PA-C  324 mg at 12/07/20 0919  . atorvastatin (LIPITOR) tablet 10 mg  10 mg Oral Daily Barrett, Erin R, PA-C   10 mg at 12/10/20 0946  . bisacodyl (DULCOLAX) EC tablet 10 mg  10 mg Oral Daily Barrett, Erin R, PA-C   10 mg at 12/08/20 9892   Or  . bisacodyl (DULCOLAX) suppository 10 mg  10 mg Rectal Daily Barrett, Erin R, PA-C      . chlorhexidine (PERIDEX) 0.12 % solution 15 mL  15 mL Mouth Rinse BID Lajuana Matte, MD   15 mL at 12/09/20 2141  . Chlorhexidine Gluconate Cloth 2 % PADS 6 each  6 each Topical Daily Lajuana Matte, MD   6 each at 12/10/20 0948  . cloNIDine (CATAPRES) tablet 0.2 mg  0.2 mg Oral BID Rosita Fire, MD   0.2 mg at 12/10/20 0945  . Many Cardiac Surgery, Patient & Family Education   Does not apply Once Carlene Coria, NP      . dextrose 50 % solution 0-50 mL  0-50 mL Intravenous PRN Barrett, Erin R, PA-C      . docusate sodium (COLACE) capsule 200 mg  200 mg Oral Daily Barrett, Erin R, PA-C   200 mg at 12/10/20 0946  . enoxaparin (LOVENOX) injection 30 mg  30 mg Subcutaneous Q24H Barrett, Erin R, PA-C   30 mg at 12/09/20 2141  . gabapentin (NEURONTIN) capsule 300 mg  300 mg Oral QHS Donato Heinz, MD   300 mg at 12/09/20 2140  . guaiFENesin (ROBITUSSIN) 100 MG/5ML solution 100 mg  5 mL Oral Q4H PRN Carlene Coria, NP      . hydrALAZINE (APRESOLINE) injection 10 mg  10 mg Intravenous Q6H PRN Lajuana Matte, MD   10 mg at 12/09/20 0554  . lactated ringers infusion   Intravenous Continuous Barrett, Lodema Hong, PA-C   Held at 12/02/20 1515  . lactated ringers infusion   Intravenous Continuous Barrett, Erin R, PA-C   Stopped at 12/07/20 1019  . levalbuterol (XOPENEX) nebulizer solution 0.63 mg  0.63 mg Nebulization Q6H PRN Lightfoot, Harrell O, MD      . magnesium sulfate IVPB 4 g 100 mL  4 g Intravenous Once Barrett, Lodema Hong, PA-C   Held at 12/02/20 1515  . MEDLINE mouth rinse  15 mL Mouth Rinse q12n4p Lightfoot, Harrell O, MD   15 mL at 12/06/20 1600  . metoprolol tartrate (LOPRESSOR) injection 2.5-5 mg  2.5-5 mg Intravenous Q2H PRN Barrett, Erin R, PA-C   5 mg at 12/06/20 1746  . metoprolol tartrate (LOPRESSOR) tablet 50 mg  50 mg Oral BID Lajuana Matte, MD   50 mg at 12/10/20 0945  . morphine 2 MG/ML injection 1-4 mg  1-4 mg Intravenous Q1H PRN Barrett, Erin R, PA-C   2 mg at 12/05/20 0444  . oxyCODONE (Oxy IR/ROXICODONE) immediate release tablet 5-10 mg  5-10 mg Oral Q3H PRN Barrett, Erin R, PA-C   10 mg at 12/09/20 0555  . pantoprazole (PROTONIX) EC tablet 40 mg  40 mg Oral Daily Barrett, Erin R, PA-C   40 mg at 12/10/20 0946  . sodium bicarbonate tablet 650 mg   650 mg Oral BID Corliss Parish, MD   650 mg at 12/10/20 0946  . sodium chloride flush (NS) 0.9 % injection 10-40 mL  10-40 mL Intracatheter Q12H Lightfoot, Harrell O, MD   10 mL at 12/09/20 1000  . sodium chloride flush (NS) 0.9 %  injection 10-40 mL  10-40 mL Intracatheter PRN Lightfoot, Harrell O, MD      . sodium chloride flush (NS) 0.9 % injection 3 mL  3 mL Intravenous Q12H Carlene Coria, NP   3 mL at 12/10/20 0949  . sodium chloride flush (NS) 0.9 % injection 3 mL  3 mL Intravenous PRN Carlene Coria, NP   3 mL at 12/08/20 0948  . traMADol (ULTRAM) tablet 50-100 mg  50-100 mg Oral Q4H PRN Barrett, Erin R, PA-C   50 mg at 12/08/20 9147     Discharge Medications: Please see discharge summary for a list of discharge medications.  Relevant Imaging Results:  Relevant Lab Results:   Additional Information SSN [place], [place]

## 2020-12-10 NOTE — Plan of Care (Signed)

## 2020-12-11 LAB — GLUCOSE, CAPILLARY
Glucose-Capillary: 106 mg/dL — ABNORMAL HIGH (ref 70–99)
Glucose-Capillary: 95 mg/dL (ref 70–99)
Glucose-Capillary: 109 mg/dL — ABNORMAL HIGH (ref 70–99)
Glucose-Capillary: 131 mg/dL — ABNORMAL HIGH (ref 70–99)

## 2020-12-11 MED ORDER — FUROSEMIDE 40 MG PO TABS
40.0000 mg | ORAL_TABLET | Freq: Every day | ORAL | Status: DC
  Administered 2020-12-12: 40 mg via ORAL
  Filled 2020-12-11 (×2): qty 1

## 2020-12-11 NOTE — Progress Notes (Signed)
Physical Therapy Treatment Patient Details Name: Mary Stephens MRN: [id number] DOB: 1956-11-22 Today's Date: 12/11/2020    History of Present Illness 64 y.o. female presents to the emergency department 12/21 with acute onset chest and back pain as well as R LE paresthesia resulting in fall. Pt had similar symptoms a week prior and left ED AMA. 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 Right lower extremity shows signs of ischemia. s/p type A dissection repair 12/21. s/p 12/24 R thoracentesis.    PT Comments    Pt required cues for sequencing and safety this session.  Pt attempting to use UE outside of "the tube"  Educated on safe sternal precautions this session.  Pt continues to improve and remains appropriate for SNF placement.     Follow Up Recommendations  SNF     Equipment Recommendations  Rolling walker with 5" wheels    Recommendations for Other Services       Precautions / Restrictions Precautions Precautions: Fall;Sternal Precaution Booklet Issued: No Precaution Comments: continued sternal education this date. Restrictions Weight Bearing Restrictions: No (sternal precautions.) Other Position/Activity Restrictions: educated pushing from knees vs. outside BOS, needs to use her legs.  worked on positioning at [employer], feet underneath her and bed elevated.    Mobility  Bed Mobility Overal bed mobility: Needs Assistance Bed Mobility: Rolling;Sidelying to Sit;Sit to Sidelying Rolling: Min guard Sidelying to sit: Min assist     Sit to sidelying: Mod assist General bed mobility comments: Increased assistance to return back to bed.  Pt required cues to keep UE close to body to maintain sternal precautions.  Transfers Overall transfer level: Needs assistance Equipment used: Rolling walker (2 wheeled) Transfers: Sit to/from Stand Sit to Stand: Min assist;From elevated surface         General transfer comment:  Cues for hand placement on knees to push into standing.  Pt asking for RW to pull on and PTA declined request to focus on problem solving mobility without pulling.  Ambulation/Gait Ambulation/Gait assistance: Supervision Gait Distance (Feet): 220 Feet Assistive device: Rolling walker (2 wheeled) Gait Pattern/deviations: Decreased step length - left;Step-through pattern;Trunk flexed     General Gait Details: Cues for upper trunk control and pacing.  HR stable, poor waveform noted with gt training.   Stairs             Wheelchair Mobility    Modified Rankin (Stroke Patients Only)       Balance Overall balance assessment: Needs assistance Sitting-balance support: Feet supported;No upper extremity supported Sitting balance-Leahy Scale: Fair       Standing balance-Leahy Scale: Poor                              Cognition Arousal/Alertness: Awake/alert Behavior During Therapy: WFL for tasks assessed/performed;Agitated (brief moments of agitation but able to redirect) Overall Cognitive Status: Within Functional Limits for tasks assessed                                 General Comments: pt frustrated with not being as independent as she was before she came to the hospital      Exercises Other Exercises Other Exercises: IS x 10 reps, poor inspiration quality @ 250-341ml.    General Comments        Pertinent Vitals/Pain Pain Assessment: Faces  Faces Pain Scale: Hurts even more Pain Location: sternal incision and L flank. Pain Descriptors / Indicators: Aching Pain Intervention(s): Monitored during session;Repositioned    Home Living                      Prior Function            PT Goals (current goals can now be found in the care plan section) Acute Rehab PT Goals Patient Stated Goal: Patient states she wants to get stronger and move more Potential to Achieve Goals: Good Progress towards PT goals: Progressing toward  goals    Frequency    Min 2X/week      PT Plan Current plan remains appropriate    Co-evaluation              AM-PAC PT "6 Clicks" Mobility   Outcome Measure  Help needed turning from your back to your side while in a flat bed without using bedrails?: A Little Help needed moving from lying on your back to sitting on the side of a flat bed without using bedrails?: A Lot Help needed moving to and from a bed to a chair (including a wheelchair)?: A Little Help needed standing up from a chair using your arms (e.g., wheelchair or bedside chair)?: A Little Help needed to walk in hospital room?: A Little Help needed climbing 3-5 steps with a railing? : A Little 6 Click Score: 17    End of Session Equipment Utilized During Treatment: Oxygen Activity Tolerance: Patient limited by fatigue Patient left: in bed;with call bell/phone within reach;with bed alarm set Nurse Communication: Mobility status PT Visit Diagnosis: Unsteadiness on feet (R26.81);Other abnormalities of gait and mobility (R26.89);Muscle weakness (generalized) (M62.81)     Time: 1610-9604 PT Time Calculation (min) (ACUTE ONLY): 26 min  Charges:  $Gait Training: 8-22 mins $Therapeutic Activity: 8-22 mins                     Bonney Leitz , PTA Acute Rehabilitation Services Pager [phone] Office [phone]     Mary Stephens 12/11/2020, 2:48 PM

## 2020-12-11 NOTE — TOC Progression Note (Addendum)
Transition of Care [facility]) - Progression Note    Patient Details  Name: ELIRA COLASANTI MRN: [id number] Date of Birth: 1956-04-08  Transition of Care [facility]) CM/SW [place], [place] Phone Number: 12/11/2020, 8:34 AM  Clinical Narrative:     CSW spoke with pt daughter Theadora Rama. Theadora Rama is wanting pt to return home with her. Pt will need HH. Theadora Rama is also requesting Hospital bed, 3 in 1, walker, bedside table, and transportation home from [facility]. Daughter states she wants d/c to happen tomorrow.  CSW called and left message with Marcie Bal of [facility] to update and to arrange [facility]   1130: Marcie Bal of pt confirmed that Claudia Desanctis will arrange [facility] and DME. Informed CSW that daughter is wanting PTAR transport home.   1223: Daughter left voicemail to CSW confirming plan for PTAR tomorrow. Pt to be transported to daughters address:   [address] [place] [address]  Expected Discharge Plan: [place] Barriers to Discharge: SNF Pending bed offer  Expected Discharge Plan and Services Expected Discharge Plan: [place] arrangements for the past 2 months: Single Family Home                                       Social Determinants of Health (SDOH) Interventions    Readmission Risk Interventions No flowsheet data found.

## 2020-12-11 NOTE — Plan of Care (Signed)
?  Problem: Clinical Measurements: ?Goal: Ability to maintain clinical measurements within normal limits will improve ?Outcome: Progressing ?Goal: Will remain free from infection ?Outcome: Progressing ?Goal: Diagnostic test results will improve ?Outcome: Progressing ?  ?

## 2020-12-11 NOTE — Progress Notes (Addendum)
      [place]Suite 411       [place],[address] [address]             [phone]      9 Days Post-Op Procedure(s) (LRB): REPAIR OF ACUTE ASCENDING THORACIC AORTIC DISSECTION VIA CIRC ARREST USING HEMASHIELD PLATINUM 28 MM VASCULAR GRAFT. (N/A) TRANSESOPHAGEAL ECHOCARDIOGRAM (TEE) Subjective: Feels good this morning and wants to go home with her daughter.   Objective: Vital signs in last 24 hours: Temp:  [98.1 F (36.7 C)-98.4 F (36.9 C)] 98.3 F (36.8 C) (12/30 0000) Pulse Rate:  [70-84] 70 (12/30 0000) Cardiac Rhythm: Normal sinus rhythm (12/30 0701) Resp:  [14-20] 14 (12/30 0000) BP: (124-135)/(79-85) 135/79 (12/30 0000) SpO2:  [90 %-92 %] 92 % (12/30 0000) Weight:  [62.2 kg] 62.2 kg (12/30 0617)    Intake/Output from previous day: 12/29 0701 - 12/30 0700 In: 783 [P.O.:780; I.V.:3] Out: -  Intake/Output this shift: No intake/output data recorded.  General appearance: alert, cooperative and no distress Heart: regular rate and rhythm, S1, S2 normal, no murmur, click, rub or gallop Lungs: clear to auscultation bilaterally Abdomen: soft, non-tender; bowel sounds normal; no masses,  no organomegaly Extremities: extremities normal, atraumatic, no cyanosis or edema Wound: clean and dry  Lab Results: Recent Labs    12/09/20 0027  WBC 9.1  HGB 10.8*  HCT 34.4*  PLT 181   BMET:  Recent Labs    12/09/20 0027 12/10/20 0124  NA 141 141  K 4.3 4.4  CL 109 108  CO2 23 24  GLUCOSE 119* 103*  BUN 40* 36*  CREATININE 2.84* 2.89*  CALCIUM 8.4* 8.5*    PT/INR: No results for input(s): LABPROT, INR in the last 72 hours. ABG    Component Value Date/Time   PHART 7.400 12/04/2020 0214   HCO3 20.3 12/04/2020 0214   TCO2 21 (L) 12/04/2020 0214   ACIDBASEDEF 4.0 (H) 12/04/2020 0214   O2SAT 74.3 12/07/2020 0328   CBG (last 3)  Recent Labs    12/10/20 1555 12/10/20 2103 12/11/20 0604  GLUCAP 135* 110* 106*    Assessment/Plan: S/P Procedure(s)  (LRB): REPAIR OF ACUTE ASCENDING THORACIC AORTIC DISSECTION VIA CIRC ARREST USING HEMASHIELD PLATINUM 28 MM VASCULAR GRAFT. (N/A) TRANSESOPHAGEAL ECHOCARDIOGRAM (TEE)   1. CV- NSR, + HTN- continue Norvasc, Clonidine, Lopressor 2. Pulm- weaning oxygen as tolerated, S/p thoracentesis 12/28 with removal of 650 cc... CXR no pneumothorax, small residual left pleural effusion. Breathing is fine this morning, saturations 92%.  3. 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 Nephrology following 4. Deconditioning- continue PT/OT  Plan: NCFL2 in the chart, signed. SNF vs. Home with home health? She states she can go home with her daughter.      LOS: 9 days    Elgie Collard 12/11/2020  Agree with above Will continue to follow creat.  Adding lasix Continue pulm toilet dispo planning.  Alishba Naples Bary Leriche

## 2020-12-11 NOTE — Discharge Instructions (Signed)

## 2020-12-11 NOTE — Care Management Important Message (Signed)
Important Message  Patient Details  Name: Mary Stephens MRN: [id number] Date of Birth: 1956/01/02   Medicare Important Message Given:  Yes     Orbie Pyo 12/11/2020, 3:52 PM

## 2020-12-12 LAB — BASIC METABOLIC PANEL
Anion gap: 9 (ref 5–15)
BUN: 35 mg/dL — ABNORMAL HIGH (ref 8–23)
CO2: 27 mmol/L (ref 22–32)
Calcium: 8.6 mg/dL — ABNORMAL LOW (ref 8.9–10.3)
Chloride: 106 mmol/L (ref 98–111)
Creatinine, Ser: 2.74 mg/dL — ABNORMAL HIGH (ref 0.44–1.00)
GFR, Estimated: 19 mL/min — ABNORMAL LOW (ref >60)
Glucose, Bld: 117 mg/dL — ABNORMAL HIGH (ref 70–99)
Potassium: 4.3 mmol/L (ref 3.5–5.1)
Sodium: 142 mmol/L (ref 135–145)

## 2020-12-12 LAB — GLUCOSE, CAPILLARY
Glucose-Capillary: 112 mg/dL — ABNORMAL HIGH (ref 70–99)
Glucose-Capillary: 95 mg/dL (ref 70–99)

## 2020-12-12 MED ORDER — OXYCODONE HCL 5 MG PO TABS
5.0000 mg | ORAL_TABLET | ORAL | 0 refills | Status: DC | PRN
Start: 2020-12-12 — End: 2022-03-25

## 2020-12-12 MED ORDER — CLONIDINE HCL 0.2 MG PO TABS: 0.2000 mg | ORAL_TABLET | Freq: Two times a day (BID) | ORAL | 3 refills | Status: DC

## 2020-12-12 NOTE — Progress Notes (Signed)
RN went over discharge information with pt. RN removed IV. Pt had belongings with her upon departure.

## 2020-12-12 NOTE — Progress Notes (Signed)
Discussed IS, sternal precautions, wound care with pt. Also called daughter and left voicemail. Pt practiced IS, 250-300 mL. N/a for CRPII. Encouraged smoking cessation and left materials, gave pt suggestions. Pt not ready to quit. Left OHS book for pt and daughter to review. 1020-1040 Yves Dill CES, ACSM 10:51 AM 12/12/2020

## 2020-12-12 NOTE — Progress Notes (Signed)
Spoke to pts daughter, Theadora Rama, by phone. Discussed sternal precautions, IS, mobility, and wound care. Receptive. [place], ACSM 12:36 PM 12/12/2020

## 2020-12-12 NOTE — TOC Transition Note (Addendum)
Transition of Care [facility]) - CM/SW Discharge Note   Patient Details  Name: Mary Stephens MRN: [id number] Date of Birth: 23-Mar-1956  Transition of Care [facility]) CM/SW Contact:  Mary Mayo, RN Phone Number: 12/12/2020, 11:47 AM   Clinical Narrative:    Patient is for dc today, NCM scheduled PTAR for 12 noon.  NCM notified daughter also, daughter, Theadora Rama states patient will be going to [address] [place] [address].  [facility] is a [facility] patient, they will set up [facility] services for patient and also order DME.   Final next level of care: [place] ([facility]) Barriers to Discharge: No Barriers Identified   Patient Goals and CMS Choice Patient states their goals for this hospitalization and ongoing recovery are:: To get stronger      Discharge Placement                       Discharge Plan and Services                                     Social Determinants of Health (SDOH) Interventions     Readmission Risk Interventions No flowsheet data found.

## 2020-12-12 NOTE — Discharge Summary (Signed)
Physician Discharge Summary   Patient ID: ASMI FUGERE [id number] 64 y.o. [birth year]  Admit date: 12/01/2020  Discharge date and time: No discharge date for patient encounter.   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 Yono is a 64 yo AA female with known history of HTN, Diabetes, and Hyperlipidemia.  She presented to the Emergency Department on 11/27/2020 with complaints of chest pain with radiation to the left side of her neck and arm.  The pain was worse with deep inspiration and she was short of breath.  EKG was obtained which showed flipped T waves and trace ST depression.  Her troponin level was 40 and remained unchanged.  D-Dimer was also obtained which was elevated.  She was unable to have a CT scan due to elevated renal function.  Ultimately the patient left the hospital AMA for a second opinion.  She again presented to the ED this morning on 12/02/2020.  She presented with complaints of epigastric/chest pain.  The pain developed suddenly after the patient yelled at her friend.  The patient also developed an episode of lower extremity numbness and weakness in her right leg resulting in a fall.  Due to this the patient presented for evaluation.  CTA of the chest was obtained and revealed a Type A Aortic Dissection.  Cardiothoracic surgery was consulted for emergent surgical intervention.  Hospital Course:  Ms. Donelan was taken emergently to the operating room.  She underwent Repair of Aortic Dissection and Hemi-arch replacement of the ascending aorta.  She  tolerated the procedure without difficulty and was taken to the SICU in stable condition.  The patient was monitored by Vascular surgery.  She was found to have easily palpable PT pulses bilaterally.  The patient was weaned and extubated on POD #1.  She was weaned off Milrinone as hemodynamics allowed.  The patient's cardene was titrated for additional BP control.  Her creatinine level was up to 3.41.  Nephrology was consulted and they are aware of patient as they evaluated her previously.  They did not feel there was any need for urgent dialysis.  They recommended avoiding nephrotoxic agents.  They also felt she may benefit from a renal artery duplex to evaluate flow to her left kidney.  The patient developed respiratory distress overnight.  She was treated without a non-rebreather.  Her ABG did not improved and she was placed on BIPAP.  The patient immediately removed it stating she couldn't do it.  The patient was placed on a high flow nasal cannula.  She was found to have a pleural effusion and it was recommended the patient undergo Thoracentesis.  Critical care consult was obtained for this.  She unfortunately refused to have the procedure performed.  She was treated Lasix for volume overload.  She remained short of breath.  CXR now showed development of a right sided pleural effusion.  We again felt Thoracentesis would be indicated.  This time the patient was agreeable to proceed.  This was done at bedside with successful removal of 600 cc of fluid.  Her oxygen requirement improved post procedure.  She remained hypertensive post operatively.  Her medications were  adjusted and she was started on Clonidine additional support.  Unfortunately her elevated BP persisted despite Norvasc, Lopressor, and Clonidine.  She required several doses of Hydralazine for additional BP support.   Her CXR continued to show a left sided pleural effusion.  Thoracentesis was again recommended.  She was maintaining NSR and felt  medically stable for transfer to the telemetry unit on 12/08/2020.    Consults: nephrology  Discharge Exam: Alert NAD Sinus EWOB Incision clean abd soft  Disposition:   Patient Instructions:  Allergies as of 12/12/2020   No Known Allergies     Medication List    STOP taking these medications   metoprolol succinate 25 MG 24 hr tablet Commonly known as: TOPROL-XL   potassium chloride 10 MEQ tablet Commonly known as: KLOR-CON   POTASSIUM PO     TAKE these medications   acetaminophen 500 MG tablet Commonly known as: TYLENOL Take 1 tablet (500 mg total) by mouth every 6 (six) hours as needed for headache. What changed:   medication strength  how much to take  when to take this  reasons to take this   amLODipine 10 MG tablet Commonly known as: NORVASC Take 1 tablet (10 mg total) by mouth every morning. What changed: when to take this   aspirin 325 MG EC tablet Take 1 tablet (325 mg total) by mouth daily.   atorvastatin 10 MG tablet Commonly known as: LIPITOR Take 1 tablet (10 mg total) by mouth daily.   calcium carbonate 1500 (600 Ca) MG Tabs tablet Commonly known as: OSCAL Take 600 mg of elemental calcium by mouth daily with breakfast.   cloNIDine 0.2 MG tablet Commonly known as: CATAPRES Take 1 tablet (0.2 mg total) by mouth 2 (two) times daily.   DRY MOUTH SPRAY MT Use as directed 1 spray in the mouth or throat 6 (six) times daily. Biotene   freestyle lancets Check BS twice daily before meals, 250.00   gabapentin 300 MG capsule Commonly known as: NEURONTIN Take 300 mg by mouth 2 (two) times daily.   Glucerna Liqd Take 237 mLs by mouth 2 (two) times daily between meals.   glucose blood test strip Check BS twice daily, 250.00   glucose monitoring kit monitoring kit 1 each by Does not apply route as needed for other. Please check BS twice daily before meals, 250.00   melatonin 3 MG Tabs tablet Take 3 mg by mouth at bedtime as needed  (insomnia).   metoprolol tartrate 50 MG tablet Commonly known as: LOPRESSOR Take 1 tablet (50 mg total) by mouth 2 (two) times daily.   Na Sulfate-K Sulfate-Mg Sulf 17.5-3.13-1.6 GM/177ML Soln Suprep (no substitutions)-TAKE AS DIRECTED.   oxyCODONE 5 MG immediate release tablet Commonly known as: Oxy IR/ROXICODONE Take 1 tablet (5 mg total) by mouth every 4 (four) hours as needed for severe pain.   Vitamin D-3 125 MCG (5000 UT) Tabs Take 5,000 Units by mouth daily.   Voltaren 1 % Gel Generic drug: diclofenac Sodium Apply 2-4 g topically 4 (four) times daily as needed (for bilateral foot pain).            Durable Medical Equipment  (From admission, onward)         Start     Ordered   12/11/20 1114  For home use only DME Walker rolling  Once       Question Answer Comment  Walker: With 5 Inch Wheels   Patient needs a walker to treat with the following  condition Physical deconditioning      12/11/20 1114   12/11/20 1114  For home use only DME Hospital bed  Once       Question Answer Comment  Length of Need 6 Months   Patient has (list medical condition): s/p aortic dissection   Bed type Semi-electric      12/11/20 1114   12/11/20 1113  For home use only DME 3 n 1  Once        12/11/20 1114         Activity: activity as tolerated, no lifting, driving, or strenuous exercise for 4 weeks, no driving for 3 weeks and no driving while on analgesics Diet: cardiac diet Wound Care: keep wound clean and dry  Follow-up with Dr. Kipp Brood in 1 week.  SignedLajuana Matte 12/12/2020 9:26 AM

## 2020-12-17 ENCOUNTER — Ambulatory Visit: Payer: Medicare (Managed Care) | Admitting: Cardiology

## 2020-12-17 ENCOUNTER — Encounter: Payer: Medicare (Managed Care) | Admitting: Internal Medicine

## 2020-12-18 ENCOUNTER — Other Ambulatory Visit: Payer: Self-pay | Admitting: Thoracic Surgery (Cardiothoracic Vascular Surgery)

## 2020-12-18 DIAGNOSIS — Z95828 Presence of other vascular implants and grafts: Secondary | ICD-10-CM

## 2020-12-19 ENCOUNTER — Encounter: Payer: Self-pay | Admitting: Thoracic Surgery (Cardiothoracic Vascular Surgery)

## 2020-12-19 ENCOUNTER — Ambulatory Visit
Admission: RE | Admit: 2020-12-19 | Discharge: 2020-12-19 | Disposition: A | Discharge status: Home / Self Care | Payer: Medicare (Managed Care) | Source: Ambulatory Visit | Attending: Thoracic Surgery (Cardiothoracic Vascular Surgery) | Admitting: Thoracic Surgery (Cardiothoracic Vascular Surgery)

## 2020-12-19 ENCOUNTER — Ambulatory Visit (INDEPENDENT_AMBULATORY_CARE_PROVIDER_SITE_OTHER): Payer: Self-pay | Admitting: Thoracic Surgery (Cardiothoracic Vascular Surgery)

## 2020-12-19 ENCOUNTER — Other Ambulatory Visit: Payer: Self-pay

## 2020-12-19 VITALS — BP 143/89 | HR 69 | Resp 20 | Ht 68.0 in

## 2020-12-19 DIAGNOSIS — Z95828 Presence of other vascular implants and grafts: Secondary | ICD-10-CM

## 2020-12-19 NOTE — Progress Notes (Signed)
      [address]       ,[place] [address]             [phone]        Nimah M Barasch [place] Medical Record #[id number] Date of Birth: 07/21/1956  Referring: No ref. provider found Primary Care: [name] Hedges, NP (Inactive) Primary Cardiologist:No primary care provider on file.  Reason for visit:   follow-up  History of Present Illness:     Ms. Villalona comes in for 1 week appointment.  She is back at home with her daughter.  She has been getting up and moving around.  Her appetite remains very poor.  Physical Exam: BP (!) 143/89 (BP Location: Right Arm, Patient Position: Sitting)   Pulse 69   Resp 20   Ht 5\' 8"  (1.727 m)   SpO2 94% Comment: RA with mask on  BMI 20.82 kg/m   Alert NAD Incision clean.  Sternum stable Abdomen soft, ND No peripheral edema   Diagnostic Studies & Laboratory data: CXR: Bilateral pleural effusions.     Assessment / Plan:   65 year old female status post type a dissection repair.  She remains quite debilitated and is brought to clinic today by a transportation service.  She continues to have bilateral effusions.  She does not appear to be uncomfortable from a respiratory standpoint.  She has been provided with an incentive spirometer and encouraged to use this on a daily basis.  I will see her back in clinic in 1 month with a chest x-ray.   Lajuana Matte 12/19/2020 4:36 PM

## 2020-12-25 ENCOUNTER — Ambulatory Visit: Payer: Medicare (Managed Care) | Admitting: Internal Medicine

## 2020-12-26 ENCOUNTER — Ambulatory Visit: Payer: Medicare (Managed Care) | Admitting: Cardiology

## 2020-12-26 NOTE — Progress Notes (Deleted)
Cardiology Office Note:    Date:  12/26/2020   ID:  CEDRIC DENISON, DOB 1956/09/22, MRN [id number]  PCP:  [name] Hedges, NP (Inactive)  Cardiologist:  No primary care provider on file.  Electrophysiologist:  None   Referring MD: No ref. provider found   No chief complaint on file. ***  History of Present Illness:    Mary Stephens is a 65 y.o. female with a hx of hypertension, diabetes, hyperlipidemia, aortic dissection status postrepair.  She presented to the ED on 11/27/2020 with chest pain.  Ultimately she left the hospital AMA.  Presented back to the ED on 12/21 with chest pain and lower extremity numbness/weakness.  CTA showed type a aortic dissection.  She was taken emergently to the OR and underwent aortic dissection repair and Hemi arch replacement of the ascending aorta.  Creatinine was up to 3.4.  Postop course was complicated by volume overload, diuresed with IV Lasix and required thoracentesis for pleural effusion.  Creatinine was 2.7 on discharge  Past Medical History:  Diagnosis Date  . Anxiety   . Arthritis   . Colon polyps   . Depression   . Diabetes mellitus without complication ([place])   . Hepatitis C   . Hyperlipidemia   . Hypertension   . Neuropathy    feet    Past Surgical History:  Procedure Laterality Date  . COLONOSCOPY  ?[id number],[id number]?   in [place] (1st colon 20 polyps removed-per pt)  . IR THORACENTESIS ASP PLEURAL SPACE W/IMG GUIDE  12/09/2020  . PARTIAL HYSTERECTOMY  2001  . REPAIR OF ACUTE ASCENDING THORACIC AORTIC DISSECTION N/A 12/02/2020   Procedure: REPAIR OF ACUTE ASCENDING THORACIC AORTIC DISSECTION VIA CIRC ARREST USING HEMASHIELD PLATINUM 28 MM VASCULAR GRAFT.;  Surgeon: Lajuana Matte, MD;  Location: [facility];  Service: Vascular;  Laterality: N/A;  [place]  . 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 (N/A)  12/02/2020  . TEE WITHOUT  CARDIOVERSION  12/02/2020   Procedure: TRANSESOPHAGEAL ECHOCARDIOGRAM (TEE);  Surgeon: Lajuana Matte, MD;  Location: [facility] OR;  Service: Vascular;;    Current Medications: No outpatient medications have been marked as taking for the 12/26/20 encounter (Appointment) with Donato Heinz, MD.     Allergies:   Patient has no known allergies.   Social History   Socioeconomic History  . Marital status: Single    Spouse name: Not on file  . Number of children: 1  . Years of education: Not on file  . Highest education level: Not on file  Occupational History  . Occupation: unemployed  Tobacco Use  . Smoking status: Current Every Day Smoker    Packs/day: 0.25    Types: Cigarettes  . Smokeless tobacco: Never Used  . Tobacco comment: admits to cuttting back 4 cigarettes/day  Vaping Use  . Vaping Use: Never used  Substance and Sexual Activity  . Alcohol use: Yes    Alcohol/week: 1.0 standard drink    Types: 1 Standard drinks or equivalent per week    Comment: wine cooler  . Drug use: Not Currently    Types: Marijuana  . Sexual activity: Not on file  Other Topics Concern  . Not on file  Social History Narrative  . Not on file   Social Determinants of Health   Financial Resource Strain: Not on file  Food Insecurity: Not on file  Transportation Needs: Not on file  Physical Activity:  Not on file  Stress: Not on file  Social Connections: Not on file     Family History: The patient's ***family history includes Diabetes in her mother. There is no history of Colon cancer, Colon polyps, Esophageal cancer, Rectal cancer, or Stomach cancer.  ROS:   Please see the history of present illness.    *** All other systems reviewed and are negative.  EKGs/Labs/Other Studies Reviewed:    The following studies were reviewed today: ***  EKG:  EKG is *** ordered today.  The ekg ordered today demonstrates ***  Recent Labs: 12/02/2020: ALT 13 12/03/2020: Magnesium  1.8 12/09/2020: Hemoglobin 10.8; Platelets 181 12/12/2020: BUN 35; Creatinine, Ser 2.74; Potassium 4.3; Sodium 142  Recent Lipid Panel    Component Value Date/Time   CHOL 227 (H) 10/28/2017 1547   TRIG 183 (H) 10/28/2017 1547   HDL 49 10/28/2017 1547   CHOLHDL 4.6 (H) 10/28/2017 1547   CHOLHDL 2.8 09/08/2016 0944   VLDL 24 09/08/2016 0944   LDLCALC 141 (H) 10/28/2017 1547    Physical Exam:    VS:  There were no vitals taken for this visit.    Wt Readings from Last 3 Encounters:  12/12/20 136 lb 14.5 oz (62.1 kg)  11/27/20 130 lb (59 kg)  11/24/20 127 lb (57.6 kg)     GEN: *** Well nourished, well developed in no acute distress HEENT: Normal NECK: No JVD; No carotid bruits LYMPHATICS: No lymphadenopathy CARDIAC: ***RRR, no murmurs, rubs, gallops RESPIRATORY:  Clear to auscultation without rales, wheezing or rhonchi  ABDOMEN: Soft, non-tender, non-distended MUSCULOSKELETAL:  No edema; No deformity  SKIN: Warm and dry NEUROLOGIC:  Alert and oriented x 3 PSYCHIATRIC:  Normal affect   ASSESSMENT:    No diagnosis found. PLAN:    In order of problems listed above:  1. ***   Medication Adjustments/Labs and Tests Ordered: Current medicines are reviewed at length with the patient today.  Concerns regarding medicines are outlined above.  No orders of the defined types were placed in this encounter.  No orders of the defined types were placed in this encounter.   There are no Patient Instructions on file for this visit.   Signed, Donato Heinz, MD  12/26/2020 7:07 AM    [place]

## 2020-12-31 ENCOUNTER — Encounter: Payer: Self-pay | Admitting: General Practice

## 2021-01-02 ENCOUNTER — Ambulatory Visit: Payer: Medicare (Managed Care) | Admitting: Thoracic Surgery (Cardiothoracic Vascular Surgery)

## 2021-01-15 ENCOUNTER — Other Ambulatory Visit: Payer: Self-pay | Admitting: Thoracic Surgery (Cardiothoracic Vascular Surgery)

## 2021-01-15 DIAGNOSIS — Z95828 Presence of other vascular implants and grafts: Secondary | ICD-10-CM

## 2021-01-16 ENCOUNTER — Other Ambulatory Visit: Payer: Self-pay

## 2021-01-16 ENCOUNTER — Encounter: Payer: Self-pay | Admitting: Thoracic Surgery (Cardiothoracic Vascular Surgery)

## 2021-01-16 ENCOUNTER — Ambulatory Visit (INDEPENDENT_AMBULATORY_CARE_PROVIDER_SITE_OTHER): Payer: Self-pay | Admitting: Thoracic Surgery (Cardiothoracic Vascular Surgery)

## 2021-01-16 ENCOUNTER — Ambulatory Visit
Admission: RE | Admit: 2021-01-16 | Discharge: 2021-01-16 | Disposition: A | Discharge status: Home / Self Care | Payer: Medicare (Managed Care) | Source: Ambulatory Visit | Attending: Thoracic Surgery (Cardiothoracic Vascular Surgery) | Admitting: Thoracic Surgery (Cardiothoracic Vascular Surgery)

## 2021-01-16 DIAGNOSIS — Z95828 Presence of other vascular implants and grafts: Secondary | ICD-10-CM

## 2021-01-16 MED ORDER — DRONABINOL 2.5 MG PO CAPS: 2.5000 mg | ORAL_CAPSULE | Freq: Two times a day (BID) | ORAL | 0 refills | Status: DC

## 2021-01-16 NOTE — Progress Notes (Signed)
      Mary Stephens       [place] [address]             [phone]        Mary Stephens [place] Medical Record #[id number] Date of Birth: Jun 03, 1956  Referring: Mary Etienne, DO Primary Care: Mary Hedges, NP (Inactive) Primary Cardiologist:No primary care provider on file.  Reason for visit:   follow-up  History of Present Illness:     Mary Stephens presents for 1 month follow-up appointment.  Overall she is doing well.  She occasionally has some dizziness when she gets up out of her chair too quickly.  She is up and moving around however.  She states that when she checks her blood pressure systolics range from the 496P to the 190s.  She complains of a poor appetite and would like some assistance with this.  Physical Exam: There were no vitals taken for this visit.  Alert NAD Incision clean.  Sternum stable Abdomen soft, ND No peripheral edema   Diagnostic Studies & Laboratory data: CXR: Small right effusion     Assessment / Plan:   65 year old female status post emergency type a dissection repair.  She continues to have poorly controlled hypertension.  She also continues to lose weight.  I have given her referral to hypertension specialists for further assessment and treatment Of her given her prescription for Marinol to assist with poor appetite She will return to clinic to see me in 6 months with a CT aortogram. She is cleared for cardiac rehab   Mary Stephens 01/16/2021 3:39 PM

## 2021-01-19 ENCOUNTER — Telehealth (HOSPITAL_COMMUNITY): Payer: Self-pay

## 2021-01-19 NOTE — Telephone Encounter (Signed)
Mary Stephens with [facility] called and stated pt family decided to do HHPT. And pt would not be doing CR at this time.  Closed referral

## 2021-01-28 ENCOUNTER — Telehealth: Payer: Self-pay | Admitting: *Deleted

## 2021-01-28 NOTE — Telephone Encounter (Signed)
LMOM with pt to call office back

## 2021-01-28 NOTE — Telephone Encounter (Signed)
Spoke with Nira Conn at [facility], who states she just received information from pt's PCP that he would like pt's colonoscopy pushed back "until at least April."  Heather recommends June or later. I cancelled PV and colonoscopy for now; recall put in for later date.  Explained to Nira Conn that pt would receive letter after June 1st to schedule colonoscopy. Nira Conn states she will call pt regarding this.

## 2021-01-28 NOTE — Telephone Encounter (Signed)
Noted while prepping chart for PV that pt had surgery on 12-02-20 for a repair of acute ascending thoracic aortic dissection.  I left a message at [facility] to call back to set up an OV with Dr. Henrene Pastor or one other extenders instead of a previsit.

## 2021-02-02 ENCOUNTER — Telehealth: Payer: Self-pay | Admitting: *Deleted

## 2021-02-02 ENCOUNTER — Other Ambulatory Visit: Payer: Medicare (Managed Care)

## 2021-02-02 NOTE — Telephone Encounter (Signed)
Attempted to return phone call to Mary Stephens but was unsuccessful. VM left for return call.

## 2021-02-03 ENCOUNTER — Telehealth: Payer: Self-pay

## 2021-02-03 NOTE — Telephone Encounter (Signed)
Patient contacted the office asking if she could do cleaning around the house.  She is s/p Aneurysm Repair by Dr. Kipp Brood 12/02/21.  Patient advised that she could do light house work, no heavy lifting.  12 week post-op restrictions would be lifted March 15th.  She acknowledged receipt.

## 2021-02-16 DIAGNOSIS — H61303 Acquired stenosis of external ear canal, unspecified, bilateral: Secondary | ICD-10-CM | POA: Insufficient documentation

## 2021-02-19 ENCOUNTER — Encounter: Payer: Medicare (Managed Care) | Admitting: Internal Medicine

## 2021-03-18 ENCOUNTER — Encounter: Payer: Self-pay | Admitting: Cardiovascular Disease

## 2021-04-10 ENCOUNTER — Encounter: Payer: Self-pay | Admitting: Internal Medicine

## 2021-04-14 ENCOUNTER — Encounter: Payer: Self-pay | Admitting: Internal Medicine

## 2021-05-12 ENCOUNTER — Ambulatory Visit: Payer: Medicare (Managed Care) | Admitting: Cardiovascular Disease

## 2021-05-14 ENCOUNTER — Other Ambulatory Visit: Payer: Self-pay | Admitting: Vascular Surgery

## 2021-05-14 DIAGNOSIS — I7100 Dissection of unspecified site of aorta: Secondary | ICD-10-CM

## 2021-06-08 ENCOUNTER — Telehealth: Payer: Self-pay | Admitting: *Deleted

## 2021-06-08 NOTE — Telephone Encounter (Signed)
Noted. Thanks.

## 2021-06-08 NOTE — Telephone Encounter (Signed)
John,  This pt had an aortic repair  12-02-2020- she had a TEE inop with EF 50-55%, no AVS- she was cleared for cardiac rehab in Feb 2022-  just wanted you to review chart and clear her for [facility] colon-please review  Thanks,Marie PV

## 2021-07-01 ENCOUNTER — Encounter: Payer: Medicare (Managed Care) | Admitting: Internal Medicine

## 2021-07-02 ENCOUNTER — Other Ambulatory Visit: Payer: Self-pay

## 2021-07-02 ENCOUNTER — Ambulatory Visit
Admission: RE | Admit: 2021-07-02 | Discharge: 2021-07-02 | Disposition: A | Discharge status: Home / Self Care | Payer: Medicare (Managed Care) | Source: Ambulatory Visit | Attending: Gerontology | Admitting: Gerontology

## 2021-07-02 DIAGNOSIS — M8580 Other specified disorders of bone density and structure, unspecified site: Secondary | ICD-10-CM

## 2021-07-15 ENCOUNTER — Other Ambulatory Visit: Payer: Medicare (Managed Care)

## 2021-07-24 ENCOUNTER — Ambulatory Visit
Admission: RE | Admit: 2021-07-24 | Discharge: 2021-07-24 | Disposition: A | Discharge status: Home / Self Care | Payer: Medicare (Managed Care) | Source: Ambulatory Visit | Attending: Vascular Surgery | Admitting: Vascular Surgery

## 2021-07-24 ENCOUNTER — Other Ambulatory Visit: Payer: Self-pay

## 2021-07-24 DIAGNOSIS — I7100 Dissection of unspecified site of aorta: Secondary | ICD-10-CM

## 2021-07-31 ENCOUNTER — Other Ambulatory Visit: Payer: Self-pay

## 2021-07-31 ENCOUNTER — Ambulatory Visit (INDEPENDENT_AMBULATORY_CARE_PROVIDER_SITE_OTHER): Payer: Medicare (Managed Care) | Admitting: Thoracic Surgery (Cardiothoracic Vascular Surgery)

## 2021-07-31 VITALS — BP 126/85 | HR 60 | Resp 20 | Ht 68.0 in | Wt 114.0 lb

## 2021-07-31 DIAGNOSIS — I7101 Dissection of thoracic aorta: Secondary | ICD-10-CM

## 2021-07-31 DIAGNOSIS — I71019 Dissection of thoracic aorta, unspecified: Secondary | ICD-10-CM

## 2021-07-31 NOTE — Progress Notes (Signed)
      [address]       [place] [address]             [phone]        Tanieka M [place] Medical Record #[id number] Date of Birth: Sep 09, 1956  Referring: Deno Etienne, DO Primary Care: [name] Hedges, NP (Inactive) Primary Cardiologist:None  Reason for visit:   follow-up  History of Present Illness:     Mary Stephens is a 65 year old female who underwent an acute type a dissection repair in December 2021.  She presents for her 48-month follow-up appointment.  She is doing much better.  Her blood pressure is under better control.  She only complains of the fact that she has been unable to gain weight.  Physical Exam: BP 126/85   Pulse 60   Resp 20   Ht 5\' 8"  (1.727 m)   Wt 114 lb (51.7 kg)   SpO2 98%   BMI 17.33 kg/m   Alert NAD Incision clean.  Sternum stable Abdomen soft, ND No peripheral edema   Diagnostic Studies & Laboratory data: CT chest:  IMPRESSION: 1. Stable postsurgical change compatible with patient's aortic dissection repair with graft over the ascending thoracic aorta. 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 2. Mild atherosclerotic coronary artery disease. Small amount of pericardial fluid. 3. Stable cystic changes of the kidneys. Stable mild prominence of the adrenal glands likely adenomatous change. 4. Mild emphysematous disease.  Assessment / Plan:   65 yo female s/p type A dissection repair.  CT stable   Will f/u in 1 yrs with CT chest in [facility] clinic   Lajuana Matte 07/31/2021 5:27 PM

## 2021-08-21 ENCOUNTER — Telehealth: Payer: Self-pay

## 2021-08-21 NOTE — Telephone Encounter (Signed)
Multiple attempts made to reach patient at cell and home numbers- no VM to be able to leave a message- will attempt to reach patient at a later time to R/S PV appt- if unable to reach patient-a no show letter will be mailed to this patient

## 2021-08-21 NOTE — Telephone Encounter (Signed)
No show letter being sent to the patient as PV RN unable to reach patient at this time to R/S PV appt; PV and procedure appt's cancelled;

## 2021-09-03 ENCOUNTER — Encounter: Payer: Medicare (Managed Care) | Admitting: Internal Medicine

## 2021-09-04 ENCOUNTER — Encounter: Payer: Medicare (Managed Care) | Admitting: Internal Medicine

## 2022-01-10 ENCOUNTER — Emergency Department (HOSPITAL_COMMUNITY)
Admission: EM | Admit: 2022-01-10 | Discharge: 2022-01-10 | Disposition: A | Discharge status: Home / Self Care | Payer: Medicare (Managed Care) | Attending: Emergency Medicine | Admitting: Emergency Medicine

## 2022-01-10 ENCOUNTER — Encounter (HOSPITAL_COMMUNITY): Payer: Self-pay | Admitting: Emergency Medicine

## 2022-01-10 DIAGNOSIS — Z7984 Long term (current) use of oral hypoglycemic drugs: Secondary | ICD-10-CM | POA: Diagnosis not present

## 2022-01-10 DIAGNOSIS — R197 Diarrhea, unspecified: Secondary | ICD-10-CM | POA: Diagnosis not present

## 2022-01-10 DIAGNOSIS — Z7982 Long term (current) use of aspirin: Secondary | ICD-10-CM | POA: Insufficient documentation

## 2022-01-10 DIAGNOSIS — E119 Type 2 diabetes mellitus without complications: Secondary | ICD-10-CM | POA: Insufficient documentation

## 2022-01-10 DIAGNOSIS — I10 Essential (primary) hypertension: Secondary | ICD-10-CM | POA: Insufficient documentation

## 2022-01-10 DIAGNOSIS — R112 Nausea with vomiting, unspecified: Secondary | ICD-10-CM | POA: Insufficient documentation

## 2022-01-10 DIAGNOSIS — Z79899 Other long term (current) drug therapy: Secondary | ICD-10-CM | POA: Insufficient documentation

## 2022-01-10 LAB — COMPREHENSIVE METABOLIC PANEL
ALT: 28 U/L (ref 0–44)
AST: 65 U/L — ABNORMAL HIGH (ref 15–41)
Albumin: 3.7 g/dL (ref 3.5–5.0)
Alkaline Phosphatase: 80 U/L (ref 38–126)
Anion gap: 10 (ref 5–15)
BUN: 30 mg/dL — ABNORMAL HIGH (ref 8–23)
CO2: 29 mmol/L (ref 22–32)
Calcium: 9.3 mg/dL (ref 8.9–10.3)
Chloride: 102 mmol/L (ref 98–111)
Creatinine, Ser: 3.02 mg/dL — ABNORMAL HIGH (ref 0.44–1.00)
GFR, Estimated: 16 mL/min — ABNORMAL LOW (ref >60)
Glucose, Bld: 134 mg/dL — ABNORMAL HIGH (ref 70–99)
Potassium: 3 mmol/L — ABNORMAL LOW (ref 3.5–5.1)
Sodium: 141 mmol/L (ref 135–145)
Total Bilirubin: 1 mg/dL (ref 0.3–1.2)
Total Protein: 7.2 g/dL (ref 6.5–8.1)

## 2022-01-10 LAB — CBC WITH DIFFERENTIAL/PLATELET
Abs Immature Granulocytes: 0.02 10*3/uL (ref 0.00–0.07)
Basophils Absolute: 0 10*3/uL (ref 0.0–0.1)
Basophils Relative: 0 %
Eosinophils Absolute: 0.2 10*3/uL (ref 0.0–0.5)
Eosinophils Relative: 2 %
HCT: 38.8 % (ref 36.0–46.0)
Hemoglobin: 13.6 g/dL (ref 12.0–15.0)
Immature Granulocytes: 0 %
Lymphocytes Relative: 25 %
Lymphs Abs: 2 10*3/uL (ref 0.7–4.0)
MCH: 32.6 pg (ref 26.0–34.0)
MCHC: 35.1 g/dL (ref 30.0–36.0)
MCV: 93 fL (ref 80.0–100.0)
Monocytes Absolute: 0.8 10*3/uL (ref 0.1–1.0)
Monocytes Relative: 10 %
Neutro Abs: 5.1 10*3/uL (ref 1.7–7.7)
Neutrophils Relative %: 63 %
Platelets: 256 10*3/uL (ref 150–400)
RBC: 4.17 MIL/uL (ref 3.87–5.11)
RDW: 12 % (ref 11.5–15.5)
WBC: 8.2 10*3/uL (ref 4.0–10.5)
nRBC: 0 % (ref 0.0–0.2)

## 2022-01-10 LAB — LIPASE, BLOOD: Lipase: 35 U/L (ref 11–51)

## 2022-01-10 MED ORDER — SODIUM CHLORIDE 0.9 % IV BOLUS
1000.0000 mL | Freq: Once | INTRAVENOUS | Status: AC
  Administered 2022-01-10: 1000 mL via INTRAVENOUS

## 2022-01-10 MED ORDER — METOCLOPRAMIDE HCL 5 MG/ML IJ SOLN
10.0000 mg | INTRAMUSCULAR | Status: AC
  Administered 2022-01-10: 10 mg via INTRAVENOUS
  Filled 2022-01-10: qty 2

## 2022-01-10 MED ORDER — ONDANSETRON HCL 4 MG PO TABS: 4.0000 mg | ORAL_TABLET | Freq: Four times a day (QID) | ORAL | 0 refills | Status: DC

## 2022-01-10 NOTE — ED Provider Notes (Signed)
[facility] EMERGENCY DEPARTMENT Provider Note   CSN: [id number] Arrival date & time: 01/10/22  0053     History  Chief Complaint  Patient presents with   Emesis   Diarrhea    ZERENITY BOWRON is a 66 y.o. female.  The history is provided by the patient and medical records.   66 year old female with history of hepatitis C, diabetes, hypertension, presenting to the ED with nausea, vomiting, and diarrhea.  States she woke up early this morning with symptoms.  She denies any sick contacts.  She does admit to eating some leftovers from the fridge that may have been "spoiled".  She denies any blood in the stool.  She was given some IV fluids and Zofran by EMS with some very minor improvement of her symptoms.  Still feels very nauseated.  Home Medications Prior to Admission medications   Medication Sig Start Date End Date Taking? Authorizing Provider  acetaminophen (TYLENOL) 500 MG tablet Take 1 tablet (500 mg total) by mouth every 6 (six) hours as needed for headache. 12/10/20   Barrett, Erin R, PA-C  amLODipine (NORVASC) 10 MG tablet Take 1 tablet (10 mg total) by mouth every morning. Patient taking differently: Take 10 mg by mouth daily. 01/27/18   Gildardo Pounds, NP  Artificial Saliva (DRY MOUTH SPRAY MT) Use as directed 1 spray in the mouth or throat 6 (six) times daily. Biotene    [provider]  aspirin EC 325 MG EC tablet Take 1 tablet (325 mg total) by mouth daily. 12/10/20   Barrett, Erin R, PA-C  atorvastatin (LIPITOR) 10 MG tablet Take 1 tablet (10 mg total) by mouth daily. 01/27/18   Gildardo Pounds, NP  calcium carbonate (OSCAL) 1500 (600 Ca) MG TABS tablet Take 600 mg of elemental calcium by mouth daily with breakfast.    [provider]  Cholecalciferol (VITAMIN D-3) 125 MCG (5000 UT) TABS Take 5,000 Units by mouth daily.    [provider]  cloNIDine (CATAPRES) 0.2 MG tablet Take 1 tablet (0.2 mg total) by mouth 2 (two) times  daily. 12/12/20   Nani Skillern, PA-C  diclofenac Sodium (VOLTAREN) 1 % GEL Apply 2-4 g topically 4 (four) times daily as needed (for bilateral foot pain).    [provider]  dronabinol (MARINOL) 2.5 MG capsule Take 1 capsule (2.5 mg total) by mouth 2 (two) times daily before lunch and supper. 01/16/21   Lightfoot, Lucile Crater, MD  gabapentin (NEURONTIN) 300 MG capsule Take 300 mg by mouth 2 (two) times daily.    [provider]  Glucerna (GLUCERNA) LIQD Take 237 mLs by mouth 2 (two) times daily between meals.    [provider]  glucose blood test strip Check BS twice daily, 250.00 09/12/14   Lance Bosch, NP  glucose monitoring kit (FREESTYLE) monitoring kit 1 each by Does not apply route as needed for other. Please check BS twice daily before meals, 250.00 09/12/14   Lance Bosch, NP  Lancets (FREESTYLE) lancets Check BS twice daily before meals, 250.00 09/12/14   Chari Manning A, NP  melatonin 3 MG TABS tablet Take 3 mg by mouth at bedtime as needed (insomnia).    [provider]  metoprolol tartrate (LOPRESSOR) 50 MG tablet Take 1 tablet (50 mg total) by mouth 2 (two) times daily. 12/10/20   Barrett, Erin R, PA-C  Na Sulfate-K Sulfate-Mg Sulf 17.5-3.13-1.6 GM/177ML SOLN Suprep (no substitutions)-TAKE AS DIRECTED. 11/24/20  Irene Shipper, MD  oxyCODONE (OXY IR/ROXICODONE) 5 MG immediate release tablet Take 1 tablet (5 mg total) by mouth every 4 (four) hours as needed for severe pain. 12/12/20   Nani Skillern, PA-C  glipiZIDE (GLUCOTROL XL) 5 MG 24 hr tablet Take 1 tablet (5 mg total) by mouth daily with breakfast. Patient not taking: No sig reported 01/27/18 06/20/20  Gildardo Pounds, NP      Allergies    Patient has no known allergies.    Review of Systems   Review of Systems  Gastrointestinal:  Positive for diarrhea, nausea and vomiting.  All other systems reviewed and are negative.  Physical Exam Updated Vital Signs BP (!) 184/121  (BP Location: Right Arm)    Pulse 75    Temp 98.3 F (36.8 C) (Oral)    Resp 13    Ht 5' 8" (1.727 m)    Wt 60.3 kg    SpO2 100%    BMI 20.22 kg/m   Physical Exam Vitals and nursing note reviewed.  Constitutional:      Appearance: She is well-developed.  HENT:     Head: Normocephalic and atraumatic.  Eyes:     Conjunctiva/sclera: Conjunctivae normal.     Pupils: Pupils are equal, round, and reactive to light.  Cardiovascular:     Rate and Rhythm: Normal rate and regular rhythm.     Heart sounds: Normal heart sounds.  Pulmonary:     Effort: Pulmonary effort is normal.     Breath sounds: Normal breath sounds.  Abdominal:     General: Bowel sounds are normal.     Palpations: Abdomen is soft.  Musculoskeletal:        General: Normal range of motion.     Cervical back: Normal range of motion.  Skin:    General: Skin is warm and dry.  Neurological:     Mental Status: She is alert and oriented to person, place, and time.    ED Results / Procedures / Treatments   Labs (all labs ordered are listed, but only abnormal results are displayed) Labs Reviewed  COMPREHENSIVE METABOLIC PANEL - Abnormal; Notable for the following components:      Result Value   Potassium 3.0 (*)    Glucose, Bld 134 (*)    BUN 30 (*)    Creatinine, Ser 3.02 (*)    AST 65 (*)    GFR, Estimated 16 (*)    All other components within normal limits  CBC WITH DIFFERENTIAL/PLATELET  LIPASE, BLOOD  URINALYSIS, ROUTINE W REFLEX MICROSCOPIC    EKG None  Radiology No results found.  Procedures Procedures    Medications Ordered in ED Medications  metoCLOPramide (REGLAN) injection 10 mg (has no administration in time range)  sodium chloride 0.9 % bolus 1,000 mL (has no administration in time range)    ED Course/ Medical Decision Making/ A&P                           Medical Decision Making Amount and/or Complexity of Data Reviewed External Data Reviewed: labs. Labs: ordered. ECG/medicine tests:  ordered and independent interpretation performed.  Risk Prescription drug management.   66 y.o. F her with N/V/D, think she ate some expired food left in refrigerator.  No bloody stools.  Zofran given with EMS, still nauseated.  She is afebrile, non-toxic.  Abdomen soft, non-tender to palpation.  Will plan for labs, IVF, reglan.  2:19 AM Feeling  much better after IVF and reglan.  No further emesis or diarrhea.  Will allow IVFB to finish.  Labs today are reassuring-- SrCr elevated but as baseline when compared with prior values.    2:54 AM IVF completed.  Continues to feel better.  VSS.  Appropriate for discharge home with continued symptomatic care.  Rx zofran.  Encouraged hydration, gentle diet and progress as tolerated.  Follow-up with PCP.  Return here for new concerns.  Final Clinical Impression(s) / ED Diagnoses Final diagnoses:  Nausea vomiting and diarrhea    Rx / DC Orders ED Discharge Orders          Ordered    ondansetron (ZOFRAN) 4 MG tablet  Every 6 hours        01/10/22 0250              Larene Pickett, PA-C 01/10/22 0255    Mesner, Corene Cornea, MD 01/10/22 [phone]

## 2022-01-10 NOTE — ED Triage Notes (Signed)
Pt bib EMS from home for N/V/D and abdominal pain, LUQ. States that she has not been feeling well since waking up; reports not being around anyone sick; unsure of whether or not she ate expired food.  250 mL NS and 4mg  zofran given by EMS via 20G IV in L AC.   148//92 CBG 157 All other VSS.

## 2022-01-10 NOTE — Discharge Instructions (Addendum)
Take the prescribed medication as directed.  Make sure to stay hydrated, gentle diet for now and progress back to normal as tolerated. Follow-up with your primary care doctor. Return to the ED for new or worsening symptoms.

## 2022-02-22 ENCOUNTER — Encounter: Payer: Medicare (Managed Care) | Admitting: Internal Medicine

## 2022-03-25 ENCOUNTER — Ambulatory Visit (AMBULATORY_SURGERY_CENTER): Payer: Medicare (Managed Care) | Admitting: *Deleted

## 2022-03-25 VITALS — Ht 68.0 in | Wt 133.0 lb

## 2022-03-25 DIAGNOSIS — Z86010 Personal history of colonic polyps: Secondary | ICD-10-CM

## 2022-03-25 MED ORDER — NA SULFATE-K SULFATE-MG SULF 17.5-3.13-1.6 GM/177ML PO SOLN: 1.0000 | Freq: Once | ORAL | 0 refills | Status: DC

## 2022-03-25 MED ORDER — NA SULFATE-K SULFATE-MG SULF 17.5-3.13-1.6 GM/177ML PO SOLN
1.0000 | Freq: Once | ORAL | 0 refills | Status: DC
Start: 2022-03-25 — End: 2022-03-25

## 2022-03-25 MED ORDER — NA SULFATE-K SULFATE-MG SULF 17.5-3.13-1.6 GM/177ML PO SOLN
1.0000 | Freq: Once | ORAL | 0 refills | Status: AC
Start: 2022-03-25 — End: 2022-03-25

## 2022-03-25 NOTE — Progress Notes (Signed)
Having hemorrhoid issues  ?No egg or soy allergy known to patient  ?No issues known to pt with past sedation with any surgeries or procedures ?Patient denies ever being told they had issues or difficulty with intubation  ?No FH of Malignant Hyperthermia ?Pt is not on diet pills ?Pt is not on  home 02  ?Pt is not on blood thinners  ?Pt denies issues with constipation  ?No A fib or A flutter ?  ?[facility]-  printed Suprep and faxed to [facility] at [phone] pt aware  ? ?PV completed over the phone. Pt verified name, DOB, address and insurance during PV today.  ?Pt mailed instruction packet with copy of consent form to read and not return, and instructions.  ?Pt encouraged to call with questions or issues.  ? ?

## 2022-04-08 ENCOUNTER — Encounter: Payer: Medicare (Managed Care) | Admitting: Internal Medicine

## 2022-06-11 ENCOUNTER — Other Ambulatory Visit: Payer: Self-pay | Admitting: *Deleted

## 2022-06-11 DIAGNOSIS — Z95828 Presence of other vascular implants and grafts: Secondary | ICD-10-CM

## 2022-06-11 DIAGNOSIS — I7101 Dissection of ascending aorta: Secondary | ICD-10-CM

## 2022-07-02 ENCOUNTER — Other Ambulatory Visit: Payer: Medicare (Managed Care)

## 2022-07-13 ENCOUNTER — Ambulatory Visit (INDEPENDENT_AMBULATORY_CARE_PROVIDER_SITE_OTHER): Payer: Medicare (Managed Care) | Admitting: Podiatry

## 2022-07-13 ENCOUNTER — Encounter: Payer: Self-pay | Admitting: Podiatry

## 2022-07-13 DIAGNOSIS — L84 Corns and callosities: Secondary | ICD-10-CM | POA: Diagnosis not present

## 2022-07-13 DIAGNOSIS — E1142 Type 2 diabetes mellitus with diabetic polyneuropathy: Secondary | ICD-10-CM

## 2022-07-13 NOTE — Progress Notes (Signed)
  Subjective:  Patient ID: Mary Stephens, female    DOB: 01-31-1956,   MRN: [id number]  Chief Complaint  Patient presents with   Callouses    Right foot callus trim -  patient has neuropathy , can not tell me about pain    66 y.o. female presents concern of  painful calluses. Requesting to have them trimmed today. Relates burning and tingling in their feet. Patient is diabetic and last A1c was  Lab Results  Component Value Date   HGBA1C 5.3 01/27/2018   .   PCP:  [name] Hedges, NP (Inactive)    . Denies any other pedal complaints. Denies n/v/f/c.   Past Medical History:  Diagnosis Date   Anxiety    Arthritis    Cataract    forming- very small   Colon polyps    Depression    Diabetes mellitus without complication ([place])    off meds for diabetes- diet controlled   GERD (gastroesophageal reflux disease)    H/O aortic dissection 2021   Hepatitis C    Hyperlipidemia    Hypertension    Neuropathy    feet    Objective:  Physical Exam: Vascular: DP/PT pulses 2/4 bilateral. CFT <3 seconds. Normal hair growth on digits. No edema.  Skin. No lacerations or abrasions bilateral feet. Hypereratotic sub third metatarsal on left and fifth metatarsal as well as first and fifth metatarsal on the right.  Musculoskeletal: MMT 5/5 bilateral lower extremities in DF, PF, Inversion and Eversion. Deceased ROM in DF of ankle joint.  Neurological: Sensation intact to light touch. Protective sensation diminished.   Assessment:   1. Type 2 diabetes mellitus with diabetic polyneuropathy, without long-term current use of insulin ([place])      Plan:  Patient was evaluated and treated and all questions answered. -Discussed and educated patient on diabetic foot care, especially with  regards to the vascular, neurological and musculoskeletal systems.  -Stressed the importance of good glycemic control and the detriment of not  controlling glucose levels in relation to the foot. -Discussed supportive  shoes at all times and checking feet regularly.  -Mechanically debrided hyperkeratotic tissue without incidnet with chisel today.  -Answered all patient questions -Patient to return  in 3 months for at risk foot care -Patient advised to call the office if any problems or questions arise in the meantime.   Lorenda Peck, DPM

## 2022-07-16 ENCOUNTER — Inpatient Hospital Stay: Admission: RE | Admit: 2022-07-16 | Payer: Medicare (Managed Care) | Source: Ambulatory Visit

## 2022-07-30 NOTE — Progress Notes (Deleted)
[address]       [place] [address]             [phone]      HPI:  Mary Stephens is a 66 yo female with history of Hepatitis C, HTN, and Diabetes.  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 Kipp Brood in December 2021.  He was last evaluated by Dr. Kipp Brood in August of 2022 at which time she was doing well.  She presents for 1 year follow up with repeat CTA chest.    Current Outpatient Medications  Medication Sig Dispense Refill   acetaminophen (TYLENOL) 500 MG tablet Take 1 tablet (500 mg total) by mouth every 6 (six) hours as needed for headache. 30 tablet 0   amLODipine (NORVASC) 10 MG tablet Take 1 tablet (10 mg total) by mouth every morning. (Patient taking differently: Take 10 mg by mouth daily.) 90 tablet 3   Artificial Saliva (DRY MOUTH SPRAY MT) Use as directed 1 spray in the mouth or throat 6 (six) times daily. Biotene     aspirin EC 325 MG EC tablet Take 1 tablet (325 mg total) by mouth daily. 30 tablet 0   atorvastatin (LIPITOR) 10 MG tablet Take 1 tablet (10 mg total) by mouth daily. 90 tablet 3   calcium carbonate (OSCAL) 1500 (600 Ca) MG TABS tablet Take 600 mg of elemental calcium by mouth daily with breakfast.     Cholecalciferol (VITAMIN D-3) 125 MCG (5000 UT) TABS Take 5,000 Units by mouth daily.     cloNIDine (CATAPRES) 0.2 MG tablet Take 1 tablet (0.2 mg total) by mouth 2 (two) times daily. (Patient not taking: Reported on 03/25/2022) 60 tablet 3   diclofenac Sodium (VOLTAREN) 1 % GEL Apply 2-4 g topically 4 (four) times daily as needed (for bilateral foot pain).     dronabinol (MARINOL) 2.5 MG capsule Take 1 capsule (2.5 mg total) by mouth 2 (two) times daily before lunch and supper. 60 capsule 0   gabapentin (NEURONTIN) 300 MG capsule Take 300 mg by mouth 2 (two) times daily.     Glucerna (GLUCERNA) LIQD Take 237 mLs by mouth 2 (two) times daily between meals. (Patient not taking: Reported on 03/25/2022)     glucose blood  test strip Check BS twice daily, 250.00 100 each 12   glucose monitoring kit (FREESTYLE) monitoring kit 1 each by Does not apply route as needed for other. Please check BS twice daily before meals, 250.00 1 each 0   Lancets (FREESTYLE) lancets Check BS twice daily before meals, 250.00 100 each 12   melatonin 3 MG TABS tablet Take 3 mg by mouth at bedtime as needed (insomnia).     metoprolol tartrate (LOPRESSOR) 50 MG tablet Take 1 tablet (50 mg total) by mouth 2 (two) times daily.     ondansetron (ZOFRAN) 4 MG tablet Take 1 tablet (4 mg total) by mouth every 6 (six) hours. 12 tablet 0   No current facility-administered medications for this visit.    Physical Exam: ***  Diagnostic Tests: ***  Impression: ***  Plan: ***   [name], PA-C [facility] [phone]

## 2022-08-09 ENCOUNTER — Ambulatory Visit (HOSPITAL_COMMUNITY): Admission: RE | Admit: 2022-08-09 | Payer: Medicare (Managed Care) | Source: Ambulatory Visit

## 2022-08-10 NOTE — Progress Notes (Deleted)
[address]       [place] [address]             [phone]        CHISTINE BASIL [id number] 06-May-1956  History of Present Illness:  Mary Stephens is a 66 yo female with history of Hepatitis C, HTN, Nicotine abuse, GERD, DM, Depression, and S/P Repair of Type A Aortic Dissection by Dr. Cliffton Asters in December of 2021.  She was last seen by Dr. Cliffton Asters in August of last year and presents today for her 1 year follow up.       Current Outpatient Medications on File Prior to Visit  Medication Sig Dispense Refill   acetaminophen (TYLENOL) 500 MG tablet Take 1 tablet (500 mg total) by mouth every 6 (six) hours as needed for headache. 30 tablet 0   amLODipine (NORVASC) 10 MG tablet Take 1 tablet (10 mg total) by mouth every morning. (Patient taking differently: Take 10 mg by mouth daily.) 90 tablet 3   Artificial Saliva (DRY MOUTH SPRAY MT) Use as directed 1 spray in the mouth or throat 6 (six) times daily. Biotene     aspirin EC 325 MG EC tablet Take 1 tablet (325 mg total) by mouth daily. 30 tablet 0   atorvastatin (LIPITOR) 10 MG tablet Take 1 tablet (10 mg total) by mouth daily. 90 tablet 3   calcium carbonate (OSCAL) 1500 (600 Ca) MG TABS tablet Take 600 mg of elemental calcium by mouth daily with breakfast.     Cholecalciferol (VITAMIN D-3) 125 MCG (5000 UT) TABS Take 5,000 Units by mouth daily.     cloNIDine (CATAPRES) 0.2 MG tablet Take 1 tablet (0.2 mg total) by mouth 2 (two) times daily. (Patient not taking: Reported on 03/25/2022) 60 tablet 3   diclofenac Sodium (VOLTAREN) 1 % GEL Apply 2-4 g topically 4 (four) times daily as needed (for bilateral foot pain).     dronabinol (MARINOL) 2.5 MG capsule Take 1 capsule (2.5 mg total) by mouth 2 (two) times daily before lunch and supper. 60 capsule 0   gabapentin (NEURONTIN) 300 MG capsule Take 300 mg by mouth 2 (two) times daily.     Glucerna (GLUCERNA) LIQD Take 237 mLs by mouth 2 (two) times daily between  meals. (Patient not taking: Reported on 03/25/2022)     glucose blood test strip Check BS twice daily, 250.00 100 each 12   glucose monitoring kit (FREESTYLE) monitoring kit 1 each by Does not apply route as needed for other. Please check BS twice daily before meals, 250.00 1 each 0   Lancets (FREESTYLE) lancets Check BS twice daily before meals, 250.00 100 each 12   melatonin 3 MG TABS tablet Take 3 mg by mouth at bedtime as needed (insomnia).     metoprolol tartrate (LOPRESSOR) 50 MG tablet Take 1 tablet (50 mg total) by mouth 2 (two) times daily.     ondansetron (ZOFRAN) 4 MG tablet Take 1 tablet (4 mg total) by mouth every 6 (six) hours. 12 tablet 0   [DISCONTINUED] glipiZIDE (GLUCOTROL XL) 5 MG 24 hr tablet Take 1 tablet (5 mg total) by mouth daily with breakfast. (Patient not taking: No sig reported) 90 tablet 3   No current facility-administered medications on file prior to visit.     There were no vitals taken for this visit.  Physical Exam  CTA Results:      A/P:      Risk Modification:  Statin:  ***  Smoking cessation instruction/counseling given:  {CHL AMB [facility] SMOKING CESSATION COUNSELING:20758}  Patient was counseled on importance of Blood Pressure Control.  Despite Medical intervention if the patient notices persistently elevated blood pressure readings.  They are instructed to contact their Primary Care Physician  Please avoid use of Fluoroquinolones as this can potentially increase your risk of Aortic Rupture and/or Dissection  Patient educated on signs and symptoms of Aortic Dissection, handout also provided in AVS  Daishawn Lauf, PA-C 08/10/22

## 2022-11-15 ENCOUNTER — Other Ambulatory Visit: Payer: Self-pay | Admitting: Thoracic Surgery (Cardiothoracic Vascular Surgery)

## 2022-11-15 ENCOUNTER — Ambulatory Visit
Admission: RE | Admit: 2022-11-15 | Discharge: 2022-11-15 | Disposition: A | Discharge status: Home / Self Care | Payer: Medicare (Managed Care) | Source: Ambulatory Visit | Attending: Thoracic Surgery (Cardiothoracic Vascular Surgery) | Admitting: Thoracic Surgery (Cardiothoracic Vascular Surgery)

## 2022-11-15 DIAGNOSIS — Z95828 Presence of other vascular implants and grafts: Secondary | ICD-10-CM

## 2022-11-15 DIAGNOSIS — I7101 Dissection of ascending aorta: Secondary | ICD-10-CM

## 2022-11-17 NOTE — Progress Notes (Unsigned)
[address]       [place] [address]             Wingo Gotto [id number] September 20, 1956  History of Present Illness:   Mary Stephens is a 66 yo female S/P Repair of Aortic Dissection by Dr. Kipp Brood in 2021.  She presents today for surveillance    Current Outpatient Medications on File Prior to Visit  Medication Sig Dispense Refill   acetaminophen (TYLENOL) 500 MG tablet Take 1 tablet (500 mg total) by mouth every 6 (six) hours as needed for headache. 30 tablet 0   amLODipine (NORVASC) 10 MG tablet Take 1 tablet (10 mg total) by mouth every morning. (Patient taking differently: Take 10 mg by mouth daily.) 90 tablet 3   Artificial Saliva (DRY MOUTH SPRAY MT) Use as directed 1 spray in the mouth or throat 6 (six) times daily. Biotene     aspirin EC 325 MG EC tablet Take 1 tablet (325 mg total) by mouth daily. 30 tablet 0   atorvastatin (LIPITOR) 10 MG tablet Take 1 tablet (10 mg total) by mouth daily. 90 tablet 3   calcium carbonate (OSCAL) 1500 (600 Ca) MG TABS tablet Take 600 mg of elemental calcium by mouth daily with breakfast.     Cholecalciferol (VITAMIN D-3) 125 MCG (5000 UT) TABS Take 5,000 Units by mouth daily.     cloNIDine (CATAPRES) 0.2 MG tablet Take 1 tablet (0.2 mg total) by mouth 2 (two) times daily. (Patient not taking: Reported on 03/25/2022) 60 tablet 3   diclofenac Sodium (VOLTAREN) 1 % GEL Apply 2-4 g topically 4 (four) times daily as needed (for bilateral foot pain).     dronabinol (MARINOL) 2.5 MG capsule Take 1 capsule (2.5 mg total) by mouth 2 (two) times daily before lunch and supper. 60 capsule 0   gabapentin (NEURONTIN) 300 MG capsule Take 300 mg by mouth 2 (two) times daily.     Glucerna (GLUCERNA) LIQD Take 237 mLs by mouth 2 (two) times daily between meals. (Patient not taking: Reported on 03/25/2022)     glucose blood test strip Check BS twice daily, 250.00 100 each 12   glucose monitoring kit (FREESTYLE)  monitoring kit 1 each by Does not apply route as needed for other. Please check BS twice daily before meals, 250.00 1 each 0   Lancets (FREESTYLE) lancets Check BS twice daily before meals, 250.00 100 each 12   melatonin 3 MG TABS tablet Take 3 mg by mouth at bedtime as needed (insomnia).     metoprolol tartrate (LOPRESSOR) 50 MG tablet Take 1 tablet (50 mg total) by mouth 2 (two) times daily.     ondansetron (ZOFRAN) 4 MG tablet Take 1 tablet (4 mg total) by mouth every 6 (six) hours. 12 tablet 0   [DISCONTINUED] glipiZIDE (GLUCOTROL XL) 5 MG 24 hr tablet Take 1 tablet (5 mg total) by mouth daily with breakfast. (Patient not taking: No sig reported) 90 tablet 3   No current facility-administered medications on file prior to visit.     There were no vitals taken for this visit.  Physical Exam  CTA Results:  Narrative & Impression  CLINICAL DATA:  Aortic dissection, follow-up repair   EXAM: CT CHEST WITHOUT CONTRAST   TECHNIQUE: Multidetector CT imaging of the chest was performed following the standard protocol without IV contrast.   RADIATION DOSE REDUCTION: This exam was performed according to  the departmental dose-optimization program which includes automated exposure control, adjustment of the mA and/or kV according to patient size and/or use of iterative reconstruction technique.   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 The remaining native portion of the distal tubular ascending thoracic aorta and proximal aortic arch remains dilated at 4.1 x 4.1 cm, unchanged. Descending thoracic aorta measures 3.9 x 3.6 cm unchanged. Cardiomegaly. Left and right coronary artery calcifications. No pericardial effusion.   Mediastinum/Nodes: No enlarged mediastinal, hilar, or axillary lymph nodes. Thyroid gland, trachea, and esophagus demonstrate no significant findings.    Lungs/Pleura: Unchanged, near complete chronic atelectasis of the right middle lobe. No pleural effusion or pneumothorax.   Upper Abdomen: No acute abnormality. Unchanged, definitively benign bilateral macroscopic fat containing adrenal adenomata, for which no further follow-up or characterization is required. Unchanged bilateral renal lesions of varying attenuation, including hyperdense lesions of the bilateral upper poles, incompletely characterized by this noncontrast examination of the chest but presumably benign hemorrhagic or proteinaceous cysts.   Musculoskeletal: No chest wall abnormality. No acute osseous findings.   IMPRESSION: 1. 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 The remaining native portion of the distal tubular ascending thoracic aorta and proximal aortic arch remains dilated at 4.1 x 4.1 cm, unchanged. Descending thoracic aorta measures 3.9 x 3.6 cm, unchanged. 2. Unchanged, near complete chronic atelectasis of the right middle lobe. 3. Cardiomegaly and coronary artery disease.   Aortic Atherosclerosis (ICD10-I70.0).     Electronically Signed   By: Delanna Ahmadi M.D.   On: 11/15/2022 13:21        A/P:  S/p Emergent Aortic Dissection Repair in 2021- CT scan shows stable repair and appearance of native aorta HTN-continue Norvasc, Lopressor RTC in 1 year with repeat CT Chest    Risk Modification:  Statin:  Yes  Patient was counseled on importance of Blood Pressure Control.  Despite Medical intervention if the patient notices persistently elevated blood pressure readings.  They are instructed to contact their Primary Care Physician  Please avoid use of Fluoroquinolones as this can potentially increase your risk of Aortic Rupture and/or Dissection   [name], PA-C 11/17/22

## 2022-11-18 ENCOUNTER — Ambulatory Visit (INDEPENDENT_AMBULATORY_CARE_PROVIDER_SITE_OTHER): Payer: Medicare (Managed Care) | Admitting: Physician Assistant

## 2022-11-18 VITALS — BP 140/86 | HR 87 | Resp 20 | Ht 68.0 in | Wt 136.0 lb

## 2022-11-18 DIAGNOSIS — Z95828 Presence of other vascular implants and grafts: Secondary | ICD-10-CM

## 2022-11-18 DIAGNOSIS — I7101 Dissection of ascending aorta: Secondary | ICD-10-CM | POA: Diagnosis not present

## 2022-11-18 NOTE — Progress Notes (Signed)
[address]       [place] [address]             [phone]        CHELISE HANGER [id number] 1956-02-15   History of Present Illness: Mary Stephens is a 66 yo female with a past medical history of HTN, HLD, T2DM, aortic dissection, tobacco abuse, and chronic Hepatitis C. She is S/P Repair of Aortic Dissection by Dr. Kipp Brood in December 2021. 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 She presents today for yearly surveillance. She admits to minor "twinges" of chest pain every once in a while. She denies chest tightness, dyspnea, dizziness, LOC.    Past Medical History:  Diagnosis Date   Anxiety    Arthritis    Cataract    forming- very small   Colon polyps    Depression    Diabetes mellitus without complication ([place])    off meds for diabetes- diet controlled   GERD (gastroesophageal reflux disease)    H/O aortic dissection 2021   Hepatitis C    Hyperlipidemia    Hypertension    Neuropathy    feet    Current Outpatient Medications on File Prior to Visit  Medication Sig Dispense Refill   acetaminophen (TYLENOL) 500 MG tablet Take 1 tablet (500 mg total) by mouth every 6 (six) hours as needed for headache. 30 tablet 0   amLODipine (NORVASC) 10 MG tablet Take 1 tablet (10 mg total) by mouth every morning. (Patient taking differently: Take 10 mg by mouth daily.) 90 tablet 3   Artificial Saliva (DRY MOUTH SPRAY MT) Use as directed 1 spray in the mouth or throat 6 (six) times daily. Biotene     aspirin EC 325 MG EC tablet Take 1 tablet (325 mg total) by mouth daily. 30 tablet 0   atorvastatin (LIPITOR) 10 MG tablet Take 1 tablet (10 mg total) by mouth daily. 90 tablet 3   calcium carbonate (OSCAL) 1500 (600 Ca) MG TABS tablet Take 600 mg of elemental calcium by mouth daily with breakfast.      Cholecalciferol (VITAMIN D-3) 125 MCG (5000 UT) TABS Take 5,000 Units by mouth daily.     cloNIDine (CATAPRES) 0.2 MG tablet Take 1 tablet (0.2 mg total) by mouth 2 (two) times daily. (Patient not taking: Reported on 03/25/2022) 60 tablet 3   diclofenac Sodium (VOLTAREN) 1 % GEL Apply 2-4 g topically 4 (four) times daily as needed (for bilateral foot pain).     dronabinol (MARINOL) 2.5 MG capsule Take 1 capsule (2.5 mg total) by mouth 2 (two) times daily before lunch and supper. 60 capsule 0   gabapentin (NEURONTIN) 300 MG capsule Take 300 mg by mouth 2 (two) times daily.     Glucerna (GLUCERNA) LIQD Take 237 mLs by mouth 2 (two) times daily between meals. (Patient not taking: Reported on 03/25/2022)     glucose blood test strip Check BS twice daily, 250.00 100 each 12   glucose monitoring kit (FREESTYLE) monitoring kit 1 each by Does not apply route as needed for other. Please check BS twice daily before meals, 250.00 1 each 0   Lancets (FREESTYLE) lancets Check BS twice daily before meals, 250.00 100 each 12   melatonin 3 MG TABS tablet Take 3 mg by mouth at bedtime  as needed (insomnia).     metoprolol tartrate (LOPRESSOR) 50 MG tablet Take 1 tablet (50 mg total) by mouth 2 (two) times daily.     ondansetron (ZOFRAN) 4 MG tablet Take 1 tablet (4 mg total) by mouth every 6 (six) hours. 12 tablet 0   [DISCONTINUED] glipiZIDE (GLUCOTROL XL) 5 MG 24 hr tablet Take 1 tablet (5 mg total) by mouth daily with breakfast. (Patient not taking: No sig reported) 90 tablet 3   No current facility-administered medications on file prior to visit.   Vitals: Vitals:   11/18/22 1340  BP: (!) 140/86  Pulse: 87  Resp: 20  SpO2: 99%    Physical Exam General: Alert and oriented Neuro: Grossly intact Neck: No carotid bruits CV: Regular rate and rhythm, no murmur Pulm: Diminished right sided breath sounds, otherwise clear to auscultation GI: No distension, nontender Extremities: No edema  CTA  Results: CLINICAL DATA:  Aortic dissection, follow-up repair   EXAM: CT CHEST WITHOUT CONTRAST   TECHNIQUE: Multidetector CT imaging of the chest was performed following the standard protocol without IV contrast.   RADIATION DOSE REDUCTION: This exam was performed according to the departmental dose-optimization program which includes automated exposure control, adjustment of the mA and/or kV according to patient size and/or use of iterative reconstruction technique.   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 The remaining native portion of the distal tubular ascending thoracic aorta and proximal aortic arch remains dilated at 4.1 x 4.1 cm, unchanged. Descending thoracic aorta measures 3.9 x 3.6 cm unchanged. Cardiomegaly. Left and right coronary artery calcifications. No pericardial effusion.   Mediastinum/Nodes: No enlarged mediastinal, hilar, or axillary lymph nodes. Thyroid gland, trachea, and esophagus demonstrate no significant findings.   Lungs/Pleura: Unchanged, near complete chronic atelectasis of the right middle lobe. No pleural effusion or pneumothorax.   Upper Abdomen: No acute abnormality. Unchanged, definitively benign bilateral macroscopic fat containing adrenal adenomata, for which no further follow-up or characterization is required. Unchanged bilateral renal lesions of varying attenuation, including hyperdense lesions of the bilateral upper poles, incompletely characterized by this noncontrast examination of the chest but presumably benign hemorrhagic or proteinaceous cysts.   Musculoskeletal: No chest wall abnormality. No acute osseous findings.   IMPRESSION: 1. 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 The remaining native portion of the distal tubular ascending thoracic aorta and proximal  aortic arch remains dilated at 4.1 x 4.1 cm, unchanged. Descending thoracic aorta measures 3.9 x 3.6 cm, unchanged. 2. Unchanged, near complete chronic atelectasis of the right middle lobe. 3. Cardiomegaly and coronary artery disease.   Aortic Atherosclerosis (ICD10-I70.0).     Electronically Signed   By: Delanna Ahmadi M.D.   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 Ascending aortic aneurysm does not require surgical intervention at this time. Will continue annual surveillance with CTA.  HTN: Was on Norvasc 18m QD, Clonidine 0.282mQD and lopressor 5061mID. Did not like how clonidine made her feel. Now on Amlodipine 22m29m, Hydralazine 25 mg TID and metoprolol 100mg61m. She states her blood pressure is better controlled today than it has been and she follows up closely with her primary care. Counseled on importance of good blood pressure control and continued follow up with PCP. Will not make any medication changes today. HLD: On atorvastatin 22mg78m  QD T2DM: Diet controlled, no longer on medication   Reviewed Risk Modification:  Statin:  Atorvastatin 77m QD  Smoking cessation instruction/counseling given:  counseled patient on the dangers of tobacco use, advised patient to stop smoking, and reviewed strategies to maximize success  Patient was counseled on importance of Blood Pressure Control.  Despite Medical intervention if the patient notices persistently elevated blood pressure readings.  They are instructed to contact their Primary Care Physician  Please avoid use of Fluoroquinolones as this can potentially increase your risk of Aortic Rupture and/or Dissection  Exercise and activity limitations is individualized, but in general, contact sports are to be avoided and one should avoid heavy lifting (defined as half of ideal body weight) and exercises involving sustained Valsalva maneuver.  Patient educated on  signs and symptoms of Aortic Dissection, handout also provided in AVS   BMagdalene River PA-C 11/18/22

## 2022-11-18 NOTE — Patient Instructions (Signed)

## 2022-12-13 ENCOUNTER — Encounter (HOSPITAL_COMMUNITY): Payer: Self-pay

## 2022-12-13 ENCOUNTER — Emergency Department (HOSPITAL_COMMUNITY)
Admission: EM | Admit: 2022-12-13 | Discharge: 2022-12-13 | Disposition: A | Discharge status: Home / Self Care | Payer: Medicare (Managed Care) | Attending: Emergency Medicine | Admitting: Emergency Medicine

## 2022-12-13 ENCOUNTER — Other Ambulatory Visit: Payer: Self-pay

## 2022-12-13 ENCOUNTER — Emergency Department (HOSPITAL_COMMUNITY): Payer: Medicare (Managed Care)

## 2022-12-13 DIAGNOSIS — M79641 Pain in right hand: Secondary | ICD-10-CM | POA: Diagnosis present

## 2022-12-13 DIAGNOSIS — I10 Essential (primary) hypertension: Secondary | ICD-10-CM | POA: Insufficient documentation

## 2022-12-13 DIAGNOSIS — E119 Type 2 diabetes mellitus without complications: Secondary | ICD-10-CM | POA: Insufficient documentation

## 2022-12-13 DIAGNOSIS — W228XXA Striking against or struck by other objects, initial encounter: Secondary | ICD-10-CM | POA: Diagnosis not present

## 2022-12-13 DIAGNOSIS — Z7984 Long term (current) use of oral hypoglycemic drugs: Secondary | ICD-10-CM | POA: Diagnosis not present

## 2022-12-13 DIAGNOSIS — Z79899 Other long term (current) drug therapy: Secondary | ICD-10-CM | POA: Diagnosis not present

## 2022-12-13 DIAGNOSIS — S63501A Unspecified sprain of right wrist, initial encounter: Secondary | ICD-10-CM

## 2022-12-13 DIAGNOSIS — Y93G3 Activity, cooking and baking: Secondary | ICD-10-CM | POA: Diagnosis not present

## 2022-12-13 DIAGNOSIS — Z7982 Long term (current) use of aspirin: Secondary | ICD-10-CM | POA: Diagnosis not present

## 2022-12-13 MED ORDER — HYDROCODONE-ACETAMINOPHEN 5-325 MG PO TABS: 2.0000 | ORAL_TABLET | ORAL | 0 refills | Status: DC | PRN

## 2022-12-13 MED ORDER — HYDROCODONE-ACETAMINOPHEN 5-325 MG PO TABS
1.0000 | ORAL_TABLET | Freq: Once | ORAL | Status: AC
  Administered 2022-12-13: 1 via ORAL
  Filled 2022-12-13: qty 1

## 2022-12-13 NOTE — ED Provider Triage Note (Signed)
Emergency Medicine Provider Triage Evaluation Note  Mary Stephens , a 68 y.o. female  was evaluated in triage.  Pt complains of right hand and wrist pain after hitting her arm on the counter accidentally while cooking today.  Review of Systems  Positive: Right wrist and palm of the hand pain Negative: Numbness or tingling  Physical Exam  BP (!) 150/99 (BP Location: Left Arm)   Pulse 67   Temp 98.5 F (36.9 C) (Oral)   Resp 20   SpO2 100%  Gen:   Awake, comfortable Resp:  Normal effort  MSK:   Patient cannot move her right wrist.  Radial pulses 2+, sensation is intact in the right upper extremity.  No tenderness of the forearm or elbow. Other:    Medical Decision Making  Medically screening exam initiated at 12:19 PM.  Appropriate orders placed.  Mary Stephens was informed that the remainder of the evaluation will be completed by another provider, this initial triage assessment does not replace that evaluation, and the importance of remaining in the ED until their evaluation is complete.     Sherrye Payor A, [place] 12/13/22 1222

## 2022-12-13 NOTE — ED Provider Notes (Signed)
[facility] DEPT Provider Note   CSN: [id number] Arrival date & time: 12/13/22  1202     History  No chief complaint on file.   Mary Stephens is a 67 y.o. female.  HPI   Patient with medical Struve hypertension, hyperlipidemia, diabetes, neuropathy presents to the emergency department due to right hand pain.  She is right-hand dominant.  This happened while she was cooking, states she ended up hitting the counter with the dorsum of her right hand and is having pain to that area as well as the right wrist.  This can discoloration or lacerations, she has sensation.  Pain is worse with any movement.  She is concerned she broke it.  Home Medications Prior to Admission medications   Medication Sig Start Date End Date Taking? Authorizing Provider  acetaminophen (TYLENOL) 500 MG tablet Take 1 tablet (500 mg total) by mouth every 6 (six) hours as needed for headache. 12/10/20   Barrett, Erin R, PA-C  amLODipine (NORVASC) 10 MG tablet Take 1 tablet (10 mg total) by mouth every morning. Patient taking differently: Take 10 mg by mouth daily. 01/27/18   Gildardo Pounds, NP  Artificial Saliva (DRY MOUTH SPRAY MT) Use as directed 1 spray in the mouth or throat 6 (six) times daily. Biotene    [provider]  aspirin EC 325 MG EC tablet Take 1 tablet (325 mg total) by mouth daily. 12/10/20   Barrett, Erin R, PA-C  atorvastatin (LIPITOR) 10 MG tablet Take 1 tablet (10 mg total) by mouth daily. 01/27/18   Gildardo Pounds, NP  calcium carbonate (OSCAL) 1500 (600 Ca) MG TABS tablet Take 600 mg of elemental calcium by mouth daily with breakfast.    [provider]  Cholecalciferol (VITAMIN D-3) 125 MCG (5000 UT) TABS Take 5,000 Units by mouth daily.    [provider]  cloNIDine (CATAPRES) 0.2 MG tablet Take 1 tablet (0.2 mg total) by mouth 2 (two) times daily. Patient not taking: Reported on 03/25/2022 12/12/20   Nani Skillern, PA-C   diclofenac Sodium (VOLTAREN) 1 % GEL Apply 2-4 g topically 4 (four) times daily as needed (for bilateral foot pain).    [provider]  dronabinol (MARINOL) 2.5 MG capsule Take 1 capsule (2.5 mg total) by mouth 2 (two) times daily before lunch and supper. 01/16/21   Lightfoot, Lucile Crater, MD  gabapentin (NEURONTIN) 300 MG capsule Take 300 mg by mouth 2 (two) times daily.    [provider]  Glucerna (GLUCERNA) LIQD Take 237 mLs by mouth 2 (two) times daily between meals.    [provider]  glucose blood test strip Check BS twice daily, 250.00 09/12/14   Lance Bosch, NP  glucose monitoring kit (FREESTYLE) monitoring kit 1 each by Does not apply route as needed for other. Please check BS twice daily before meals, 250.00 09/12/14   Lance Bosch, NP  hydrALAZINE (APRESOLINE) 25 MG tablet Take 25 mg by mouth 3 (three) times daily.    [provider]  Lancets (FREESTYLE) lancets Check BS twice daily before meals, 250.00 09/12/14   Chari Manning A, NP  melatonin 3 MG TABS tablet Take 3 mg by mouth at bedtime as needed (insomnia).    [provider]  metoprolol tartrate (LOPRESSOR) 50 MG tablet Take 1 tablet (50 mg total) by mouth 2 (two) times daily. 12/10/20   Barrett, Erin R, PA-C  ondansetron (ZOFRAN) 4 MG tablet Take 1 tablet (4  mg total) by mouth every 6 (six) hours. 01/10/22   Larene Pickett, PA-C  glipiZIDE (GLUCOTROL XL) 5 MG 24 hr tablet Take 1 tablet (5 mg total) by mouth daily with breakfast. Patient not taking: Reported on 06/20/2020 01/27/18 06/20/20  Gildardo Pounds, NP      Allergies    Patient has no known allergies.    Review of Systems   Review of Systems  Physical Exam Updated Vital Signs BP (!) 150/99 (BP Location: Left Arm)   Pulse 67   Temp 98.5 F (36.9 C) (Oral)   Resp 20   SpO2 100%  Physical Exam Vitals and nursing note reviewed. Exam conducted with a chaperone present.  Constitutional:      General: She is not in acute  distress.    Appearance: Normal appearance.  HENT:     Head: Normocephalic and atraumatic.  Eyes:     General: No scleral icterus.    Extraocular Movements: Extraocular movements intact.     Pupils: Pupils are equal, round, and reactive to light.  Cardiovascular:     Pulses: Normal pulses.  Musculoskeletal:        General: Tenderness present.     Comments: Tenderness to some of wrist, decreased ROM secondary to pain.    Skin:    Capillary Refill: Capillary refill takes less than 2 seconds.     Coloration: Skin is not jaundiced.  Neurological:     Mental Status: She is alert. Mental status is at baseline.     Coordination: Coordination normal.     ED Results / Procedures / Treatments   Labs (all labs ordered are listed, but only abnormal results are displayed) Labs Reviewed - No data to display  EKG None  Radiology No results found.  Procedures Procedures    Medications Ordered in ED Medications  HYDROcodone-acetaminophen (NORCO/VICODIN) 5-325 MG per tablet 1 tablet (has no administration in time range)    ED Course/ Medical Decision Making/ A&P                           Medical Decision Making Risk Prescription drug management.   Patient presents due to right hand pain after a fall.  Neurovascular intact on exam with radial pulse 2+, cap refill less than 2.  Sensation is intact circumferentially.  Limited ROM most likely secondary to pain, no rigidity, crepitus or obvious contusions.  No lacerations to the skin or discoloration.  Will start with plain films of wrist and hand and then reevaluate.  Pain medicine was ordered in triage.    Plain films are negative for any acute fractures dislocations.  I agree with radiologist.  On reevaluation patient pain improved somewhat.  Will also order wrist splint for support.  Considered admission but do not think indicated.  Suspect pain secondary to sprain, will have her follow-up with hand doctor and outpatient primary  care provider.  Return precautions gust with patient who verbalized understanding and agreement with the plan.  Discussed HPI, physical exam and plan of care for this patient with attending Dene Gentry. The attending physician evaluated this patient as part of a shared visit and agrees with plan of care.         Final Clinical Impression(s) / ED Diagnoses Final diagnoses:  None    Rx / DC Orders ED Discharge Orders     None         Sherrill Raring, [place] 12/13/22 1422  Valarie Merino, MD 12/13/22 1430

## 2022-12-13 NOTE — ED Notes (Signed)
Patient transported to X-ray 

## 2022-12-13 NOTE — ED Triage Notes (Signed)
Pt BIB EMS from home. Pt c/o right hand injury while cooking today. EMS reports no obvious deformity, bilateral radial pulses, good cap refill.  132/70 80 HR

## 2022-12-13 NOTE — Discharge Instructions (Addendum)
You are seen today in the emergency department for wrist/hand pain.  Your x-rays were reassuringly normal.  Suspect you may have a sprain, this should improve with time.  Ice, elevate.  Can take the pain medicine I sent to the pharmacy every 6 hours as needed for the first 2 days.  This help with the acute inflammatory phase.  After that and start taking Tylenol for pain.  Follow-up with your main doctor next week for reevaluation.  If the hand pain persist you may need to follow-up with a hand doctor, information above to establish care.

## 2023-05-05 ENCOUNTER — Inpatient Hospital Stay (HOSPITAL_COMMUNITY)
Admission: EM | Admit: 2023-05-05 | Discharge: 2023-05-08 | DRG: 684 | Disposition: A | Discharge status: Home / Self Care | Payer: Medicare (Managed Care) | Attending: Internal Medicine | Admitting: Internal Medicine

## 2023-05-05 DIAGNOSIS — E1140 Type 2 diabetes mellitus with diabetic neuropathy, unspecified: Secondary | ICD-10-CM | POA: Diagnosis present

## 2023-05-05 DIAGNOSIS — Z79899 Other long term (current) drug therapy: Secondary | ICD-10-CM

## 2023-05-05 DIAGNOSIS — K219 Gastro-esophageal reflux disease without esophagitis: Secondary | ICD-10-CM | POA: Diagnosis present

## 2023-05-05 DIAGNOSIS — F17210 Nicotine dependence, cigarettes, uncomplicated: Secondary | ICD-10-CM | POA: Diagnosis present

## 2023-05-05 DIAGNOSIS — I129 Hypertensive chronic kidney disease with stage 1 through stage 4 chronic kidney disease, or unspecified chronic kidney disease: Secondary | ICD-10-CM | POA: Diagnosis present

## 2023-05-05 DIAGNOSIS — E876 Hypokalemia: Secondary | ICD-10-CM

## 2023-05-05 DIAGNOSIS — Z91199 Patient's noncompliance with other medical treatment and regimen due to unspecified reason: Secondary | ICD-10-CM

## 2023-05-05 DIAGNOSIS — I10 Essential (primary) hypertension: Secondary | ICD-10-CM | POA: Diagnosis present

## 2023-05-05 DIAGNOSIS — E785 Hyperlipidemia, unspecified: Secondary | ICD-10-CM | POA: Insufficient documentation

## 2023-05-05 DIAGNOSIS — N184 Chronic kidney disease, stage 4 (severe): Secondary | ICD-10-CM | POA: Insufficient documentation

## 2023-05-05 DIAGNOSIS — Z7982 Long term (current) use of aspirin: Secondary | ICD-10-CM

## 2023-05-05 DIAGNOSIS — N179 Acute kidney failure, unspecified: Principal | ICD-10-CM | POA: Diagnosis present

## 2023-05-05 DIAGNOSIS — Z833 Family history of diabetes mellitus: Secondary | ICD-10-CM

## 2023-05-05 DIAGNOSIS — E119 Type 2 diabetes mellitus without complications: Secondary | ICD-10-CM

## 2023-05-05 DIAGNOSIS — Z95828 Presence of other vascular implants and grafts: Secondary | ICD-10-CM

## 2023-05-05 DIAGNOSIS — Z7984 Long term (current) use of oral hypoglycemic drugs: Secondary | ICD-10-CM

## 2023-05-05 DIAGNOSIS — E1122 Type 2 diabetes mellitus with diabetic chronic kidney disease: Secondary | ICD-10-CM | POA: Diagnosis present

## 2023-05-05 LAB — URINALYSIS, ROUTINE W REFLEX MICROSCOPIC
Bilirubin Urine: NEGATIVE
Glucose, UA: NEGATIVE mg/dL
Hgb urine dipstick: NEGATIVE
Ketones, ur: NEGATIVE mg/dL
Leukocytes,Ua: NEGATIVE
Nitrite: NEGATIVE
Protein, ur: 100 mg/dL — AB
Specific Gravity, Urine: 1.012 (ref 1.005–1.030)
pH: 5 (ref 5.0–8.0)

## 2023-05-05 LAB — COMPREHENSIVE METABOLIC PANEL
ALT: 9 U/L (ref 0–44)
AST: 15 U/L (ref 15–41)
Albumin: 3.4 g/dL — ABNORMAL LOW (ref 3.5–5.0)
Alkaline Phosphatase: 54 U/L (ref 38–126)
Anion gap: 11 (ref 5–15)
BUN: 50 mg/dL — ABNORMAL HIGH (ref 8–23)
CO2: 24 mmol/L (ref 22–32)
Calcium: 8.6 mg/dL — ABNORMAL LOW (ref 8.9–10.3)
Chloride: 107 mmol/L (ref 98–111)
Creatinine, Ser: 4.25 mg/dL — ABNORMAL HIGH (ref 0.44–1.00)
GFR, Estimated: 11 mL/min — ABNORMAL LOW (ref >60)
Glucose, Bld: 100 mg/dL — ABNORMAL HIGH (ref 70–99)
Potassium: 3.1 mmol/L — ABNORMAL LOW (ref 3.5–5.1)
Sodium: 142 mmol/L (ref 135–145)
Total Bilirubin: 0.7 mg/dL (ref 0.3–1.2)
Total Protein: 6.7 g/dL (ref 6.5–8.1)

## 2023-05-05 LAB — CBC
HCT: 36.6 % (ref 36.0–46.0)
Hemoglobin: 12.1 g/dL (ref 12.0–15.0)
MCH: 32.6 pg (ref 26.0–34.0)
MCHC: 33.1 g/dL (ref 30.0–36.0)
MCV: 98.7 fL (ref 80.0–100.0)
Platelets: 192 10*3/uL (ref 150–400)
RBC: 3.71 MIL/uL — ABNORMAL LOW (ref 3.87–5.11)
RDW: 12.8 % (ref 11.5–15.5)
WBC: 4.2 10*3/uL (ref 4.0–10.5)
nRBC: 0 % (ref 0.0–0.2)

## 2023-05-05 LAB — LIPASE, BLOOD: Lipase: 30 U/L (ref 11–51)

## 2023-05-05 MED ORDER — POTASSIUM CHLORIDE CRYS ER 20 MEQ PO TBCR
40.0000 meq | EXTENDED_RELEASE_TABLET | Freq: Once | ORAL | Status: AC
  Administered 2023-05-05: 40 meq via ORAL
  Filled 2023-05-05: qty 2

## 2023-05-05 MED ORDER — LACTATED RINGERS IV BOLUS
1000.0000 mL | Freq: Once | INTRAVENOUS | Status: AC
  Administered 2023-05-05: 1000 mL via INTRAVENOUS

## 2023-05-05 MED ORDER — LACTATED RINGERS IV BOLUS
1000.0000 mL | Freq: Once | INTRAVENOUS | Status: AC
  Administered 2023-05-06: 1000 mL via INTRAVENOUS

## 2023-05-05 NOTE — ED Provider Notes (Signed)
[place] EMERGENCY DEPARTMENT AT [facility] Provider Note   CSN: [id number] Arrival date & time: 05/05/23  1628     History  Chief Complaint  Patient presents with   Abnormal Lab    Mary Stephens is a 67 y.o. female.  Patient is a 67 year old female with a past medical history of hypertension, diabetes, CKD presenting to the emergency department for abnormal labs.  The patient states that she was seen by her primary doctor and had routine labs performed and was called today telling her that her lab work was abnormal and to come to the emergency department.  She states that she has been feeling well recently without any nausea or vomiting.  She states that she has had a few episodes of diarrhea the last couple of days.  She denies any recent medication changes.  She states that she has been urinating normally.  The history is provided by the patient.  Abnormal Lab      Home Medications Prior to Admission medications   Medication Sig Start Date End Date Taking? Authorizing Provider  amLODipine (NORVASC) 10 MG tablet Take 1 tablet (10 mg total) by mouth every morning. Patient taking differently: Take 10 mg by mouth daily. 01/27/18   Claiborne Rigg, NP  Artificial Saliva (DRY MOUTH SPRAY MT) Use as directed 1 spray in the mouth or throat 6 (six) times daily. Biotene    [provider]  aspirin EC 325 MG EC tablet Take 1 tablet (325 mg total) by mouth daily. 12/10/20   Barrett, Erin R, PA-C  atorvastatin (LIPITOR) 10 MG tablet Take 1 tablet (10 mg total) by mouth daily. 01/27/18   Claiborne Rigg, NP  calcium carbonate (OSCAL) 1500 (600 Ca) MG TABS tablet Take 600 mg of elemental calcium by mouth daily with breakfast.    [provider]  Cholecalciferol (VITAMIN D-3) 125 MCG (5000 UT) TABS Take 5,000 Units by mouth daily.    [provider]  cloNIDine (CATAPRES) 0.2 MG tablet Take 1 tablet (0.2 mg total) by mouth 2 (two) times daily. Patient  not taking: Reported on 03/25/2022 12/12/20   Ardelle Balls, PA-C  diclofenac Sodium (VOLTAREN) 1 % GEL Apply 2-4 g topically 4 (four) times daily as needed (for bilateral foot pain).    [provider]  dronabinol (MARINOL) 2.5 MG capsule Take 1 capsule (2.5 mg total) by mouth 2 (two) times daily before lunch and supper. 01/16/21   Lightfoot, Eliezer Lofts, MD  gabapentin (NEURONTIN) 300 MG capsule Take 300 mg by mouth 2 (two) times daily.    [provider]  Glucerna (GLUCERNA) LIQD Take 237 mLs by mouth 2 (two) times daily between meals.    [provider]  glucose blood test strip Check BS twice daily, 250.00 09/12/14   Ambrose Finland, NP  glucose monitoring kit (FREESTYLE) monitoring kit 1 each by Does not apply route as needed for other. Please check BS twice daily before meals, 250.00 09/12/14   Ambrose Finland, NP  hydrALAZINE (APRESOLINE) 25 MG tablet Take 25 mg by mouth 3 (three) times daily.    [provider]  HYDROcodone-acetaminophen (NORCO/VICODIN) 5-325 MG tablet Take 2 tablets by mouth every 4 (four) hours as needed. 12/13/22   Theron Arista, PA-C  Lancets (FREESTYLE) lancets Check BS twice daily before meals, 250.00 09/12/14   Holland Commons A, NP  melatonin 3 MG TABS tablet Take 3 mg by mouth at bedtime as needed (insomnia).  [provider]  metoprolol tartrate (LOPRESSOR) 50 MG tablet Take 1 tablet (50 mg total) by mouth 2 (two) times daily. 12/10/20   Barrett, Erin R, PA-C  ondansetron (ZOFRAN) 4 MG tablet Take 1 tablet (4 mg total) by mouth every 6 (six) hours. 01/10/22   Garlon Hatchet, PA-C  glipiZIDE (GLUCOTROL XL) 5 MG 24 hr tablet Take 1 tablet (5 mg total) by mouth daily with breakfast. Patient not taking: Reported on 06/20/2020 01/27/18 06/20/20  Claiborne Rigg, NP      Allergies    Patient has no known allergies.    Review of Systems   Review of Systems  Physical Exam Updated Vital Signs BP (!) 176/103 (BP Location: Right  Arm)   Pulse (!) 59   Temp (!) 97.3 F (36.3 C) (Oral)   Resp 16   SpO2 98%  Physical Exam Vitals and nursing note reviewed.  Constitutional:      General: She is not in acute distress.    Appearance: Normal appearance.  HENT:     Head: Normocephalic and atraumatic.     Nose: Nose normal.     Mouth/Throat:     Mouth: Mucous membranes are moist.     Pharynx: Oropharynx is clear.  Eyes:     Extraocular Movements: Extraocular movements intact.     Conjunctiva/sclera: Conjunctivae normal.  Cardiovascular:     Rate and Rhythm: Normal rate and regular rhythm.     Heart sounds: Normal heart sounds.  Pulmonary:     Effort: Pulmonary effort is normal.     Breath sounds: Normal breath sounds.  Abdominal:     General: Abdomen is flat.     Palpations: Abdomen is soft.     Tenderness: There is no abdominal tenderness.  Musculoskeletal:        General: Normal range of motion.     Cervical back: Normal range of motion and neck supple.  Skin:    General: Skin is warm and dry.  Neurological:     General: No focal deficit present.     Mental Status: She is alert and oriented to person, place, and time.  Psychiatric:        Mood and Affect: Mood normal.        Behavior: Behavior normal.     ED Results / Procedures / Treatments   Labs (all labs ordered are listed, but only abnormal results are displayed) Labs Reviewed  COMPREHENSIVE METABOLIC PANEL - Abnormal; Notable for the following components:      Result Value   Potassium 3.1 (*)    Glucose, Bld 100 (*)    BUN 50 (*)    Creatinine, Ser 4.25 (*)    Calcium 8.6 (*)    Albumin 3.4 (*)    GFR, Estimated 11 (*)    All other components within normal limits  CBC - Abnormal; Notable for the following components:   RBC 3.71 (*)    All other components within normal limits  URINALYSIS, ROUTINE W REFLEX MICROSCOPIC - Abnormal; Notable for the following components:   Protein, ur 100 (*)    Bacteria, UA RARE (*)    All other  components within normal limits  LIPASE, BLOOD    EKG None  Radiology No results found.  Procedures Procedures    Medications Ordered in ED Medications  lactated ringers bolus 1,000 mL (has no administration in time range)  potassium chloride SA (KLOR-CON M) CR tablet 40 mEq (40 mEq Oral Given 05/05/23  2213)  lactated ringers bolus 1,000 mL (1,000 mLs Intravenous New Bag/Given 05/05/23 2218)    ED Course/ Medical Decision Making/ A&P                             Medical Decision Making This patient presents to the ED with chief complaint(s) of abnormal labs with pertinent past medical history of HTN, DM, CKD which further complicates the presenting complaint. The complaint involves an extensive differential diagnosis and also carries with it a high risk of complications and morbidity.    The differential diagnosis includes AKI on CKD, acute renal failure, dehydration, electrolyte abnormality  Additional history obtained: Additional history obtained from N/A Records reviewed Primary Care Documents  ED Course and Reassessment: Patient was initially evaluated by triage and had labs and urine performed.  Labs here did show creatinine of 4.2 from her baseline around three 1 year ago.  The patient did bring lab work from her primary doctor and creatinine was similar around 4 on the outpatient labs with her most recent sent creatinines between 2.8-2.6 within the last year.  Patient's labs otherwise at her baseline.  Patient will be started on IV fluids and will be admitted for further management of her AKI on CKD.  Independent labs interpretation:  The following labs were independently interpreted: Creatinine 4.2 from baseline around 3  Independent visualization of imaging: - N/A  Consultation: - Consulted or discussed management/test interpretation w/ external professional: hospitalist  Consideration for admission or further workup: patient requires admission for AKI on  CKD Social Determinants of health: N/A    Amount and/or Complexity of Data Reviewed Labs: ordered.  Risk Prescription drug management. Decision regarding hospitalization.          Final Clinical Impression(s) / ED Diagnoses Final diagnoses:  AKI (acute kidney injury) [facility])    Rx / DC Orders ED Discharge Orders     None         Rexford Maus, DO 05/05/23 2223

## 2023-05-05 NOTE — ED Notes (Signed)
ED TO INPATIENT HANDOFF REPORT  ED Nurse Name and Phone #: Amil Amen [phone]  S Name/Age/Gender Mary Stephens 67 y.o. female Room/Bed: 038C/038C  Code Status   Code Status: Prior  Home/SNF/Other Home Patient oriented to: self, place, time, and situation Is this baseline? Yes   Triage Complete: Triage complete  Chief Complaint AKI (acute kidney injury) [facility]) [N17.9]  Triage Note Pt referred to ED by PCP after having abnormal renal lab workup yesterday. Pt denies somatic complaints in triage. No changes in urination. No pain per pt.    Allergies No Known Allergies  Level of Care/Admitting Diagnosis ED Disposition     ED Disposition  Admit   Condition  --   Comment  Hospital Area: [facility] [[id number]]  Level of Care: Telemetry Medical [104]  May place patient in observation at [facility] or [facility] if equivalent level of care is available:: No  Covid Evaluation: Asymptomatic - no recent exposure (last 10 days) testing not required  Diagnosis: AKI (acute kidney injury) [facility]) [[id number]]  Admitting Physician: Eduard Clos [phone]  Attending Physician: Eduard Clos Florian.Pax          B Medical/Surgery History Past Medical History:  Diagnosis Date   Anxiety    Arthritis    Cataract    forming- very small   Colon polyps    Depression    Diabetes mellitus without complication (HCC)    off meds for diabetes- diet controlled   GERD (gastroesophageal reflux disease)    H/O aortic dissection 2021   Hepatitis C    Hyperlipidemia    Hypertension    Neuropathy    feet   Past Surgical History:  Procedure Laterality Date   COLONOSCOPY  ?2001,2011?   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 (N/A)  12/02/2020   TEE WITHOUT CARDIOVERSION  12/02/2020   Procedure: TRANSESOPHAGEAL ECHOCARDIOGRAM (TEE);  Surgeon: Corliss Skains, MD;  Location: [facility] OR;  Service: Vascular;;     A IV Location/Drains/Wounds Patient Lines/Drains/Airways Status     Active Line/Drains/Airways     Name Placement date Placement time Site Days   Peripheral IV 05/05/23 20 G Right Antecubital 05/05/23  2213  Antecubital  less than 1            Intake/Output Last 24 hours No intake or output data in the 24 hours ending 05/05/23 2334  Labs/Imaging Results for orders placed or performed during the hospital encounter of 05/05/23 (from the past 48 hour(s))  Urinalysis, Routine w reflex microscopic -Urine, Clean Catch     Status: Abnormal   Collection Time: 05/05/23  5:03 PM  Result Value Ref Range   Color, Urine YELLOW YELLOW   APPearance CLEAR CLEAR   Specific Gravity, Urine 1.012 1.005 - 1.030   pH 5.0 5.0 - 8.0   Glucose, UA NEGATIVE NEGATIVE mg/dL   Hgb urine dipstick NEGATIVE NEGATIVE   Bilirubin Urine NEGATIVE NEGATIVE   Ketones, ur  NEGATIVE NEGATIVE mg/dL   Protein, ur 409 (A) NEGATIVE mg/dL   Nitrite NEGATIVE NEGATIVE   Leukocytes,Ua NEGATIVE NEGATIVE   RBC / HPF 0-5 0 - 5 RBC/hpf   WBC, UA 0-5 0 - 5 WBC/hpf   Bacteria, UA RARE (A) NONE SEEN   Squamous Epithelial / HPF 0-5 0 - 5 /HPF   Hyaline Casts, UA PRESENT     Comment: Performed at [facility], [address], [place], [place] [address]  Lipase, blood     Status: None   Collection Time: 05/05/23  5:09 PM  Result Value Ref Range   Lipase 30 11 - 51 U/L    Comment: Performed at [facility], [address],  [place], [place] [address]  Comprehensive metabolic panel     Status: Abnormal   Collection Time: 05/05/23  5:09 PM  Result Value Ref Range   Sodium 142 135 - 145 mmol/L   Potassium 3.1 (L) 3.5 - 5.1 mmol/L   Chloride 107 98 - 111 mmol/L   CO2 24 22 - 32 mmol/L   Glucose, Bld 100 (H) 70 - 99 mg/dL    Comment: Glucose reference range applies only to samples taken after fasting for at least 8 hours.   BUN 50 (H) 8 - 23 mg/dL   Creatinine, Ser 5.62 (H) 0.44 - 1.00 mg/dL   Calcium 8.6 (L) 8.9 - 10.3 mg/dL   Total Protein 6.7 6.5 - 8.1 g/dL   Albumin 3.4 (L) 3.5 - 5.0 g/dL   AST 15 15 - 41 U/L   ALT 9 0 - 44 U/L   Alkaline Phosphatase 54 38 - 126 U/L   Total Bilirubin 0.7 0.3 - 1.2 mg/dL   GFR, Estimated 11 (L) >60 mL/min    Comment: (NOTE) Calculated using the CKD-EPI Creatinine Equation (2021)    Anion gap 11 5 - 15    Comment: Performed at [facility], [address] [place], [place] [address]  CBC     Status: Abnormal   Collection Time: 05/05/23  5:09 PM  Result Value Ref Range   WBC 4.2 4.0 - 10.5 K/uL   RBC 3.71 (L) 3.87 - 5.11 MIL/uL   Hemoglobin 12.1 12.0 - 15.0 g/dL   HCT 57.8 46.9 - 62.9 %   MCV 98.7 80.0 - 100.0 fL   MCH 32.6 26.0 - 34.0 pg   MCHC 33.1 30.0 - 36.0 g/dL   RDW 52.8 41.3 - 24.4 %   Platelets 192 150 - 400 K/uL   nRBC 0.0 0.0 - 0.2 %    Comment: Performed at [facility], [address], [place], [place] [address]   No results found.  Pending Labs Unresulted Labs (From admission, onward)    None       Vitals/Pain Today's Vitals   05/05/23 1701 05/05/23 2124 05/05/23 2321 05/05/23 2330  BP:  (!) 176/103 132/87   Pulse:  (!) 59 73   Resp:  16    Temp:  (!) 97.3 F (36.3 C) 97.6 F (36.4 C)   TempSrc:  Oral Oral   SpO2:  98% 100%   PainSc: 0-No pain 0-No pain  0-No pain    Isolation Precautions No active isolations  Medications Medications  lactated ringers bolus 1,000 mL (has no administration in time range)  potassium  chloride SA (KLOR-CON M) CR tablet 40 mEq (40 mEq Oral Given 05/05/23 2213)  lactated ringers bolus 1,000 mL (1,000 mLs Intravenous  New Bag/Given 05/05/23 2218)    Mobility walks     Focused Assessments Renal Assessment Handoff:  Hemodialysis Schedule: N/A Last Hemodialysis date and time: not a dialysis pt   Restricted appendage: N/A   R Recommendations: See Admitting Provider Note  Report given to:   Additional Notes: currently infusing 1st bag LR with 1 more to go. Pt AxO x4, independent

## 2023-05-05 NOTE — ED Triage Notes (Signed)
Pt referred to ED by PCP after having abnormal renal lab workup yesterday. Pt denies somatic complaints in triage. No changes in urination. No pain per pt.

## 2023-05-06 ENCOUNTER — Observation Stay (HOSPITAL_COMMUNITY): Payer: Medicare (Managed Care)

## 2023-05-06 ENCOUNTER — Encounter (HOSPITAL_COMMUNITY): Payer: Self-pay | Admitting: Internal Medicine

## 2023-05-06 DIAGNOSIS — E785 Hyperlipidemia, unspecified: Secondary | ICD-10-CM | POA: Diagnosis not present

## 2023-05-06 DIAGNOSIS — N179 Acute kidney failure, unspecified: Secondary | ICD-10-CM | POA: Diagnosis present

## 2023-05-06 DIAGNOSIS — N184 Chronic kidney disease, stage 4 (severe): Secondary | ICD-10-CM | POA: Insufficient documentation

## 2023-05-06 LAB — CBC WITH DIFFERENTIAL/PLATELET
Abs Immature Granulocytes: 0.01 10*3/uL (ref 0.00–0.07)
Basophils Absolute: 0 10*3/uL (ref 0.0–0.1)
Basophils Relative: 0 %
Eosinophils Absolute: 0.1 10*3/uL (ref 0.0–0.5)
Eosinophils Relative: 3 %
HCT: 31.2 % — ABNORMAL LOW (ref 36.0–46.0)
Hemoglobin: 10.4 g/dL — ABNORMAL LOW (ref 12.0–15.0)
Immature Granulocytes: 0 %
Lymphocytes Relative: 35 %
Lymphs Abs: 1.7 10*3/uL (ref 0.7–4.0)
MCH: 31.7 pg (ref 26.0–34.0)
MCHC: 33.3 g/dL (ref 30.0–36.0)
MCV: 95.1 fL (ref 80.0–100.0)
Monocytes Absolute: 0.5 10*3/uL (ref 0.1–1.0)
Monocytes Relative: 11 %
Neutro Abs: 2.4 10*3/uL (ref 1.7–7.7)
Neutrophils Relative %: 51 %
Platelets: 165 10*3/uL (ref 150–400)
RBC: 3.28 MIL/uL — ABNORMAL LOW (ref 3.87–5.11)
RDW: 12.6 % (ref 11.5–15.5)
WBC: 4.7 10*3/uL (ref 4.0–10.5)
nRBC: 0 % (ref 0.0–0.2)

## 2023-05-06 LAB — GLUCOSE, CAPILLARY
Glucose-Capillary: 90 mg/dL (ref 70–99)
Glucose-Capillary: 86 mg/dL (ref 70–99)
Glucose-Capillary: 104 mg/dL — ABNORMAL HIGH (ref 70–99)
Glucose-Capillary: 118 mg/dL — ABNORMAL HIGH (ref 70–99)

## 2023-05-06 LAB — BASIC METABOLIC PANEL
Anion gap: 9 (ref 5–15)
BUN: 46 mg/dL — ABNORMAL HIGH (ref 8–23)
CO2: 27 mmol/L (ref 22–32)
Calcium: 8.5 mg/dL — ABNORMAL LOW (ref 8.9–10.3)
Chloride: 107 mmol/L (ref 98–111)
Creatinine, Ser: 3.69 mg/dL — ABNORMAL HIGH (ref 0.44–1.00)
GFR, Estimated: 13 mL/min — ABNORMAL LOW (ref >60)
Glucose, Bld: 142 mg/dL — ABNORMAL HIGH (ref 70–99)
Potassium: 3.2 mmol/L — ABNORMAL LOW (ref 3.5–5.1)
Sodium: 143 mmol/L (ref 135–145)

## 2023-05-06 LAB — HEPATIC FUNCTION PANEL
ALT: 9 U/L (ref 0–44)
AST: 13 U/L — ABNORMAL LOW (ref 15–41)
Albumin: 3 g/dL — ABNORMAL LOW (ref 3.5–5.0)
Alkaline Phosphatase: 47 U/L (ref 38–126)
Bilirubin, Direct: <0.1 mg/dL (ref 0.0–0.2)
Total Bilirubin: 0.4 mg/dL (ref 0.3–1.2)
Total Protein: 5.9 g/dL — ABNORMAL LOW (ref 6.5–8.1)

## 2023-05-06 LAB — CK: Total CK: 127 U/L (ref 38–234)

## 2023-05-06 LAB — HIV ANTIBODY (ROUTINE TESTING W REFLEX): HIV Screen 4th Generation wRfx: NONREACTIVE

## 2023-05-06 MED ORDER — METOPROLOL TARTRATE 50 MG PO TABS
50.0000 mg | ORAL_TABLET | Freq: Two times a day (BID) | ORAL | Status: DC
  Administered 2023-05-06 – 2023-05-08 (×6): 50 mg via ORAL
  Filled 2023-05-06 (×5): qty 1

## 2023-05-06 MED ORDER — SODIUM CHLORIDE 0.9 % IV SOLN: INTRAVENOUS | Status: DC

## 2023-05-06 MED ORDER — INSULIN ASPART 100 UNIT/ML IJ SOLN: 0.0000 [IU] | Freq: Three times a day (TID) | INTRAMUSCULAR | Status: DC

## 2023-05-06 MED ORDER — ATORVASTATIN CALCIUM 10 MG PO TABS
10.0000 mg | ORAL_TABLET | Freq: Every day | ORAL | Status: DC
  Administered 2023-05-06 – 2023-05-08 (×3): 10 mg via ORAL
  Filled 2023-05-06 (×3): qty 1

## 2023-05-06 MED ORDER — ASPIRIN 325 MG PO TBEC
325.0000 mg | DELAYED_RELEASE_TABLET | Freq: Every day | ORAL | Status: DC
  Administered 2023-05-06 – 2023-05-08 (×3): 325 mg via ORAL
  Filled 2023-05-06 (×3): qty 1

## 2023-05-06 MED ORDER — AMLODIPINE BESYLATE 10 MG PO TABS
10.0000 mg | ORAL_TABLET | Freq: Every day | ORAL | Status: DC
  Administered 2023-05-06 – 2023-05-08 (×3): 10 mg via ORAL
  Filled 2023-05-06 (×3): qty 1

## 2023-05-06 MED ORDER — HYDRALAZINE HCL 25 MG PO TABS
25.0000 mg | ORAL_TABLET | Freq: Three times a day (TID) | ORAL | Status: DC
  Administered 2023-05-06 – 2023-05-07 (×4): 25 mg via ORAL
  Filled 2023-05-06 (×4): qty 1

## 2023-05-06 MED ORDER — GABAPENTIN 300 MG PO CAPS
300.0000 mg | ORAL_CAPSULE | Freq: Every morning | ORAL | Status: DC
  Administered 2023-05-06 – 2023-05-08 (×3): 300 mg via ORAL
  Filled 2023-05-06 (×3): qty 1

## 2023-05-06 MED ORDER — HEPARIN SODIUM (PORCINE) 5000 UNIT/ML IJ SOLN
5000.0000 [IU] | Freq: Three times a day (TID) | INTRAMUSCULAR | Status: DC
  Filled 2023-05-06: qty 1

## 2023-05-06 MED ORDER — ACETAMINOPHEN 325 MG PO TABS
650.0000 mg | ORAL_TABLET | Freq: Four times a day (QID) | ORAL | Status: DC | PRN
  Administered 2023-05-08: 650 mg via ORAL
  Filled 2023-05-06: qty 2

## 2023-05-06 MED ORDER — HYDRALAZINE HCL 20 MG/ML IJ SOLN
5.0000 mg | INTRAMUSCULAR | Status: DC | PRN
  Administered 2023-05-07: 5 mg via INTRAVENOUS
  Filled 2023-05-06: qty 1

## 2023-05-06 MED ORDER — ACETAMINOPHEN 650 MG RE SUPP: 650.0000 mg | Freq: Four times a day (QID) | RECTAL | Status: DC | PRN

## 2023-05-06 NOTE — Progress Notes (Signed)
PROGRESS NOTE  Mary Stephens  DOB: 1956/03/21  PCP: System, Provider Not In ZOX:[id number]  DOA: 05/05/2023  LOS: 0 days  Hospital Day: 2  Brief narrative: Mary Stephens is a 67 y.o. female with PMH significant for DM2, HTN, HLD, h/o aortic dissection surgery, GERD, neuropathy, hep C, arthritis, anxiety/depression 5/22, patient was seen by PCP with complaint of 1 week history of diarrhea which self-limited.  Lab work showed creatinine elevated to over 4 and was hence sent to the ED on 5/23  In the ED, afebrile, heart rate in 70s, blood pressure 182/108, breathing on room air. Labs showed unremarkable CBC, potassium low at 3.1, BN/creatinine 50/4.25 Urinalysis showed microalbuminuria. Patient was started on IV fluid Admitted to [facility]  This morning, I got a call from [facility]'s primary care NP at [facility] who is stated that patient's baseline creatinine is in the range of 2.5 from October 2023.  Patient also not consistent with compliance to medicines and follow-ups.  Patient had been referred to nephrology in the past but she has not had an appointment yet.  She requested me to obtain a renal artery duplex ultrasound as well as aldosterone renin level as part of the workup.  Subjective: Patient was seen and examined this morning Chart reviewed. Blood pressure gradually improved overnight, 150s this morning Labs from this morning with creatinine improving gradually, 3.69.  Assessment and plan: AKI on CKD 4 Baseline creatinine 2.58 in Oct 2023.  Presented with creatinine elevated to 4.25 in the setting of recent diarrhea Renal ultrasound showed bilateral increased renal echogenicity secondary to medical renal disease.  No evidence of obstruction. Started on IV fluid.  Creatinine gradually improving.  3.69 today. Continue to monitor on IV fluid. I ordered for renal artery duplex as aldosterone renin level. Recent Labs    05/05/23 1709 05/06/23 0321  BUN 50* 46*  CREATININE  4.25* 3.69*   Uncontrolled hypertension Blood pressure was elevated to 180s on admission. PTA on metoprolol titrate 100 mg twice daily, amlodipine 10 mg daily, hydralazine 25 mg 3 times daily Currently ordered for metoprolol tartrate 50 mg twice daily, amlodipine 10 mg daily and hydralazine 25 mg 3 times daily.  Blood pressure gradually improving. Continue to monitor.  Type 2 diabetes mellitus A1c 5.3 from 2019.  Repeat A1c PTA, patient has not been on any antidiabetic meds for the last at least 1 year. Blood sugar level in 140s this morning. Currently on SSI/Accu-Cheks Lab Results  Component Value Date   HGBA1C 5.3 01/27/2018   Recent Labs  Lab 05/06/23 0737 05/06/23 1140  GLUCAP 90 86   History of aortic aneurysm repair HLD PTA on aspirin 325 mg daily, Lipitor 10 mg daily  Continue the same  Peripheral neuropathy PTA on Neurontin 300 mg daily Resume Neurontin.  Mobility: Encourage ambulation  Goals of care   Code Status: Full Code     DVT prophylaxis:  heparin injection 5,000 Units Start: 05/06/23 0600   Antimicrobials: None Fluid: Resume NS at 100 mill per hour Consultants: None Family Communication: None at bedside  Status: Observation Level of care:  Telemetry Medical   Patient from: Home Anticipated d/c to: Home in 1 to 2 days Needs to continue in-hospital care:  Needs IV hydration to continue for now     Diet:  Diet Order             Diet NPO time specified  Diet effective midnight  Diet renal/carb modified with fluid restriction Diet-HS Snack? Nothing; Fluid restriction: 1200 mL Fluid; Room service appropriate? Yes; Fluid consistency: Thin  Diet effective now                   Scheduled Meds:  amLODipine  10 mg Oral Daily   aspirin EC  325 mg Oral Daily   atorvastatin  10 mg Oral Daily   gabapentin  300 mg Oral q AM   heparin  5,000 Units Subcutaneous Q8H   hydrALAZINE  25 mg Oral TID   insulin aspart  0-6 Units  Subcutaneous TID WC   metoprolol tartrate  50 mg Oral BID    PRN meds: acetaminophen **OR** acetaminophen, hydrALAZINE   Infusions:   sodium chloride 100 mL/hr at 05/06/23 1010    Antimicrobials: Anti-infectives (From admission, onward)    None       Nutritional status: There is no height or weight on file to calculate BMI.          Objective: Vitals:   05/06/23 0531 05/06/23 0949  BP: (!) 159/94 (!) 161/96  Pulse: 62 72  Resp: 18 17  Temp: 98 F (36.7 C) 98 F (36.7 C)  SpO2: 97% 98%    Intake/Output Summary (Last 24 hours) at 05/06/2023 1330 Last data filed at 05/06/2023 0532 Gross per 24 hour  Intake 360 ml  Output 900 ml  Net -540 ml   There were no vitals filed for this visit. Weight change:  There is no height or weight on file to calculate BMI.   Physical Exam: General exam: Pleasant, elderly African-American female.  Not in distress Skin: No rashes, lesions or ulcers. HEENT: Atraumatic, normocephalic, no obvious bleeding Lungs: Clear to auscultation bilaterally CVS: Regular rate and rhythm, no murmur GI/Abd soft, nontender, nondistended, bowel sound present CNS: Alert, awake, oriented x 3 Psychiatry: Mood appropriate Extremities: No pedal edema, no calf tenderness  Data Review: I have personally reviewed the laboratory data and studies available.  F/u labs ordered Unresulted Labs (From admission, onward)     Start     Ordered   05/07/23 0500  CBC with Differential/Platelet  Daily,   R      05/06/23 0856   05/07/23 0500  Basic metabolic panel  Daily,   R      05/06/23 0856   05/06/23 1127  Aldosterone + renin activity w/ ratio  Add-on,   AD        05/06/23 1127   05/06/23 0227  Hemoglobin A1c  Once,   R       Comments: To assess prior glycemic control    05/06/23 0228   05/06/23 0227  CBC  (heparin)  Once,   R       Comments: Baseline for heparin therapy IF NOT ALREADY DRAWN.  Notify MD if PLT < 100 K.    05/06/23 0228             Total time spent in review of labs and imaging, patient evaluation, formulation of plan, documentation and communication with family: 65 minutes  Signed, Lorin Glass, MD [facility] 05/06/2023

## 2023-05-06 NOTE — TOC Progression Note (Signed)
Transition of Care [facility]) - Initial/Assessment Note    Patient Details  Name: Mary Stephens MRN: [id number] Date of Birth: 11-19-1956  Transition [facility] CM/SW Contact:    Ralene Bathe, LCSWA Phone Number: 05/06/2023, 1:56 PM  Clinical Narrative:                 LCSW received a call from [facility] with [facility].  The agency reports that they will transport the patient home when medically ready.  The number to call is [phone].    TOC following.   Patient Goals and CMS Choice            Expected Discharge Plan and Services                                              Prior Living Arrangements/Services                       Activities of Daily Living      Permission Sought/Granted                  Emotional Assessment              Admission diagnosis:  AKI (acute kidney injury) (HCC) [N17.9] ARF (acute renal failure) (HCC) [N17.9] Patient Active Problem List   Diagnosis Date Noted   CKD (chronic kidney disease) stage 4, GFR 15-29 ml/min (HCC) 05/06/2023   HLD (hyperlipidemia) 05/06/2023   ARF (acute renal failure) (HCC) 05/06/2023   AKI (acute kidney injury) (HCC) 05/05/2023   S/P ascending aortic replacement 12/02/2020   Essential hypertension 05/14/2015   Tobacco dependence 12/27/2014   DM (diabetes mellitus), type 2 (HCC) 09/12/2014   Chronic hepatitis C virus infection (HCC) 08/14/2014   PCP:  System, Provider Not In Pharmacy:   [id number] [place] [facility] - [address] [place] [address] Phone: [phone] Fax: [phone]  [id number] [place], [place] - [address] [place] [address] Phone: [phone] Fax: [phone]     Social Determinants of Health (SDOH) Social History: SDOH Screenings   Tobacco Use: High Risk (05/06/2023)   SDOH Interventions:     Readmission Risk Interventions     No data to display

## 2023-05-06 NOTE — H&P (Signed)
History and Physical    Mary Stephens ZOX:[id number] DOB: Apr 18, 1956 DOA: 05/05/2023  Patient coming from: Home.  Chief Complaint: Abnormal labs.  HPI: Mary Stephens is a 67 y.o. female with history of chronic kidney disease stage IV, hypertension, diabetes mellitus type 2, hyperlipidemia, status post aortic dissection surgery had routine labs done at her primary care's office and was found to have elevated creatinine was advised to come to the ER.  Patient states about last week she had 3 or 4 episodes of diarrhea.  Which was self-limited.  Denies any nausea or vomiting.  Does not take any NSAIDs.  ED Course: In the ER patient's creatinine was found to be 4.2.  Patient's last creatinine was 3.02 on 12/2021.  Urine shows mild proteinuria.  Patient was given 2 L fluid bolus and admitted for acute on chronic kidney disease.  Patient is making urine.  Review of Systems: As per HPI, rest all negative.   Past Medical History:  Diagnosis Date   Anxiety    Arthritis    Cataract    forming- very small   Colon polyps    Depression    Diabetes mellitus without complication (HCC)    off meds for diabetes- diet controlled   GERD (gastroesophageal reflux disease)    H/O aortic dissection 2021   Hepatitis C    Hyperlipidemia    Hypertension    Neuropathy    feet    Past Surgical History:  Procedure Laterality Date   COLONOSCOPY  ?2001,2011?   in [place] (1st colon 20 polyps removed-per pt)   IR THORACENTESIS ASP PLEURAL SPACE W/IMG GUIDE  12/09/2020   PARTIAL HYSTERECTOMY  2001   REPAIR OF ACUTE ASCENDING THORACIC AORTIC DISSECTION N/A 12/02/2020   Procedure: REPAIR OF ACUTE ASCENDING THORACIC AORTIC DISSECTION VIA CIRC ARREST USING HEMASHIELD PLATINUM 28 MM VASCULAR GRAFT.;  Surgeon: Corliss Skains, MD;  Location: [facility] OR;  Service: Vascular;  Laterality: N/A;  AXILLARY CANNULATION AND CIRC ARREST   REPAIR OF ACUTE ASCENDING THORACIC AORTIC DISSECTION VIA CIRC ARREST USING  HEMASHIELD PLATINUM 28 MM VASCULAR GRAFT. (N/A)  12/02/2020   TEE WITHOUT CARDIOVERSION  12/02/2020   Procedure: TRANSESOPHAGEAL ECHOCARDIOGRAM (TEE);  Surgeon: Corliss Skains, MD;  [facility];  Service: Vascular;;     reports that she has been smoking cigarettes. She has been smoking an average of .25 packs per day. She has never used smokeless tobacco. She reports current alcohol use of about 1.0 standard drink of alcohol per week. She reports that she does not currently use drugs after having used the following drugs: Marijuana.  No Known Allergies  Family History  Problem Relation Age of Onset   Diabetes Mother    Colon cancer Neg Hx    Colon polyps Neg Hx    Esophageal cancer Neg Hx    Rectal cancer Neg Hx    Stomach cancer Neg Hx     Prior to Admission medications   Medication Sig Start Date End Date Taking? Authorizing Provider  acetaminophen (TYLENOL) 500 MG tablet Take 1,000 mg by mouth every 8 (eight) hours as needed for moderate pain.   Yes [provider]  amLODipine (NORVASC) 10 MG tablet Take 1 tablet (10 mg total) by mouth every morning. Patient taking differently: Take 10 mg by mouth daily. 01/27/18  Yes Claiborne Rigg, NP  aspirin EC 325 MG EC tablet Take 1 tablet (325 mg total) by mouth daily. 12/10/20  Yes Barrett, Rae Roam, PA-C  atorvastatin (LIPITOR) 10 MG tablet Take 1 tablet (10 mg total) by mouth daily. 01/27/18  Yes Claiborne Rigg, NP  busPIRone (BUSPAR) 10 MG tablet Take 10 mg by mouth 2 (two) times daily as needed (for anxiety).   Yes [provider]  Artificial Saliva (DRY MOUTH SPRAY MT) Use as directed 1 spray in the mouth or throat 6 (six) times daily. Biotene    [provider]  calcium carbonate (OSCAL) 1500 (600 Ca) MG TABS tablet Take 600 mg of elemental calcium by mouth daily with breakfast. Patient not taking: Reported on 05/05/2023    [provider]  cloNIDine (CATAPRES) 0.2 MG tablet Take 1 tablet  (0.2 mg total) by mouth 2 (two) times daily. Patient not taking: Reported on 03/25/2022 12/12/20   Ardelle Balls, PA-C  diclofenac Sodium (VOLTAREN) 1 % GEL Apply 2-4 g topically 4 (four) times daily as needed (for bilateral foot pain).    [provider]  dronabinol (MARINOL) 2.5 MG capsule Take 1 capsule (2.5 mg total) by mouth 2 (two) times daily before lunch and supper. 01/16/21   Lightfoot, Eliezer Lofts, MD  gabapentin (NEURONTIN) 300 MG capsule Take 300 mg by mouth 2 (two) times daily.    [provider]  Glucerna (GLUCERNA) LIQD Take 237 mLs by mouth 2 (two) times daily between meals.    [provider]  glucose blood test strip Check BS twice daily, 250.00 09/12/14   Ambrose Finland, NP  glucose monitoring kit (FREESTYLE) monitoring kit 1 each by Does not apply route as needed for other. Please check BS twice daily before meals, 250.00 09/12/14   Ambrose Finland, NP  hydrALAZINE (APRESOLINE) 25 MG tablet Take 25 mg by mouth 3 (three) times daily.    [provider]  HYDROcodone-acetaminophen (NORCO/VICODIN) 5-325 MG tablet Take 2 tablets by mouth every 4 (four) hours as needed. 12/13/22   Theron Arista, PA-C  Lancets (FREESTYLE) lancets Check BS twice daily before meals, 250.00 09/12/14   Holland Commons A, NP  melatonin 3 MG TABS tablet Take 3 mg by mouth at bedtime as needed (insomnia).    [provider]  metoprolol tartrate (LOPRESSOR) 50 MG tablet Take 1 tablet (50 mg total) by mouth 2 (two) times daily. 12/10/20   Barrett, Erin R, PA-C  glipiZIDE (GLUCOTROL XL) 5 MG 24 hr tablet Take 1 tablet (5 mg total) by mouth daily with breakfast. Patient not taking: Reported on 06/20/2020 01/27/18 06/20/20  Claiborne Rigg, NP    Physical Exam: Constitutional: Moderately built and nourished. Vitals:   05/05/23 1658 05/05/23 2124 05/05/23 2321 05/06/23 0034  BP: (!) 182/108 (!) 176/103 132/87 (!) 162/96  Pulse: 70 (!) 59 73 67  Resp: 16 16  17   Temp: 98.5  F (36.9 C) (!) 97.3 F (36.3 C) 97.6 F (36.4 C) 98.6 F (37 C)  TempSrc: Oral Oral Oral Oral  SpO2: 97% 98% 100% 99%   Eyes: Anicteric no pallor. ENMT: No discharge from the ears eyes nose and mouth. Neck: No mass felt.  No neck rigidity. Respiratory: No rhonchi or crepitations. Cardiovascular: S1-S2 heard. Abdomen: Soft nontender bowel sounds present. Musculoskeletal: No edema. Skin: No rash.   Neurologic: Alert awake oriented to time place and person.  Moves all extremities. Psychiatric: Appears normal.  Normal affect.   Labs on Admission: I have personally reviewed following labs and imaging studies  CBC: Recent Labs  Lab 05/05/23 1709  WBC 4.2  HGB 12.1  HCT  36.6  MCV 98.7  PLT 192   Basic Metabolic Panel: Recent Labs  Lab 05/05/23 1709  NA 142  K 3.1*  CL 107  CO2 24  GLUCOSE 100*  BUN 50*  CREATININE 4.25*  CALCIUM 8.6*   GFR: CrCl cannot be calculated (Unknown ideal weight.). Liver Function Tests: Recent Labs  Lab 05/05/23 1709  AST 15  ALT 9  ALKPHOS 54  BILITOT 0.7  PROT 6.7  ALBUMIN 3.4*   Recent Labs  Lab 05/05/23 1709  LIPASE 30   No results for input(s): "AMMONIA" in the last 168 hours. Coagulation Profile: No results for input(s): "INR", "PROTIME" in the last 168 hours. Cardiac Enzymes: No results for input(s): "CKTOTAL", "CKMB", "CKMBINDEX", "TROPONINI" in the last 168 hours. BNP (last 3 results) No results for input(s): "PROBNP" in the last 8760 hours. HbA1C: No results for input(s): "HGBA1C" in the last 72 hours. CBG: No results for input(s): "GLUCAP" in the last 168 hours. Lipid Profile: No results for input(s): "CHOL", "HDL", "LDLCALC", "TRIG", "CHOLHDL", "LDLDIRECT" in the last 72 hours. Thyroid Function Tests: No results for input(s): "TSH", "T4TOTAL", "FREET4", "T3FREE", "THYROIDAB" in the last 72 hours. Anemia Panel: No results for input(s): "VITAMINB12", "FOLATE", "FERRITIN", "TIBC", "IRON", "RETICCTPCT" in the  last 72 hours. Urine analysis:    Component Value Date/Time   COLORURINE YELLOW 05/05/2023 1703   APPEARANCEUR CLEAR 05/05/2023 1703   LABSPEC 1.012 05/05/2023 1703   PHURINE 5.0 05/05/2023 1703   GLUCOSEU NEGATIVE 05/05/2023 1703   HGBUR NEGATIVE 05/05/2023 1703   BILIRUBINUR NEGATIVE 05/05/2023 1703   KETONESUR NEGATIVE 05/05/2023 1703   PROTEINUR 100 (A) 05/05/2023 1703   NITRITE NEGATIVE 05/05/2023 1703   LEUKOCYTESUR NEGATIVE 05/05/2023 1703   Sepsis Labs: @LABRCNTIP (procalcitonin:4,lacticidven:4) )No results found for this or any previous visit (from the past 240 hour(s)).   Radiological Exams on Admission: No results found.  Assessment/Plan Principal Problem:   AKI (acute kidney injury) (HCC) Active Problems:   DM (diabetes mellitus), type 2 (HCC)   S/P ascending aortic replacement   CKD (chronic kidney disease) stage 4, GFR 15-29 ml/min (HCC)   HLD (hyperlipidemia)   ARF (acute renal failure) (HCC)    Acute on chronic kidney disease stage IV likely could be progression of her known renal disease or could have been slightly worsened due to recent diarrhea.  Did receive 2 L of fluid in the ER.  Patient is making urine.  Will check renal ultrasound.  Will hold off further fluids.  Recheck metabolic panel.  Check CK levels.  May discuss with nephrologist in the morning. Hypertension uncontrolled on hydralazine, metoprolol, amlodipine.  I have ordered as needed IV hydralazine.  Follow blood pressure trends. Hyperlipidemia on statins.  Checking CK levels. History of diabetes mellitus type 2 has not been on any antidiabetic medications for last year.  On sliding scale coverage. History of aortic aneurysm repair.   DVT prophylaxis: Heparin. Code Status: Full code. Family Communication: Discussed with patient. Disposition Plan: Medical floor. Consults called: None. Admission status: Observation.

## 2023-05-06 NOTE — Progress Notes (Signed)
  Transition of Care [facility]) Screening Note   Patient Details  Name: Mary Stephens Date of Birth: 08-13-1956   Transition of Care [facility]) CM/SW Contact:    Tom-Johnson, Hershal Coria, RN Phone Number: 05/06/2023, 11:03 AM  Patient is admitted for AKI. Creatinine on admit was 4.25, today at 3.69. IV fluids given. Has hx of CKD 4.   From home alone, has one daughter. Retired, independent with care and drive self. Does not have DME's at home.  PCP is Tonye Becket, MD and uses [facility] on [address]. Patient is with [facility] and will transport at discharge.      Transition of Care Department [facility]) has reviewed patient and no TOC needs or recommendations have been identified at this time. TOC will continue to monitor patient advancement through interdisciplinary progression rounds. If new patient transition needs arise, please place a TOC consult.

## 2023-05-07 ENCOUNTER — Observation Stay (HOSPITAL_COMMUNITY): Payer: Medicare (Managed Care)

## 2023-05-07 DIAGNOSIS — Z833 Family history of diabetes mellitus: Secondary | ICD-10-CM | POA: Diagnosis not present

## 2023-05-07 DIAGNOSIS — N184 Chronic kidney disease, stage 4 (severe): Secondary | ICD-10-CM | POA: Diagnosis not present

## 2023-05-07 DIAGNOSIS — E1142 Type 2 diabetes mellitus with diabetic polyneuropathy: Secondary | ICD-10-CM

## 2023-05-07 DIAGNOSIS — Z95828 Presence of other vascular implants and grafts: Secondary | ICD-10-CM

## 2023-05-07 DIAGNOSIS — E876 Hypokalemia: Secondary | ICD-10-CM

## 2023-05-07 DIAGNOSIS — Z79899 Other long term (current) drug therapy: Secondary | ICD-10-CM | POA: Diagnosis not present

## 2023-05-07 DIAGNOSIS — E1140 Type 2 diabetes mellitus with diabetic neuropathy, unspecified: Secondary | ICD-10-CM | POA: Diagnosis present

## 2023-05-07 DIAGNOSIS — I10 Essential (primary) hypertension: Secondary | ICD-10-CM | POA: Diagnosis not present

## 2023-05-07 DIAGNOSIS — I129 Hypertensive chronic kidney disease with stage 1 through stage 4 chronic kidney disease, or unspecified chronic kidney disease: Secondary | ICD-10-CM | POA: Diagnosis present

## 2023-05-07 DIAGNOSIS — E785 Hyperlipidemia, unspecified: Secondary | ICD-10-CM | POA: Diagnosis not present

## 2023-05-07 DIAGNOSIS — I150 Renovascular hypertension: Secondary | ICD-10-CM

## 2023-05-07 DIAGNOSIS — K219 Gastro-esophageal reflux disease without esophagitis: Secondary | ICD-10-CM | POA: Diagnosis present

## 2023-05-07 DIAGNOSIS — N179 Acute kidney failure, unspecified: Secondary | ICD-10-CM | POA: Diagnosis not present

## 2023-05-07 DIAGNOSIS — F17210 Nicotine dependence, cigarettes, uncomplicated: Secondary | ICD-10-CM | POA: Diagnosis present

## 2023-05-07 DIAGNOSIS — Z91199 Patient's noncompliance with other medical treatment and regimen due to unspecified reason: Secondary | ICD-10-CM | POA: Diagnosis not present

## 2023-05-07 DIAGNOSIS — E1122 Type 2 diabetes mellitus with diabetic chronic kidney disease: Secondary | ICD-10-CM | POA: Diagnosis present

## 2023-05-07 DIAGNOSIS — Z7984 Long term (current) use of oral hypoglycemic drugs: Secondary | ICD-10-CM | POA: Diagnosis not present

## 2023-05-07 DIAGNOSIS — Z7982 Long term (current) use of aspirin: Secondary | ICD-10-CM | POA: Diagnosis not present

## 2023-05-07 LAB — CBC WITH DIFFERENTIAL/PLATELET
Abs Immature Granulocytes: 0.01 10*3/uL (ref 0.00–0.07)
Basophils Absolute: 0 10*3/uL (ref 0.0–0.1)
Basophils Relative: 1 %
Eosinophils Absolute: 0.1 10*3/uL (ref 0.0–0.5)
Eosinophils Relative: 3 %
HCT: 35.1 % — ABNORMAL LOW (ref 36.0–46.0)
Hemoglobin: 11.4 g/dL — ABNORMAL LOW (ref 12.0–15.0)
Immature Granulocytes: 0 %
Lymphocytes Relative: 38 %
Lymphs Abs: 1.9 10*3/uL (ref 0.7–4.0)
MCH: 31.8 pg (ref 26.0–34.0)
MCHC: 32.5 g/dL (ref 30.0–36.0)
MCV: 98 fL (ref 80.0–100.0)
Monocytes Absolute: 0.5 10*3/uL (ref 0.1–1.0)
Monocytes Relative: 10 %
Neutro Abs: 2.4 10*3/uL (ref 1.7–7.7)
Neutrophils Relative %: 48 %
Platelets: 170 10*3/uL (ref 150–400)
RBC: 3.58 MIL/uL — ABNORMAL LOW (ref 3.87–5.11)
RDW: 12.7 % (ref 11.5–15.5)
WBC: 5 10*3/uL (ref 4.0–10.5)
nRBC: 0 % (ref 0.0–0.2)

## 2023-05-07 LAB — BASIC METABOLIC PANEL
Anion gap: 10 (ref 5–15)
BUN: 37 mg/dL — ABNORMAL HIGH (ref 8–23)
CO2: 21 mmol/L — ABNORMAL LOW (ref 22–32)
Calcium: 8.5 mg/dL — ABNORMAL LOW (ref 8.9–10.3)
Chloride: 112 mmol/L — ABNORMAL HIGH (ref 98–111)
Creatinine, Ser: 2.87 mg/dL — ABNORMAL HIGH (ref 0.44–1.00)
GFR, Estimated: 17 mL/min — ABNORMAL LOW (ref >60)
Glucose, Bld: 102 mg/dL — ABNORMAL HIGH (ref 70–99)
Potassium: 3.2 mmol/L — ABNORMAL LOW (ref 3.5–5.1)
Sodium: 143 mmol/L (ref 135–145)

## 2023-05-07 LAB — HEMOGLOBIN A1C
Hgb A1c MFr Bld: 5.3 % (ref 4.8–5.6)
Mean Plasma Glucose: 105 mg/dL

## 2023-05-07 LAB — MAGNESIUM: Magnesium: 1.8 mg/dL (ref 1.7–2.4)

## 2023-05-07 LAB — GLUCOSE, CAPILLARY
Glucose-Capillary: 92 mg/dL (ref 70–99)
Glucose-Capillary: 87 mg/dL (ref 70–99)
Glucose-Capillary: 88 mg/dL (ref 70–99)

## 2023-05-07 MED ORDER — HYDRALAZINE HCL 20 MG/ML IJ SOLN: 5.0000 mg | INTRAMUSCULAR | Status: DC | PRN

## 2023-05-07 MED ORDER — POTASSIUM CHLORIDE CRYS ER 20 MEQ PO TBCR
40.0000 meq | EXTENDED_RELEASE_TABLET | Freq: Once | ORAL | Status: AC
  Administered 2023-05-07: 40 meq via ORAL
  Filled 2023-05-07: qty 2

## 2023-05-07 MED ORDER — HYDRALAZINE HCL 50 MG PO TABS
50.0000 mg | ORAL_TABLET | Freq: Three times a day (TID) | ORAL | Status: DC
  Administered 2023-05-07 (×2): 50 mg via ORAL
  Filled 2023-05-07 (×2): qty 1

## 2023-05-07 MED ORDER — HYDRALAZINE HCL 20 MG/ML IJ SOLN: 5.0000 mg | Freq: Four times a day (QID) | INTRAMUSCULAR | Status: DC | PRN

## 2023-05-07 NOTE — Progress Notes (Signed)
PROGRESS NOTE    Mary Stephens  VHQ:[id number] DOB: 1956/07/14 DOA: 05/05/2023 PCP: System, Provider Not In    Chief Complaint  Patient presents with   Abnormal Lab    Brief Narrative:  Mary Stephens is a 67 y.o. female with PMH significant for DM2, HTN, HLD, h/o aortic dissection surgery, GERD, neuropathy, hep C, arthritis, anxiety/depression 5/22, patient was seen by PCP with complaint of 1 week history of diarrhea which self-limited.  Lab work showed creatinine elevated to over 4 and was hence sent to the ED on 5/23   In the ED, afebrile, heart rate in 70s, blood pressure 182/108, breathing on room air. Labs showed unremarkable CBC, potassium low at 3.1, BN/creatinine 50/4.25 Urinalysis showed microalbuminuria. Patient was started on IV fluid Admitted to [facility]   Dr Pola Corn, got a call (05/06/2023) from patient's primary care NP at [facility] who is stated that patient's baseline creatinine is in the range of 2.5 from October 2023.  Patient also not consistent with compliance to medicines and follow-ups.  Patient had been referred to nephrology in the past but she has not had an appointment yet.  She requested to obtain a renal artery duplex ultrasound as well as aldosterone renin level as part of the workup.     Assessment & Plan:   Principal Problem:   AKI (acute kidney injury) (HCC) Active Problems:   DM (diabetes mellitus), type 2 (HCC)   Essential hypertension   S/P ascending aortic replacement   CKD (chronic kidney disease) stage 4, GFR 15-29 ml/min (HCC)   HLD (hyperlipidemia)   ARF (acute renal failure) (HCC)   Hypokalemia  #1 AKI on CKD stage IV -Baseline creatinine approximately 2.58 October 2023 -Patient noted to have presented with creatinine elevated at 4.25 on admission in the setting of recent diarrhea. -Likely secondary to prerenal azotemia. -Renal ultrasound with bilateral increased renal echogenicity secondary to medical renal disease, no evidence of  hydronephrosis. -Patient placed on IV fluids, urine output not properly recorded. -Renal function slowly improving creatinine currently at 2.87 from 3.69 from 4.25 on admission. -Continue hydration with IV fluids, avoid nephrotoxins. -Renal artery duplex ordered as requested from PCP as well as Aldo renin. -Patient noted to have had a referral made to nephrology in the outpatient setting and patient will need to follow-up with nephrology. -Supportive care.  2.  Uncontrolled hypertension -Likely secondary to noncompliance. -Patient states was on antihypertensive medication that did not make her feel well enough and as such was not taking it as prescribed. -Currently tolerating current blood pressure medications. -BP improving. -Continue Norvasc 10 mg daily, metoprolol 50 mg twice daily. -Increase hydralazine to 50 mg 3 times daily and may further uptitrate for better blood pressure control. -Renal artery duplex ordered.  Aldosterone renin also ordered and pending. -Importance of medication compliance stressed to patient. -Outpatient follow-up.  3.  Well-controlled diabetes mellitus type 2 -Hemoglobin A1c 5.3 (05/06/2023) -CBG 92 this morning. -SSI.  4.  Hypokalemia -Replete.  5.  Hyperlipidemia -Lipitor.  6.  Peripheral neuropathy -Continue home regimen Neurontin.  7.  History of aortic aneurysmal repair -Continue aspirin and Lipitor.   DVT prophylaxis: SCDs Code Status: Full Family Communication: Updated patient.  No family at bedside. Disposition: Likely home once renal function has improved and blood pressure improved.  Status is: Inpatient. The patient will require care spanning > 2 midnights and should be moved to inpatient because: Severity of illness   Consultants:  None  Procedures:  Renal ultrasound  05/06/2023 Renal artery duplex 05/07/2023  Antimicrobials:  Anti-infectives (From admission, onward)    None         Subjective: Patient laying in bed.   States she feels well.  No chest pain.  No shortness of breath.  No abdominal pain.  Just returning from renal duplex ultrasound.  Objective: Vitals:   05/06/23 2000 05/07/23 0013 05/07/23 0542 05/07/23 0908  BP: (!) 171/99 (!) 154/86 (!) 165/96 (!) 166/96  Pulse: 63 61 (!) 59 (!) 59  Resp: 17 17 16 17   Temp: 98.4 F (36.9 C) 98.3 F (36.8 C) 98.2 F (36.8 C) 97.8 F (36.6 C)  TempSrc: Oral Oral Oral Oral  SpO2: 98% 98% 97% 95%    Intake/Output Summary (Last 24 hours) at 05/07/2023 1605 Last data filed at 05/07/2023 1300 Gross per 24 hour  Intake 1482.7 ml  Output 400 ml  Net 1082.7 ml   There were no vitals filed for this visit.  Examination:  General exam: Appears calm and comfortable  Respiratory system: Clear to auscultation. Respiratory effort normal. Cardiovascular system: S1 & S2 heard, RRR. No JVD, murmurs, rubs, gallops or clicks. No pedal edema. Gastrointestinal system: Abdomen is nondistended, soft and nontender. No organomegaly or masses felt. Normal bowel sounds heard. Central nervous system: Alert and oriented. No focal neurological deficits. Extremities: Symmetric 5 x 5 power. Skin: No rashes, lesions or ulcers Psychiatry: Judgement and insight appear normal. Mood & affect appropriate.     Data Reviewed: I have personally reviewed following labs and imaging studies  CBC: Recent Labs  Lab 05/05/23 1709 05/06/23 0321 05/07/23 0146  WBC 4.2 4.7 5.0  NEUTROABS  --  2.4 2.4  HGB 12.1 10.4* 11.4*  HCT 36.6 31.2* 35.1*  MCV 98.7 95.1 98.0  PLT 192 165 170    Basic Metabolic Panel: Recent Labs  Lab 05/05/23 1709 05/06/23 0321 05/07/23 0141 05/07/23 0146  NA 142 143  --  143  K 3.1* 3.2*  --  3.2*  CL 107 107  --  112*  CO2 24 27  --  21*  GLUCOSE 100* 142*  --  102*  BUN 50* 46*  --  37*  CREATININE 4.25* 3.69*  --  2.87*  CALCIUM 8.6* 8.5*  --  8.5*  MG  --   --  1.8  --     GFR: CrCl cannot be calculated (Unknown ideal  weight.).  Liver Function Tests: Recent Labs  Lab 05/05/23 1709 05/06/23 0321  AST 15 13*  ALT 9 9  ALKPHOS 54 47  BILITOT 0.7 0.4  PROT 6.7 5.9*  ALBUMIN 3.4* 3.0*    CBG: Recent Labs  Lab 05/06/23 1140 05/06/23 1620 05/06/23 2158 05/07/23 0746 05/07/23 1126  GLUCAP 86 104* 118* 92 87     No results found for this or any previous visit (from the past 240 hour(s)).       Radiology Studies: VAS US RENAL ARTERY DUPLEX  Result Date: 05/07/2023 ABDOMINAL VISCERAL Patient Name:  Mary Stephens  Date of Exam:   05/07/2023 Medical Rec #: [id number]       Accession #:    [id number] Date of Birth: 1956-09-07       Patient Gender: F Patient Age:   56 years Exam Location:  [facility] Procedure:      VAS US RENAL ARTERY DUPLEX Referring Phys: [id number] [facility] -------------------------------------------------------------------------------- Indications: AKI High Risk Factors: Hypertension, hyperlipidemia, current smoker. Other Factors: History of aortic dissection 2021. Comparison  Study: 12-02-2020 CTA The left renal artery appears to arise from the                   false lumen and abruptly tapers (7/159) with absent                   enhancement of much of the renal parenchyma save a small                   amount of preserved upper pole enhancement supplied by a tiny                   accessory renal artery. Performing Technologist: Jean Rosenthal RDMS, RVT  Examination Guidelines: A complete evaluation includes B-mode imaging, spectral Doppler, color Doppler, and power Doppler as needed of all accessible portions of each vessel. Bilateral testing is considered an integral part of a complete examination. Limited examinations for reoccurring indications may be performed as noted.  Duplex Findings: +----------------------+--------+--------+------+--------+ Mesenteric            PSV cm/sEDV cm/sPlaqueComments +----------------------+--------+--------+------+--------+ Aorta Mid                 53                          +----------------------+--------+--------+------+--------+ Celiac Artery Proximal   99                          +----------------------+--------+--------+------+--------+ SMA Proximal            134                          +----------------------+--------+--------+------+--------+    +------------------+--------+--------+-------+ Right Renal ArteryPSV cm/sEDV cm/sComment +------------------+--------+--------+-------+ Origin               46      14           +------------------+--------+--------+-------+ Proximal             53      15           +------------------+--------+--------+-------+ Mid                  57      15           +------------------+--------+--------+-------+ Distal               47      10           +------------------+--------+--------+-------+ +-----------------+--------+--------+----------------+ Left Renal ArteryPSV cm/sEDV cm/s    Comment      +-----------------+--------+--------+----------------+ Origin                           Appears occluded +-----------------+--------+--------+----------------+ Proximal                         Appears occluded +-----------------+--------+--------+----------------+ Mid                              Appears occluded +-----------------+--------+--------+----------------+ +------------+--------+--------+----+-----------+--------+--------+---+ Right KidneyPSV cm/sEDV cm/sRI  Left KidneyPSV cm/sEDV cm/sRI  +------------+--------+--------+----+-----------+--------+--------+---+ Upper Pole  14      6       0.60Upper Pole                     +------------+--------+--------+----+-----------+--------+--------+---+  Mid         15      5       0.                            +------------+--------+--------+----+-----------+--------+--------+---+ Lower Pole  19      6       0.70Lower Pole                      +------------+--------+--------+----+-----------+--------+--------+---+ Hilar       48      12      0.75Hilar                          +------------+--------+--------+----+-----------+--------+--------+---+ +------------------+-----+------------------+-----+ Right Kidney           Left Kidney             +------------------+-----+------------------+-----+ RAR                    RAR                     +------------------+-----+------------------+-----+ RAR (manual)      1.1  RAR (manual)            +------------------+-----+------------------+-----+ Cortex                 Cortex                  +------------------+-----+------------------+-----+ Cortex thickness       Corex thickness         +------------------+-----+------------------+-----+ Kidney length (cm)11.04Kidney length (cm)10.82 +------------------+-----+------------------+-----+   Summary: Renal:  Right: No evidence of right renal artery stenosis. RRV flow present.        Cyst(s) noted. Normal right Resisitive Index. Increased        echogenicity of renal parenchyma. Left:  LRV flow present. Cyst(s) noted. Increased echogenicity of        renal parenchyma. Left renal artery appears occluded. No        arterial flow appreciated within the kidney. Mesenteric: Normal Celiac artery and Superior Mesenteric artery findings.  *See table(s) above for measurements and observations.     Preliminary    US RENAL  Result Date: 05/06/2023 CLINICAL DATA:  Acute renal failure. EXAM: RENAL / URINARY TRACT ULTRASOUND COMPLETE COMPARISON:  None Available. FINDINGS: Right Kidney: Renal measurements: 10.2 cm x 4.2 cm x 4.7 cm = volume: 105.55 mL. Diffusely increased echogenicity of the renal parenchyma is noted. A 1.5 cm x 1.4 cm x 1.3 cm simple cyst is seen within the upper pole of the right kidney. No hydronephrosis visualized. Left Kidney: Renal measurements: 6.9 cm x 3.6 cm x 3.2 cm = volume: 42.53 mL. Diffusely increased  echogenicity of the renal parenchyma is noted. Multiple simple left renal cysts are seen. The largest measures approximately 2.3 cm x 1.8 cm x 1.6 cm and is seen within the upper pole of the left kidney. No hydronephrosis visualized. Bladder: Appears normal for degree of bladder distention. Other: None. IMPRESSION: 1. Bilateral increased renal echogenicity which may be secondary to medical renal disease. 2. Multiple bilateral simple renal cysts. Electronically Signed   By: Aram Candela M.D.   On: 05/06/2023 03:36        Scheduled Meds:  amLODipine  10 mg Oral Daily   aspirin EC  325 mg Oral Daily   atorvastatin  10 mg Oral Daily   gabapentin  300 mg Oral q AM   hydrALAZINE  50 mg Oral TID   insulin aspart  0-6 Units Subcutaneous TID WC   metoprolol tartrate  50 mg Oral BID   Continuous Infusions:  sodium chloride 100 mL/hr at 05/07/23 0752     LOS: 0 days    Time spent: 40 minutes    Ramiro Harvest, MD [facility]   To contact the attending provider between 7A-7P or the covering provider during after hours 7P-7A, please log into the web site [email or web address] and access using universal  password for that web site. If you do not have the password, please call the hospital operator.  05/07/2023, 4:05 PM

## 2023-05-07 NOTE — Progress Notes (Incomplete)
PROGRESS NOTE    Mary Stephens  ZOX:[id number] DOB: [birth year] DOA: 05/05/2023 PCP: System, Provider Not In    Chief Complaint  Patient presents with  . Abnormal Lab    Brief Narrative:  Mary Stephens is a 67 y.o. female with PMH significant for DM2, HTN, HLD, h/o aortic dissection surgery, GERD, neuropathy, hep C, arthritis, anxiety/depression 5/22, patient was seen by PCP with complaint of 1 week history of diarrhea which self-limited.  Lab work showed creatinine elevated to over 4 and was hence sent to the ED on 5/23   In the ED, afebrile, heart rate in 70s, blood pressure 182/108, breathing on room air. Labs showed unremarkable CBC, potassium low at 3.1, BN/creatinine 50/4.25 Urinalysis showed microalbuminuria. Patient was started on IV fluid Admitted to [facility]   Dr Pola Corn, got a call (05/06/2023) from [facility] who is stated that patient's baseline creatinine is in the range of 2.5 from October 2023.  Patient also not consistent with compliance to medicines and follow-ups.  Patient had been referred to nephrology in the past but she has not had an appointment yet.  She requested to obtain a renal artery duplex ultrasound as well as aldosterone renin level as part of the workup.     Assessment & Plan:   Principal Problem:   AKI (acute kidney injury) (HCC) Active Problems:   DM (diabetes mellitus), type 2 (HCC)   Essential hypertension   S/P ascending aortic replacement   CKD (chronic kidney disease) stage 4, GFR 15-29 ml/min (HCC)   HLD (hyperlipidemia)   ARF (acute renal failure) (HCC)  #1 AKI on CKD stage IV -Baseline creatinine approximately 2.58 October 2023 -Patient noted to have presented with creatinine elevated at 4.25 on admission in the setting of recent diarrhea. -Likely secondary to prerenal azotemia. -Renal ultrasound with bilateral increased renal echogenicity secondary to medical renal disease, no evidence of  hydronephrosis. -Patient placed on IV fluids   DVT prophylaxis: SCDs Code Status: Full Family Communication: Updated patient.  No family at bedside. Disposition: Likely home once renal function has improved and blood pressure improved.  Status is: Inpatient. The patient will require care spanning > 2 midnights and should be moved to inpatient because: Severity of illness   Consultants:  None  Procedures:  Renal ultrasound 05/06/2023 Renal artery duplex 05/07/2023  Antimicrobials:  Anti-infectives (From admission, onward)    None         Subjective: Patient laying in bed.  States she feels well.  No chest pain.  No shortness of breath.  No abdominal pain.  Just returning from renal duplex ultrasound.  Objective: Vitals:   05/06/23 2000 05/07/23 0013 05/07/23 0542 05/07/23 0908  BP: (!) 171/99 (!) 154/86 (!) 165/96 (!) 166/96  Pulse: 63 61 (!) 59 (!) 59  Resp: 17 17 16 17   Temp: 98.4 F (36.9 C) 98.3 F (36.8 C) 98.2 F (36.8 C) 97.8 F (36.6 C)  TempSrc: Oral Oral Oral Oral  SpO2: 98% 98% 97% 95%    Intake/Output Summary (Last 24 hours) at 05/07/2023 1023 Last data filed at 05/07/2023 0900 Gross per 24 hour  Intake 1921.1 ml  Output 400 ml  Net 1521.1 ml   There were no vitals filed for this visit.  Examination:  General exam: Appears calm and comfortable  Respiratory system: Clear to auscultation. Respiratory effort normal. Cardiovascular system: S1 & S2 heard, RRR. No JVD, murmurs, rubs, gallops or clicks. No pedal edema. Gastrointestinal system:  Abdomen is nondistended, soft and nontender. No organomegaly or masses felt. Normal bowel sounds heard. Central nervous system: Alert and oriented. No focal neurological deficits. Extremities: Symmetric 5 x 5 power. Skin: No rashes, lesions or ulcers Psychiatry: Judgement and insight appear normal. Mood & affect appropriate.     Data Reviewed: I have personally reviewed following labs and imaging  studies  CBC: Recent Labs  Lab 05/05/23 1709 05/06/23 0321 05/07/23 0146  WBC 4.2 4.7 5.0  NEUTROABS  --  2.4 2.4  HGB 12.1 10.4* 11.4*  HCT 36.6 31.2* 35.1*  MCV 98.7 95.1 98.0  PLT 192 165 170    Basic Metabolic Panel: Recent Labs  Lab 05/05/23 1709 05/06/23 0321 05/07/23 0146  NA 142 143 143  K 3.1* 3.2* 3.2*  CL 107 107 112*  CO2 24 27 21*  GLUCOSE 100* 142* 102*  BUN 50* 46* 37*  CREATININE 4.25* 3.69* 2.87*  CALCIUM 8.6* 8.5* 8.5*    GFR: CrCl cannot be calculated (Unknown ideal weight.).  Liver Function Tests: Recent Labs  Lab 05/05/23 1709 05/06/23 0321  AST 15 13*  ALT 9 9  ALKPHOS 54 47  BILITOT 0.7 0.4  PROT 6.7 5.9*  ALBUMIN 3.4* 3.0*    CBG: Recent Labs  Lab 05/06/23 0737 05/06/23 1140 05/06/23 1620 05/06/23 2158 05/07/23 0746  GLUCAP 90 86 104* 118* 92     No results found for this or any previous visit (from the past 240 hour(s)).       Radiology Studies: US RENAL  Result Date: 05/06/2023 CLINICAL DATA:  Acute renal failure. EXAM: RENAL / URINARY TRACT ULTRASOUND COMPLETE COMPARISON:  None Available. FINDINGS: Right Kidney: Renal measurements: 10.2 cm x 4.2 cm x 4.7 cm = volume: 105.55 mL. Diffusely increased echogenicity of the renal parenchyma is noted. A 1.5 cm x 1.4 cm x 1.3 cm simple cyst is seen within the upper pole of the right kidney. No hydronephrosis visualized. Left Kidney: Renal measurements: 6.9 cm x 3.6 cm x 3.2 cm = volume: 42.53 mL. Diffusely increased echogenicity of the renal parenchyma is noted. Multiple simple left renal cysts are seen. The largest measures approximately 2.3 cm x 1.8 cm x 1.6 cm and is seen within the upper pole of the left kidney. No hydronephrosis visualized. Bladder: Appears normal for degree of bladder distention. Other: None. IMPRESSION: 1. Bilateral increased renal echogenicity which may be secondary to medical renal disease. 2. Multiple bilateral simple renal cysts. Electronically Signed    By: Aram Candela M.D.   On: 05/06/2023 03:36        Scheduled Meds: . amLODipine  10 mg Oral Daily  . aspirin EC  325 mg Oral Daily  . atorvastatin  10 mg Oral Daily  . gabapentin  300 mg Oral q AM  . hydrALAZINE  25 mg Oral TID  . insulin aspart  0-6 Units Subcutaneous TID WC  . metoprolol tartrate  50 mg Oral BID   Continuous Infusions: . sodium chloride 100 mL/hr at 05/07/23 0752     LOS: 0 days    Time spent: 40 minutes    Ramiro Harvest, MD [facility]   To contact the attending provider between 7A-7P or the covering provider during after hours 7P-7A, please log into the web site [email or web address] and access using universal [place] password for that web site. If you do not have the password, please call the hospital operator.  05/07/2023, 10:23 AM

## 2023-05-07 NOTE — Progress Notes (Signed)
Pt ambulated out in halls w/o difficulty.

## 2023-05-07 NOTE — Progress Notes (Signed)
   05/07/23 0013  Vitals  Temp 98.3 F (36.8 C)  Temp Source Oral  BP (!) 154/86  MAP (mmHg) 107  BP Location Left Arm  BP Method Automatic  Patient Position (if appropriate) Lying  Pulse Rate 61  Pulse Rate Source Monitor  Resp 17   pt had a 14 beat run of v-tach, pt is asymptomatic and vitals are stable

## 2023-05-08 DIAGNOSIS — E876 Hypokalemia: Secondary | ICD-10-CM | POA: Diagnosis not present

## 2023-05-08 DIAGNOSIS — N179 Acute kidney failure, unspecified: Secondary | ICD-10-CM | POA: Diagnosis not present

## 2023-05-08 DIAGNOSIS — E1142 Type 2 diabetes mellitus with diabetic polyneuropathy: Secondary | ICD-10-CM | POA: Diagnosis not present

## 2023-05-08 DIAGNOSIS — I10 Essential (primary) hypertension: Secondary | ICD-10-CM | POA: Diagnosis not present

## 2023-05-08 LAB — CBC WITH DIFFERENTIAL/PLATELET
Abs Immature Granulocytes: 0.01 10*3/uL (ref 0.00–0.07)
Basophils Absolute: 0 10*3/uL (ref 0.0–0.1)
Basophils Relative: 0 %
Eosinophils Absolute: 0.2 10*3/uL (ref 0.0–0.5)
Eosinophils Relative: 4 %
HCT: 32.6 % — ABNORMAL LOW (ref 36.0–46.0)
Hemoglobin: 10.7 g/dL — ABNORMAL LOW (ref 12.0–15.0)
Immature Granulocytes: 0 %
Lymphocytes Relative: 35 %
Lymphs Abs: 1.7 10*3/uL (ref 0.7–4.0)
MCH: 31.3 pg (ref 26.0–34.0)
MCHC: 32.8 g/dL (ref 30.0–36.0)
MCV: 95.3 fL (ref 80.0–100.0)
Monocytes Absolute: 0.4 10*3/uL (ref 0.1–1.0)
Monocytes Relative: 9 %
Neutro Abs: 2.6 10*3/uL (ref 1.7–7.7)
Neutrophils Relative %: 52 %
Platelets: 166 10*3/uL (ref 150–400)
RBC: 3.42 MIL/uL — ABNORMAL LOW (ref 3.87–5.11)
RDW: 12.6 % (ref 11.5–15.5)
WBC: 4.8 10*3/uL (ref 4.0–10.5)
nRBC: 0 % (ref 0.0–0.2)

## 2023-05-08 LAB — BASIC METABOLIC PANEL
Anion gap: 9 (ref 5–15)
BUN: 32 mg/dL — ABNORMAL HIGH (ref 8–23)
CO2: 22 mmol/L (ref 22–32)
Calcium: 8.3 mg/dL — ABNORMAL LOW (ref 8.9–10.3)
Chloride: 109 mmol/L (ref 98–111)
Creatinine, Ser: 2.45 mg/dL — ABNORMAL HIGH (ref 0.44–1.00)
GFR, Estimated: 21 mL/min — ABNORMAL LOW (ref >60)
Glucose, Bld: 106 mg/dL — ABNORMAL HIGH (ref 70–99)
Potassium: 3.1 mmol/L — ABNORMAL LOW (ref 3.5–5.1)
Sodium: 140 mmol/L (ref 135–145)

## 2023-05-08 LAB — GLUCOSE, CAPILLARY
Glucose-Capillary: 106 mg/dL — ABNORMAL HIGH (ref 70–99)
Glucose-Capillary: 89 mg/dL (ref 70–99)

## 2023-05-08 LAB — MAGNESIUM: Magnesium: 1.8 mg/dL (ref 1.7–2.4)

## 2023-05-08 MED ORDER — AMLODIPINE BESYLATE 10 MG PO TABS
10.0000 mg | ORAL_TABLET | Freq: Every day | ORAL | 1 refills | Status: DC
Start: 2023-05-08 — End: 2024-04-03

## 2023-05-08 MED ORDER — HYDRALAZINE HCL 50 MG PO TABS
75.0000 mg | ORAL_TABLET | Freq: Three times a day (TID) | ORAL | Status: DC
  Administered 2023-05-08: 75 mg via ORAL
  Filled 2023-05-08: qty 1

## 2023-05-08 MED ORDER — POTASSIUM CHLORIDE CRYS ER 20 MEQ PO TBCR
40.0000 meq | EXTENDED_RELEASE_TABLET | ORAL | Status: AC
  Administered 2023-05-08 (×2): 40 meq via ORAL
  Filled 2023-05-08 (×2): qty 2

## 2023-05-08 MED ORDER — HYDROCORTISONE (PERIANAL) 2.5 % EX CREA: TOPICAL_CREAM | Freq: Three times a day (TID) | CUTANEOUS | 0 refills | Status: AC

## 2023-05-08 MED ORDER — METOPROLOL TARTRATE 50 MG PO TABS: 50.0000 mg | ORAL_TABLET | Freq: Two times a day (BID) | ORAL | Status: DC

## 2023-05-08 MED ORDER — HYDRALAZINE HCL 25 MG PO TABS: 75.0000 mg | ORAL_TABLET | Freq: Three times a day (TID) | ORAL | 1 refills | Status: DC

## 2023-05-08 MED ORDER — HYDROCORTISONE (PERIANAL) 2.5 % EX CREA
TOPICAL_CREAM | Freq: Three times a day (TID) | CUTANEOUS | Status: DC
  Filled 2023-05-08: qty 28.35

## 2023-05-08 NOTE — Progress Notes (Signed)
Pt's daughter picked up pt for discharge.  Pt did try to arrange transportation with her PCP.  [facility] did call and say they were coming to take her home.  Daughter and pt started yelling at each other while walking out and daughter told her to come on or she was leaving her.  [facility] for transportation called that she was on the way, but was notified that she already left with daughter.

## 2023-05-08 NOTE — Plan of Care (Signed)

## 2023-05-08 NOTE — Progress Notes (Signed)
Patient had been very irate during the night.  Very frustrated with her IV, the VS being taken, the frequency of labs, etc.  She threatened to leave AMA several times during the night.  At shift time, she came into the hall and wanted her PIV removed now so she could leave.  Once I took her PIV out, she wanted the MD called now so she could see him and get her discharge papers.  I paged Dr. Janee Morn.  When I informed the patient, she asked if I could tell her what was happening with her care.  She stated "No one tells me anything".  After reviewing the notes and labs with her, I feel it is more that she did not understand what was happening and why.  She did admit she did not understand a lot of what was happening.  She is now agreeable to staying and following the plan of care.  She did ask if she go outside for fresh air for a couple of minutes.  Per Dr. Janee Morn, ok to do so if supervised.  Patient is agreeable to this.  VS obtained prior to patient leaving the floor with the NT so Dr. Janee Morn could review.  He will review and see patient when he is able.  The patient understands this.  Bernie Covey RN

## 2023-05-08 NOTE — Discharge Summary (Signed)
Physician Discharge Summary  CHRISSIE MCBURNEY ZOX:[id number] DOB: March 04, 1956 DOA: 05/05/2023  PCP: System, Provider Not In  Admit date: 05/05/2023 Discharge date: 05/08/2023  Time spent: 60 minutes  Recommendations for Outpatient Follow-up:  Follow-up with PCP in 1 week.  On follow-up patient's blood pressure need to be reassessed and antihypertensive medications adjusted accordingly.  Patient will need a basic metabolic profile done to follow-up on electrolytes and renal function. Follow-up with [facility]/nephrology in 1 week.  Patient instructed to call on Tuesday, 05/10/2023 to follow-up and make an appointment.   Discharge Diagnoses:  Principal Problem:   AKI (acute kidney injury) (HCC) Active Problems:   DM (diabetes mellitus), type 2 (HCC)   Essential hypertension   S/P ascending aortic replacement   CKD (chronic kidney disease) stage 4, GFR 15-29 ml/min (HCC)   HLD (hyperlipidemia)   ARF (acute renal failure) (HCC)   Hypokalemia   Discharge Condition: Stable and improved  Diet recommendation: Heart healthy  There were no vitals filed for this visit.  History of present illness:  HPI per Dr. Smith Robert TALAYA WIEBKE is a 67 y.o. female with history of chronic kidney disease stage IV, hypertension, diabetes mellitus type 2, hyperlipidemia, status post aortic dissection surgery had routine labs done at her primary care's office and was found to have elevated creatinine was advised to come to the ER.  Patient states about last week she had 3 or 4 episodes of diarrhea.  Which was self-limited.  Denies any nausea or vomiting.  Does not take any NSAIDs.   ED Course: In the ER patient's creatinine was found to be 4.2.  Patient's last creatinine was 3.02 on 12/2021.  Urine shows mild proteinuria.  Patient was given 2 L fluid bolus and admitted for acute on chronic kidney disease.  Patient is making urine.  Hospital Course:  #1 AKI on CKD stage IV -Baseline creatinine  approximately 2.58 October 2023 -Patient noted to have presented with creatinine elevated at 4.25 on admission in the setting of recent diarrhea. -Likely secondary to prerenal azotemia. -Renal ultrasound with bilateral increased renal echogenicity secondary to medical renal disease, no evidence of hydronephrosis. -Patient placed on IV fluids, urine output not properly recorded. -Renal function slowly improved during the hospitalization such that by day of discharge creatinine was back to baseline at 2.45 from 4.25 on admission.  -Renal artery duplex ordered as requested from PCP as well as Aldo renin. -See discussion on renal artery duplex results in #2. -Patient noted to have had a referral made to nephrology in the outpatient setting and patient will need to follow-up with nephrology.   2.  Uncontrolled hypertension -Likely secondary to noncompliance. -Patient states was on antihypertensive medication that did not make her feel well enough and as such was not taking it as prescribed. -Patient during the hospitalization was tolerating current blood pressure medications. -BP improving. -Patient maintained on Norvasc 10 mg daily, metoprolol decreased to 50 mg twice daily due to concerns for bradycardia, hydralazine was increased to 75 mg 3 times daily for better blood pressure control.  -Renal ultrasound done showed bilateral increased renal echogenicity which may be secondary to medical renal disease.  Multiple bilateral simple renal cysts noted.   -Renal artery duplex ordered with no evidence of right renal artery stenosis, increased echogenicity of renal parenchyma, left renal artery appears occluded.  No arterial flow appreciated within the kidney..  Aldosterone renin also ordered and pending at time of discharge. 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  Recommended  outpatient follow-up with nephrology. -Importance of medication compliance stressed to patient. -Outpatient follow-up with PCP and nephrology.   3.  Well-controlled diabetes mellitus type 2 -Hemoglobin A1c 5.3 (05/06/2023) -Patient maintained on sliding scale insulin during the hospitalization.    4.  Hypokalemia -Repleted. -Outpatient follow-up.   5.  Hyperlipidemia -Patient maintained on home regimen Lipitor.   6.  Peripheral neuropathy -Patient maintained on home regimen Neurontin.   7.  History of aortic aneurysmal repair -Patient maintained on home regimen aspirin, Lipitor as well as a beta-blocker.     Procedures: Renal ultrasound 05/06/2023 Renal artery duplex 05/07/2023  Consultations: Curb sided nephrology  Discharge Exam: Vitals:   05/08/23 1022 05/08/23 1201  BP: (!) 170/98 (!) 152/91  Pulse:  66  Resp:    Temp:    SpO2:      General: NAD Cardiovascular: RRR no murmurs rubs or gallops.  No JVD.  No lower extremity edema. Respiratory: Clear to auscultation bilaterally.  No wheezes, no crackles, no rhonchi.  Fair air movement.  Speaking in full sentences.  Discharge Instructions   Discharge Instructions     Diet - low sodium heart healthy   Complete by: As directed    Increase activity slowly   Complete by: As directed       Allergies as of 05/08/2023   No Known Allergies      Medication List     STOP taking these medications    busPIRone 10 MG tablet Commonly known as: BUSPAR   calcium carbonate 1500 (600 Ca) MG Tabs tablet Commonly known as: OSCAL   DRY MOUTH SPRAY MT   magnesium oxide 400 (240 Mg) MG tablet Commonly known as: MAG-OX   melatonin 3 MG Tabs tablet   mirtazapine 7.5 MG tablet Commonly known as: REMERON   Vitamin D-3 125 MCG (5000 UT) Tabs       TAKE these medications    acetaminophen 500 MG tablet Commonly known as: TYLENOL Take 1,000 mg by mouth every 8 (eight) hours as needed for moderate pain.    amLODipine 10 MG tablet Commonly known as: NORVASC Take 1 tablet (10 mg total) by mouth daily.   aspirin EC 325 MG tablet Take 1 tablet (325 mg total) by mouth daily.   atorvastatin 10 MG tablet Commonly known as: LIPITOR Take 1 tablet (10 mg total) by mouth daily.   diclofenac Sodium 1 % Gel Commonly known as: VOLTAREN Apply 2-4 g topically 4 (four) times daily as needed (for bilateral foot pain).   freestyle lancets Check BS twice daily before meals, 250.00   gabapentin 300 MG capsule Commonly known as: NEURONTIN Take 300 mg by mouth in the morning. What changed: Another medication with the same name was removed. Continue taking this medication, and follow the directions you see here.   Glucerna Liqd Take 237 mLs by mouth 2 (two) times daily between meals.   glucose blood test strip Check BS twice daily, 250.00   glucose monitoring kit monitoring kit 1 each by Does not apply route as needed for other. Please check BS twice daily before meals, 250.00   hydrALAZINE 25 MG tablet Commonly known as: APRESOLINE Take 3 tablets (75 mg total) by mouth 3 (three) times daily. What changed: how much to take   hydrocortisone 2.5 % rectal cream Commonly known as: ANUSOL-HC Place rectally 3 (three) times daily  for 7 days.   metoprolol tartrate 50 MG tablet Commonly known as: LOPRESSOR Take 1 tablet (50 mg total) by mouth 2 (two) times daily. What changed: how much to take       No Known Allergies  Follow-up Information     PCP. Schedule an appointment as soon as possible for a visit in 1 week(s).   Why: f/u in 1-2 weeks.        Pa, [facility] Follow up in 1 week(s).   Why: Call on Tuesday, 05/10/2023 to make an appointment to be seen within 1-2 weeks. Contact information: [address] [place] [address] [phone]                  The results of significant diagnostics from this hospitalization (including imaging, microbiology,  ancillary and laboratory) are listed below for reference.    Significant Diagnostic Studies: VAS US RENAL ARTERY DUPLEX  Result Date: 05/07/2023 ABDOMINAL VISCERAL Patient Name:  ISHMEET CABACUNGAN  Date of Exam:   05/07/2023 Medical Rec #: [id number]       Accession #:    [id number] Date of Birth: [birth year]       Patient Gender: F Patient Age:   67 years Exam Location:  [facility] Procedure:      VAS US RENAL ARTERY DUPLEX Referring Phys: [id number] [facility] DAHAL -------------------------------------------------------------------------------- Indications: AKI High Risk Factors: Hypertension, hyperlipidemia, current smoker. Other Factors: History of aortic dissection 2021. Comparison Study: 12-02-2020 CTA The left renal artery appears to arise from the                   false lumen and abruptly tapers (7/159) with absent                   enhancement of much of the renal parenchyma save a small                   amount of preserved upper pole enhancement supplied by a tiny                   accessory renal artery. Performing Technologist: Jean Rosenthal RDMS, RVT  Examination Guidelines: A complete evaluation includes B-mode imaging, spectral Doppler, color Doppler, and power Doppler as needed of all accessible portions of each vessel. Bilateral testing is considered an integral part of a complete examination. Limited examinations for reoccurring indications may be performed as noted.  Duplex Findings: +----------------------+--------+--------+------+--------+ Mesenteric            PSV cm/sEDV cm/sPlaqueComments +----------------------+--------+--------+------+--------+ Aorta Mid                53                          +----------------------+--------+--------+------+--------+ Celiac Artery Proximal   99                          +----------------------+--------+--------+------+--------+ SMA Proximal            134                           +----------------------+--------+--------+------+--------+    +------------------+--------+--------+-------+ Right Renal ArteryPSV cm/sEDV cm/sComment +------------------+--------+--------+-------+ Origin               46      14           +------------------+--------+--------+-------+  Proximal             53      15           +------------------+--------+--------+-------+ Mid                  57      15           +------------------+--------+--------+-------+ Distal               47      10           +------------------+--------+--------+-------+ +-----------------+--------+--------+----------------+ Left Renal ArteryPSV cm/sEDV cm/s    Comment      +-----------------+--------+--------+----------------+ Origin                           Appears occluded +-----------------+--------+--------+----------------+ Proximal                         Appears occluded +-----------------+--------+--------+----------------+ Mid                              Appears occluded +-----------------+--------+--------+----------------+ +------------+--------+--------+----+-----------+--------+--------+---+ Right KidneyPSV cm/sEDV cm/sRI  Left KidneyPSV cm/sEDV cm/sRI  +------------+--------+--------+----+-----------+--------+--------+---+ Upper Pole  14      6       0.60Upper Pole                     +------------+--------+--------+----+-----------+--------+--------+---+ Mid         15      5       0.                            +------------+--------+--------+----+-----------+--------+--------+---+ Lower Pole  19      6       0.70Lower Pole                     +------------+--------+--------+----+-----------+--------+--------+---+ Hilar       48      12      0.75Hilar                          +------------+--------+--------+----+-----------+--------+--------+---+ +------------------+-----+------------------+-----+ Right Kidney           Left  Kidney             +------------------+-----+------------------+-----+ RAR                    RAR                     +------------------+-----+------------------+-----+ RAR (manual)      1.1  RAR (manual)            +------------------+-----+------------------+-----+ Cortex                 Cortex                  +------------------+-----+------------------+-----+ Cortex thickness       Corex thickness         +------------------+-----+------------------+-----+ Kidney length (cm)11.04Kidney length (cm)10.82 +------------------+-----+------------------+-----+  Summary: Renal:  Right: No evidence of right renal artery stenosis. RRV flow present.        Cyst(s) noted. Normal right Resisitive Index. Increased        echogenicity of renal parenchyma. Left:  LRV flow present. Cyst(s) noted. Increased echogenicity of  renal parenchyma. Left renal artery appears occluded. No        arterial flow appreciated within the kidney. Mesenteric: Normal Celiac artery and Superior Mesenteric artery findings.  *See table(s) above for measurements and observations.  Diagnosing physician: Waverly Ferrari MD  Electronically signed by Waverly Ferrari MD on 05/07/2023 at 7:57:14 PM.    Final    US RENAL  Result Date: 05/06/2023 CLINICAL DATA:  Acute renal failure. EXAM: RENAL / URINARY TRACT ULTRASOUND COMPLETE COMPARISON:  None Available. FINDINGS: Right Kidney: Renal measurements: 10.2 cm x 4.2 cm x 4.7 cm = volume: 105.55 mL. Diffusely increased echogenicity of the renal parenchyma is noted. A 1.5 cm x 1.4 cm x 1.3 cm simple cyst is seen within the upper pole of the right kidney. No hydronephrosis visualized. Left Kidney: Renal measurements: 6.9 cm x 3.6 cm x 3.2 cm = volume: 42.53 mL. Diffusely increased echogenicity of the renal parenchyma is noted. Multiple simple left renal cysts are seen. The largest measures approximately 2.3 cm x 1.8 cm x 1.6 cm and is seen within the upper pole of the  left kidney. No hydronephrosis visualized. Bladder: Appears normal for degree of bladder distention. Other: None. IMPRESSION: 1. Bilateral increased renal echogenicity which may be secondary to medical renal disease. 2. Multiple bilateral simple renal cysts. Electronically Signed   By: Aram Candela M.D.   On: 05/06/2023 03:36    Microbiology: No results found for this or any previous visit (from the past 240 hour(s)).   Labs: Basic Metabolic Panel: Recent Labs  Lab 05/05/23 1709 05/06/23 0321 05/07/23 0141 05/07/23 0146 05/08/23 0359  NA 142 143  --  143 140  K 3.1* 3.2*  --  3.2* 3.1*  CL 107 107  --  112* 109  CO2 24 27  --  21* 22  GLUCOSE 100* 142*  --  102* 106*  BUN 50* 46*  --  37* 32*  CREATININE 4.25* 3.69*  --  2.87* 2.45*  CALCIUM 8.6* 8.5*  --  8.5* 8.3*  MG  --   --  1.8  --  1.8   Liver Function Tests: Recent Labs  Lab 05/05/23 1709 05/06/23 0321  AST 15 13*  ALT 9 9  ALKPHOS 54 47  BILITOT 0.7 0.4  PROT 6.7 5.9*  ALBUMIN 3.4* 3.0*   Recent Labs  Lab 05/05/23 1709  LIPASE 30   No results for input(s): "AMMONIA" in the last 168 hours. CBC: Recent Labs  Lab 05/05/23 1709 05/06/23 0321 05/07/23 0146 05/08/23 0359  WBC 4.2 4.7 5.0 4.8  NEUTROABS  --  2.4 2.4 2.6  HGB 12.1 10.4* 11.4* 10.7*  HCT 36.6 31.2* 35.1* 32.6*  MCV 98.7 95.1 98.0 95.3  PLT 192 165 170 166   Cardiac Enzymes: Recent Labs  Lab 05/06/23 0321  CKTOTAL 127   BNP: BNP (last 3 results) No results for input(s): "BNP" in the last 8760 hours.  ProBNP (last 3 results) No results for input(s): "PROBNP" in the last 8760 hours.  CBG: Recent Labs  Lab 05/07/23 0746 05/07/23 1126 05/07/23 1608 05/08/23 0712 05/08/23 1120  GLUCAP 92 87 88 106* 89       Signed:  Ramiro Harvest MD.  [facility] 05/08/2023, 1:57 PM

## 2023-05-11 ENCOUNTER — Other Ambulatory Visit: Payer: Self-pay | Admitting: Vascular Surgery

## 2023-05-11 DIAGNOSIS — Z1231 Encounter for screening mammogram for malignant neoplasm of breast: Secondary | ICD-10-CM

## 2023-05-12 LAB — ALDOSTERONE + RENIN ACTIVITY W/ RATIO
ALDO / PRA Ratio: 28.6 (ref 0.0–30.0)
Aldosterone: 6 ng/dL (ref 0.0–30.0)
PRA LC/MS/MS: 0.21 ng/mL/hr (ref 0.167–5.380)

## 2023-10-17 ENCOUNTER — Other Ambulatory Visit: Payer: Self-pay | Admitting: *Deleted

## 2023-10-17 DIAGNOSIS — M79606 Pain in leg, unspecified: Secondary | ICD-10-CM

## 2023-10-24 ENCOUNTER — Encounter: Payer: Self-pay | Admitting: Surgery

## 2023-10-24 ENCOUNTER — Other Ambulatory Visit: Payer: Self-pay | Admitting: Thoracic Surgery (Cardiothoracic Vascular Surgery)

## 2023-10-24 ENCOUNTER — Ambulatory Visit (INDEPENDENT_AMBULATORY_CARE_PROVIDER_SITE_OTHER): Payer: Medicare (Managed Care) | Admitting: Surgery

## 2023-10-24 ENCOUNTER — Ambulatory Visit (HOSPITAL_COMMUNITY)
Admission: RE | Admit: 2023-10-24 | Discharge: 2023-10-24 | Disposition: A | Discharge status: Home / Self Care | Payer: Medicare (Managed Care) | Source: Ambulatory Visit | Attending: Surgery | Admitting: Surgery

## 2023-10-24 VITALS — BP 145/90 | HR 66 | Temp 98.2°F | Resp 20 | Ht 68.0 in | Wt 142.0 lb

## 2023-10-24 DIAGNOSIS — I70213 Atherosclerosis of native arteries of extremities with intermittent claudication, bilateral legs: Secondary | ICD-10-CM

## 2023-10-24 DIAGNOSIS — M79606 Pain in leg, unspecified: Secondary | ICD-10-CM | POA: Diagnosis not present

## 2023-10-24 DIAGNOSIS — I7101 Dissection of ascending aorta: Secondary | ICD-10-CM

## 2023-10-24 LAB — VAS US ABI WITH/WO TBI
Left ABI: 1.17
Right ABI: 1.08

## 2023-10-24 NOTE — Progress Notes (Signed)
[facility] [place]  Patient name: Mary Stephens MRN: [id number] DOB: July 12, 1956 Sex: female   REQUESTING PROVIDER:    Tonye Becket claudication   REASON FOR CONSULT:    claudication  HISTORY OF PRESENT ILLNESS:   Mary Stephens is a 67 y.o. female, who is referred for evaluation of leg pain.  She states that she will wake up in the middle of the night with severe cramps.  These have been getting worse.  She states that she does not get cramps with walking.  She will take a teaspoon of mustard that does help with her cramping.  She denies any leg swelling.  She does not have any open wounds.  The patient has a history of a type a aortic dissection repair by Dr. Cliffton Asters in 2021.  She is a diabetic which is diet controlled.  She has a history of hepatitis C.  She is a current smoker.  She is medically managed for hypertension.  She takes a statin for hypercholesterolemia.   PAST MEDICAL HISTORY    Past Medical History:  Diagnosis Date   Anxiety    Arthritis    Cataract    forming- very small   Colon polyps    Depression    Diabetes mellitus without complication (HCC)    off meds for diabetes- diet controlled   GERD (gastroesophageal reflux disease)    H/O aortic dissection 2021   Hepatitis C    Hyperlipidemia    Hypertension    Neuropathy    feet     FAMILY HISTORY   Family History  Problem Relation Age of Onset   Diabetes Mother    Colon cancer Neg Hx    Colon polyps Neg Hx    Esophageal cancer Neg Hx    Rectal cancer Neg Hx    Stomach cancer Neg Hx     SOCIAL HISTORY:   Social History   Socioeconomic History   Marital status: Single    Spouse name: Not on file   Number of children: 1   Years of education: Not on file   Highest education level: Not on file  Occupational History   Occupation: unemployed  Tobacco Use   Smoking status: Every Day    Current packs/day: 0.25    Types:  Cigarettes   Smokeless tobacco: Never   Tobacco comments:    admits to cuttting back 4 cigarettes/day  Vaping Use   Vaping status: Never Used  Substance and Sexual Activity   Alcohol use: Yes    Alcohol/week: 1.0 standard drink of alcohol    Types: 1 Standard drinks or equivalent per week    Comment: wine cooler   Drug use: Not Currently    Types: Marijuana   Sexual activity: Not on file  Other Topics Concern   Not on file  Social History Narrative   Not on file   Social Determinants of Health   Financial Resource Strain: Not on file  Food Insecurity: Not on file  Transportation Needs: Not on file  Physical Activity: Not on file  Stress: Not on file  Social Connections: Not on file  Intimate Partner Violence: Not on file    ALLERGIES:    No Known Allergies  CURRENT MEDICATIONS:    Current Outpatient Medications  Medication Sig Dispense Refill   acetaminophen (TYLENOL) 500 MG tablet Take 1,000 mg by mouth every 8 (eight) hours as needed for moderate pain.     amLODipine (NORVASC) 10  MG tablet Take 1 tablet (10 mg total) by mouth daily. 30 tablet 1   aspirin EC 325 MG EC tablet Take 1 tablet (325 mg total) by mouth daily. 30 tablet 0   atorvastatin (LIPITOR) 10 MG tablet Take 1 tablet (10 mg total) by mouth daily. 90 tablet 3   diclofenac Sodium (VOLTAREN) 1 % GEL Apply 2-4 g topically 4 (four) times daily as needed (for bilateral foot pain).     gabapentin (NEURONTIN) 300 MG capsule Take 300 mg by mouth in the morning.     Glucerna (GLUCERNA) LIQD Take 237 mLs by mouth 2 (two) times daily between meals.     glucose blood test strip Check BS twice daily, 250.00 100 each 12   glucose monitoring kit (FREESTYLE) monitoring kit 1 each by Does not apply route as needed for other. Please check BS twice daily before meals, 250.00 1 each 0   hydrALAZINE (APRESOLINE) 25 MG tablet Take 3 tablets (75 mg total) by mouth 3 (three) times daily. 270 tablet 1   Lancets (FREESTYLE)  lancets Check BS twice daily before meals, 250.00 100 each 12   metoprolol tartrate (LOPRESSOR) 50 MG tablet Take 1 tablet (50 mg total) by mouth 2 (two) times daily.     No current facility-administered medications for this visit.    REVIEW OF SYSTEMS:   [X]  denotes positive finding, [ ]  denotes negative finding Cardiac  Comments:  Chest pain or chest pressure:    Shortness of breath upon exertion:    Short of breath when lying flat:    Irregular heart rhythm:        Vascular    Pain in calf, thigh, or hip brought on by ambulation:    Pain in feet at night that wakes you up from your sleep:     Blood clot in your veins:    Leg swelling:         Pulmonary    Oxygen at home:    Productive cough:     Wheezing:         Neurologic    Sudden weakness in arms or legs:     Sudden numbness in arms or legs:     Sudden onset of difficulty speaking or slurred speech:    Temporary loss of vision in one eye:     Problems with dizziness:         Gastrointestinal    Blood in stool:      Vomited blood:         Genitourinary    Burning when urinating:     Blood in urine:        Psychiatric    Major depression:         Hematologic    Bleeding problems:    Problems with blood clotting too easily:        Skin    Rashes or ulcers:        Constitutional    Fever or chills:     PHYSICAL EXAM:   Vitals:   10/24/23 1038  BP: (!) 145/90  Pulse: 66  Resp: 20  Temp: 98.2 F (36.8 C)  SpO2: 96%  Weight: 142 lb (64.4 kg)  Height: 5\' 8"  (1.727 m)    GENERAL: The patient is a well-nourished female, in no acute distress. The vital signs are documented above. CARDIAC: There is a regular rate and rhythm.  VASCULAR: Palpable posterior tibial pulses PULMONARY: Nonlabored respirations ABDOMEN: Soft and non-tender MUSCULOSKELETAL:  There are no major deformities or cyanosis. NEUROLOGIC: No focal weakness or paresthesias are detected. SKIN: There are no ulcers or rashes  noted. PSYCHIATRIC: The patient has a normal affect.  STUDIES:   I have reviewed the following: ABI/TBIToday's ABIToday's TBIPrevious ABIPrevious TBI  +-------+-----------+-----------+------------+------------+  Right 1.08       0.75                                 +-------+-----------+-----------+------------+------------+  Left  1.17       0.79                                 +-------+-----------+-----------+------------+------------+  Right toe pressure: 128 Left toe pressure: 133 All waveforms are triphasic   ASSESSMENT and PLAN   Leg cramps: I discussed with the patient that she has normal vascular lab studies and palpable pedal pulses.  There is no sign of arterial insufficiency.  The etiology of her leg cramps is not from arterial insufficiency.  I have encouraged her to try and stay hydrated and to take multivitamins.  She can continue taking mustard for her leg cramps.   Charlena Cross, MD, FACS [facility] [phone] Pager [phone]

## 2023-11-03 ENCOUNTER — Other Ambulatory Visit: Payer: Self-pay | Admitting: Vascular Surgery

## 2023-11-03 DIAGNOSIS — I1A0 Resistant hypertension: Secondary | ICD-10-CM

## 2023-11-15 ENCOUNTER — Other Ambulatory Visit: Payer: Medicare (Managed Care)

## 2023-11-22 ENCOUNTER — Ambulatory Visit: Payer: Medicare (Managed Care)

## 2023-11-22 ENCOUNTER — Other Ambulatory Visit: Payer: Medicare (Managed Care)

## 2023-12-06 NOTE — Progress Notes (Deleted)
      [address]       [place] [address]             [phone]        [name] [id number] 06/19/1956  History of Present Illness:  Mary Stephens is a 68 yo female with history of Aortic Dissection S/P Repair by Dr. Cliffton Asters in 2021, HTN, HLD, Type DM, Tobacco Abuse, Chronic Hepatitis C.  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  She presents today for 1 year follow up and surveillance.     Current Outpatient Medications on File Prior to Visit  Medication Sig Dispense Refill   acetaminophen (TYLENOL) 500 MG tablet Take 1,000 mg by mouth every 8 (eight) hours as needed for moderate pain.     amLODipine (NORVASC) 10 MG tablet Take 1 tablet (10 mg total) by mouth daily. 30 tablet 1   aspirin EC 325 MG EC tablet Take 1 tablet (325 mg total) by mouth daily. 30 tablet 0   atorvastatin (LIPITOR) 10 MG tablet Take 1 tablet (10 mg total) by mouth daily. 90 tablet 3   diclofenac Sodium (VOLTAREN) 1 % GEL Apply 2-4 g topically 4 (four) times daily as needed (for bilateral foot pain).     gabapentin (NEURONTIN) 300 MG capsule Take 300 mg by mouth in the morning.     Glucerna (GLUCERNA) LIQD Take 237 mLs by mouth 2 (two) times daily between meals.     glucose blood test strip Check BS twice daily, 250.00 100 each 12   glucose monitoring kit (FREESTYLE) monitoring kit 1 each by Does not apply route as needed for other. Please check BS twice daily before meals, 250.00 1 each 0   hydrALAZINE (APRESOLINE) 25 MG tablet Take 3 tablets (75 mg total) by mouth 3 (three) times daily. 270 tablet 1   Lancets (FREESTYLE) lancets Check BS twice daily before meals, 250.00 100 each 12   metoprolol tartrate (LOPRESSOR) 50 MG tablet Take 1 tablet (50 mg total) by mouth 2 (two) times daily.     [DISCONTINUED] glipiZIDE (GLUCOTROL XL) 5 MG 24 hr tablet Take 1 tablet (5 mg total) by mouth daily with breakfast. (Patient not taking: Reported on  06/20/2020) 90 tablet 3   No current facility-administered medications on file prior to visit.     There were no vitals taken for this visit.  Physical Exam  CTA Results:    A/P:  S/P Aortic Dissection Repair 2021.. 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  Despite Medical intervention if the patient notices persistently elevated blood pressure readings.  They are instructed to contact their Primary Care Physician  Please avoid use of Fluoroquinolones as this can potentially increase your risk of Aortic Rupture and/or Dissection  Patient educated on signs and symptoms of Aortic Dissection, handout also provided in AVS  Maximilian Tallo, PA-C 12/06/23

## 2023-12-09 ENCOUNTER — Ambulatory Visit
Admission: RE | Admit: 2023-12-09 | Discharge: 2023-12-09 | Disposition: A | Discharge status: Home / Self Care | Payer: Medicare (Managed Care) | Source: Ambulatory Visit | Attending: Thoracic Surgery (Cardiothoracic Vascular Surgery) | Admitting: Thoracic Surgery (Cardiothoracic Vascular Surgery)

## 2023-12-09 ENCOUNTER — Other Ambulatory Visit: Payer: Self-pay | Admitting: Thoracic Surgery (Cardiothoracic Vascular Surgery)

## 2023-12-09 DIAGNOSIS — I7101 Dissection of ascending aorta: Secondary | ICD-10-CM

## 2023-12-20 ENCOUNTER — Ambulatory Visit: Payer: Medicare (Managed Care)

## 2023-12-21 NOTE — Progress Notes (Signed)
 [address]       [place] [address]             [phone]        Mary Stephens [id number] Apr 12, 1956  History of Present Illness:  Mary Stephens is a 68 yo female with a past medical history of HTN, HLD, T2DM, aortic dissection, tobacco abuse, and chronic Hepatitis C. She is S/P Repair of Aortic Dissection by Dr. Deloise Ferries in December 2021. 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       Current Outpatient Medications on File Prior to Visit  Medication Sig Dispense Refill   acetaminophen  (TYLENOL ) 500 MG tablet Take 1,000 mg by mouth every 8 (eight) hours as needed for moderate pain.     amLODipine  (NORVASC ) 10 MG tablet Take 1 tablet (10 mg total) by mouth daily. 30 tablet 1   aspirin  EC 325 MG EC tablet Take 1 tablet (325 mg total) by mouth daily. 30 tablet 0   atorvastatin  (LIPITOR) 10 MG tablet Take 1 tablet (10 mg total) by mouth daily. 90 tablet 3   diclofenac  Sodium (VOLTAREN ) 1 % GEL Apply 2-4 g topically 4 (four) times daily as needed (for bilateral foot pain).     gabapentin  (NEURONTIN ) 300 MG capsule Take 300 mg by mouth in the morning.     Glucerna (GLUCERNA) LIQD Take 237 mLs by mouth 2 (two) times daily between meals.     glucose blood test strip Check BS twice daily, 250.00 100 each 12   glucose monitoring kit (FREESTYLE) monitoring kit 1 each by Does not apply route as needed for other. Please check BS twice daily before meals, 250.00 1 each 0   hydrALAZINE  (APRESOLINE ) 25 MG tablet Take 3 tablets (75 mg total) by mouth 3 (three) times daily. 270 tablet 1   Lancets (FREESTYLE) lancets Check BS twice daily before meals, 250.00 100 each 12   metoprolol  tartrate (LOPRESSOR ) 50 MG tablet Take 1 tablet (50 mg total) by mouth 2 (two) times daily.     [DISCONTINUED] glipiZIDE  (GLUCOTROL  XL) 5 MG 24 hr tablet Take 1 tablet (5 mg total) by mouth  daily with breakfast. (Patient not taking: Reported on 06/20/2020) 90 tablet 3   No current facility-administered medications on file prior to visit.     There were no vitals taken for this visit.  Physical Exam  CTA Results:  FINDINGS: Cardiovascular: Proximal and distal anastomoses the ascending thoracic graft appear intact without obvious enlarging protrusion to suggest anastomotic pseudoaneurysm by unenhanced CT. Stable appearance the aortic root.   Distal to the aortic graft, aneurysmal disease involving the aortic arch remains present with the proximal arch demonstrating stable maximal caliber of approximately 4 cm. Dilatation of the proximal descending thoracic aorta shows some interval enlargement measuring 4.2 cm currently compared to 3.9 cm previously and 3.5 cm in 2022. Mid descending segment measures approximately 4.2 cm compared to 3.8 cm previously. The distal descending thoracic aorta measures 3.5 cm compared to approximately 3.4 cm previously.   Stable cardiac enlargement. Stable calcified coronary artery plaque. No significant pericardial fluid. Stable appearance central pulmonary artery dilatation.   Mediastinum/Nodes: No enlarged mediastinal or axillary lymph nodes. The trachea and esophagus demonstrate no significant findings. Stable focal calcification within a mildly enlarged right lobe of the thyroid gland.   Lungs/Pleura: Stable scarring and clustered nodularity  in the lateral left upper lobe since 2022. This is likely postinflammatory and benign given stability for more than 2 years. Stable calcified granuloma in the right middle lobe. There is no evidence of pulmonary edema, consolidation, pneumothorax or pleural fluid.   Upper Abdomen: Stable enlarged and low-density bilateral adrenal glands likely reflecting bilateral adrenal hyperplasia. Bilateral renal cysts again noted, some of which are hemorrhagic. In the lateral aspect of the upper to mid  left kidney, a potentially enlarging complex abnormality is identified anterior to a cyst and measuring roughly 2.2 x 2.7 cm in greatest transverse dimensions. Although this could also potentially represent an enlarging complex or hemorrhagic cyst, a solid mass is not excluded and further evaluation surveillance is warranted.   Musculoskeletal: No chest wall mass or suspicious bone lesions identified.   IMPRESSION: 1. Status post ascending thoracic aortic grafting without evidence of obvious anastomotic pseudoaneurysm by unenhanced CT. 2. Aneurysmal disease involving the aortic arch and descending thoracic aorta shows some interval enlargement of the descending thoracic aorta since 2022. The proximal descending thoracic aorta has increased from 3.9 cm previously to approximately 4.2 cm. The mid descending thoracic aorta as increased from 3.8 cm previously to approximately 4.2 cm. Most likely etiology of gradual enlargement the non repaired descending thoracic aorta is enlargement of the false lumen after prior dissection. Consider follow-up with MRI of the chest without contrast and MRA without contrast in 6-12 months which would be potentially a better way to follow size of the false lumen. 3. Stable scarring and clustered nodularity in the lateral left upper lobe since 2022. This is likely postinflammatory and benign given stability for more than 2 years. 4. Stable enlarged and low-density bilateral adrenal glands likely reflecting bilateral adrenal hyperplasia. 5. Bilateral renal cysts, some of which are hemorrhagic. In the lateral aspect of the upper to mid left kidney, a potentially enlarging complex abnormality is identified anterior to a cyst and measuring roughly 2.2 x 2.7 cm in greatest transverse dimensions. Although this could also potentially represent an enlarging complex or hemorrhagic cyst, a solid mass is not excluded and further evaluation and surveillance is  warranted. Recommend MRI of the abdomen without contrast in follow-up as the patient cannot receive gadolinium with current status of chronic kidney disease.   Aortic aneurysm NOS (ICD10-I71.9).     Electronically Signed   By: Erica Hau M.D.   On: 12/09/2023 14:54     A/P:  NO SHOW      Risk Modification:  atient was counseled on importance of Blood Pressure Control.  Despite Medical intervention if the patient notices persistently elevated blood pressure readings.  They are instructed to contact their Primary Care Physician  Please avoid use of Fluoroquinolones as this can potentially increase your risk of Aortic Rupture and/or Dissection  Patient educated on signs and symptoms of Aortic Dissection, handout also provided in AVS  Mary Lewey, PA-C 12/21/23

## 2023-12-28 ENCOUNTER — Encounter: Payer: Self-pay | Admitting: Thoracic Surgery (Cardiothoracic Vascular Surgery)

## 2023-12-28 ENCOUNTER — Ambulatory Visit: Payer: Medicare (Managed Care) | Admitting: Physician Assistant

## 2024-03-31 ENCOUNTER — Inpatient Hospital Stay (HOSPITAL_COMMUNITY)
Admission: EM | Admit: 2024-03-31 | Discharge: 2024-04-03 | DRG: 304 | Disposition: A | Discharge status: Home / Self Care | Payer: Medicare (Managed Care) | Attending: Internal Medicine | Admitting: Internal Medicine

## 2024-03-31 ENCOUNTER — Emergency Department (HOSPITAL_COMMUNITY): Payer: Medicare (Managed Care)

## 2024-03-31 DIAGNOSIS — I130 Hypertensive heart and chronic kidney disease with heart failure and stage 1 through stage 4 chronic kidney disease, or unspecified chronic kidney disease: Secondary | ICD-10-CM | POA: Diagnosis present

## 2024-03-31 DIAGNOSIS — F17210 Nicotine dependence, cigarettes, uncomplicated: Secondary | ICD-10-CM | POA: Diagnosis present

## 2024-03-31 DIAGNOSIS — N179 Acute kidney failure, unspecified: Secondary | ICD-10-CM | POA: Diagnosis present

## 2024-03-31 DIAGNOSIS — I712 Thoracic aortic aneurysm, without rupture, unspecified: Secondary | ICD-10-CM | POA: Diagnosis present

## 2024-03-31 DIAGNOSIS — J810 Acute pulmonary edema: Secondary | ICD-10-CM | POA: Diagnosis not present

## 2024-03-31 DIAGNOSIS — I161 Hypertensive emergency: Principal | ICD-10-CM | POA: Diagnosis present

## 2024-03-31 DIAGNOSIS — I509 Heart failure, unspecified: Secondary | ICD-10-CM | POA: Diagnosis not present

## 2024-03-31 DIAGNOSIS — D631 Anemia in chronic kidney disease: Secondary | ICD-10-CM | POA: Diagnosis present

## 2024-03-31 DIAGNOSIS — R0603 Acute respiratory distress: Principal | ICD-10-CM

## 2024-03-31 DIAGNOSIS — E1122 Type 2 diabetes mellitus with diabetic chronic kidney disease: Secondary | ICD-10-CM | POA: Diagnosis present

## 2024-03-31 DIAGNOSIS — E041 Nontoxic single thyroid nodule: Secondary | ICD-10-CM | POA: Diagnosis present

## 2024-03-31 DIAGNOSIS — N189 Chronic kidney disease, unspecified: Secondary | ICD-10-CM | POA: Diagnosis not present

## 2024-03-31 DIAGNOSIS — I2489 Other forms of acute ischemic heart disease: Secondary | ICD-10-CM | POA: Diagnosis present

## 2024-03-31 DIAGNOSIS — R0602 Shortness of breath: Secondary | ICD-10-CM | POA: Diagnosis present

## 2024-03-31 DIAGNOSIS — I5032 Chronic diastolic (congestive) heart failure: Secondary | ICD-10-CM | POA: Diagnosis present

## 2024-03-31 DIAGNOSIS — Z8619 Personal history of other infectious and parasitic diseases: Secondary | ICD-10-CM | POA: Diagnosis not present

## 2024-03-31 DIAGNOSIS — E1140 Type 2 diabetes mellitus with diabetic neuropathy, unspecified: Secondary | ICD-10-CM | POA: Diagnosis present

## 2024-03-31 DIAGNOSIS — J9601 Acute respiratory failure with hypoxia: Secondary | ICD-10-CM | POA: Diagnosis present

## 2024-03-31 DIAGNOSIS — J9602 Acute respiratory failure with hypercapnia: Secondary | ICD-10-CM | POA: Diagnosis present

## 2024-03-31 DIAGNOSIS — Z79899 Other long term (current) drug therapy: Secondary | ICD-10-CM | POA: Diagnosis not present

## 2024-03-31 DIAGNOSIS — I3139 Other pericardial effusion (noninflammatory): Secondary | ICD-10-CM | POA: Diagnosis present

## 2024-03-31 DIAGNOSIS — E7800 Pure hypercholesterolemia, unspecified: Secondary | ICD-10-CM | POA: Diagnosis present

## 2024-03-31 DIAGNOSIS — Z95828 Presence of other vascular implants and grafts: Secondary | ICD-10-CM

## 2024-03-31 DIAGNOSIS — Z833 Family history of diabetes mellitus: Secondary | ICD-10-CM | POA: Diagnosis not present

## 2024-03-31 DIAGNOSIS — Z860100 Personal history of colon polyps, unspecified: Secondary | ICD-10-CM

## 2024-03-31 DIAGNOSIS — Z90711 Acquired absence of uterus with remaining cervical stump: Secondary | ICD-10-CM

## 2024-03-31 DIAGNOSIS — Z7982 Long term (current) use of aspirin: Secondary | ICD-10-CM | POA: Diagnosis not present

## 2024-03-31 DIAGNOSIS — J90 Pleural effusion, not elsewhere classified: Secondary | ICD-10-CM | POA: Diagnosis not present

## 2024-03-31 DIAGNOSIS — Z7984 Long term (current) use of oral hypoglycemic drugs: Secondary | ICD-10-CM

## 2024-03-31 DIAGNOSIS — N184 Chronic kidney disease, stage 4 (severe): Secondary | ICD-10-CM | POA: Diagnosis present

## 2024-03-31 DIAGNOSIS — I21A1 Myocardial infarction type 2: Secondary | ICD-10-CM | POA: Diagnosis present

## 2024-03-31 DIAGNOSIS — I10 Essential (primary) hypertension: Secondary | ICD-10-CM

## 2024-03-31 LAB — BASIC METABOLIC PANEL WITH GFR
Anion gap: 14 (ref 5–15)
BUN: 37 mg/dL — ABNORMAL HIGH (ref 8–23)
CO2: 19 mmol/L — ABNORMAL LOW (ref 22–32)
Calcium: 8.3 mg/dL — ABNORMAL LOW (ref 8.9–10.3)
Chloride: 110 mmol/L (ref 98–111)
Creatinine, Ser: 3.78 mg/dL — ABNORMAL HIGH (ref 0.44–1.00)
GFR, Estimated: 12 mL/min — ABNORMAL LOW (ref >60)
Glucose, Bld: 180 mg/dL — ABNORMAL HIGH (ref 70–99)
Potassium: 3.4 mmol/L — ABNORMAL LOW (ref 3.5–5.1)
Sodium: 143 mmol/L (ref 135–145)

## 2024-03-31 LAB — I-STAT ARTERIAL BLOOD GAS, ED
Acid-base deficit: 8 mmol/L — ABNORMAL HIGH (ref 0.0–2.0)
Acid-base deficit: 4 mmol/L — ABNORMAL HIGH (ref 0.0–2.0)
Bicarbonate: 22.2 mmol/L (ref 20.0–28.0)
Bicarbonate: 22.1 mmol/L (ref 20.0–28.0)
Calcium, Ion: 1.26 mmol/L (ref 1.15–1.40)
Calcium, Ion: 1.2 mmol/L (ref 1.15–1.40)
HCT: 36 % (ref 36.0–46.0)
HCT: 33 % — ABNORMAL LOW (ref 36.0–46.0)
Hemoglobin: 12.2 g/dL (ref 12.0–15.0)
Hemoglobin: 11.2 g/dL — ABNORMAL LOW (ref 12.0–15.0)
O2 Saturation: 94 %
O2 Saturation: 99 %
Patient temperature: 98.7
Patient temperature: 98.6
Potassium: 3.6 mmol/L (ref 3.5–5.1)
Potassium: 3.2 mmol/L — ABNORMAL LOW (ref 3.5–5.1)
Sodium: 143 mmol/L (ref 135–145)
Sodium: 144 mmol/L (ref 135–145)
TCO2: 24 mmol/L (ref 22–32)
TCO2: 23 mmol/L (ref 22–32)
pCO2 arterial: 70 mmHg (ref 32–48)
pCO2 arterial: 44.4 mmHg (ref 32–48)
pH, Arterial: 7.109 — CL (ref 7.35–7.45)
pH, Arterial: 7.306 — ABNORMAL LOW (ref 7.35–7.45)
pO2, Arterial: 99 mmHg (ref 83–108)
pO2, Arterial: 132 mmHg — ABNORMAL HIGH (ref 83–108)

## 2024-03-31 LAB — CBC WITH DIFFERENTIAL/PLATELET
Abs Immature Granulocytes: 0 10*3/uL (ref 0.00–0.07)
Basophils Absolute: 0 10*3/uL (ref 0.0–0.1)
Basophils Relative: 0 %
Eosinophils Absolute: 0.3 10*3/uL (ref 0.0–0.5)
Eosinophils Relative: 2 %
HCT: 36.5 % (ref 36.0–46.0)
Hemoglobin: 11.4 g/dL — ABNORMAL LOW (ref 12.0–15.0)
Lymphocytes Relative: 58 %
Lymphs Abs: 7.5 10*3/uL — ABNORMAL HIGH (ref 0.7–4.0)
MCH: 32.7 pg (ref 26.0–34.0)
MCHC: 31.2 g/dL (ref 30.0–36.0)
MCV: 104.6 fL — ABNORMAL HIGH (ref 80.0–100.0)
Monocytes Absolute: 0.5 10*3/uL (ref 0.1–1.0)
Monocytes Relative: 4 %
Neutro Abs: 4.7 10*3/uL (ref 1.7–7.7)
Neutrophils Relative %: 36 %
Platelets: 193 10*3/uL (ref 150–400)
RBC: 3.49 MIL/uL — ABNORMAL LOW (ref 3.87–5.11)
RDW: 12.9 % (ref 11.5–15.5)
WBC: 13 10*3/uL — ABNORMAL HIGH (ref 4.0–10.5)
nRBC: 0 % (ref 0.0–0.2)
nRBC: 0 /100{WBCs}

## 2024-03-31 LAB — I-STAT CHEM 8, ED
BUN: 48 mg/dL — ABNORMAL HIGH (ref 8–23)
Calcium, Ion: 1.11 mmol/L — ABNORMAL LOW (ref 1.15–1.40)
Chloride: 112 mmol/L — ABNORMAL HIGH (ref 98–111)
Creatinine, Ser: 4.1 mg/dL — ABNORMAL HIGH (ref 0.44–1.00)
Glucose, Bld: 175 mg/dL — ABNORMAL HIGH (ref 70–99)
HCT: 36 % (ref 36.0–46.0)
Hemoglobin: 12.2 g/dL (ref 12.0–15.0)
Potassium: 3.6 mmol/L (ref 3.5–5.1)
Sodium: 144 mmol/L (ref 135–145)
TCO2: 23 mmol/L (ref 22–32)

## 2024-03-31 LAB — I-STAT VENOUS BLOOD GAS, ED
Acid-base deficit: 8 mmol/L — ABNORMAL HIGH (ref 0.0–2.0)
Bicarbonate: 21.9 mmol/L (ref 20.0–28.0)
Calcium, Ion: 1.14 mmol/L — ABNORMAL LOW (ref 1.15–1.40)
HCT: 35 % — ABNORMAL LOW (ref 36.0–46.0)
Hemoglobin: 11.9 g/dL — ABNORMAL LOW (ref 12.0–15.0)
O2 Saturation: 91 %
Potassium: 3.5 mmol/L (ref 3.5–5.1)
Sodium: 145 mmol/L (ref 135–145)
TCO2: 24 mmol/L (ref 22–32)
pCO2, Ven: 63.8 mmHg — ABNORMAL HIGH (ref 44–60)
pH, Ven: 7.144 — CL (ref 7.25–7.43)
pO2, Ven: 82 mmHg — ABNORMAL HIGH (ref 32–45)

## 2024-03-31 LAB — BRAIN NATRIURETIC PEPTIDE: B Natriuretic Peptide: >4500 pg/mL — ABNORMAL HIGH (ref 0.0–100.0)

## 2024-03-31 LAB — CBG MONITORING, ED: Glucose-Capillary: 122 mg/dL — ABNORMAL HIGH (ref 70–99)

## 2024-03-31 LAB — GLUCOSE, CAPILLARY
Glucose-Capillary: 151 mg/dL — ABNORMAL HIGH (ref 70–99)
Glucose-Capillary: 161 mg/dL — ABNORMAL HIGH (ref 70–99)
Glucose-Capillary: 166 mg/dL — ABNORMAL HIGH (ref 70–99)
Glucose-Capillary: 61 mg/dL — ABNORMAL LOW (ref 70–99)

## 2024-03-31 LAB — TROPONIN I (HIGH SENSITIVITY)
Troponin I (High Sensitivity): 37 ng/L — ABNORMAL HIGH (ref <18)
Troponin I (High Sensitivity): 127 ng/L (ref <18)
Troponin I (High Sensitivity): 487 ng/L (ref <18)

## 2024-03-31 LAB — PROCALCITONIN: Procalcitonin: 2.5 ng/mL

## 2024-03-31 LAB — MRSA NEXT GEN BY PCR, NASAL: MRSA by PCR Next Gen: NOT DETECTED

## 2024-03-31 MED ORDER — ACETAMINOPHEN 325 MG PO TABS
650.0000 mg | ORAL_TABLET | Freq: Four times a day (QID) | ORAL | Status: DC | PRN
  Administered 2024-03-31 – 2024-04-01 (×3): 650 mg via ORAL
  Filled 2024-03-31 (×3): qty 2

## 2024-03-31 MED ORDER — DOCUSATE SODIUM 100 MG PO CAPS: 100.0000 mg | ORAL_CAPSULE | Freq: Two times a day (BID) | ORAL | Status: DC | PRN

## 2024-03-31 MED ORDER — DIAZEPAM 5 MG/ML IJ SOLN
5.0000 mg | Freq: Four times a day (QID) | INTRAMUSCULAR | Status: DC | PRN
  Administered 2024-03-31 (×2): 5 mg via INTRAVENOUS
  Filled 2024-03-31 (×2): qty 2

## 2024-03-31 MED ORDER — POTASSIUM CHLORIDE 10 MEQ/100ML IV SOLN: 10.0000 meq | INTRAVENOUS | Status: DC

## 2024-03-31 MED ORDER — NITROGLYCERIN IN D5W 200-5 MCG/ML-% IV SOLN
0.0000 ug/min | INTRAVENOUS | Status: DC
  Administered 2024-03-31: 50 ug/min via INTRAVENOUS

## 2024-03-31 MED ORDER — INSULIN ASPART 100 UNIT/ML IJ SOLN
0.0000 [IU] | INTRAMUSCULAR | Status: DC
Start: 2024-03-31 — End: 2024-04-03
  Administered 2024-03-31: 2 [IU] via SUBCUTANEOUS
  Administered 2024-03-31 (×3): 3 [IU] via SUBCUTANEOUS
  Administered 2024-04-01 (×2): 2 [IU] via SUBCUTANEOUS
  Administered 2024-04-02: 3 [IU] via SUBCUTANEOUS
  Administered 2024-04-02: 2 [IU] via SUBCUTANEOUS

## 2024-03-31 MED ORDER — NICOTINE 14 MG/24HR TD PT24
14.0000 mg | MEDICATED_PATCH | Freq: Every day | TRANSDERMAL | Status: DC
  Administered 2024-03-31 – 2024-04-03 (×4): 14 mg via TRANSDERMAL
  Filled 2024-03-31 (×4): qty 1

## 2024-03-31 MED ORDER — FUROSEMIDE 10 MG/ML IJ SOLN
40.0000 mg | Freq: Two times a day (BID) | INTRAMUSCULAR | Status: DC
  Administered 2024-03-31 – 2024-04-01 (×3): 40 mg via INTRAVENOUS
  Filled 2024-03-31 (×3): qty 4

## 2024-03-31 MED ORDER — SODIUM CHLORIDE 0.9 % IV SOLN
2.0000 g | INTRAVENOUS | Status: AC
  Administered 2024-03-31 – 2024-04-02 (×3): 2 g via INTRAVENOUS
  Filled 2024-03-31 (×3): qty 20

## 2024-03-31 MED ORDER — MIDAZOLAM HCL 2 MG/2ML IJ SOLN: 2.0000 mg | Freq: Once | INTRAMUSCULAR | Status: DC

## 2024-03-31 MED ORDER — ONDANSETRON HCL 4 MG/2ML IJ SOLN
4.0000 mg | Freq: Four times a day (QID) | INTRAMUSCULAR | Status: DC | PRN
Start: 2024-03-31 — End: 2024-04-03

## 2024-03-31 MED ORDER — METOPROLOL TARTRATE 50 MG PO TABS: 50.0000 mg | ORAL_TABLET | Freq: Two times a day (BID) | ORAL | Status: DC

## 2024-03-31 MED ORDER — ETOMIDATE 2 MG/ML IV SOLN: 20.0000 mg | Freq: Once | INTRAVENOUS | Status: DC

## 2024-03-31 MED ORDER — PHENYLEPHRINE IN HARD FAT 0.25 % RE SUPP
1.0000 | Freq: Two times a day (BID) | RECTAL | Status: DC
  Administered 2024-04-01 – 2024-04-03 (×3): 1 via RECTAL
  Filled 2024-03-31 (×7): qty 1

## 2024-03-31 MED ORDER — GABAPENTIN 300 MG PO CAPS
300.0000 mg | ORAL_CAPSULE | Freq: Every morning | ORAL | Status: DC
  Administered 2024-03-31 – 2024-04-03 (×4): 300 mg via ORAL
  Filled 2024-03-31 (×4): qty 1

## 2024-03-31 MED ORDER — FENTANYL CITRATE PF 50 MCG/ML IJ SOSY
50.0000 ug | PREFILLED_SYRINGE | Freq: Once | INTRAMUSCULAR | Status: AC
  Administered 2024-03-31: 50 ug via INTRAVENOUS
  Filled 2024-03-31: qty 1

## 2024-03-31 MED ORDER — METHOCARBAMOL 500 MG PO TABS
500.0000 mg | ORAL_TABLET | Freq: Three times a day (TID) | ORAL | Status: DC
  Administered 2024-03-31 – 2024-04-03 (×8): 500 mg via ORAL
  Filled 2024-03-31 (×8): qty 1

## 2024-03-31 MED ORDER — HYDRALAZINE HCL 50 MG PO TABS
100.0000 mg | ORAL_TABLET | Freq: Three times a day (TID) | ORAL | Status: DC
  Administered 2024-03-31 – 2024-04-03 (×10): 100 mg via ORAL
  Filled 2024-03-31 (×11): qty 2

## 2024-03-31 MED ORDER — LABETALOL HCL 5 MG/ML IV SOLN: 10.0000 mg | INTRAVENOUS | Status: DC | PRN

## 2024-03-31 MED ORDER — CHLORHEXIDINE GLUCONATE CLOTH 2 % EX PADS
6.0000 | MEDICATED_PAD | Freq: Every day | CUTANEOUS | Status: DC
  Administered 2024-03-31 – 2024-04-03 (×4): 6 via TOPICAL

## 2024-03-31 MED ORDER — ESMOLOL HCL-SODIUM CHLORIDE 2000 MG/100ML IV SOLN: 25.0000 ug/kg/min | INTRAVENOUS | Status: DC

## 2024-03-31 MED ORDER — ASPIRIN 325 MG PO TBEC
325.0000 mg | DELAYED_RELEASE_TABLET | Freq: Every day | ORAL | Status: DC
  Administered 2024-03-31 – 2024-04-03 (×4): 325 mg via ORAL
  Filled 2024-03-31 (×4): qty 1

## 2024-03-31 MED ORDER — AMLODIPINE BESYLATE 10 MG PO TABS
10.0000 mg | ORAL_TABLET | Freq: Every day | ORAL | Status: DC
  Administered 2024-03-31 – 2024-04-03 (×4): 10 mg via ORAL
  Filled 2024-03-31 (×4): qty 1

## 2024-03-31 MED ORDER — DIAZEPAM 5 MG PO TABS: 5.0000 mg | ORAL_TABLET | Freq: Four times a day (QID) | ORAL | Status: DC | PRN

## 2024-03-31 MED ORDER — CLEVIDIPINE BUTYRATE 0.5 MG/ML IV EMUL
0.0000 mg/h | INTRAVENOUS | Status: DC
  Administered 2024-03-31: 2 mg/h via INTRAVENOUS
  Filled 2024-03-31: qty 100

## 2024-03-31 MED ORDER — HYDROMORPHONE HCL 1 MG/ML IJ SOLN
1.0000 mg | INTRAMUSCULAR | Status: DC | PRN
  Administered 2024-03-31 – 2024-04-01 (×2): 1 mg via INTRAVENOUS
  Filled 2024-03-31 (×2): qty 1

## 2024-03-31 MED ORDER — POTASSIUM CHLORIDE 10 MEQ/100ML IV SOLN
10.0000 meq | INTRAVENOUS | Status: DC
Start: 2024-03-31 — End: 2024-03-31
  Administered 2024-03-31: 10 meq via INTRAVENOUS
  Filled 2024-03-31: qty 100

## 2024-03-31 MED ORDER — SODIUM CHLORIDE 0.9 % IV SOLN
500.0000 mg | INTRAVENOUS | Status: AC
  Administered 2024-03-31 – 2024-04-02 (×3): 500 mg via INTRAVENOUS
  Filled 2024-03-31 (×3): qty 5

## 2024-03-31 MED ORDER — HEPARIN SODIUM (PORCINE) 5000 UNIT/ML IJ SOLN
5000.0000 [IU] | Freq: Three times a day (TID) | INTRAMUSCULAR | Status: DC
  Administered 2024-03-31 – 2024-04-03 (×10): 5000 [IU] via SUBCUTANEOUS
  Filled 2024-03-31 (×10): qty 1

## 2024-03-31 MED ORDER — CARVEDILOL 12.5 MG PO TABS
12.5000 mg | ORAL_TABLET | Freq: Two times a day (BID) | ORAL | Status: DC
  Administered 2024-03-31 – 2024-04-01 (×2): 12.5 mg via ORAL
  Filled 2024-03-31 (×2): qty 1

## 2024-03-31 MED ORDER — POTASSIUM CHLORIDE CRYS ER 20 MEQ PO TBCR
40.0000 meq | EXTENDED_RELEASE_TABLET | Freq: Once | ORAL | Status: AC
  Administered 2024-03-31: 40 meq via ORAL
  Filled 2024-03-31: qty 2

## 2024-03-31 MED ORDER — ESMOLOL HCL-SODIUM CHLORIDE 2000 MG/100ML IV SOLN
25.0000 ug/kg/min | INTRAVENOUS | Status: DC
  Administered 2024-03-31: 25 ug/kg/min via INTRAVENOUS
  Filled 2024-03-31: qty 100

## 2024-03-31 MED ORDER — FENTANYL CITRATE PF 50 MCG/ML IJ SOSY: 100.0000 ug | PREFILLED_SYRINGE | Freq: Once | INTRAMUSCULAR | Status: DC

## 2024-03-31 MED ORDER — POLYETHYLENE GLYCOL 3350 17 G PO PACK: 17.0000 g | PACK | Freq: Every day | ORAL | Status: DC | PRN

## 2024-03-31 NOTE — ED Notes (Signed)
 Female NT and RN assisting patient with bedpan.

## 2024-03-31 NOTE — Progress Notes (Signed)
 03/31/2024 Restart home BP meds Check limited echo Nitroglycerin  wean to cleviprex  Okay to start diet Noon trop check  Ardelle Kos MD P[facility]

## 2024-03-31 NOTE — H&P (Signed)
 NAME:  Mary Stephens, MRN:  [id number], DOB:  06/16/1956, LOS: 0 ADMISSION DATE:  03/31/2024, CONSULTATION DATE:  03/31/2024 REFERRING MD:  Editha Goring, PA-C, CHIEF COMPLAINT:  SOB   History of Present Illness:  68 y/o female with PMH for a type a aortic dissection repair by Dr. Deloise Ferries in 2021. She is a diabetic which is diet controlled. She has a history of hepatitis C. She is a current smoker. She is medically managed for hypertension. She takes a statin for hypercholesterolemia, who was BIB EMS for SOB and chest pain, Tripoding Tachypnea, and diaphoretic, Given albuterol breathing treatment PTA. SPO2 93% on non-re breather.  She was unable to speak or communicate other than nodding for very simple questions, appeared very uncomfortable in the ED. ABG showing PH 7.144/PCO2 63.8.  Patient placed on BiPAP and nitroglycerin  drip.  Her initial BP was about 250 mmhg on arrival.  She was alsop briefly placed on Esmolol  drip for rate control, currently in sinus. Per daughter who is at bedside, pt called her earlier and told her her "breathing was changing." Daughter told her to call her after hours doctor or call 911. Pt was cleaning earlier and probably using some bleach.  Patient is a current pack a day smoker but has no h/o COPD and does not use any inhalers. Pertinent  Medical History  type a aortic dissection repair by Dr. Deloise Ferries in 2021. Diabetes mellitus, hepatitis C,  current smoker, hypertension and hypercholesterolemia.   Significant Hospital Events: Including procedures, antibiotic start and stop dates in addition to other pertinent events   4/19 admitted to ICU for acute Hypoxic and hypercapnic respiratory failure and Hypertensive Emergency  Interim History / Subjective:  N/a  Objective   Blood pressure (!) 165/123, pulse 80, temperature (!) 97.5 F (36.4 C), temperature source Oral, resp. rate (!) 27, weight 64.4 kg, SpO2 100%.    Vent Mode: PSV;BIPAP FiO2 (%):  [100 %] 100  % PEEP:  [5 cmH20] 5 cmH20 Pressure Support:  [12 cmH20] 12 cmH20  No intake or output data in the 24 hours ending 03/31/24 0617 Filed Weights   03/31/24 0408  Weight: 64.4 kg    Examination: General: elderly female on BiPAP but no acute respiratory distress HENT: BiPAP mask on, neck is supple, pos JVD Lungs: CTA and diminished no wheezes and few creps, no rhonchi Cardiovascular: reg s1s2 no murmurs or gallops Abdomen: soft nt nd bs pos no guarding Extremities: no cyanosis, clubbing or edema Neuro: AAO x3 CN II to XII grossly intact   Resolved Hospital Problem list   N/a  Assessment & Plan:  Acute Hypoxic and hypercapnic respiratory failure  -Continue with BiPAP and O2  -follow O2 sats and serial ABG to steady state Hypertensive Emergency  -Currently on Nitrogen drip at 70mcg, will continue  -Labetalol  prn  -BP currently systolic 170, will keep at that level and slow to wean lower, daughter and   Patient says her SBP usually 150  -She is not always compliant with her medications Acute Pulmonary Edema  -Will need diuresis and BiPAP Acute on chronic kidney injury  -Monitor I's/O's  -Monitor BUN/Cr Acute decompensated congestive heart failure with pericardial and small pleural effusions  -Will get echo  -BP control  -Diuresis  -will add antibiotics empirically for pneumonia possibility Tobacco abuse disorder  -Nicotine  patch Thyroid nodule-incidental finding on CT scan  -evaluate as outpatient Aortic Aneurysm  -no signs of dissection  -consider Vascular consultation  -BP control  Best  Practice (right click and "Reselect all SmartList Selections" daily)   Diet/type: NPO w/ oral meds DVT prophylaxis SCD Pressure ulcer(s): N/A GI prophylaxis: N/A Lines: N/A Foley:  N/A Code Status:  full code   Labs   CBC: Recent Labs  Lab 03/31/24 0414 03/31/24 0418 03/31/24 0428 03/31/24 0600  WBC  --   --  13.0*  --   NEUTROABS  --   --  4.7  --   HGB 12.2  11.9*  12.2 11.4* 11.2*  HCT 36.0  35.0* 36.0 36.5 33.0*  MCV  --   --  104.6*  --   PLT  --   --  193  --     Basic Metabolic Panel: Recent Labs  Lab 03/31/24 0414 03/31/24 0418 03/31/24 0428 03/31/24 0600  NA 144  145 143 143 144  K 3.6  3.5 3.6 3.4* 3.2*  CL 112*  --  110  --   CO2  --   --  19*  --   GLUCOSE 175*  --  180*  --   BUN 48*  --  37*  --   CREATININE 4.10*  --  3.78*  --   CALCIUM   --   --  8.3*  --    GFR: Estimated Creatinine Clearance: 14 mL/min (A) (by C-G formula based on SCr of 3.78 mg/dL (H)). Recent Labs  Lab 03/31/24 0428  WBC 13.0*    Liver Function Tests: No results for input(s): "AST", "ALT", "ALKPHOS", "BILITOT", "PROT", "ALBUMIN " in the last 168 hours. No results for input(s): "LIPASE", "AMYLASE" in the last 168 hours. No results for input(s): "AMMONIA" in the last 168 hours.  ABG    Component Value Date/Time   PHART 7.306 (L) 03/31/2024 0600   PCO2ART 44.4 03/31/2024 0600   PO2ART 132 (H) 03/31/2024 0600   HCO3 22.1 03/31/2024 0600   TCO2 23 03/31/2024 0600   ACIDBASEDEF 4.0 (H) 03/31/2024 0600   O2SAT 99 03/31/2024 0600     Coagulation Profile: No results for input(s): "INR", "PROTIME" in the last 168 hours.  Cardiac Enzymes: No results for input(s): "CKTOTAL", "CKMB", "CKMBINDEX", "TROPONINI" in the last 168 hours.  HbA1C: Hgb A1c MFr Bld  Date/Time Value Ref Range Status  05/06/2023 03:21 AM 5.3 4.8 - 5.6 % Final    Comment:    (NOTE)         Prediabetes: 5.7 - 6.4         Diabetes: >6.4         Glycemic control for adults with diabetes: <7.0   01/27/2018 03:18 PM 5.3 4.8 - 5.6 % Final    Comment:             Prediabetes: 5.7 - 6.4          Diabetes: >6.4          Glycemic control for adults with diabetes: <7.0     CBG: No results for input(s): "GLUCAP" in the last 168 hours.  Review of Systems:   Relieved chest pains, pos SOB  Past Medical History:  She,  has a past medical history of Anxiety, Arthritis,  Cataract, Colon polyps, Depression, Diabetes mellitus without complication (HCC), GERD (gastroesophageal reflux disease), H/O aortic dissection (2021), Hepatitis C, Hyperlipidemia, Hypertension, and Neuropathy.   Surgical History:   Past Surgical History:  Procedure Laterality Date   COLONOSCOPY  ?2001,2011?   in [place] (1st colon 20 polyps removed-per pt)   IR THORACENTESIS ASP PLEURAL SPACE W/IMG GUIDE  12/09/2020   PARTIAL HYSTERECTOMY  2001   REPAIR OF ACUTE ASCENDING THORACIC AORTIC DISSECTION N/A 12/02/2020   Procedure: REPAIR OF ACUTE ASCENDING THORACIC AORTIC DISSECTION VIA CIRC ARREST USING HEMASHIELD PLATINUM 28 MM VASCULAR GRAFT.;  Surgeon: Hilarie Lovely, MD;  Location: [facility] OR;  Service: Vascular;  Laterality: N/A;  AXILLARY CANNULATION AND CIRC ARREST   REPAIR OF ACUTE ASCENDING THORACIC AORTIC DISSECTION VIA CIRC ARREST USING HEMASHIELD PLATINUM 28 MM VASCULAR GRAFT. (N/A)  12/02/2020   TEE WITHOUT CARDIOVERSION  12/02/2020   Procedure: TRANSESOPHAGEAL ECHOCARDIOGRAM (TEE);  Surgeon: Hilarie Lovely, MD;  Location: [facility] OR;  Service: Vascular;;     Social History:   reports that she has been smoking cigarettes. She has never used smokeless tobacco. She reports current alcohol use of about 1.0 standard drink of alcohol per week. She reports that she does not currently use drugs after having used the following drugs: Marijuana.   Family History:  Her family history includes Diabetes in her mother. There is no history of Colon cancer, Colon polyps, Esophageal cancer, Rectal cancer, or Stomach cancer.   Allergies No Known Allergies   Home Medications  Prior to Admission medications   Medication Sig Start Date End Date Taking? Authorizing Provider  acetaminophen  (TYLENOL ) 500 MG tablet Take 1,000 mg by mouth every 8 (eight) hours as needed for moderate pain.    [provider]  amLODipine  (NORVASC ) 10 MG tablet Take 1 tablet (10 mg total) by mouth daily.  05/08/23   Armenta Landau, MD  aspirin  EC 325 MG EC tablet Take 1 tablet (325 mg total) by mouth daily. 12/10/20   Barrett, Malachy Scripture, PA-C  atorvastatin  (LIPITOR) 10 MG tablet Take 1 tablet (10 mg total) by mouth daily. 01/27/18   Fleming, Zelda W, NP  diclofenac  Sodium (VOLTAREN ) 1 % GEL Apply 2-4 g topically 4 (four) times daily as needed (for bilateral foot pain).    [provider]  gabapentin  (NEURONTIN ) 300 MG capsule Take 300 mg by mouth in the morning.    [provider]  Glucerna (GLUCERNA) LIQD Take 237 mLs by mouth 2 (two) times daily between meals.    [provider]  glucose blood test strip Check BS twice daily, 250.00 09/12/14   Pleas Brill, NP  glucose monitoring kit (FREESTYLE) monitoring kit 1 each by Does not apply route as needed for other. Please check BS twice daily before meals, 250.00 09/12/14   Pleas Brill, NP  hydrALAZINE  (APRESOLINE ) 25 MG tablet Take 3 tablets (75 mg total) by mouth 3 (three) times daily. 05/08/23   Armenta Landau, MD  Lancets (FREESTYLE) lancets Check BS twice daily before meals, 250.00 09/12/14   Pleas Brill, NP  metoprolol  tartrate (LOPRESSOR ) 50 MG tablet Take 1 tablet (50 mg total) by mouth 2 (two) times daily. 05/08/23   Armenta Landau, MD  glipiZIDE  (GLUCOTROL  XL) 5 MG 24 hr tablet Take 1 tablet (5 mg total) by mouth daily with breakfast. Patient not taking: Reported on 06/20/2020 01/27/18 06/20/20  Collins Dean, NP     Critical care time: 66   The patient is critically ill with multiple organ system failure and requires high complexity decision making for assessment and support, frequent evaluation and titration of therapies, advanced monitoring, review of radiographic studies and interpretation of complex data.   Critical Care Time devoted to patient care services, exclusive of separately billable procedures, described in this note is 38 minutes.   Judie Noun  Mason Sole,  MD [facility] See Amion for pager  If no response to pager , please call [phone] until 7pm After 7:00 pm call Elink  [phone] 03/31/2024, 6:17 AM

## 2024-03-31 NOTE — ED Triage Notes (Addendum)
 BIB EMS for SOB and chest pain, Tripoding Tachypnea, and diaphoretic, Given albuterol breathing treatment PTA. SPO2 93% on non-re breather.

## 2024-03-31 NOTE — Plan of Care (Signed)
  Problem: Metabolic: Goal: Ability to maintain appropriate glucose levels will improve Outcome: Progressing   Problem: Nutritional: Goal: Maintenance of adequate nutrition will improve Outcome: Progressing   Problem: Skin Integrity: Goal: Risk for impaired skin integrity will decrease Outcome: Progressing   

## 2024-03-31 NOTE — ED Provider Notes (Signed)
  Physical Exam  BP (!) 232/155   Pulse (!) 124   Resp (!) 45   Wt 64.4 kg   SpO2 100%   BMI 21.59 kg/m   Physical Exam Constitutional:      General: She is in acute distress.     Appearance: She is ill-appearing and diaphoretic.  Neck:     Vascular: JVD present.  Pulmonary:     Breath sounds: Decreased breath sounds and wheezing (in bases) present.  Musculoskeletal:     Right lower leg: No edema.     Left lower leg: No edema.  Neurological:     Mental Status: She is alert.     Procedures  .Critical Care  Performed by: Eve Hinders, MD Authorized by: Eve Hinders, MD   Critical care provider statement:    Critical care time (minutes):  30   Critical care was necessary to treat or prevent imminent or life-threatening deterioration of the following conditions:  Respiratory failure, circulatory failure and CNS failure or compromise   Critical care was time spent personally by me on the following activities:  Development of treatment plan with patient or surrogate, discussions with consultants, evaluation of patient's response to treatment, examination of patient, ordering and review of laboratory studies, ordering and review of radiographic studies, ordering and performing treatments and interventions, pulse oximetry, re-evaluation of patient's condition and review of old charts   ED Course / MDM    Medical Decision Making Amount and/or Complexity of Data Reviewed Labs: ordered. Radiology: ordered.  Risk Prescription drug management. Decision regarding hospitalization.  Distressed. Mild chest pain but mostly severe dyspnea. H/o AAA and dissection s/p repair. Lungs not real telling, cxr with some opacities but nothing to explain level of resp distress. First BP significantly elevated, suspect flash pulm edema vs dissection will take emergently to scanner. Creatinine elevated above baseline, will have to start with non con and decide if worth the risk for contrast if no  other obvious causes for her symptoms. CT appears to have pulmonary edema on my interpretation. Non contrasted but no obvious dissection, aneurysm still present. Will switch esmolol  to NTG infusion for hypertensive crisis/acute pulm edema. Admit to ICU.     Eve Hinders, MD 03/31/24 [phone]

## 2024-03-31 NOTE — ED Notes (Signed)
 RN notified IP RN pt on her way up

## 2024-03-31 NOTE — ED Provider Notes (Signed)
 [facility] Provider Note   CSN: [id number] Arrival date & time: 03/31/24  0401     History  Chief Complaint  Patient presents with   Shortness of Breath   Chest Pain    Mary Stephens is a 68 y.o. female.  68 year old female brought in by EMS from home in respiratory distress.  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  Patient is unable to speak or communicate other than nodding for very simple questions, appears very uncomfortable.       Home Medications Prior to Admission medications   Medication Sig Start Date End Date Taking? Authorizing Provider  acetaminophen  (TYLENOL ) 500 MG tablet Take 1,000 mg by mouth every 8 (eight) hours as needed for moderate pain.    [provider]  amLODipine  (NORVASC ) 10 MG tablet Take 1 tablet (10 mg total) by mouth daily. 05/08/23   Armenta Landau, MD  aspirin  EC 325 MG EC tablet Take 1 tablet (325 mg total) by mouth daily. 12/10/20   Barrett, Erin R, PA-C  atorvastatin  (LIPITOR) 10 MG tablet Take 1 tablet (10 mg total) by mouth daily. 01/27/18   Fleming, Zelda W, NP  diclofenac  Sodium (VOLTAREN ) 1 % GEL Apply 2-4 g topically 4 (four) times daily as needed (for bilateral foot pain).    [provider]  gabapentin  (NEURONTIN ) 300 MG capsule Take 300 mg by mouth in the morning.    [provider]  Glucerna (GLUCERNA) LIQD Take 237 mLs by mouth 2 (two) times daily between meals.    [provider]  glucose blood test strip Check BS twice daily, 250.00 09/12/14   Pleas Brill, NP  glucose monitoring kit (FREESTYLE) monitoring kit 1 each by Does not apply route as needed for other. Please check BS twice daily before meals, 250.00 09/12/14   Pleas Brill, NP  hydrALAZINE  (APRESOLINE ) 25 MG tablet Take 3 tablets (75  mg total) by mouth 3 (three) times daily. 05/08/23   Armenta Landau, MD  Lancets (FREESTYLE) lancets Check BS twice daily before meals, 250.00 09/12/14   Pleas Brill, NP  metoprolol  tartrate (LOPRESSOR ) 50 MG tablet Take 1 tablet (50 mg total) by mouth 2 (two) times daily. 05/08/23   Armenta Landau, MD  glipiZIDE  (GLUCOTROL  XL) 5 MG 24 hr tablet Take 1 tablet (5 mg total) by mouth daily with breakfast. Patient not taking: Reported on 06/20/2020 01/27/18 06/20/20  Fleming, Zelda W, NP      Allergies    Patient has no known allergies.    Review of Systems   Review of Systems Level 5 caveat for resp distress Physical Exam Updated Vital Signs BP (!) 174/120   Pulse 85   Temp (!) 97.5 F (36.4 C) (Oral)   Resp (!) 27   Wt 64.4 kg   SpO2 100%   BMI 21.59 kg/m  Physical Exam Vitals and nursing note reviewed.  Constitutional:      General: She is in acute distress.     Appearance: She is well-developed. She is ill-appearing and diaphoretic.  HENT:     Head: Normocephalic and atraumatic.  Neck:     Vascular: JVD present.  Cardiovascular:     Rate and Rhythm: Regular rhythm. Tachycardia present.  Pulmonary:     Effort: Tachypnea and respiratory distress present.  Breath sounds: Examination of the right-lower field reveals decreased breath sounds. Examination of the left-lower field reveals decreased breath sounds. Decreased breath sounds and wheezing present.  Abdominal:     Palpations: Abdomen is soft.     Tenderness: There is no abdominal tenderness.  Musculoskeletal:     Right lower leg: No tenderness. No edema.     Left lower leg: No edema.  Skin:    General: Skin is warm.  Neurological:     Mental Status: She is alert.     Comments: Moves all extremities purposefully      ED Results / Procedures / Treatments   Labs (all labs ordered are listed, but only abnormal results are displayed) Labs Reviewed  BASIC METABOLIC PANEL WITH GFR - Abnormal; Notable for the  following components:      Result Value   Potassium 3.4 (*)    CO2 19 (*)    Glucose, Bld 180 (*)    BUN 37 (*)    Creatinine, Ser 3.78 (*)    Calcium  8.3 (*)    GFR, Estimated 12 (*)    All other components within normal limits  CBC WITH DIFFERENTIAL/PLATELET - Abnormal; Notable for the following components:   WBC 13.0 (*)    RBC 3.49 (*)    Hemoglobin 11.4 (*)    MCV 104.6 (*)    Lymphs Abs 7.5 (*)    All other components within normal limits  BRAIN NATRIURETIC PEPTIDE - Abnormal; Notable for the following components:   B Natriuretic Peptide >4,500.0 (*)    All other components within normal limits  I-STAT CHEM 8, ED - Abnormal; Notable for the following components:   Chloride 112 (*)    BUN 48 (*)    Creatinine, Ser 4.10 (*)    Glucose, Bld 175 (*)    Calcium , Ion 1.11 (*)    All other components within normal limits  I-STAT VENOUS BLOOD GAS, ED - Abnormal; Notable for the following components:   pH, Ven 7.144 (*)    pCO2, Ven 63.8 (*)    pO2, Ven 82 (*)    Acid-base deficit 8.0 (*)    Calcium , Ion 1.14 (*)    HCT 35.0 (*)    Hemoglobin 11.9 (*)    All other components within normal limits  I-STAT ARTERIAL BLOOD GAS, ED - Abnormal; Notable for the following components:   pH, Arterial 7.109 (*)    pCO2 arterial 70.0 (*)    Acid-base deficit 8.0 (*)    All other components within normal limits  I-STAT ARTERIAL BLOOD GAS, ED - Abnormal; Notable for the following components:   pH, Arterial 7.306 (*)    pO2, Arterial 132 (*)    Acid-base deficit 4.0 (*)    Potassium 3.2 (*)    HCT 33.0 (*)    Hemoglobin 11.2 (*)    All other components within normal limits  TROPONIN I (HIGH SENSITIVITY) - Abnormal; Notable for the following components:   Troponin I (High Sensitivity) 37 (*)    All other components within normal limits  TROPONIN I (HIGH SENSITIVITY)    EKG None  Radiology CT Chest Wo Contrast Result Date: 03/31/2024 CLINICAL DATA:  Shortness breath, chest pain  and diaphoresis. EXAM: CT CHEST WITHOUT CONTRAST TECHNIQUE: Multidetector CT imaging of the chest was performed following the standard protocol without IV contrast. RADIATION DOSE REDUCTION: This exam was performed according to the departmental dose-optimization program which includes automated exposure control, adjustment of the mA and/or  kV according to patient size and/or use of iterative reconstruction technique. COMPARISON:  Portable chest from today, chest CTs without contrast 12/09/2023 and 11/15/2022. FINDINGS: Cardiovascular: There is mild cardiomegaly, small pericardial effusion interval increased now 7 mm thick. There are three-vessel coronary calcifications greatest in the circumflex coronary artery. Chronic enlargement of the pulmonary trunk indicating arterial hypertension and measuring 3.6 cm. There is prominence of the central pulmonary veins more than previously. 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 This extends from the sinotubular junction to the ascending aortic/arch junction. The the proximal arch distal to the graft today measures 4.3 cm on 3:68, previously 4.2 cm. Dilatation in the aortic isthmus is 4.9 cm craniocaudal on 6:94, increased from the last CT when this measured 4.2 cm. The descending aorta measures 3.9 cm AP on 6:102, stable. The distal descending aorta is 3.7 cm on 6:87, also stable. Mediastinum/Nodes: There is a 1.5 cm heterogeneous solid nodule posteriorly in the lower pole of the right thyroid lobe. Nonemergent follow-up ultrasound recommended. Axillary spaces are clear. No intrathoracic adenopathy is seen without contrast. There is a chronically patulous esophagus with a normal wall thickness. The trachea and main bronchi are patent. Lungs/Pleura: There are bilateral small layering pleural effusions. There is interlobular septal edema in the lung apices and bases consistent with CHF or fluid overload. There is diffuse bronchial thickening which could be congestive and/or  bronchitic. There are bilateral ground-glass opacities with relative peripheral sparing showing a gravity dependent as well as a basal gradient. This is probably ground-glass edema but underlying pneumonia would be difficult to exclude. Upper Abdomen: Asymmetric left renal atrophy and cysts. Abdominal aortic atherosclerosis. Abdominal findings. Musculoskeletal: No chest wall mass or suspicious bone lesions identified. IMPRESSION: 1. Cardiomegaly with small pericardial effusion, interlobular septal edema, and small pleural effusions consistent with CHF or fluid overload. 2. Bilateral ground-glass opacities with relative peripheral sparing, probably ground-glass edema but underlying pneumonia would be difficult to exclude. 3. Diffuse bronchial thickening which could be congestive and/or bronchitic. 4. Aortic atherosclerosis with aortic graft repair. 5. Slightly increased aneurysmal prominence in the proximal aortic arch but only by 1 mm, more significant increased prominence of the aortic isthmus now 4.9 mm, increased 7 mm. Recommend vascular surgery referral and MRA or CTA. 6. Chronic enlargement of the pulmonary trunk indicating arterial hypertension. 7. 1.5 cm heterogeneous solid nodule posteriorly in the lower pole of the right thyroid lobe. Nonemergent follow-up ultrasound recommended. 8. Asymmetric left renal atrophy and cysts. 9. These results will be called to the ordering clinician or representative by the Radiologist Assistant, and communication documented in the PACS or [employer]. Aortic Atherosclerosis (ICD10-I70.0). Electronically Signed   By: Denman Fischer M.D.   On: 03/31/2024 05:12   DG Chest Port 1 View Result Date: 03/31/2024 CLINICAL DATA:  68 year old female with shortness of breath. EXAM: PORTABLE CHEST 1 VIEW COMPARISON:  Chest CT 12/09/2023 and earlier. FINDINGS: Portable AP semi upright view at 0403 hours. Cardiomegaly and chronically enlarged thoracic aorta contours are stable.  Stable lung volumes. No pneumothorax, pleural effusion or consolidation. Diffuse increased pulmonary interstitium when compared to previous radiographs. Visualized tracheal air column is within normal limits. No acute osseous abnormality identified. Negative visible bowel gas. IMPRESSION: Chronic cardiomegaly with diffusely increased pulmonary interstitium favored to reflect acute pulmonary edema. Viral/atypical respiratory infection felt less likely. No pleural effusion or consolidation. Electronically Signed   By: Marlise Simpers M.D.   On: 03/31/2024 04:19    Procedures .Critical Care  Performed by:  Darlis Eisenmenger, PA-C Authorized by: Darlis Eisenmenger, PA-C   Critical care provider statement:    Critical care time (minutes):  90   Critical care was necessary to treat or prevent imminent or life-threatening deterioration of the following conditions:  Respiratory failure   Critical care was time spent personally by me on the following activities:  Development of treatment plan with patient or surrogate, discussions with consultants, evaluation of patient's response to treatment, examination of patient, ordering and review of laboratory studies, ordering and review of radiographic studies, ordering and performing treatments and interventions, pulse oximetry, re-evaluation of patient's condition and review of old charts     Medications Ordered in ED Medications  nitroGLYCERIN  50 mg in dextrose  5 % 250 mL (0.2 mg/mL) infusion (60 mcg/min Intravenous Rate/Dose Change 03/31/24 0541)  esmolol  (BREVIBLOC ) 2000 mg / 100 mL (20 mg/mL) infusion (0 mcg/kg/min  64.4 kg Intravenous Stopped 03/31/24 0550)  fentaNYL  (SUBLIMAZE ) injection 50 mcg (50 mcg Intravenous Given 03/31/24 0442)    ED Course/ Medical Decision Making/ A&P                                 Medical Decision Making Amount and/or Complexity of Data Reviewed Labs: ordered. Radiology: ordered.   This patient presents to the ED for concern of  respiratory distress, this involves an extensive number of treatment options, and is a complaint that carries with it a high risk of complications and morbidity.  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 Pt was cleaning earlier and probably using some bleach.  External records from outside source obtained and reviewed including prior vascular note as well as prior CT chest wo contrast   Lab Tests:  I Ordered, and personally interpreted labs.  The pertinent results include:  BNP >4500, I stat vbg pH 7.144. istat ABG pH 7.109, pCO2 70. CBC with WBS 13, hgb 11.4. trop 37 (similar to prior). BMP with Cr 3.78 with GFR 12.    Imaging Studies ordered:  I ordered imaging studies including CXR, CT chest w/o contrast   I independently visualized and interpreted imaging which showed concern for pulmonary edema I agree with the radiologist interpretation   Cardiac Monitoring: / EKG:  The patient was maintained on a cardiac monitor.  I personally viewed and interpreted the cardiac monitored which showed an underlying rhythm of: sinus tach, rate 115   Problem List / ED Course / Critical interventions / Medication management  68 yo female brought in by EMS from home where she was found on the ground in respiratory distress. EMS tried bipap without success, arrives with albuterol neb running with O2 sat 92% on nebulizer. Placed on NRB with improvement to 95% with plan to trial bipap in the ER. Patient unable to talk, holding chest indicating  chest pain to some extent. Records reveal known dissection repair with known aneurysm. Pt nods "no" when asked if similar prior to resp history. Cr elevated, unable to scan with contrast, CT chest w/o contrast with concern  for CHF vs pulmonary edema. BP and HR improving on esmolol  drip, transitioned to nitroglycerin . Mental status improving, pt more comfortable, chest pain improving. Call to critical care for admission.  5:00am Patient on bi-pap, nods head yes, she's is feeling better, chest is feeling better. BP improving (212/133), HR improved (94) 5:46am Daughter, Normand Beckwith, at bedside, updated; patient continues to tolerate bipap well, improving mental status/level of comfort  I ordered medication including fentanyl  for pain; esmolol  for HTN/tachycardia; nitroglycerin  for pulmonary edema Reevaluation of the patient after these medicines showed that the patient improved I have reviewed the patients home medicines and have made adjustments as needed   Consultations Obtained:  I requested consultation with the ER attending, Dr. Luberta Ruse,  and discussed lab and imaging findings as well as pertinent plan - they recommend: to bedside, recommend bipap, CT chest wo contrast due to CKD, Cr 4.1 on istat chem. Esmolol  for BP/HR control, transition to nitroglycerin  after CT chest suggesting pulmonary edema Case discussed with Dr. Mason Sole with critical care who will consult for admission.    Social Determinants of Health:  Lives alone   Test / Admission - Considered:  admit         Final Clinical Impression(s) / ED Diagnoses Final diagnoses:  Respiratory distress  Acute pulmonary edema (HCC)  Chronic kidney disease, unspecified CKD stage  Thoracic aortic aneurysm without rupture, unspecified part [employer])  Thyroid nodule    Rx / DC Orders ED Discharge Orders     None         Darlis Eisenmenger, PA-C 03/31/24 0602    Mesner, Reymundo Caulk, MD 03/31/24 [phone]

## 2024-03-31 NOTE — Progress Notes (Signed)
 Patient is off the BIPAP at this time and placed on 5L [place], tolerating well, vitals stable.

## 2024-03-31 NOTE — Progress Notes (Signed)
 eLink Physician-Brief Progress Note Patient Name: Mary Stephens DOB: [birth year] MRN: [id number]   Date of Service  03/31/2024  HPI/Events of Note  68 y/o with hypertensive emergency, on BiPAP and NTG drip h/o aortic aneurysm  Hemorrhorids bleeding and burning now that she is on SQ heparin .   Gets Valium  and dilaudid  for visible leg cramps.  Patient says the valium  IV burns and requests it be switched to PO. Incomplete effect   eICU Interventions  Add phenylephrine  suppositories  With regards to her volume, we will switch this to p.o.  Add methocarbamol .     Intervention Category Minor Interventions: Routine modifications to care plan (e.g. PRN medications for pain, fever)  Camryn Lampson 03/31/2024, 8:42 PM

## 2024-04-01 ENCOUNTER — Inpatient Hospital Stay (HOSPITAL_COMMUNITY): Payer: Medicare (Managed Care)

## 2024-04-01 ENCOUNTER — Encounter (HOSPITAL_COMMUNITY): Payer: Self-pay

## 2024-04-01 DIAGNOSIS — I161 Hypertensive emergency: Secondary | ICD-10-CM | POA: Diagnosis not present

## 2024-04-01 DIAGNOSIS — J90 Pleural effusion, not elsewhere classified: Secondary | ICD-10-CM

## 2024-04-01 DIAGNOSIS — N179 Acute kidney failure, unspecified: Secondary | ICD-10-CM | POA: Diagnosis not present

## 2024-04-01 LAB — GLUCOSE, CAPILLARY
Glucose-Capillary: 104 mg/dL — ABNORMAL HIGH (ref 70–99)
Glucose-Capillary: 113 mg/dL — ABNORMAL HIGH (ref 70–99)
Glucose-Capillary: 120 mg/dL — ABNORMAL HIGH (ref 70–99)
Glucose-Capillary: 125 mg/dL — ABNORMAL HIGH (ref 70–99)
Glucose-Capillary: 129 mg/dL — ABNORMAL HIGH (ref 70–99)
Glucose-Capillary: 151 mg/dL — ABNORMAL HIGH (ref 70–99)

## 2024-04-01 LAB — BASIC METABOLIC PANEL WITH GFR
Anion gap: 11 (ref 5–15)
BUN: 41 mg/dL — ABNORMAL HIGH (ref 8–23)
CO2: 23 mmol/L (ref 22–32)
Calcium: 8.8 mg/dL — ABNORMAL LOW (ref 8.9–10.3)
Chloride: 107 mmol/L (ref 98–111)
Creatinine, Ser: 3.97 mg/dL — ABNORMAL HIGH (ref 0.44–1.00)
GFR, Estimated: 12 mL/min — ABNORMAL LOW (ref >60)
Glucose, Bld: 99 mg/dL (ref 70–99)
Potassium: 3.4 mmol/L — ABNORMAL LOW (ref 3.5–5.1)
Sodium: 141 mmol/L (ref 135–145)

## 2024-04-01 LAB — CBC
HCT: 30.3 % — ABNORMAL LOW (ref 36.0–46.0)
Hemoglobin: 10 g/dL — ABNORMAL LOW (ref 12.0–15.0)
MCH: 32.4 pg (ref 26.0–34.0)
MCHC: 33 g/dL (ref 30.0–36.0)
MCV: 98.1 fL (ref 80.0–100.0)
Platelets: 155 10*3/uL (ref 150–400)
RBC: 3.09 MIL/uL — ABNORMAL LOW (ref 3.87–5.11)
RDW: 13 % (ref 11.5–15.5)
WBC: 6.7 10*3/uL (ref 4.0–10.5)
nRBC: 0 % (ref 0.0–0.2)

## 2024-04-01 LAB — TRIGLYCERIDES: Triglycerides: 52 mg/dL (ref <150)

## 2024-04-01 LAB — MAGNESIUM: Magnesium: 2.2 mg/dL (ref 1.7–2.4)

## 2024-04-01 MED ORDER — CAMPHOR-MENTHOL 0.5-0.5 % EX LOTN
TOPICAL_LOTION | CUTANEOUS | Status: DC | PRN
  Filled 2024-04-01: qty 222

## 2024-04-01 MED ORDER — POTASSIUM CHLORIDE CRYS ER 20 MEQ PO TBCR
40.0000 meq | EXTENDED_RELEASE_TABLET | Freq: Once | ORAL | Status: AC
  Administered 2024-04-01: 40 meq via ORAL
  Filled 2024-04-01: qty 2

## 2024-04-01 MED ORDER — CARVEDILOL 25 MG PO TABS
25.0000 mg | ORAL_TABLET | Freq: Two times a day (BID) | ORAL | Status: DC
  Administered 2024-04-01 – 2024-04-03 (×4): 25 mg via ORAL
  Filled 2024-04-01 (×4): qty 1

## 2024-04-01 MED ORDER — OXYCODONE-ACETAMINOPHEN 5-325 MG PO TABS: 1.0000 | ORAL_TABLET | ORAL | Status: DC | PRN

## 2024-04-01 NOTE — Progress Notes (Signed)
 NAME:  Mary Stephens, MRN:  [id number], DOB:  June 14, 1956, LOS: 1 ADMISSION DATE:  03/31/2024, CONSULTATION DATE:  03/31/2024 REFERRING MD:  Editha Goring, PA-C, CHIEF COMPLAINT:  SOB   History of Present Illness:  68 y/o female with PMH for a type a aortic dissection repair by Dr. Deloise Ferries in 2021. She is a diabetic which is diet controlled. She has a history of hepatitis C. She is a current smoker. She is medically managed for hypertension. She takes a statin for hypercholesterolemia, who was BIB EMS for SOB and chest pain, Tripoding Tachypnea, and diaphoretic, Given albuterol breathing treatment PTA. SPO2 93% on non-re breather.  She was unable to speak or communicate other than nodding for very simple questions, appeared very uncomfortable in the ED. ABG showing PH 7.144/PCO2 63.8.  Patient placed on BiPAP and nitroglycerin  drip.  Her initial BP was about 250 mmhg on arrival.  She was alsop briefly placed on Esmolol  drip for rate control, currently in sinus. Per daughter who is at bedside, pt called her earlier and told her her "breathing was changing." Daughter told her to call her after hours doctor or call 911. Pt was cleaning earlier and probably using some bleach.  Patient is a current pack a day smoker but has no h/o COPD and does not use any inhalers. Pertinent  Medical History  type a aortic dissection repair by Dr. Deloise Ferries in 2021. Diabetes mellitus, hepatitis C,  current smoker, hypertension and hypercholesterolemia.   Significant Hospital Events: Including procedures, antibiotic start and stop dates in addition to other pertinent events   4/19 admitted to ICU for acute Hypoxic and hypercapnic respiratory failure and Hypertensive Emergency  Interim History / Subjective:  Pain better controlled with current regimen. Some hemorrhoids overnight better with phenylephrine  cream  Objective   Blood pressure (!) 157/99, pulse 89, temperature 98.5 F (36.9 C), temperature source Oral,  resp. rate 19, weight 53.3 kg, SpO2 98%.        Intake/Output Summary (Last 24 hours) at 04/01/2024 8295 Last data filed at 04/01/2024 0700 Gross per 24 hour  Intake 571.14 ml  Output 150 ml  Net 421.14 ml   Filed Weights   03/31/24 0408 04/01/24 0435  Weight: 64.4 kg 53.3 kg    Examination: No distress Moves to command Globally weak Neuro nonfocal Heart/lungs normal I/O not accurate: not allowing any checks  Trop and Cr drifted up CBC stable  Resolved Hospital Problem list   N/a  Assessment & Plan:  Hypertensive urgency/emergency Acute kidney injury vs advancing CKD Chronic muscle spasms/pain Prior aortic dissection s/p repair with some enlargement on scan NSTEMI suspect type II- echo pending, no ongoing anginal symptoms, f/u echo Hemorrhoids Muscular deconditioning  - Multimodal pain strategy (Standing robaxin , PRN valium , oxy and tylenol  PRN, sarna cream) - Check am iron panel in case these spasms are RLS-equivalent and could be iron def anemia - PO antihypertensives as ordered - PT/OT eval - Echocardiogram, consider cards eval if abnormal - Stop diuretics, check renal US , consider nephro consult to establish care as OP - Would let [facility] know about patient tomorrow for closer f/u of aneurysm since it is slightly bigger; likely related to poorly controlled HTN - Pct mildly up I guess give 3 days ceftriaxone  for ?bronchitis or CAP  Best Practice (right click and "Reselect all SmartList Selections" daily)   Diet/type: advance as tolerated DVT prophylaxis heparin  for DVT ppx Pressure ulcer(s): N/A GI prophylaxis: N/A Lines: N/A Foley:  N/A Code Status:  full code  Stable for transfer to progressive; remaining issues:  Pain control PT/OT eval Echo f/u Renal function stability  Appreciate TRH taking over starting 04/02/24 Ardelle Kos MD PCCM

## 2024-04-01 NOTE — Progress Notes (Signed)
 Pt transferred to 4E06 from 2H.  Telemetry applied, CCMD notified.  Pt oriented to room and call bell.

## 2024-04-02 ENCOUNTER — Inpatient Hospital Stay (HOSPITAL_COMMUNITY): Payer: Medicare (Managed Care)

## 2024-04-02 ENCOUNTER — Other Ambulatory Visit: Payer: Self-pay

## 2024-04-02 DIAGNOSIS — I161 Hypertensive emergency: Secondary | ICD-10-CM | POA: Diagnosis not present

## 2024-04-02 DIAGNOSIS — I509 Heart failure, unspecified: Secondary | ICD-10-CM

## 2024-04-02 DIAGNOSIS — N179 Acute kidney failure, unspecified: Secondary | ICD-10-CM

## 2024-04-02 DIAGNOSIS — I712 Thoracic aortic aneurysm, without rupture, unspecified: Secondary | ICD-10-CM | POA: Diagnosis not present

## 2024-04-02 DIAGNOSIS — N189 Chronic kidney disease, unspecified: Secondary | ICD-10-CM | POA: Diagnosis not present

## 2024-04-02 LAB — BASIC METABOLIC PANEL WITH GFR
Anion gap: 13 (ref 5–15)
BUN: 42 mg/dL — ABNORMAL HIGH (ref 8–23)
CO2: 22 mmol/L (ref 22–32)
Calcium: 8.9 mg/dL (ref 8.9–10.3)
Chloride: 107 mmol/L (ref 98–111)
Creatinine, Ser: 4.43 mg/dL — ABNORMAL HIGH (ref 0.44–1.00)
GFR, Estimated: 10 mL/min — ABNORMAL LOW (ref >60)
Glucose, Bld: 96 mg/dL (ref 70–99)
Potassium: 4 mmol/L (ref 3.5–5.1)
Sodium: 142 mmol/L (ref 135–145)

## 2024-04-02 LAB — GLUCOSE, CAPILLARY
Glucose-Capillary: 103 mg/dL — ABNORMAL HIGH (ref 70–99)
Glucose-Capillary: 103 mg/dL — ABNORMAL HIGH (ref 70–99)
Glucose-Capillary: 104 mg/dL — ABNORMAL HIGH (ref 70–99)
Glucose-Capillary: 167 mg/dL — ABNORMAL HIGH (ref 70–99)
Glucose-Capillary: 92 mg/dL (ref 70–99)
Glucose-Capillary: 133 mg/dL — ABNORMAL HIGH (ref 70–99)

## 2024-04-02 LAB — CBC
HCT: 31.5 % — ABNORMAL LOW (ref 36.0–46.0)
Hemoglobin: 10.2 g/dL — ABNORMAL LOW (ref 12.0–15.0)
MCH: 32.4 pg (ref 26.0–34.0)
MCHC: 32.4 g/dL (ref 30.0–36.0)
MCV: 100 fL (ref 80.0–100.0)
Platelets: 163 10*3/uL (ref 150–400)
RBC: 3.15 MIL/uL — ABNORMAL LOW (ref 3.87–5.11)
RDW: 13 % (ref 11.5–15.5)
WBC: 5.4 10*3/uL (ref 4.0–10.5)
nRBC: 0 % (ref 0.0–0.2)

## 2024-04-02 LAB — IRON AND TIBC
Iron: 35 ug/dL (ref 28–170)
Saturation Ratios: 13 % (ref 10.4–31.8)
TIBC: 277 ug/dL (ref 250–450)
UIBC: 242 ug/dL

## 2024-04-02 LAB — ECHOCARDIOGRAM COMPLETE
Area-P 1/2: 5.31 cm2
Est EF: 55
Height: 68 in
P 1/2 time: 962 ms
S' Lateral: 3.2 cm
Weight: 2152 [oz_av]

## 2024-04-02 LAB — PATHOLOGIST SMEAR REVIEW

## 2024-04-02 LAB — FERRITIN: Ferritin: 28 ng/mL (ref 11–307)

## 2024-04-02 NOTE — Evaluation (Signed)
 Occupational Therapy Evaluation Patient Details Name: Mary Stephens MRN: [id number] DOB: 18-Apr-1956 Today's Date: 04/02/2024   History of Present Illness   Pt is a 68 y.o. female presenting 4/19 with shortness of breath, chest pain, tachypnea. Pt with BP 254/167. Hypertensive emergency and respiratory failure. Placed on bipap initially.  PMH significant for DM, hep C, current smoker, HTN, hypercholesterolemia, aortic dissection repair 2021, COPD.     Clinical Impressions PTA, pt lived alone and was independent within her home. Daughter assists with grocery shopping. Upon eval, pt with decreased activity tolerance as compared to baseline and reporting back pain. Pt encouraged for frequent mobility to reduce pain and optimize return to PLOF with pt reporting understanding. Pt requesting recommendation for hospital bed; was able to get out of bed with HOB slightly elevated as she reports she sleeps on several blankets at home secondary to reduced tolerance of lying flat. Pt able to perform bed mobility CGA with increased time and effort to elevate trunk. Will defer recommendation to MD. No further acute needs identified; recommend follow by mobility specialist team acutely to optimize activity tolerance.      If plan is discharge home, recommend the following:   Other (comment) (on pt request)     Functional Status Assessment   Patient has had a recent decline in their functional status and demonstrates the ability to make significant improvements in function in a reasonable and predictable amount of time.     Equipment Recommendations   None recommended by OT     Recommendations for Other Services         Precautions/Restrictions   Precautions Precautions: None Restrictions Weight Bearing Restrictions Per Provider Order: No     Mobility Bed Mobility Overal bed mobility: Needs Assistance Bed Mobility: Supine to Sit     Supine to sit: Contact guard     General  bed mobility comments: effortful, increased time.    Transfers Overall transfer level: Modified independent Equipment used: None                      Balance                                           ADL either performed or assessed with clinical judgement   ADL Overall ADL's : Modified independent                                       General ADL Comments: increased time. Educated regarding compensatory techniques for LB ADL secondary to pt reporting back pain.     Vision         Perception         Praxis         Pertinent Vitals/Pain Pain Assessment Pain Assessment: Faces Faces Pain Scale: Hurts a little bit Pain Location: bil calves Pain Descriptors / Indicators: Sore Pain Intervention(s): Limited activity within patient's tolerance, Monitored during session     Extremity/Trunk Assessment Upper Extremity Assessment Upper Extremity Assessment: Overall WFL for tasks assessed   Lower Extremity Assessment Lower Extremity Assessment: Defer to PT evaluation       Communication Communication Communication: No apparent difficulties   Cognition Arousal: Alert Behavior During Therapy: WFL for tasks assessed/performed Cognition: No apparent impairments  Following commands: Intact       Cueing  General Comments   Cueing Techniques: Verbal cues  Instructed in repeat sit to stand but pt declined practice   Exercises     Shoulder Instructions      Home Living Family/patient expects to be discharged to:: Private residence Living Arrangements: Alone   Type of Home: Apartment Home Access: Level entry     Home Layout: One level     Bathroom Shower/Tub: Chief Strategy Officer: Standard     Home Equipment: None          Prior Functioning/Environment Prior Level of Function : Independent/Modified Independent;Driving                    OT  Problem List: Decreased strength;Impaired balance (sitting and/or standing);Decreased activity tolerance;Decreased knowledge of use of DME or AE;Pain   OT Treatment/Interventions:        OT Goals(Current goals can be found in the care plan section)   Acute Rehab OT Goals Patient Stated Goal: get better OT Goal Formulation: With patient Time For Goal Achievement: 04/16/24 Potential to Achieve Goals: Good   OT Frequency:       Co-evaluation              AM-PAC OT "6 Clicks" Daily Activity     Outcome Measure Help from another person eating meals?: None Help from another person taking care of personal grooming?: None Help from another person toileting, which includes using toliet, bedpan, or urinal?: None Help from another person bathing (including washing, rinsing, drying)?: None Help from another person to put on and taking off regular upper body clothing?: None Help from another person to put on and taking off regular lower body clothing?: None 6 Click Score: 24   End of Session Equipment Utilized During Treatment: Gait belt Nurse Communication: Mobility status  Activity Tolerance: Patient tolerated treatment well Patient left: in bed;with call bell/phone within reach  OT Visit Diagnosis: Unsteadiness on feet (R26.81);Muscle weakness (generalized) (M62.81);Pain Pain - part of body:  (bil calves, back)                Time: 1610-9604 OT Time Calculation (min): 22 min Charges:  OT General Charges $OT Visit: 1 Visit OT Evaluation $OT Eval Low Complexity: 1 Low  Karilyn Ouch, OTR/L [facility] Acute Rehabilitation Office: [phone]   Emery Hans 04/02/2024, 5:21 PM

## 2024-04-02 NOTE — Progress Notes (Signed)
 Patient is a participant of [facility]. [phone], [phone] Mylinda Asa is contact at [facility]

## 2024-04-02 NOTE — Progress Notes (Signed)
 PT Cancellation Note  Patient Details Name: Mary Stephens MRN: [id number] DOB: [birth year]   Cancelled Treatment:    Reason Eval/Treat Not Completed: Patient declined, no reason specified. Attempted twice and pt stated she didn't feel like walking and to come back later.   Pura Browns [facility] 04/02/2024, 1:30 PM  Angelina Kempf PT Acute [employer] [phone]

## 2024-04-02 NOTE — Progress Notes (Signed)
  Echocardiogram 2D Echocardiogram has been performed.  Fain Home RDCS 04/02/2024, 9:16 AM

## 2024-04-02 NOTE — Evaluation (Signed)
 Physical Therapy Brief Evaluation and Discharge Note Patient Details Name: Mary Stephens MRN: [id number] DOB: [birth year] Today's Date: 04/02/2024   History of Present Illness  Pt is a 68 y.o. female presenting 4/19 with shortness of breath, chest pain, tachypnea. Pt with BP 254/167. Hypertensive emergency and respiratory failure. Placed on bipap initially.  PMH significant for DM, hep C, current smoker, HTN, hypercholesterolemia, aortic dissection repair 2021, COPD.  Clinical Impression  Pt doing well with mobility and no further PT needed.  Ready for dc from PT standpoint. Pt requesting hospital bed for home stating she can't lie flat. Did not elaborate on why she couldn't lie flat. She states she piles up blankets etc to keep head up at home. She declined to try getting out of flat bed for me. Reports she feels weak. Instructed in repeat sit to stand ex's at home. She declined practice during session. Pt has been mobilizing in room on her own. Will also ask mobility team to see to make sure she continues to amb longer distances while she is here.         PT Assessment Patient does not need any further PT services  Assistance Needed at Discharge  None    Equipment Recommendations None recommended by PT  Recommendations for Other Services       Precautions/Restrictions Precautions Precautions: None Restrictions Weight Bearing Restrictions Per Provider Order: No        Mobility  Bed Mobility   Supine/Sidelying to sit: Modified independent (Device/Increased time), HOB elevated Sit to supine/sidelying: Modified independent (Device/Increased time), HOB elevated General bed mobility comments: Pt refused to attempt in/out of flat bed stating she couldn't lie flat  Transfers Overall transfer level: Modified independent Equipment used: None                    Ambulation/Gait Ambulation/Gait assistance: Modified independent (Device/Increase time) Gait Distance (Feet): 200  Feet Assistive device: None Gait Pattern/deviations: Decreased stride length, Narrow base of support, Drifts right/left Gait Speed: Below normal General Gait Details: No loss of balance. Slight drift. Reports her legs are sore from spasms yesterday  Home Activity Instructions    Stairs            Modified Rankin (Stroke Patients Only)        Balance Overall balance assessment: Mild deficits observed, not formally tested                        Pertinent Vitals/Pain PT - Brief Vital Signs All Vital Signs Stable: Yes Pain Assessment Pain Assessment: Faces Faces Pain Scale: Hurts a little bit Pain Location: bil calves Pain Descriptors / Indicators: Sore Pain Intervention(s): Limited activity within patient's tolerance     Home Living Family/patient expects to be discharged to:: Private residence Living Arrangements: Alone Available Help at Discharge: Family;Available PRN/intermittently Home Environment: Level entry   Home Equipment: None        Prior Function Level of Independence: Independent Comments: No assistive device, Drives    UE/LE Assessment   UE ROM/Strength/Tone/Coordination: WFL    LE ROM/Strength/Tone/Coordination: [facility]      Communication   Communication Communication: No apparent difficulties     Cognition Overall Cognitive Status: Appears within functional limits for tasks assessed/performed       General Comments General comments (skin integrity, edema, etc.): Instructed in repeat sit to stand but pt declined practice    Exercises     Assessment/Plan    PT  Problem List         PT Visit Diagnosis Muscle weakness (generalized) (M62.81)    No Skilled PT Patient is modified independent with all activity/mobility   Co-evaluation                AMPAC 6 Clicks Help needed turning from your back to your side while in a flat bed without using bedrails?: None Help needed moving from lying on your back to sitting  on the side of a flat bed without using bedrails?: None Help needed moving to and from a bed to a chair (including a wheelchair)?: None Help needed standing up from a chair using your arms (e.g., wheelchair or bedside chair)?: None Help needed to walk in hospital room?: None Help needed climbing 3-5 steps with a railing? : A Little 6 Click Score: 23      End of Session   Activity Tolerance: Patient tolerated treatment well Patient left: in bed;with call bell/phone within reach   PT Visit Diagnosis: Muscle weakness (generalized) (M62.81)     Time: 1610-9604 PT Time Calculation (min) (ACUTE ONLY): 10 min  Charges:   PT Evaluation $PT Eval Low Complexity: 1 Low      [facility] PT Acute Rehabilitation Services Office [phone]   Pura Browns [facility]  04/02/2024, 3:25 PM

## 2024-04-02 NOTE — Progress Notes (Signed)
 [facility] PROGRESS NOTE   Mary Stephens ZOX:[id number] DOB: [birth year] DOA: 03/31/2024  PCP: System, Provider Not In  Brief History: 68 y/o female with PMH for a type a aortic dissection repair by Dr. Deloise Ferries in 2021. She is a diabetic which is diet controlled. She has a history of hepatitis C. She is a current smoker. She is medically managed for hypertension. She takes a statin for hypercholesterolemia, who was BIB EMS for SOB and chest pain.  Patient was admitted to the ICU and placed on BiPAP.  After stabilization she was transferred to the floor.  Consultants: Pulmonology  Procedures: Echocardiogram    Subjective/Interval History: Patient mentions that she feels much better compared to how she was when she came into the hospital.  Has been urinating.  Denies any chest pain.  Shortness of breath is improved.  No nausea or vomiting.    Assessment/Plan:  Hypertensive urgency/emergency Patient required esmolol  infusion briefly. Currently noted to be on amlodipine , carvedilol , hydralazine . Blood pressure much better controlled.  Continue current regimen for now.  Demand ischemia Elevated Troponin most likely secondary to uncontrolled hypertension.  Patient denied having any chest pain. EKG showed change suggesting LVH. Echocardiogram showed LVEF of 55% without any regional wall motion abnormalities.  Moderate LVH noted.  Grade 1 diastolic dysfunction noted.  RV function noted to be normal.  No significant valvular abnormalities noted.  Small pericardial effusion was noted. Will check TSH. Will benefit from outpatient cardiology evaluation.  Previous history of aortic dissection s/p repair Stable.  CT scan showed slightly increased aneurysmal prominence in the proximal aortic arch. Followed by Dr. Deloise Ferries.  Last seen in January. Will need to follow-up with CTS in the next few weeks.  Acute on chronic kidney disease stage IV Creatinine was 2.45 in May 2024 Came  in with creatinine of 4.1.  Creatinine noted to be 4.43 today.  Patient reports good urine output.  Avoid nephrotoxic agents. Check UA. Not seen by nephrology on a regular basis.  If renal function remains stable over the next 24 hours then she can just be referred to nephrology in the outpatient setting.  If renal function continues to worsen then she may need to be seen by nephrology here in the hospital. Renal ultrasound showed chronic medical renal disease without any acute abnormalities.  Normocytic anemia Likely anemia of chronic disease.   DVT Prophylaxis: Subcutaneous heparin  Code Status: Full code Family Communication: Discussed with patient  Disposition Plan: Hopefully home when improved     Medications: Scheduled:  amLODipine   10 mg Oral Daily   aspirin  EC  325 mg Oral Daily   carvedilol   25 mg Oral BID WC   Chlorhexidine  Gluconate Cloth  6 each Topical Daily   gabapentin   300 mg Oral q AM   heparin   5,000 Units Subcutaneous Q8H   hydrALAZINE   100 mg Oral TID   insulin  aspart  0-15 Units Subcutaneous Q4H   methocarbamol   500 mg Oral TID   nicotine   14 mg Transdermal Daily   phenylephrine   1 suppository Rectal BID   Continuous: PRN:acetaminophen , camphor-menthol , diazepam , docusate sodium , labetalol , ondansetron  (ZOFRAN ) IV, oxyCODONE -acetaminophen , polyethylene glycol  Antibiotics: Anti-infectives (From admission, onward)    Start     Dose/Rate Route Frequency Ordered Stop   03/31/24 0700  cefTRIAXone  (ROCEPHIN ) 2 g in sodium chloride  0.9 % 100 mL IVPB        2 g 200 mL/hr over 30 Minutes Intravenous Every 24 hours 03/31/24 0647 04/02/24 0641  03/31/24 0700  azithromycin  (ZITHROMAX ) 500 mg in sodium chloride  0.9 % 250 mL IVPB        500 mg 250 mL/hr over 60 Minutes Intravenous Every 24 hours 03/31/24 0648 04/02/24 0746       Objective:  Vital Signs  Vitals:   04/02/24 0007 04/02/24 0300 04/02/24 0500 04/02/24 0827  BP: (!) 137/96 (!) 142/86  (!) 141/94   Pulse: 76 72  80  Resp: 15 17 13 20   Temp: 98 F (36.7 C) 98 F (36.7 C)  97.6 F (36.4 C)  TempSrc: Oral Oral  Oral  SpO2: 100% 97%  96%  Weight:   61 kg   Height:        Intake/Output Summary (Last 24 hours) at 04/02/2024 1034 Last data filed at 04/02/2024 0828 Gross per 24 hour  Intake 720 ml  Output --  Net 720 ml   Filed Weights   03/31/24 0408 04/01/24 0435 04/02/24 0500  Weight: 64.4 kg 53.3 kg 61 kg    General appearance: Awake alert.  In no distress Resp: Clear to auscultation bilaterally.  Normal effort Cardio: S1-S2 is normal regular.  No S3-S4.  No rubs murmurs or bruit GI: Abdomen is soft.  Nontender nondistended.  Bowel sounds are present normal.  No masses organomegaly Extremities: No edema.  Full range of motion of lower extremities. Neurologic: Alert and oriented x3.  No focal neurological deficits.    Lab Results:  Data Reviewed: I have personally reviewed following labs and reports of the imaging studies  CBC: Recent Labs  Lab 03/31/24 0418 03/31/24 0428 03/31/24 0600 04/01/24 0322 04/02/24 0407  WBC  --  13.0*  --  6.7 5.4  NEUTROABS  --  4.7  --   --   --   HGB 12.2 11.4* 11.2* 10.0* 10.2*  HCT 36.0 36.5 33.0* 30.3* 31.5*  MCV  --  104.6*  --  98.1 100.0  PLT  --  193  --  155 163    Basic Metabolic Panel: Recent Labs  Lab 03/31/24 0414 03/31/24 0418 03/31/24 0428 03/31/24 0600 04/01/24 0322 04/02/24 0407  NA 144  145 143 143 144 141 142  K 3.6  3.5 3.6 3.4* 3.2* 3.4* 4.0  CL 112*  --  110  --  107 107  CO2  --   --  19*  --  23 22  GLUCOSE 175*  --  180*  --  99 96  BUN 48*  --  37*  --  41* 42*  CREATININE 4.10*  --  3.78*  --  3.97* 4.43*  CALCIUM   --   --  8.3*  --  8.8* 8.9  MG  --   --   --   --  2.2  --     GFR: Estimated Creatinine Clearance: 11.4 mL/min (A) (by C-G formula based on SCr of 4.43 mg/dL (H)).  CBG: Recent Labs  Lab 04/01/24 1324 04/01/24 1628 04/01/24 2036 04/02/24 0059 04/02/24 0342   GLUCAP 125* 120* 113* 103* 103*    Lipid Profile: Recent Labs    04/01/24 0322  TRIG 52    Anemia Panel: Recent Labs    04/02/24 0407  FERRITIN 28  TIBC 277  IRON 35    Recent Results (from the past 240 hours)  MRSA Next Gen by PCR, Nasal     Status: None   Collection Time: 03/31/24  8:05 AM   Specimen: Nasal Mucosa; Nasal Swab  Result Value Ref  Range Status   MRSA by PCR Next Gen NOT DETECTED NOT DETECTED Final    Comment: (NOTE) The GeneXpert MRSA Assay (FDA approved for NASAL specimens only), is one component of a comprehensive MRSA colonization surveillance program. It is not intended to diagnose MRSA infection nor to guide or monitor treatment for MRSA infections. Test performance is not FDA approved in patients less than 8 years old. Performed at [facility], [address], [place], [place] [address]       Radiology Studies: ECHOCARDIOGRAM COMPLETE Result Date: 04/02/2024    ECHOCARDIOGRAM REPORT   Patient Name:   Mary Stephens Date of Exam: 04/02/2024 Medical Rec #:  [id number]      Height:       68.0 in Accession #:    [id number]     Weight:       134.5 lb Date of Birth:  08/04/1956      BSA:          1.727 m Patient Age:    68 years       BP:           141/94 mmHg Patient Gender: F              HR:           79 bpm. Exam Location:  Inpatient Procedure: 2D Echo, Color Doppler and Cardiac Doppler (Both Spectral and Color            Flow Doppler were utilized during procedure). Indications:    CHF I50.9  History:        Patient has prior history of Echocardiogram examinations, most                 recent 12/02/2020. Aortic Dissection repair 2021; Risk                 Factors:Diabetes, Dyslipidemia and Hypertension.  Sonographer:    Hersey Lorenzo RDCS Referring Phys: [id number] NAVEED [facility] IMPRESSIONS  1. Left ventricular ejection fraction, by estimation, is 55%. The left ventricle has normal function. The left ventricle has no regional wall motion abnormalities.  There is moderate concentric left ventricular hypertrophy. Left ventricular diastolic parameters are consistent with Grade I diastolic dysfunction (impaired relaxation).  2. Right ventricular systolic function is normal. The right ventricular size is normal. Tricuspid regurgitation signal is inadequate for assessing PA pressure.  3. Left atrial size was severely dilated.  4. The mitral valve is normal in structure. Mild mitral valve regurgitation. No evidence of mitral stenosis.  5. The aortic valve is tricuspid. There is mild calcification of the aortic valve. Aortic valve regurgitation is mild. No aortic stenosis is present.  6. The inferior vena cava is normal in size with greater than 50% respiratory variability, suggesting right atrial pressure of 3 mmHg.  7. A small pericardial effusion is present.  8. Possible residual dissection visualized in the abdominal aorta. FINDINGS  Left Ventricle: Left ventricular ejection fraction, by estimation, is 55%. The left ventricle has normal function. The left ventricle has no regional wall motion abnormalities. The left ventricular internal cavity size was normal in size. There is moderate concentric left ventricular hypertrophy. Left ventricular diastolic parameters are consistent with Grade I diastolic dysfunction (impaired relaxation). Right Ventricle: The right ventricular size is normal. No increase in right ventricular wall thickness. Right ventricular systolic function is normal. Tricuspid regurgitation signal is inadequate for assessing PA pressure. Left Atrium: Left atrial size was severely dilated. Right Atrium: Right  atrial size was normal in size. Pericardium: A small pericardial effusion is present. Mitral Valve: The mitral valve is normal in structure. Mild mitral valve regurgitation. No evidence of mitral valve stenosis. Tricuspid Valve: The tricuspid valve is normal in structure. Tricuspid valve regurgitation is trivial. Aortic Valve: The aortic valve is  tricuspid. There is mild calcification of the aortic valve. Aortic valve regurgitation is mild. Aortic regurgitation PHT measures 962 msec. No aortic stenosis is present. Pulmonic Valve: The pulmonic valve was normal in structure. Pulmonic valve regurgitation is not visualized. Aorta: The aortic root is normal in size and structure. Venous: The inferior vena cava is normal in size with greater than 50% respiratory variability, suggesting right atrial pressure of 3 mmHg. IAS/Shunts: No atrial level shunt detected by color flow Doppler.  LEFT VENTRICLE PLAX 2D LVIDd:         5.10 cm   Diastology LVIDs:         3.20 cm   LV e' medial:    3.59 cm/s LV PW:         1.70 cm   LV E/e' medial:  16.0 LV IVS:        1.70 cm   LV e' lateral:   3.48 cm/s LVOT diam:     2.10 cm   LV E/e' lateral: 16.5 LV SV:         69 LV SV Index:   40 LVOT Area:     3.46 cm  RIGHT VENTRICLE             IVC RV S prime:     11.50 cm/s  IVC diam: 1.40 cm TAPSE (M-mode): 1.3 cm LEFT ATRIUM              Index        RIGHT ATRIUM           Index LA diam:        3.70 cm  2.14 cm/m   RA Area:     15.30 cm LA Vol (A2C):   105.0 ml 60.81 ml/m  RA Volume:   37.80 ml  21.89 ml/m LA Vol (A4C):   93.0 ml  53.86 ml/m LA Biplane Vol: 109.0 ml 63.13 ml/m  AORTIC VALVE LVOT Vmax:   93.30 cm/s LVOT Vmean:  59.800 cm/s LVOT VTI:    0.200 m AI PHT:      962 msec  AORTA Ao Root diam: 3.40 cm Ao Asc diam:  2.90 cm MITRAL VALVE MV Area (PHT): 5.31 cm    SHUNTS MV Decel Time: 143 msec    Systemic VTI:  0.20 m MV E velocity: 57.40 cm/s  Systemic Diam: 2.10 cm MV A velocity: 67.70 cm/s MV E/A ratio:  0.85 Dalton McleanMD Electronically signed by Archer Bear Signature Date/Time: 04/02/2024/9:21:39 AM    Final    US  RENAL Result Date: 04/01/2024 CLINICAL DATA:  [id number] AKI (acute kidney injury) (HCC) [id number] EXAM: RENAL / URINARY TRACT ULTRASOUND COMPLETE COMPARISON:  05/06/2023. FINDINGS: The right kidney measured 10.1 cm and the left kidney measured 7.1 cm.  The kidneys demonstrate increased echogenicity consistent with chronic medical renal disease. Midpole cyst on the left measures 2.4 cm. Upper pole cyst on the right measures 1.5 cm. No follow up recommended for the cysts. No shadowing stones are seen. No hydronephrosis identified. Bladder: Appears normal for degree of bladder distention. IMPRESSION: Echogenic kidneys consistent with chronic medical renal disease. No acute abnormalities. Electronically Signed   By: Judithann Novas.D.  On: 04/01/2024 14:41       LOS: 2 days   Trystin Hargrove Lyndon Santiago  [facility] Pager on [email or web address]  04/02/2024, 10:34 AM

## 2024-04-03 ENCOUNTER — Other Ambulatory Visit (HOSPITAL_COMMUNITY): Payer: Self-pay

## 2024-04-03 DIAGNOSIS — I161 Hypertensive emergency: Secondary | ICD-10-CM | POA: Diagnosis not present

## 2024-04-03 LAB — BASIC METABOLIC PANEL WITH GFR
Anion gap: 13 (ref 5–15)
BUN: 45 mg/dL — ABNORMAL HIGH (ref 8–23)
CO2: 19 mmol/L — ABNORMAL LOW (ref 22–32)
Calcium: 8.8 mg/dL — ABNORMAL LOW (ref 8.9–10.3)
Chloride: 107 mmol/L (ref 98–111)
Creatinine, Ser: 4.23 mg/dL — ABNORMAL HIGH (ref 0.44–1.00)
GFR, Estimated: 11 mL/min — ABNORMAL LOW (ref >60)
Glucose, Bld: 80 mg/dL (ref 70–99)
Potassium: 4.2 mmol/L (ref 3.5–5.1)
Sodium: 139 mmol/L (ref 135–145)

## 2024-04-03 LAB — CBC
HCT: 32.8 % — ABNORMAL LOW (ref 36.0–46.0)
Hemoglobin: 10.8 g/dL — ABNORMAL LOW (ref 12.0–15.0)
MCH: 32.7 pg (ref 26.0–34.0)
MCHC: 32.9 g/dL (ref 30.0–36.0)
MCV: 99.4 fL (ref 80.0–100.0)
Platelets: 176 10*3/uL (ref 150–400)
RBC: 3.3 MIL/uL — ABNORMAL LOW (ref 3.87–5.11)
RDW: 12.9 % (ref 11.5–15.5)
WBC: 4.7 10*3/uL (ref 4.0–10.5)
nRBC: 0 % (ref 0.0–0.2)

## 2024-04-03 LAB — GLUCOSE, CAPILLARY
Glucose-Capillary: 98 mg/dL (ref 70–99)
Glucose-Capillary: 92 mg/dL (ref 70–99)

## 2024-04-03 LAB — TSH: TSH: 1.633 u[IU]/mL (ref 0.350–4.500)

## 2024-04-03 MED ORDER — CARVEDILOL 25 MG PO TABS
25.0000 mg | ORAL_TABLET | Freq: Two times a day (BID) | ORAL | 2 refills | Status: DC
  Filled 2024-04-03: qty 60, 30d supply, fill #0

## 2024-04-03 MED ORDER — METHOCARBAMOL 500 MG PO TABS
500.0000 mg | ORAL_TABLET | Freq: Three times a day (TID) | ORAL | 0 refills | Status: DC | PRN
  Filled 2024-04-03: qty 20, 7d supply, fill #0

## 2024-04-03 MED ORDER — AMLODIPINE BESYLATE 10 MG PO TABS
10.0000 mg | ORAL_TABLET | Freq: Every day | ORAL | 1 refills | Status: AC
  Filled 2024-04-03: qty 30, 30d supply, fill #0

## 2024-04-03 MED ORDER — HYDRALAZINE HCL 100 MG PO TABS
100.0000 mg | ORAL_TABLET | Freq: Three times a day (TID) | ORAL | 2 refills | Status: DC
  Filled 2024-04-03: qty 90, 30d supply, fill #0

## 2024-04-03 NOTE — Progress Notes (Signed)
 Heart Failure Navigator Progress Note  Assessed for Heart & Vascular TOC clinic readiness.  Patient does not meet criteria due to EF 55%, . No TOC - hospitalization more related to HTN emergency.  Navigator will sign off at this time.   Randie Bustle, BSN, Scientist, clinical (histocompatibility and immunogenetics) Only

## 2024-04-03 NOTE — TOC Transition Note (Signed)
 Transition of Care [facility]) - Discharge Note   Patient Details  Name: Mary Stephens MRN: [id number] Date of Birth: 04/02/1956  [facility]) CM/SW Contact:  Omie Bickers, RN Phone Number: 04/03/2024, 10:07 AM   Clinical Narrative:     [name] w patient at bedside. She states that her daughter will picking her up.  No HH or DME needs  They have already notified [facility] of hospital stay.  No TOC needs   Final next level of care: Home/Self Care Barriers to Discharge: No Barriers Identified   Patient Goals and CMS Choice            Discharge Placement                       Discharge Plan and Services Additional resources added to the After Visit Summary for                                       Social Drivers of Health (SDOH) Interventions SDOH Screenings   Food Insecurity: Food Insecurity Present (03/31/2024)  Housing: High Risk (03/31/2024)  Transportation Needs: No Transportation Needs (03/31/2024)  Utilities: Not At Risk (03/31/2024)  Social Connections: Socially Isolated (03/31/2024)  Tobacco Use: High Risk (04/01/2024)     Readmission Risk Interventions     No data to display

## 2024-04-03 NOTE — Discharge Summary (Signed)
 [facility]  Physician Discharge Summary   Patient ID: Mary Stephens MRN: [id number] DOB/AGE: 1956/09/30 68 y.o.  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 Deloise Ferries in 2021. She is a diabetic which is diet controlled. She has a history of hepatitis C. She is a current smoker. She is medically managed for hypertension. She takes a statin for hypercholesterolemia, who was BIB EMS for SOB and chest pain.  Patient was admitted to the ICU and placed on BiPAP.  After stabilization she was transferred to the floor.   Consultants: CCM   Procedures: Echocardiogram  HOSPITAL COURSE:   Hypertensive urgency/emergency Patient required esmolol  infusion briefly. Currently noted to be on amlodipine , carvedilol , hydralazine . Blood pressure much better controlled.  Continue current regimen for now. Compliance was emphasized to the patient.   Demand ischemia Elevated Troponin most likely secondary to uncontrolled hypertension.  Patient denied having any chest pain. EKG showed change suggesting LVH. Echocardiogram showed LVEF of 55% without any regional wall motion abnormalities.  Moderate LVH noted.  Grade 1 diastolic dysfunction noted.  RV function noted to be normal.  No significant valvular abnormalities noted.  Small pericardial effusion was noted. TSH is  normal. Will benefit from outpatient cardiology evaluation.  Referral has been sent.   Previous history of aortic dissection s/p repair Stable.  CT scan showed slightly increased aneurysmal prominence in the proximal aortic arch. Followed by Dr. Deloise Ferries.  Last seen in January. Will need to follow-up with CTS in the next few weeks.  Message sent to cardiothoracic to arrange outpatient follow-up   Acute on chronic kidney disease stage IV Creatinine was 2.45 in May 2024 Came in with creatinine of 4.1.   Creatinine stable for the most part at discharge.  Seems to have a higher baseline.  Suggesting progression of CKD.   Message sent to nephrology to arrange outpatient follow-up.  She has been told to discuss with her PCP if she does not hear from nephrology office in the next 1 week.   Renal ultrasound showed chronic medical renal disease without any acute abnormalities.   Normocytic anemia Likely anemia of chronic disease.   Patient is stable.  Has ambulated without difficulty.  Okay for discharge home today.  PERTINENT LABS:  The results of significant diagnostics from this hospitalization (including imaging, microbiology, ancillary and laboratory) are listed below for reference.    Microbiology: Recent Results (from the past 240 hours)  MRSA Next Gen by PCR, Nasal     Status: None   Collection Time: 03/31/24  8:05 AM   Specimen: Nasal Mucosa; Nasal Swab  Result Value Ref Range Status   MRSA by PCR Next Gen NOT DETECTED NOT DETECTED Final    Comment: (NOTE) The GeneXpert MRSA Assay (FDA approved for NASAL specimens only), is one component of a comprehensive MRSA colonization surveillance program. It is not intended to diagnose MRSA infection nor to guide or monitor treatment for MRSA infections. Test performance is  not FDA approved in patients less than 67 years old. Performed at [facility], [address] [place], [place] [address]      Labs:   Basic Metabolic  Panel: Recent Labs  Lab 03/31/24 0414 03/31/24 0418 03/31/24 0428 03/31/24 0600 04/01/24 0322 04/02/24 0407 04/03/24 0501  NA 144  145   < > 143 144 141 142 139  K 3.6  3.5   < > 3.4* 3.2* 3.4* 4.0 4.2  CL 112*  --  110  --  107 107 107  CO2  --   --  19*  --  23 22 19*  GLUCOSE 175*  --  180*  --  99 96 80  BUN 48*  --  37*  --  41* 42* 45*  CREATININE 4.10*  --  3.78*  --  3.97* 4.43* 4.23*  CALCIUM   --   --  8.3*  --  8.8* 8.9 8.8*  MG  --   --   --   --  2.2  --   --    < > = values in this interval not displayed.    CBC: Recent Labs  Lab 03/31/24 0428 03/31/24 0600 04/01/24 0322 04/02/24 0407 04/03/24 0810  WBC 13.0*  --  6.7 5.4 4.7  NEUTROABS 4.7  --   --   --   --   HGB 11.4* 11.2* 10.0* 10.2* 10.8*  HCT 36.5 33.0* 30.3* 31.5* 32.8*  MCV 104.6*  --  98.1 100.0 99.4  PLT 193  --  155 163 176    CBG: Recent Labs  Lab 04/02/24 1659 04/02/24 2037 04/02/24 2307 04/03/24 0329 04/03/24 0903  GLUCAP 167* 92 133* 98 92     IMAGING STUDIES ECHOCARDIOGRAM COMPLETE Result Date: 04/02/2024    ECHOCARDIOGRAM REPORT   Patient Name:   Mary Stephens Date of Exam: 04/02/2024 Medical Rec #:  [id number]      Height:       68.0 in Accession #:    [id number]     Weight:       134.5 lb Date of Birth:  [birth year]      BSA:          1.727 m Patient Age:    68 years       BP:           141/94 mmHg Patient Gender: F              HR:           79 bpm. Exam Location:  Inpatient Procedure: 2D Echo, Color Doppler and Cardiac Doppler (Both Spectral and Color            Flow Doppler were utilized during procedure). Indications:    CHF I50.9  History:        Patient has prior history of Echocardiogram examinations, most                 recent 12/02/2020. Aortic Dissection repair 2021; Risk                 Factors:Diabetes, Dyslipidemia and Hypertension.  Sonographer:    Hersey Lorenzo RDCS Referring Phys: [id number] NAVEED [facility] IMPRESSIONS  1. Left ventricular ejection fraction, by estimation,  is 55%. The left ventricle has normal function. The left ventricle has no regional wall motion abnormalities. There is moderate concentric left ventricular hypertrophy. Left ventricular diastolic parameters are consistent with Grade I diastolic dysfunction (impaired relaxation).  2. Right  ventricular systolic function is normal. The right ventricular size is normal. Tricuspid regurgitation signal is inadequate for assessing PA pressure.  3. Left atrial size was severely dilated.  4. The mitral valve is normal in structure. Mild mitral valve regurgitation. No evidence of mitral stenosis.  5. The aortic valve is tricuspid. There is mild calcification of the aortic valve. Aortic valve regurgitation is mild. No aortic stenosis is present.  6. The inferior vena cava is normal in size with greater than 50% respiratory variability, suggesting right atrial pressure of 3 mmHg.  7. A small pericardial effusion is present.  8. Possible residual dissection visualized in the abdominal aorta. FINDINGS  Left Ventricle: Left ventricular ejection fraction, by estimation, is 55%. The left ventricle has normal function. The left ventricle has no regional wall motion abnormalities. The left ventricular internal cavity size was normal in size. There is moderate concentric left ventricular hypertrophy. Left ventricular diastolic parameters are consistent with Grade I diastolic dysfunction (impaired relaxation). Right Ventricle: The right ventricular size is normal. No increase in right ventricular wall thickness. Right ventricular systolic function is normal. Tricuspid regurgitation signal is inadequate for assessing PA pressure. Left Atrium: Left atrial size was severely dilated. Right Atrium: Right atrial size was normal in size. Pericardium: A small pericardial effusion is present. Mitral Valve: The mitral valve is normal in structure. Mild mitral valve regurgitation. No evidence of mitral valve stenosis. Tricuspid Valve: The  tricuspid valve is normal in structure. Tricuspid valve regurgitation is trivial. Aortic Valve: The aortic valve is tricuspid. There is mild calcification of the aortic valve. Aortic valve regurgitation is mild. Aortic regurgitation PHT measures 962 msec. No aortic stenosis is present. Pulmonic Valve: The pulmonic valve was normal in structure. Pulmonic valve regurgitation is not visualized. Aorta: The aortic root is normal in size and structure. Venous: The inferior vena cava is normal in size with greater than 50% respiratory variability, suggesting right atrial pressure of 3 mmHg. IAS/Shunts: No atrial level shunt detected by color flow Doppler.  LEFT VENTRICLE PLAX 2D LVIDd:         5.10 cm   Diastology LVIDs:         3.20 cm   LV e' medial:    3.59 cm/s LV PW:         1.70 cm   LV E/e' medial:  16.0 LV IVS:        1.70 cm   LV e' lateral:   3.48 cm/s LVOT diam:     2.10 cm   LV E/e' lateral: 16.5 LV SV:         69 LV SV Index:   40 LVOT Area:     3.46 cm  RIGHT VENTRICLE             IVC RV S prime:     11.50 cm/s  IVC diam: 1.40 cm TAPSE (M-mode): 1.3 cm LEFT ATRIUM              Index        RIGHT ATRIUM           Index LA diam:        3.70 cm  2.14 cm/m   RA Area:     15.30 cm LA Vol (A2C):   105.0 ml 60.81 ml/m  RA Volume:   37.80 ml  21.89 ml/m LA Vol (A4C):   93.0 ml  53.86 ml/m LA Biplane Vol: 109.0 ml 63.13 ml/m  AORTIC VALVE LVOT Vmax:  93.30 cm/s LVOT Vmean:  59.800 cm/s LVOT VTI:    0.200 m AI PHT:      962 msec  AORTA Ao Root diam: 3.40 cm Ao Asc diam:  2.90 cm MITRAL VALVE MV Area (PHT): 5.31 cm    SHUNTS MV Decel Time: 143 msec    Systemic VTI:  0.20 m MV E velocity: 57.40 cm/s  Systemic Diam: 2.10 cm MV A velocity: 67.70 cm/s MV E/A ratio:  0.85 Dalton McleanMD Electronically signed by Archer Bear Signature Date/Time: 04/02/2024/9:21:39 AM    Final    US  RENAL Result Date: 04/01/2024 CLINICAL DATA:  [id number] AKI (acute kidney injury) (HCC) [id number] EXAM: RENAL / URINARY TRACT ULTRASOUND  COMPLETE COMPARISON:  05/06/2023. FINDINGS: The right kidney measured 10.1 cm and the left kidney measured 7.1 cm. The kidneys demonstrate increased echogenicity consistent with chronic medical renal disease. Midpole cyst on the left measures 2.4 cm. Upper pole cyst on the right measures 1.5 cm. No follow up recommended for the cysts. No shadowing stones are seen. No hydronephrosis identified. Bladder: Appears normal for degree of bladder distention. IMPRESSION: Echogenic kidneys consistent with chronic medical renal disease. No acute abnormalities. Electronically Signed   By: Sydell Eva M.D.   On: 04/01/2024 14:41   CT Chest Wo Contrast Result Date: 03/31/2024 CLINICAL DATA:  Shortness breath, chest pain and diaphoresis. EXAM: CT CHEST WITHOUT CONTRAST TECHNIQUE: Multidetector CT imaging of the chest was performed following the standard protocol without IV contrast. RADIATION DOSE REDUCTION: This exam was performed according to the departmental dose-optimization program which includes automated exposure control, adjustment of the mA and/or kV according to patient size and/or use of iterative reconstruction technique. COMPARISON:  Portable chest from today, chest CTs without contrast 12/09/2023 and 11/15/2022. FINDINGS: Cardiovascular: There is mild cardiomegaly, small pericardial effusion interval increased now 7 mm thick. There are three-vessel coronary calcifications greatest in the circumflex coronary artery. Chronic enlargement of the pulmonary trunk indicating arterial hypertension and measuring 3.6 cm. There is prominence of the central pulmonary veins more than previously. 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 This extends from the sinotubular junction to the ascending aortic/arch junction. The the proximal arch distal to the graft today measures 4.3 cm on 3:68, previously 4.2 cm. Dilatation in the aortic isthmus is 4.9 cm craniocaudal on 6:94, increased from the last CT when this measured 4.2  cm. The descending aorta measures 3.9 cm AP on 6:102, stable. The distal descending aorta is 3.7 cm on 6:87, also stable. Mediastinum/Nodes: There is a 1.5 cm heterogeneous solid nodule posteriorly in the lower pole of the right thyroid lobe. Nonemergent follow-up ultrasound recommended. Axillary spaces are clear. No intrathoracic adenopathy is seen without contrast. There is a chronically patulous esophagus with a normal wall thickness. The trachea and main bronchi are patent. Lungs/Pleura: There are bilateral small layering pleural effusions. There is interlobular septal edema in the lung apices and bases consistent with CHF or fluid overload. There is diffuse bronchial thickening which could be congestive and/or bronchitic. There are bilateral ground-glass opacities with relative peripheral sparing showing a gravity dependent as well as a basal gradient. This is probably ground-glass edema but underlying pneumonia would be difficult to exclude. Upper Abdomen: Asymmetric left renal atrophy and cysts. Abdominal aortic atherosclerosis. Abdominal findings. Musculoskeletal: No chest wall mass or suspicious bone lesions identified. IMPRESSION: 1. Cardiomegaly with small pericardial effusion, interlobular septal edema, and small pleural effusions consistent with CHF or fluid overload. 2. Bilateral ground-glass opacities with relative peripheral sparing,  probably ground-glass edema but underlying pneumonia would be difficult to exclude. 3. Diffuse bronchial thickening which could be congestive and/or bronchitic. 4. Aortic atherosclerosis with aortic graft repair. 5. Slightly increased aneurysmal prominence in the proximal aortic arch but only by 1 mm, more significant increased prominence of the aortic isthmus now 4.9 mm, increased 7 mm. Recommend vascular surgery referral and MRA or CTA. 6. Chronic enlargement of the pulmonary trunk indicating arterial hypertension. 7. 1.5 cm heterogeneous solid nodule posteriorly in  the lower pole of the right thyroid lobe. Nonemergent follow-up ultrasound recommended. 8. Asymmetric left renal atrophy and cysts. 9. These results will be called to the ordering clinician or representative by the Radiologist Assistant, and communication documented in the PACS or [employer]. Aortic Atherosclerosis (ICD10-I70.0). Electronically Signed   By: Denman Fischer M.D.   On: 03/31/2024 05:12   DG Chest Port 1 View Result Date: 03/31/2024 CLINICAL DATA:  68 year old female with shortness of breath. EXAM: PORTABLE CHEST 1 VIEW COMPARISON:  Chest CT 12/09/2023 and earlier. FINDINGS: Portable AP semi upright view at 0403 hours. Cardiomegaly and chronically enlarged thoracic aorta contours are stable. Stable lung volumes. No pneumothorax, pleural effusion or consolidation. Diffuse increased pulmonary interstitium when compared to previous radiographs. Visualized tracheal air column is within normal limits. No acute osseous abnormality identified. Negative visible bowel gas. IMPRESSION: Chronic cardiomegaly with diffusely increased pulmonary interstitium favored to reflect acute pulmonary edema. Viral/atypical respiratory infection felt less likely. No pleural effusion or consolidation. Electronically Signed   By: Marlise Simpers M.D.   On: 03/31/2024 04:19    DISCHARGE EXAMINATION: Vitals:   04/02/24 2354 04/03/24 0331 04/03/24 0339 04/03/24 0806  BP: 124/79  129/82 (!) 161/104  Pulse: 76  69 82  Resp: 20 (!) 8 20 15   Temp: 98.2 F (36.8 C)  97.9 F (36.6 C)   TempSrc: Oral  Oral   SpO2: 100%  99% 98%  Weight:  60.9 kg    Height:       General appearance: Awake alert.  In no distress Resp: Clear to auscultation bilaterally.  Normal effort Cardio: S1-S2 is normal regular.  No S3-S4.  No rubs murmurs or bruit GI: Abdomen is soft.  Nontender nondistended.  Bowel sounds are present normal.  No masses organomegaly  DISPOSITION: Home  Discharge Instructions     Call MD for:  difficulty  breathing, headache or visual disturbances   Complete by: As directed    Call MD for:  extreme fatigue   Complete by: As directed    Call MD for:  persistant dizziness or light-headedness   Complete by: As directed    Call MD for:  persistant nausea and vomiting   Complete by: As directed    Call MD for:  severe uncontrolled pain   Complete by: As directed    Call MD for:  temperature >100.4   Complete by: As directed    Diet - low sodium heart healthy   Complete by: As directed    Discharge instructions   Complete by: As directed    Please take your medications as prescribed.  You should hear from the kidney doctors office regarding outpatient appointment.  If you do not hear from them in the next week or so please contact your PCP to get a referral to the kidney specialist. Please have your primary care provider do lipid panel blood work at follow-up. Message has also been sent to Dr. Raina Bunting office to schedule appointment. Cardiology will also contact you  for an appointment.  Thanks  You were cared for by a hospitalist during your hospital stay. If you have any questions about your discharge medications or the care you received while you were in the hospital after you are discharged, you can call the unit and asked to speak with the hospitalist on call if the hospitalist that took care of you is not available. Once you are discharged, your primary care physician will handle any further medical issues. Please note that NO REFILLS for any discharge medications will be authorized once you are discharged, as it is imperative that you return to your primary care physician (or establish a relationship with a primary care physician if you do not have one) for your aftercare needs so that they can reassess your need for medications and monitor your lab values. If you do not have a primary care physician, you can call [phone] for a physician referral.   Increase activity slowly   Complete by: As  directed          Allergies as of 04/03/2024   No Known Allergies      Medication List     STOP taking these medications    metoprolol  tartrate 50 MG tablet Commonly known as: LOPRESSOR    spironolactone 25 MG tablet Commonly known as: ALDACTONE       TAKE these medications    acetaminophen  500 MG tablet Commonly known as: TYLENOL  Take 1,000 mg by mouth every 8 (eight) hours as needed for moderate pain.   amLODipine  10 MG tablet Commonly known as: NORVASC  Take 1 tablet (10 mg total) by mouth daily.   aspirin  EC 325 MG tablet Take 1 tablet (325 mg total) by mouth daily.   carvedilol  25 MG tablet Commonly known as: COREG  Take 1 tablet (25 mg total) by mouth 2 (two) times daily with a meal.   Cholecalciferol 125 MCG (5000 UT) Tabs Take 1 tablet by mouth daily.   Fish Oil 1200 MG Caps Take 1 capsule by mouth daily.   gabapentin  300 MG capsule Commonly known as: NEURONTIN  Take 300 mg by mouth in the morning.   hydrALAZINE  100 MG tablet Commonly known as: APRESOLINE  Take 1 tablet (100 mg total) by mouth 3 (three) times daily.   methocarbamol  500 MG tablet Commonly known as: ROBAXIN  Take 1 tablet (500 mg total) by mouth every 8 (eight) hours as needed for muscle spasms.          Follow-up Information     [facility] Follow up.   Why: Office should call with appointment. Contact information: [address] [place] [address] [phone]         Hilarie Lovely, MD Follow up on 04/20/2024.   Specialty: Cardiothoracic Surgery Why: New location: [address], [place]. Appointment is on 04/20/2024 at 12:30PM Contact information: [address] [place] [address] [phone]                 TOTAL DISCHARGE TIME: 35 minutes  Zacharius Funari Lyndon Santiago  [facility] Pager on [email or web address]  04/04/2024, 10:16 AM

## 2024-04-03 NOTE — Plan of Care (Signed)

## 2024-04-15 ENCOUNTER — Other Ambulatory Visit: Payer: Self-pay

## 2024-04-15 ENCOUNTER — Inpatient Hospital Stay (HOSPITAL_COMMUNITY)
Admission: EM | Admit: 2024-04-15 | Discharge: 2024-04-18 | DRG: 314 | Disposition: A | Discharge status: Home / Self Care | Payer: Medicare (Managed Care) | Attending: Internal Medicine | Admitting: Internal Medicine

## 2024-04-15 ENCOUNTER — Emergency Department (HOSPITAL_COMMUNITY): Payer: Medicare (Managed Care)

## 2024-04-15 ENCOUNTER — Encounter (HOSPITAL_COMMUNITY): Payer: Self-pay

## 2024-04-15 DIAGNOSIS — I083 Combined rheumatic disorders of mitral, aortic and tricuspid valves: Secondary | ICD-10-CM | POA: Diagnosis present

## 2024-04-15 DIAGNOSIS — Z5309 Procedure and treatment not carried out because of other contraindication: Secondary | ICD-10-CM | POA: Diagnosis present

## 2024-04-15 DIAGNOSIS — I7121 Aneurysm of the ascending aorta, without rupture: Secondary | ICD-10-CM | POA: Diagnosis present

## 2024-04-15 DIAGNOSIS — J9601 Acute respiratory failure with hypoxia: Secondary | ICD-10-CM | POA: Diagnosis present

## 2024-04-15 DIAGNOSIS — I340 Nonrheumatic mitral (valve) insufficiency: Secondary | ICD-10-CM | POA: Diagnosis not present

## 2024-04-15 DIAGNOSIS — I3139 Other pericardial effusion (noninflammatory): Principal | ICD-10-CM | POA: Diagnosis present

## 2024-04-15 DIAGNOSIS — I714 Abdominal aortic aneurysm, without rupture, unspecified: Secondary | ICD-10-CM | POA: Diagnosis present

## 2024-04-15 DIAGNOSIS — I10 Essential (primary) hypertension: Secondary | ICD-10-CM | POA: Diagnosis not present

## 2024-04-15 DIAGNOSIS — D631 Anemia in chronic kidney disease: Secondary | ICD-10-CM | POA: Diagnosis present

## 2024-04-15 DIAGNOSIS — E119 Type 2 diabetes mellitus without complications: Secondary | ICD-10-CM | POA: Diagnosis not present

## 2024-04-15 DIAGNOSIS — R0789 Other chest pain: Secondary | ICD-10-CM | POA: Diagnosis not present

## 2024-04-15 DIAGNOSIS — F17200 Nicotine dependence, unspecified, uncomplicated: Secondary | ICD-10-CM | POA: Diagnosis present

## 2024-04-15 DIAGNOSIS — R7989 Other specified abnormal findings of blood chemistry: Secondary | ICD-10-CM | POA: Diagnosis present

## 2024-04-15 DIAGNOSIS — Z716 Tobacco abuse counseling: Secondary | ICD-10-CM

## 2024-04-15 DIAGNOSIS — Z95828 Presence of other vascular implants and grafts: Secondary | ICD-10-CM | POA: Diagnosis not present

## 2024-04-15 DIAGNOSIS — Z7982 Long term (current) use of aspirin: Secondary | ICD-10-CM | POA: Diagnosis not present

## 2024-04-15 DIAGNOSIS — E785 Hyperlipidemia, unspecified: Secondary | ICD-10-CM | POA: Diagnosis present

## 2024-04-15 DIAGNOSIS — R079 Chest pain, unspecified: Secondary | ICD-10-CM | POA: Diagnosis present

## 2024-04-15 DIAGNOSIS — Z79899 Other long term (current) drug therapy: Secondary | ICD-10-CM | POA: Diagnosis not present

## 2024-04-15 DIAGNOSIS — Z7984 Long term (current) use of oral hypoglycemic drugs: Secondary | ICD-10-CM | POA: Diagnosis not present

## 2024-04-15 DIAGNOSIS — N184 Chronic kidney disease, stage 4 (severe): Secondary | ICD-10-CM | POA: Diagnosis present

## 2024-04-15 DIAGNOSIS — F17210 Nicotine dependence, cigarettes, uncomplicated: Secondary | ICD-10-CM | POA: Diagnosis present

## 2024-04-15 DIAGNOSIS — R0602 Shortness of breath: Secondary | ICD-10-CM

## 2024-04-15 DIAGNOSIS — I300 Acute nonspecific idiopathic pericarditis: Secondary | ICD-10-CM | POA: Diagnosis not present

## 2024-04-15 DIAGNOSIS — Z538 Procedure and treatment not carried out for other reasons: Secondary | ICD-10-CM | POA: Diagnosis not present

## 2024-04-15 DIAGNOSIS — I319 Disease of pericardium, unspecified: Principal | ICD-10-CM | POA: Diagnosis present

## 2024-04-15 DIAGNOSIS — E1122 Type 2 diabetes mellitus with diabetic chronic kidney disease: Secondary | ICD-10-CM | POA: Diagnosis present

## 2024-04-15 DIAGNOSIS — B182 Chronic viral hepatitis C: Secondary | ICD-10-CM | POA: Diagnosis present

## 2024-04-15 DIAGNOSIS — I129 Hypertensive chronic kidney disease with stage 1 through stage 4 chronic kidney disease, or unspecified chronic kidney disease: Secondary | ICD-10-CM | POA: Diagnosis present

## 2024-04-15 DIAGNOSIS — J9691 Respiratory failure, unspecified with hypoxia: Secondary | ICD-10-CM | POA: Diagnosis present

## 2024-04-15 DIAGNOSIS — N280 Ischemia and infarction of kidney: Secondary | ICD-10-CM | POA: Diagnosis present

## 2024-04-15 DIAGNOSIS — N189 Chronic kidney disease, unspecified: Secondary | ICD-10-CM | POA: Diagnosis present

## 2024-04-15 DIAGNOSIS — Z8619 Personal history of other infectious and parasitic diseases: Secondary | ICD-10-CM | POA: Diagnosis present

## 2024-04-15 LAB — CBC WITH DIFFERENTIAL/PLATELET
Abs Immature Granulocytes: 0.01 10*3/uL (ref 0.00–0.07)
Basophils Absolute: 0 10*3/uL (ref 0.0–0.1)
Basophils Relative: 1 %
Eosinophils Absolute: 0.2 10*3/uL (ref 0.0–0.5)
Eosinophils Relative: 4 %
HCT: 34 % — ABNORMAL LOW (ref 36.0–46.0)
Hemoglobin: 10.8 g/dL — ABNORMAL LOW (ref 12.0–15.0)
Immature Granulocytes: 0 %
Lymphocytes Relative: 22 %
Lymphs Abs: 1.5 10*3/uL (ref 0.7–4.0)
MCH: 32.6 pg (ref 26.0–34.0)
MCHC: 31.8 g/dL (ref 30.0–36.0)
MCV: 102.7 fL — ABNORMAL HIGH (ref 80.0–100.0)
Monocytes Absolute: 0.6 10*3/uL (ref 0.1–1.0)
Monocytes Relative: 9 %
Neutro Abs: 4.3 10*3/uL (ref 1.7–7.7)
Neutrophils Relative %: 64 %
Platelets: 227 10*3/uL (ref 150–400)
RBC: 3.31 MIL/uL — ABNORMAL LOW (ref 3.87–5.11)
RDW: 13.2 % (ref 11.5–15.5)
WBC: 6.7 10*3/uL (ref 4.0–10.5)
nRBC: 0 % (ref 0.0–0.2)

## 2024-04-15 LAB — TROPONIN I (HIGH SENSITIVITY)
Troponin I (High Sensitivity): 34 ng/L — ABNORMAL HIGH (ref <18)
Troponin I (High Sensitivity): 31 ng/L — ABNORMAL HIGH (ref <18)

## 2024-04-15 LAB — BRAIN NATRIURETIC PEPTIDE: B Natriuretic Peptide: 3207.9 pg/mL — ABNORMAL HIGH (ref 0.0–100.0)

## 2024-04-15 LAB — BASIC METABOLIC PANEL WITH GFR
Anion gap: 10 (ref 5–15)
BUN: 51 mg/dL — ABNORMAL HIGH (ref 8–23)
CO2: 19 mmol/L — ABNORMAL LOW (ref 22–32)
Calcium: 9.1 mg/dL (ref 8.9–10.3)
Chloride: 113 mmol/L — ABNORMAL HIGH (ref 98–111)
Creatinine, Ser: 3.74 mg/dL — ABNORMAL HIGH (ref 0.44–1.00)
GFR, Estimated: 12 mL/min — ABNORMAL LOW (ref >60)
Glucose, Bld: 107 mg/dL — ABNORMAL HIGH (ref 70–99)
Potassium: 4.5 mmol/L (ref 3.5–5.1)
Sodium: 142 mmol/L (ref 135–145)

## 2024-04-15 LAB — ECHOCARDIOGRAM LIMITED
Calc EF: 55.3 %
Height: 68 in
MV M vel: 5.34 m/s
MV Peak grad: 114.1 mmHg
P 1/2 time: 384 ms
Radius: 0.6 cm
S' Lateral: 3.7 cm
Single Plane A2C EF: 66.6 %
Single Plane A4C EF: 44.4 %
Weight: 2116.42 [oz_av]

## 2024-04-15 LAB — GLUCOSE, CAPILLARY
Glucose-Capillary: 147 mg/dL — ABNORMAL HIGH (ref 70–99)
Glucose-Capillary: 141 mg/dL — ABNORMAL HIGH (ref 70–99)
Glucose-Capillary: 145 mg/dL — ABNORMAL HIGH (ref 70–99)

## 2024-04-15 LAB — SEDIMENTATION RATE: Sed Rate: 19 mm/h (ref 0–22)

## 2024-04-15 LAB — HEMOGLOBIN A1C
Hgb A1c MFr Bld: 4.5 % — ABNORMAL LOW (ref 4.8–5.6)
Mean Plasma Glucose: 82.45 mg/dL

## 2024-04-15 LAB — FOLATE: Folate: 23.7 ng/mL (ref >5.9)

## 2024-04-15 LAB — C-REACTIVE PROTEIN: CRP: 0.7 mg/dL (ref <1.0)

## 2024-04-15 LAB — D-DIMER, QUANTITATIVE: D-Dimer, Quant: 1.17 ug{FEU}/mL — ABNORMAL HIGH (ref 0.00–0.50)

## 2024-04-15 LAB — VITAMIN B12: Vitamin B-12: 554 pg/mL (ref 180–914)

## 2024-04-15 MED ORDER — HYDRALAZINE HCL 50 MG PO TABS
100.0000 mg | ORAL_TABLET | Freq: Three times a day (TID) | ORAL | Status: DC
  Administered 2024-04-15 – 2024-04-18 (×7): 100 mg via ORAL
  Filled 2024-04-15 (×7): qty 2

## 2024-04-15 MED ORDER — HEPARIN BOLUS VIA INFUSION
2000.0000 [IU] | Freq: Once | INTRAVENOUS | Status: DC
  Filled 2024-04-15: qty 2000

## 2024-04-15 MED ORDER — METOPROLOL TARTRATE 5 MG/5ML IV SOLN: 5.0000 mg | Freq: Four times a day (QID) | INTRAVENOUS | Status: DC | PRN

## 2024-04-15 MED ORDER — ATORVASTATIN CALCIUM 10 MG PO TABS
10.0000 mg | ORAL_TABLET | Freq: Every day | ORAL | Status: DC
  Administered 2024-04-15 – 2024-04-17 (×3): 10 mg via ORAL
  Filled 2024-04-15 (×3): qty 1

## 2024-04-15 MED ORDER — AMLODIPINE BESYLATE 10 MG PO TABS
5.0000 mg | ORAL_TABLET | Freq: Every day | ORAL | Status: DC
Start: 2024-04-15 — End: 2024-04-16
  Administered 2024-04-15: 5 mg via ORAL
  Filled 2024-04-15: qty 1

## 2024-04-15 MED ORDER — POLYETHYLENE GLYCOL 3350 17 G PO PACK
17.0000 g | PACK | Freq: Every day | ORAL | Status: DC | PRN
  Administered 2024-04-17: 17 g via ORAL
  Filled 2024-04-15: qty 1

## 2024-04-15 MED ORDER — AMLODIPINE BESYLATE 5 MG PO TABS: 10.0000 mg | ORAL_TABLET | Freq: Every day | ORAL | Status: DC

## 2024-04-15 MED ORDER — METHYLPREDNISOLONE SODIUM SUCC 125 MG IJ SOLR
125.0000 mg | Freq: Once | INTRAMUSCULAR | Status: AC
  Administered 2024-04-15: 125 mg via INTRAVENOUS
  Filled 2024-04-15: qty 2

## 2024-04-15 MED ORDER — MORPHINE SULFATE (PF) 4 MG/ML IV SOLN
4.0000 mg | INTRAVENOUS | Status: DC | PRN
  Administered 2024-04-15 (×2): 4 mg via INTRAVENOUS
  Filled 2024-04-15 (×2): qty 1

## 2024-04-15 MED ORDER — PREDNISONE 20 MG PO TABS
30.0000 mg | ORAL_TABLET | Freq: Every day | ORAL | Status: DC
  Administered 2024-04-16 – 2024-04-17 (×2): 30 mg via ORAL
  Filled 2024-04-15: qty 1

## 2024-04-15 MED ORDER — ASPIRIN 325 MG PO TBEC
325.0000 mg | DELAYED_RELEASE_TABLET | Freq: Every day | ORAL | Status: DC
  Administered 2024-04-16 – 2024-04-18 (×3): 325 mg via ORAL
  Filled 2024-04-15 (×3): qty 1

## 2024-04-15 MED ORDER — HEPARIN (PORCINE) 25000 UT/250ML-% IV SOLN
1000.0000 [IU]/h | INTRAVENOUS | Status: DC
  Filled 2024-04-15: qty 250

## 2024-04-15 MED ORDER — MORPHINE SULFATE (PF) 4 MG/ML IV SOLN
4.0000 mg | Freq: Once | INTRAVENOUS | Status: AC
  Administered 2024-04-15: 4 mg via INTRAVENOUS
  Filled 2024-04-15: qty 1

## 2024-04-15 MED ORDER — IPRATROPIUM-ALBUTEROL 0.5-2.5 (3) MG/3ML IN SOLN
3.0000 mL | RESPIRATORY_TRACT | Status: DC | PRN
  Administered 2024-04-17: 3 mL via RESPIRATORY_TRACT
  Filled 2024-04-15: qty 3

## 2024-04-15 MED ORDER — LORAZEPAM 2 MG/ML IJ SOLN
0.5000 mg | Freq: Once | INTRAMUSCULAR | Status: AC
  Administered 2024-04-15: 0.5 mg via INTRAVENOUS
  Filled 2024-04-15: qty 1

## 2024-04-15 MED ORDER — VITAMIN D3 25 MCG (1000 UNIT) PO TABS
5000.0000 [IU] | ORAL_TABLET | Freq: Every day | ORAL | Status: DC
  Administered 2024-04-16 – 2024-04-18 (×3): 5000 [IU] via ORAL
  Filled 2024-04-15 (×3): qty 5

## 2024-04-15 MED ORDER — FENTANYL CITRATE PF 50 MCG/ML IJ SOSY: 12.5000 ug | PREFILLED_SYRINGE | INTRAMUSCULAR | Status: DC | PRN

## 2024-04-15 MED ORDER — NICOTINE 7 MG/24HR TD PT24: 7.0000 mg | MEDICATED_PATCH | Freq: Every day | TRANSDERMAL | Status: DC | PRN

## 2024-04-15 MED ORDER — CARVEDILOL 25 MG PO TABS
25.0000 mg | ORAL_TABLET | Freq: Two times a day (BID) | ORAL | Status: DC
  Administered 2024-04-15 – 2024-04-18 (×5): 25 mg via ORAL
  Filled 2024-04-15 (×2): qty 1
  Filled 2024-04-15: qty 2
  Filled 2024-04-15: qty 1
  Filled 2024-04-15: qty 2

## 2024-04-15 MED ORDER — INSULIN ASPART 100 UNIT/ML IJ SOLN
0.0000 [IU] | INTRAMUSCULAR | Status: DC
  Administered 2024-04-15: 1 [IU] via SUBCUTANEOUS
  Administered 2024-04-15: 2 [IU] via SUBCUTANEOUS
  Administered 2024-04-16 (×2): 1 [IU] via SUBCUTANEOUS
  Administered 2024-04-16: 2 [IU] via SUBCUTANEOUS
  Administered 2024-04-16 – 2024-04-18 (×5): 1 [IU] via SUBCUTANEOUS
  Filled 2024-04-15: qty 0.09

## 2024-04-15 MED ORDER — ACETAMINOPHEN 500 MG PO TABS
1000.0000 mg | ORAL_TABLET | Freq: Three times a day (TID) | ORAL | Status: DC | PRN
  Administered 2024-04-15 – 2024-04-16 (×2): 1000 mg via ORAL
  Filled 2024-04-15 (×2): qty 2

## 2024-04-15 NOTE — Assessment & Plan Note (Signed)
 On diet usually Given steroid dosing will follow CBGs, diabetes diet, sliding scale insulin 

## 2024-04-15 NOTE — ED Provider Notes (Signed)
 [place] [facility]GENCY DEPARTMENT AT [facility] Provider Note   CSN: [id number] Arrival date & time: 04/15/24  1059     History  Chief Complaint  Patient presents with   Chest Pain   Shortness of Breath    Mary Stephens is a 68 y.o. female.  68 year old female presents with chest pain shortness of breath which began last night.  Denies any URI symptoms.  States that symptoms are better when she sits up and worse when she lies flat.  Denies any leg pain or swelling.  Patient discharged from hospital about 2 weeks ago after admission for hypertensive urgency.  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  Also carries a history of hypertension and this will be to for counter medications as well as diet control diabetes.  Denies any syncope or near syncope.  No treatment use for this prior to arrival       Home Medications Prior to Admission medications   Medication Sig Start Date End Date Taking? Authorizing Provider  acetaminophen  (TYLENOL ) 500 MG tablet Take 1,000 mg by mouth every 8 (eight) hours as needed for moderate pain.    [provider]  amLODipine  (NORVASC ) 10 MG tablet Take 1 tablet (10 mg total) by mouth daily. 04/03/24   Maylene Spear, MD  aspirin  EC 325 MG EC tablet Take 1 tablet (325 mg total) by mouth daily. 12/10/20   Barrett, Erin R, PA-C  carvedilol  (COREG ) 25 MG tablet Take 1 tablet (25 mg total) by mouth 2 (two) times daily with a meal. 04/03/24   Maylene Spear, MD  Cholecalciferol 125 MCG (5000 UT) TABS Take 1 tablet by mouth daily.    [provider]  gabapentin  (NEURONTIN ) 300 MG capsule Take 300 mg by mouth in the morning.    [provider]  hydrALAZINE  (APRESOLINE ) 100 MG tablet Take 1 tablet (100 mg total) by mouth 3 (three) times daily. 04/03/24   Krishnan, Gokul, MD  methocarbamol  (ROBAXIN ) 500 MG tablet Take 1 tablet (500 mg total) by mouth every 8 (eight) hours as needed for muscle  spasms. 04/03/24   Krishnan, Gokul, MD  Omega-3 Fatty Acids (FISH OIL) 1200 MG CAPS Take 1 capsule by mouth daily.    [provider]  glipiZIDE  (GLUCOTROL  XL) 5 MG 24 hr tablet Take 1 tablet (5 mg total) by mouth daily with breakfast. Patient not taking: Reported on 06/20/2020 01/27/18 06/20/20  Fleming, Zelda W, NP      Allergies    Patient has no known allergies.    Review of Systems   Review of Systems  All other systems reviewed and are negative.   Physical Exam Updated Vital Signs BP (!) 147/98 (BP Location: Left Arm)   Pulse 62   Temp 98.5 F (36.9 C) (Oral)   Resp 18   Ht 1.727 m (5\' 8" )   Wt 60 kg   SpO2 96%   BMI 20.11 kg/m  Physical Exam Vitals and nursing note reviewed.  Constitutional:      General: She is not in acute distress.    Appearance: Normal appearance. She is well-developed. She is not toxic-appearing.  HENT:     Head: Normocephalic and atraumatic.  Eyes:     General: Lids are normal.     Conjunctiva/sclera: Conjunctivae normal.     Pupils: Pupils are equal, round, and reactive to light.  Neck:     Thyroid: No thyroid mass.  Trachea: No tracheal deviation.  Cardiovascular:     Rate and Rhythm: Normal rate and regular rhythm.     Heart sounds: Murmur heard.     No gallop.  Pulmonary:     Effort: Pulmonary effort is normal. No respiratory distress.     Breath sounds: Normal breath sounds. No stridor. No decreased breath sounds, wheezing, rhonchi or rales.  Abdominal:     General: There is no distension.     Palpations: Abdomen is soft.     Tenderness: There is no abdominal tenderness. There is no rebound.  Musculoskeletal:        General: No tenderness. Normal range of motion.     Cervical back: Normal range of motion and neck supple.  Skin:    General: Skin is warm and dry.     Findings: No abrasion or rash.  Neurological:     Mental Status: She is alert and oriented to person, place, and time. Mental status is at baseline.      GCS: GCS eye subscore is 4. GCS verbal subscore is 5. GCS motor subscore is 6.     Cranial Nerves: No cranial nerve deficit.     Sensory: No sensory deficit.     Motor: Motor function is intact.  Psychiatric:        Attention and Perception: Attention normal.        Speech: Speech normal.        Behavior: Behavior normal.     ED Results / Procedures / Treatments   Labs (all labs ordered are listed, but only abnormal results are displayed) Labs Reviewed  CBC WITH DIFFERENTIAL/PLATELET  BASIC METABOLIC PANEL WITH GFR  TROPONIN I (HIGH SENSITIVITY)    EKG EKG Interpretation Date/Time:  Sunday Apr 15 2024 11:13:42 EDT Ventricular Rate:  61 PR Interval:  159 QRS Duration:  102 QT Interval:  462 QTC Calculation: 466 R Axis:   26  Text Interpretation: Sinus rhythm Probable left atrial enlargement Left ventricular hypertrophy Confirmed by Lind Repine ([id number]) on 04/15/2024 11:58:58 AM  Radiology No results found.  Procedures Procedures    Medications Ordered in ED Medications - No data to display  ED Course/ Medical Decision Making/ A&P                                 Medical Decision Making Amount and/or Complexity of Data Reviewed Labs: ordered. Radiology: ordered.  Risk Prescription drug management.   Patient is EKG shows normal sinus rhythm.  Patient's chest x-ray shows questionable alveolar opacities.  Concern for possible tamponade versus pericarditis.  Patient has chronic kidney disease and she had a CT scan of her chest without contrast which did not show a large pericardial effusion.  Patient was noted to have desatted when she was lying flat for her CT scan down to 86%.  Unclear if this is from splinting.  For that patient needs to have an echocardiogram as well as a workup for likely pericarditis.  Case discussed with Dr. Lavonne Prairie from cardiology who recommends patient have inflammatory markers as well as they will do an echocardiogram tomorrow.  They are  agreeable to patient having also steroids.  Will consult hospitalist team for admission        Final Clinical Impression(s) / ED Diagnoses Final diagnoses:  None    Rx / DC Orders ED Discharge Orders     None  Lind Repine, MD 04/15/24 1444

## 2024-04-15 NOTE — Assessment & Plan Note (Addendum)
 Status post type A repair Little bit larger dilation which appears to be stable per radiology. Question if this could be a subacute dissection.  The patient was stable from a blood pressure standpoint and does have a high D-dimer Per cardiology, could be a increased dissection with mitral valve involvement.  This is not consistent with a type a require emergent surgical repair and will be treated medically with good blood pressure control.

## 2024-04-15 NOTE — Consult Note (Signed)
 Cardiology Consultation:   Patient ID: Mary Stephens; [id number]; 28-Oct-1956   Admit date: 04/15/2024 Date of Consult: 04/15/2024  Primary Care Provider: System, Provider Not In Primary Cardiologist: None  Primary Electrophysiologist:  None   Patient Profile:   Mary Stephens is a 68 y.o. female with a hx of aortic dissection, poorly controlled HTN and CKD who is being seen today for the evaluation of chest pain at the request of Mary Stephens.  History of Present Illness:   Mary Stephens presents with chest discomfort.  She said this was similar to what she had last month.  She does not remember any of her symptoms back in 2021 being this.  As when she had aortic dissection repaired.  She has not seen cardiology that I can see before.  She follows with Mary Stephens who did her repair.  She said that last night she developed acute pain.  She describes it as sharp.  Under her left shoulder.  There is no radiation.  It comes and goes.  It can be 8 out of 10 in intensity.  Is not positional.  She has not had any cough fevers or chills.  She is not having any nausea vomiting or diaphoresis.  She is also having shortness of breath.  She has been sleeping over the last month or so with her head elevated.  She has not been having edema or increased weight.  A limited echo suggested that her mitral regurgitation was at least moderate.  I reviewed these side-by-side with the previous echo.  Previous echo suggested mild mitral regurgitation which might have been underestimated.  She has well-preserved ejection fraction with moderate concentric left ventricular hypertrophy.  There are some mild to moderate aortic insufficiency unchanged.  There is moderate tricuspid regurgitation.  There was suggestion of elevated right atrial pressure.  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  There is a persistent dissection flap in the distal arch.  This was without  contrast because of her renal insufficiency.  She had some trouble lying flat for this.  Her creatinine is actually slightly less than previous.  Troponin is flat and lower than previous at 34 and 31.  (It peaked at 487 2 weeks ago.) BNP is 3207.  She is anemic which is unchanged from previous.  She is slightly hypertensive but not severe compared to previous.  She is requiring 3 L of oxygen with sats as low as 88% on room air.  Heart rates unremarkable.  EKG does not demonstrate acute ST segment changes.  She was in the emergency room and admitted last month.  She had hypertensive urgency.  She had to be treated briefly with esmolol .  Echo demonstrated well-preserved ejection fraction.  She did have a CT scan that showed slightly increased aneurysmal prominence of the aortic arch but no acute findings.  She had acute on chronic renal insufficiency.  Her troponin was elevated but thought to be demand ischemia.  I reviewed her EKGs from that admission and she had nonspecific ST changes and left ventricular hypertrophy but no acute ST elevation.  She had an elevated BNP at that time as well  Past Medical History:  Diagnosis Date   Anxiety    Arthritis    Cataract    forming- very small   Colon polyps    Depression    Diabetes mellitus without complication (HCC)    off meds for diabetes- diet  controlled   GERD (gastroesophageal reflux disease)    H/O aortic dissection 2021   Hepatitis C    Hyperlipidemia    Hypertension    Neuropathy    feet    Past Surgical History:  Procedure Laterality Date   COLONOSCOPY  ?2001,2011?   in [place] (1st colon 20 polyps removed-per pt)   IR THORACENTESIS ASP PLEURAL SPACE W/IMG GUIDE  12/09/2020   PARTIAL HYSTERECTOMY  2001   REPAIR OF ACUTE ASCENDING THORACIC AORTIC DISSECTION N/A 12/02/2020   Procedure: REPAIR OF ACUTE ASCENDING THORACIC AORTIC DISSECTION VIA CIRC ARREST USING HEMASHIELD PLATINUM 28 MM VASCULAR GRAFT.;  Surgeon: Hilarie Lovely,  MD;  Location: [facility] OR;  Service: Vascular;  Laterality: N/A;  AXILLARY CANNULATION AND CIRC ARREST   REPAIR OF ACUTE ASCENDING THORACIC AORTIC DISSECTION VIA CIRC ARREST USING HEMASHIELD PLATINUM 28 MM VASCULAR GRAFT. (N/A)  12/02/2020   TEE WITHOUT CARDIOVERSION  12/02/2020   Procedure: TRANSESOPHAGEAL ECHOCARDIOGRAM (TEE);  Surgeon: Hilarie Lovely, MD;  Location: [facility] OR;  Service: Vascular;;     Home Medications:  Prior to Admission medications   Medication Sig Start Date End Date Taking? Authorizing Provider  acetaminophen  (TYLENOL ) 500 MG tablet Take 1,000 mg by mouth every 8 (eight) hours as needed for moderate pain.   Yes [provider]  aspirin  EC 325 MG EC tablet Take 1 tablet (325 mg total) by mouth daily. 12/10/20  Yes Barrett, Erin R, PA-C  carvedilol  (COREG ) 25 MG tablet Take 1 tablet (25 mg total) by mouth 2 (two) times daily with a meal. 04/03/24  Yes Maylene Spear, MD  hydrALAZINE  (APRESOLINE ) 100 MG tablet Take 1 tablet (100 mg total) by mouth 3 (three) times daily. 04/03/24  Yes Krishnan, Gokul, MD  methocarbamol  (ROBAXIN ) 500 MG tablet Take 1 tablet (500 mg total) by mouth every 8 (eight) hours as needed for muscle spasms. 04/03/24  Yes Krishnan, Gokul, MD  amLODipine  (NORVASC ) 10 MG tablet Take 1 tablet (10 mg total) by mouth daily. Patient not taking: Reported on 04/15/2024 04/03/24   Krishnan, Gokul, MD  Cholecalciferol 125 MCG (5000 UT) TABS Take 1 tablet by mouth daily.    [provider]  gabapentin  (NEURONTIN ) 300 MG capsule Take 300 mg by mouth in the morning.    [provider]  glipiZIDE  (GLUCOTROL  XL) 5 MG 24 hr tablet Take 1 tablet (5 mg total) by mouth daily with breakfast. Patient not taking: Reported on 06/20/2020 01/27/18 06/20/20  Fleming, Zelda W, NP    Inpatient Medications: Scheduled Meds:  amLODipine   10 mg Oral Daily   [START ON 04/16/2024] aspirin  EC  325 mg Oral Daily   atorvastatin   10 mg Oral Daily   carvedilol   25 mg Oral  BID WC   Cholecalciferol  1 tablet Oral Daily   hydrALAZINE   100 mg Oral TID   insulin  aspart  0-9 Units Subcutaneous Q4H   [START ON 04/16/2024] predniSONE   30 mg Oral Q breakfast   Continuous Infusions:  PRN Meds: acetaminophen , fentaNYL  (SUBLIMAZE ) injection, ipratropium-albuterol, metoprolol  tartrate, nicotine , polyethylene glycol  Allergies:   No Known Allergies  Social History:   Social History   Socioeconomic History   Marital status: Single    Spouse name: Not on file   Number of children: 1   Years of education: Not on file   Highest education level: Not on file  Occupational History   Occupation: unemployed  Tobacco Use   Smoking status: Every Day    Current packs/day:  0.25    Types: Cigarettes   Smokeless tobacco: Never   Tobacco comments:    admits to cuttting back 4 cigarettes/day  Vaping Use   Vaping status: Never Used  Substance and Sexual Activity   Alcohol use: Yes    Alcohol/week: 1.0 standard drink of alcohol    Types: 1 Standard drinks or equivalent per week    Comment: wine cooler   Drug use: Not Currently    Types: Marijuana   Sexual activity: Not on file  Other Topics Concern   Not on file  Social History Narrative   Not on file   Social Drivers of Health   Financial Resource Strain: Not on file  Food Insecurity: Food Insecurity Present (03/31/2024)   Hunger Vital Sign    Worried About Running Out of Food in the Last Year: Sometimes true    Ran Out of Food in the Last Year: Sometimes true  Transportation Needs: No Transportation Needs (03/31/2024)   PRAPARE - Administrator, Civil Service (Medical): No    Lack of Transportation (Non-Medical): No  Physical Activity: Not on file  Stress: Not on file  Social Connections: Socially Isolated (03/31/2024)   Social Connection and Isolation Panel [NHANES]    Frequency of Communication with Friends and Family: Once a week    Frequency of Social Gatherings with Friends and Family: Once a  week    Attends Religious Services: Never    Database administrator or Organizations: No    Attends Banker Meetings: Never    Marital Status: Never married  Intimate Partner Violence: Not At Risk (03/31/2024)   Humiliation, Afraid, Rape, and Kick questionnaire    Fear of Current or Ex-Partner: No    Emotionally Abused: No    Physically Abused: No    Sexually Abused: No    Family History:    Family History  Problem Relation Age of Onset   Diabetes Mother    Colon cancer Neg Hx    Colon polyps Neg Hx    Esophageal cancer Neg Hx    Rectal cancer Neg Hx    Stomach cancer Neg Hx      ROS:  Please see the history of present illness.   All other ROS reviewed and negative.     Physical Exam/Data:   Vitals:   04/15/24 1515 04/15/24 1600 04/15/24 1630 04/15/24 1645  BP: (!) 160/100 (!) 143/100 (!) 150/92 (!) 154/75  Pulse: 66 63 (!) 58 (!) 58  Resp: 14 19 17 13   Temp:      TempSrc:      SpO2: 96% 94% 95% 98%  Weight:      Height:       No intake or output data in the 24 hours ending 04/15/24 1745 Filed Weights   04/15/24 1113  Weight: 60 kg   Body mass index is 20.11 kg/m.  GENERAL:  Mildly uncomfortable appearing HEENT:   Pupils equal round and reactive, fundi not visualized, oral mucosa unremarkable NECK:  No  jugular venous distention, waveform within normal limits, carotid upstroke brisk and symmetric, no bruits, no thyromegaly LYMPHATICS:  No cervical, inguinal adenopathy LUNGS:   Clear to auscultation bilaterally BACK:  No CVA tenderness CHEST:   Unremarkable HEART:  PMI not displaced or sustained,S1 and S2 within normal limits, no S3, no S4, no clicks, no rubs, 3 out of 6 holosystolic murmur heard at the apex and radiating to the axilla, no diastolic murmurs ABD:  Flat, positive bowel sounds normal in frequency in pitch, no bruits, no rebound, no guarding, no midline pulsatile mass, no hepatomegaly, no splenomegaly EXT:  2 plus pulses throughout, no   edema, no cyanosis no clubbing SKIN:  No rashes no nodules NEURO:   Cranial nerves II through XII grossly intact, motor grossly intact throughout PSYCH:    Cognitively intact, oriented to person place and time   EKG:  The EKG was personally reviewed and demonstrates: Sinus rhythm, rate 61, left ventricular hypertrophy by voltage criteria nonspecific ST-T wave changes Telemetry:  Telemetry was personally reviewed and demonstrates:  NA  Relevant CV Studies: Echo  See below  Chemistry Recent Labs  Lab 04/15/24 1159  NA 142  K 4.5  CL 113*  CO2 19*  GLUCOSE 107*  BUN 51*  CREATININE 3.74*  CALCIUM  9.1  GFRNONAA 12*  ANIONGAP 10    No results for input(s): "PROT", "ALBUMIN ", "AST", "ALT", "ALKPHOS", "BILITOT" in the last 168 hours. Hematology Recent Labs  Lab 04/15/24 1159  WBC 6.7  RBC 3.31*  HGB 10.8*  HCT 34.0*  MCV 102.7*  MCH 32.6  MCHC 31.8  RDW 13.2  PLT 227   Cardiac EnzymesNo results for input(s): "TROPONINI" in the last 168 hours. No results for input(s): "TROPIPOC" in the last 168 hours.  BNP Recent Labs  Lab 04/15/24 1159  BNP 3,207.9*    DDimer  Recent Labs  Lab 04/15/24 1550  DDIMER 1.17*    Radiology/Studies:  ECHOCARDIOGRAM LIMITED Result Date: 04/15/2024    ECHOCARDIOGRAM LIMITED REPORT   Patient Name:   Mary Stephens Date of Exam: 04/15/2024 Medical Rec #:  [id number]      Height:       68.0 in Accession #:    [id number]     Weight:       132.3 lb Date of Birth:  1956-02-18      BSA:          1.714 m Patient Age:    68 years       BP:           160/100 mmHg Patient Gender: F              HR:           64 bpm. Exam Location:  Inpatient Procedure: Limited Echo, Cardiac Doppler and Color Doppler (Both Spectral and            Color Flow Doppler were utilized during procedure). Indications:    R07.9* Chest pain, unspecified; R06.02 SOB  History:        Patient has prior history of Echocardiogram examinations, most                 recent 03/14/2024.  Aortic Valve Disease, Signs/Symptoms:Dyspnea                 and Shortness of Breath; Risk Factors:Current Smoker,                 Dyslipidemia, Hypertension and Diabetes. Ascending aorta                 replacement.  Sonographer:    Raynelle Callow RDCS Referring Phys: [id number] [facility]  Sonographer Comments: Technically difficult study due to poor echo windows. IMPRESSIONS  1. Left ventricular ejection fraction, by estimation, is 60 to 65%. The left ventricle has normal function. The left ventricle has no regional wall motion abnormalities. There is severe left ventricular hypertrophy.  2.  Right ventricular systolic function is normal. The right ventricular size is normal. There is moderately elevated pulmonary artery systolic pressure. The estimated right ventricular systolic pressure is 59.1 mmHg.  3. The MR vena contracta is 0.6 cm. . Moderate to severe mitral valve regurgitation. No evidence of mitral stenosis.  4. The tricuspid valve is abnormal. Tricuspid valve regurgitation is moderate.  5. The aortic valve is tricuspid. Aortic valve regurgitation is moderate.  6. The inferior vena cava is dilated in size with <50% respiratory variability, suggesting right atrial pressure of 15 mmHg. FINDINGS  Left Ventricle: Left ventricular ejection fraction, by estimation, is 60 to 65%. The left ventricle has normal function. The left ventricle has no regional wall motion abnormalities. There is severe left ventricular hypertrophy. Right Ventricle: The right ventricular size is normal. Right vetricular wall thickness was not well visualized. Right ventricular systolic function is normal. There is moderately elevated pulmonary artery systolic pressure. The tricuspid regurgitant velocity is 3.32 m/s, and with an assumed right atrial pressure of 15 mmHg, the estimated right ventricular systolic pressure is 59.1 mmHg. Mitral Valve: The MR vena contracta is 0.6 cm. Moderate to severe mitral valve regurgitation. No evidence  of mitral valve stenosis. Tricuspid Valve: The tricuspid valve is abnormal. Tricuspid valve regurgitation is moderate . No evidence of tricuspid stenosis. Aortic Valve: The aortic valve is tricuspid. Aortic valve regurgitation is moderate. Aortic regurgitation PHT measures 384 msec. Pulmonic Valve: The pulmonic valve was not well visualized. Pulmonic valve regurgitation is not visualized. No evidence of pulmonic stenosis. Aorta: The aortic root and ascending aorta are structurally normal, with no evidence of dilitation. Venous: The inferior vena cava is dilated in size with less than 50% respiratory variability, suggesting right atrial pressure of 15 mmHg. Additional Comments: Spectral Doppler performed. Color Doppler performed.  LEFT VENTRICLE PLAX 2D LVIDd:         5.50 cm LVIDs:         3.70 cm LV PW:         1.60 cm LV IVS:        1.60 cm LVOT diam:     2.30 cm LVOT Area:     4.15 cm  LV Volumes (MOD) LV vol d, MOD A2C: 81.4 ml LV vol d, MOD A4C: 109.2 ml LV vol s, MOD A2C: 27.2 ml LV vol s, MOD A4C: 60.7 ml LV SV MOD A2C:     54.2 ml LV SV MOD A4C:     109.2 ml LV SV MOD BP:      52.7 ml IVC IVC diam: 2.40 cm AORTIC VALVE AI PHT:      384 msec  AORTA Ao Root diam: 3.00 cm Ao Asc diam:  3.40 cm MR Peak grad:    114.1 mmHg   TRICUSPID VALVE MR Mean grad:    78.0 mmHg    TR Peak grad:   44.1 mmHg MR Vmax:         534.00 cm/s  TR Vmax:        332.00 cm/s MR Vmean:        413.0 cm/s MR PISA:         2.26 cm     SHUNTS MR PISA Eff ROA: 16 mm       Systemic Diam: 2.30 cm MR PISA Radius:  0.60 cm Armida Lander MD Electronically signed by Armida Lander MD Signature Date/Time: 04/15/2024/3:57:47 PM    Final    DG Chest Port 1 View Result Date: 04/15/2024 CLINICAL  DATA:  Chest pain, shortness of breath EXAM: PORTABLE CHEST - 1 VIEW COMPARISON:  03/31/2024 FINDINGS: Mild interstitial and alveolar opacities involving bases more than apices, right greater than left, increased from previous. No pneumothorax. Mild  cardiomegaly.  Ectatic thoracic aorta. No effusion. Sternotomy wires. IMPRESSION: Mild interstitial and alveolar opacities, right greater than left. Electronically Signed   By: Nicoletta Barrier M.D.   On: 04/15/2024 12:57   CT Chest Wo Contrast Result Date: 04/15/2024 CLINICAL DATA:  Chest pain, shortness of breath EXAM: CT CHEST WITHOUT CONTRAST TECHNIQUE: Multidetector CT imaging of the chest was performed following the standard protocol without IV contrast. RADIATION DOSE REDUCTION: This exam was performed according to the departmental dose-optimization program which includes automated exposure control, adjustment of the mA and/or kV according to patient size and/or use of iterative reconstruction technique. COMPARISON:  03/31/2024 FINDINGS: Cardiovascular: Mild cardiomegaly with left-sided enlargement. Small pericardial effusion stable. Post ascending aorta repair with displaced intimal calcifications in the distal arch implying persistent dissection flap, as was noted on all postop studies back to 07/24/2021. estimated 4.3 cm dilatation of the proximal descending thoracic aorta, grossly stable by my measurement. Mediastinum/Nodes: No acute mediastinal hematoma, mass, or adenopathy. Lungs/Pleura: Trace pleural fluid right greater than left as before. No pneumothorax. Subpleural scarring and septal lines peripherally at the lung bases, right greater than left. Lungs otherwise clear. Upper Abdomen: Progressive left renal atrophy and cystic change since 12/02/2020. Displaced intimal calcifications in the abdominal aorta. No acute findings. Musculoskeletal: Sternotomy wires. IMPRESSION: 1. No acute findings. 2. Post ascending aorta repair with persistent dissection flap in the distal arch. 3. 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 4. Progressive left renal atrophy and cystic change since 12/02/2020. 5.  Aortic Atherosclerosis (ICD10-I70.0). Electronically Signed   By: Nicoletta Barrier M.D.   On: 04/15/2024 12:56     Assessment and Plan:   Chest plain: The etiology is not clear.  The differential includes musculoskeletal, primarily pulmonary, pulmonary embolism, pleuritic, pericarditis, acute coronary syndrome.  I do not strongly suspect pericarditis.  There is a small pericardial effusion which has been stable.  Her enzymes are actually lower than previous and there are no acute ST segment changes on EKG send no evidence of acute ST segment myocardial infarction.  There is no clear evidence of acute type A aortic dissection on either transthoracic or noncontrasted CT.  Her D-dimer is elevated.  I discussed this with the primary provider and have suggested ventilation/perfusion study to rule out pulmonary embolism.  Neck step would probably be a TEE although which we could try to arrange for tomorrow.  Please keep n.p.o. after midnight.  Unfortunately our study choices are limited by her renal insufficiency.     Type A aortic dissection: The patient has a history of this with repair in 2021.  She has a dissection flap as noted on her noncontrasted CTs at does not appear to be different today than previous.  See above  Mitral regurgitation: This is moderate to severe and may have been underestimated recently.  I will be suggesting an elective TEE to further assess.    Hypertension: She needs good blood pressure control.  Her heart rate somewhat borderline.  I would add amlodipine  5 mg tonight.  If it remains elevated we could use nitroglycerin  paste.  She needs good pain control.  Elevated BNP: Chest x-ray does not show acute edema.  She is oxygenating okay on nasal cannula and I would like to avoid diuresis given her  renal function and possible but if she becomes more acutely short of breath she should be treated with IV Lasix .   For questions or updates, please contact [facility] Please consult [email or web address] for contact info under Cardiology/STEMI.   Signed, Eilleen Grates, MD  04/15/2024 5:45 PM

## 2024-04-15 NOTE — ED Triage Notes (Signed)
 Pt arrived reporting chest pain and shortness of breath. Symptoms started last night. Pt denies cough, fever, or any other cold symptoms. Denies any cardiac hx. States she has been feeling off.

## 2024-04-15 NOTE — Assessment & Plan Note (Signed)
 Monitor LFTs Outpatient follow-up

## 2024-04-15 NOTE — Progress Notes (Signed)
 PHARMACY - ANTICOAGULATION CONSULT NOTE  Pharmacy Consult for IV heparin  Indication:  rule out PE  No Known Allergies  Patient Measurements: Height: 5\' 8"  (172.7 cm) Weight: 60 kg (132 lb 4.4 oz) IBW/kg (Calculated) : 63.9 HEPARIN  DW (KG): 60  Vital Signs: Temp: 97.6 F (36.4 C) (05/04 1749) Temp Source: Oral (05/04 1749) BP: 138/90 (05/04 1745) Pulse Rate: 59 (05/04 1745)  Labs: Recent Labs    04/15/24 1159 04/15/24 1423  HGB 10.8*  --   HCT 34.0*  --   PLT 227  --   CREATININE 3.74*  --   TROPONINIHS 34* 31*    Estimated Creatinine Clearance: 13.3 mL/min (A) (by C-G formula based on SCr of 3.74 mg/dL (H)).   Medical History: Past Medical History:  Diagnosis Date   Anxiety    Arthritis    Cataract    forming- very small   Colon polyps    Depression    Diabetes mellitus without complication (HCC)    off meds for diabetes- diet controlled   GERD (gastroesophageal reflux disease)    H/O aortic dissection 2021   Hepatitis C    Hyperlipidemia    Hypertension    Neuropathy    feet    Medications:  Scheduled:   amLODipine   10 mg Oral Daily   [START ON 04/16/2024] aspirin  EC  325 mg Oral Daily   atorvastatin   10 mg Oral Daily   carvedilol   25 mg Oral BID WC   Cholecalciferol  1 tablet Oral Daily   hydrALAZINE   100 mg Oral TID   insulin  aspart  0-9 Units Subcutaneous Q4H   [START ON 04/16/2024] predniSONE   30 mg Oral Q breakfast   Infusions:   Assessment: Patient admitted with CP and SOB found to have possible pericarditis. To also rule out PE with VQ scan since cannot use IV contrast due to PMH of CKD4. To start IV heparin  until PE can be ruled out or in. Baseline labs done. Patient not on anticoagulation prior to admission  Goal of Therapy:  Heparin  level 0.3-0.7 units/ml Monitor platelets by anticoagulation protocol: Yes   Plan:  IV heparin  2000 units bolus then IV heparin  rate of 1000 units/hr Check heparin  level in 8 hours Daily CBC and heparin   level Discontinue IV heparin  if VQ rules out PE    Bernett Brill 04/15/2024,5:53 PM

## 2024-04-15 NOTE — ED Notes (Signed)
 Echo in progress at bedside. Pt O2 saturation noted to be 83% on 3L nasal cannula; called respiratory to request eval. Pt is awake and alert with normal mentation at this time with NAD noted.

## 2024-04-15 NOTE — ED Notes (Signed)
 Patient SpO2 went down to 86% when she tried to lay down for her CT scan so patient was put on 2L of O2 while scan was performed.

## 2024-04-15 NOTE — H&P (Addendum)
 History and Physical    Patient: Mary Stephens:[id number] DOB: 02/09/1956 DOA: 04/15/2024 DOS: the patient was seen and examined on 04/15/2024 PCP: System, Provider Not In  Patient coming from: Home  Chief Complaint:  Chief Complaint  Patient presents with   Chest Pain   Shortness of Breath   HPI: Mary Stephens is a 68 y.o. female with medical history significant of  T2DM, HTN, HLD, ongoing tobacco use, hep C, CKD stage IV, and history of aortic dissection with repair presents with new onset chest pain.  Patient reports that the chest pain began last night and seem to taper off by this morning.  She also notes the pain is worse with lying flat.  Patient was recently admitted and discharged with hypertensive urgency about 2 weeks ago.  In the ED her EKG was noted to show no ischemic changes.  Troponins were essentially negative.  CT showed a small pericardial effusion as well as a 4.3 cm dilation of the proximal descending thoracic aorta which was thought to be grossly stable.  Patient reports never having chest pain like this before.  Patient also primarily complains of shortness of breath.  Patient was noted to D cells with sats in the 80s on room air when lying flat.  She was started on oxygen by nasal cannula.  Cardiology was consulted by phone who recommended steroids due to her kidney disease and an echo. Review of Systems: As mentioned in the history of present illness. All other systems reviewed and are negative. Past Medical History:  Diagnosis Date   Anxiety    Arthritis    Cataract    forming- very small   Colon polyps    Depression    Diabetes mellitus without complication (HCC)    off meds for diabetes- diet controlled   GERD (gastroesophageal reflux disease)    H/O aortic dissection 2021   Hepatitis C    Hyperlipidemia    Hypertension    Neuropathy    feet   Past Surgical History:  Procedure Laterality Date   COLONOSCOPY  ?2001,2011?   in [place] (1st colon 20  polyps removed-per pt)   IR THORACENTESIS ASP PLEURAL SPACE W/IMG GUIDE  12/09/2020   PARTIAL HYSTERECTOMY  2001   REPAIR OF ACUTE ASCENDING THORACIC AORTIC DISSECTION N/A 12/02/2020   Procedure: REPAIR OF ACUTE ASCENDING THORACIC AORTIC DISSECTION VIA CIRC ARREST USING HEMASHIELD PLATINUM 28 MM VASCULAR GRAFT.;  Surgeon: Hilarie Lovely, MD;  Location: [facility] OR;  Service: Vascular;  Laterality: N/A;  AXILLARY CANNULATION AND CIRC ARREST   REPAIR OF ACUTE ASCENDING THORACIC AORTIC DISSECTION VIA CIRC ARREST USING HEMASHIELD PLATINUM 28 MM VASCULAR GRAFT. (N/A)  12/02/2020   TEE WITHOUT CARDIOVERSION  12/02/2020   Procedure: TRANSESOPHAGEAL ECHOCARDIOGRAM (TEE);  Surgeon: Hilarie Lovely, MD;  Location: [facility] OR;  Service: Vascular;;   Social History:  reports that she has been smoking cigarettes. She has never used smokeless tobacco. She reports current alcohol use of about 1.0 standard drink of alcohol per week. She reports that she does not currently use drugs after having used the following drugs: Marijuana.  No Known Allergies  Family History  Problem Relation Age of Onset   Diabetes Mother    Colon cancer Neg Hx    Colon polyps Neg Hx    Esophageal cancer Neg Hx    Rectal cancer Neg Hx    Stomach cancer Neg Hx     Prior to Admission medications   Medication Sig Start  Date End Date Taking? Authorizing Provider  acetaminophen  (TYLENOL ) 500 MG tablet Take 1,000 mg by mouth every 8 (eight) hours as needed for moderate pain.   Yes [provider]  aspirin  EC 325 MG EC tablet Take 1 tablet (325 mg total) by mouth daily. 12/10/20  Yes Barrett, Erin R, PA-C  carvedilol  (COREG ) 25 MG tablet Take 1 tablet (25 mg total) by mouth 2 (two) times daily with a meal. 04/03/24  Yes Maylene Spear, MD  hydrALAZINE  (APRESOLINE ) 100 MG tablet Take 1 tablet (100 mg total) by mouth 3 (three) times daily. 04/03/24  Yes Krishnan, Gokul, MD  methocarbamol  (ROBAXIN ) 500 MG tablet Take 1 tablet  (500 mg total) by mouth every 8 (eight) hours as needed for muscle spasms. 04/03/24  Yes Krishnan, Gokul, MD  amLODipine  (NORVASC ) 10 MG tablet Take 1 tablet (10 mg total) by mouth daily. Patient not taking: Reported on 04/15/2024 04/03/24   Krishnan, Gokul, MD  Cholecalciferol 125 MCG (5000 UT) TABS Take 1 tablet by mouth daily.    [provider]  gabapentin  (NEURONTIN ) 300 MG capsule Take 300 mg by mouth in the morning.    [provider]  glipiZIDE  (GLUCOTROL  XL) 5 MG 24 hr tablet Take 1 tablet (5 mg total) by mouth daily with breakfast. Patient not taking: Reported on 06/20/2020 01/27/18 06/20/20  Collins Dean, NP    Physical Exam: Vitals:   04/15/24 1113 04/15/24 1215 04/15/24 1440 04/15/24 1515  BP:  132/83 (!) 150/93 (!) 160/100  Pulse:  60 66 66  Resp:   13 14  Temp:   98.3 F (36.8 C)   TempSrc:   Oral   SpO2:  93% 91% 96%  Weight: 60 kg     Height: 5\' 8"  (1.727 m)      Physical Examination: General appearance - in mild to moderate distress and chronically ill appearing Neck - supple, no significant adenopathy Chest - clear to auscultation, no wheezes, rales or rhonchi, symmetric air entry Heart - normal rate and regular rhythm, murmur is present, normal pedal and radial pulses. Abdomen - soft, nontender, nondistended, no masses or organomegaly Musculoskeletal - no joint tenderness, deformity or swelling Skin - normal coloration and turgor, no rashes, no suspicious skin lesions noted  Data Reviewed: Results for orders placed or performed during the hospital encounter of 04/15/24 (from the past 24 hours)  CBC with Differential/Platelet     Status: Abnormal   Collection Time: 04/15/24 11:59 AM  Result Value Ref Range   WBC 6.7 4.0 - 10.5 K/uL   RBC 3.31 (L) 3.87 - 5.11 MIL/uL   Hemoglobin 10.8 (L) 12.0 - 15.0 g/dL   HCT 32.3 (L) 55.7 - 32.2 %   MCV 102.7 (H) 80.0 - 100.0 fL   MCH 32.6 26.0 - 34.0 pg   MCHC 31.8 30.0 - 36.0 g/dL   RDW 02.5 42.7 - 06.2 %    Platelets 227 150 - 400 K/uL   nRBC 0.0 0.0 - 0.2 %   Neutrophils Relative % 64 %   Neutro Abs 4.3 1.7 - 7.7 K/uL   Lymphocytes Relative 22 %   Lymphs Abs 1.5 0.7 - 4.0 K/uL   Monocytes Relative 9 %   Monocytes Absolute 0.6 0.1 - 1.0 K/uL   Eosinophils Relative 4 %   Eosinophils Absolute 0.2 0.0 - 0.5 K/uL   Basophils Relative 1 %   Basophils Absolute 0.0 0.0 - 0.1 K/uL   Immature Granulocytes 0 %   Abs  Immature Granulocytes 0.01 0.00 - 0.07 K/uL  Basic metabolic panel with GFR     Status: Abnormal   Collection Time: 04/15/24 11:59 AM  Result Value Ref Range   Sodium 142 135 - 145 mmol/L   Potassium 4.5 3.5 - 5.1 mmol/L   Chloride 113 (H) 98 - 111 mmol/L   CO2 19 (L) 22 - 32 mmol/L   Glucose, Bld 107 (H) 70 - 99 mg/dL   BUN 51 (H) 8 - 23 mg/dL   Creatinine, Ser 5.40 (H) 0.44 - 1.00 mg/dL   Calcium  9.1 8.9 - 10.3 mg/dL   GFR, Estimated 12 (L) >60 mL/min   Anion gap 10 5 - 15  Troponin I (High Sensitivity)     Status: Abnormal   Collection Time: 04/15/24 11:59 AM  Result Value Ref Range   Troponin I (High Sensitivity) 34 (H) <18 ng/L  Sedimentation rate     Status: None   Collection Time: 04/15/24 11:59 AM  Result Value Ref Range   Sed Rate 19 0 - 22 mm/hr  Troponin I (High Sensitivity)     Status: Abnormal   Collection Time: 04/15/24  2:23 PM  Result Value Ref Range   Troponin I (High Sensitivity) 31 (H) <18 ng/L  D-dimer, quantitative     Status: Abnormal   Collection Time: 04/15/24  3:50 PM  Result Value Ref Range   D-Dimer, Quant 1.17 (H) 0.00 - 0.50 ug/mL-FEU   ECHOCARDIOGRAM LIMITED Result Date: 04/15/2024    ECHOCARDIOGRAM LIMITED REPORT   Patient Name:   DORATHA DISCALA Date of Exam: 04/15/2024 Medical Rec #:  [id number]      Height:       68.0 in Accession #:    [id number]     Weight:       132.3 lb Date of Birth:  02/12/1956      BSA:          1.714 m Patient Age:    68 years       BP:           160/100 mmHg Patient Gender: F              HR:           64 bpm. Exam  Location:  Inpatient Procedure: Limited Echo, Cardiac Doppler and Color Doppler (Both Spectral and            Color Flow Doppler were utilized during procedure). Indications:    R07.9* Chest pain, unspecified; R06.02 SOB  History:        Patient has prior history of Echocardiogram examinations, most                 recent 03/14/2024. Aortic Valve Disease, Signs/Symptoms:Dyspnea                 and Shortness of Breath; Risk Factors:Current Smoker,                 Dyslipidemia, Hypertension and Diabetes. Ascending aorta                 replacement.  Sonographer:    Raynelle Callow RDCS Referring Phys: [id number] [facility]  Sonographer Comments: Technically difficult study due to poor echo windows. IMPRESSIONS  1. Left ventricular ejection fraction, by estimation, is 60 to 65%. The left ventricle has normal function. The left ventricle has no regional wall motion abnormalities. There is severe left ventricular hypertrophy.  2. Right ventricular systolic function is normal.  The right ventricular size is normal. There is moderately elevated pulmonary artery systolic pressure. The estimated right ventricular systolic pressure is 59.1 mmHg.  3. The MR vena contracta is 0.6 cm. . Moderate to severe mitral valve regurgitation. No evidence of mitral stenosis.  4. The tricuspid valve is abnormal. Tricuspid valve regurgitation is moderate.  5. The aortic valve is tricuspid. Aortic valve regurgitation is moderate.  6. The inferior vena cava is dilated in size with <50% respiratory variability, suggesting right atrial pressure of 15 mmHg. FINDINGS  Left Ventricle: Left ventricular ejection fraction, by estimation, is 60 to 65%. The left ventricle has normal function. The left ventricle has no regional wall motion abnormalities. There is severe left ventricular hypertrophy. Right Ventricle: The right ventricular size is normal. Right vetricular wall thickness was not well visualized. Right ventricular systolic function is normal.  There is moderately elevated pulmonary artery systolic pressure. The tricuspid regurgitant velocity is 3.32 m/s, and with an assumed right atrial pressure of 15 mmHg, the estimated right ventricular systolic pressure is 59.1 mmHg. Mitral Valve: The MR vena contracta is 0.6 cm. Moderate to severe mitral valve regurgitation. No evidence of mitral valve stenosis. Tricuspid Valve: The tricuspid valve is abnormal. Tricuspid valve regurgitation is moderate . No evidence of tricuspid stenosis. Aortic Valve: The aortic valve is tricuspid. Aortic valve regurgitation is moderate. Aortic regurgitation PHT measures 384 msec. Pulmonic Valve: The pulmonic valve was not well visualized. Pulmonic valve regurgitation is not visualized. No evidence of pulmonic stenosis. Aorta: The aortic root and ascending aorta are structurally normal, with no evidence of dilitation. Venous: The inferior vena cava is dilated in size with less than 50% respiratory variability, suggesting right atrial pressure of 15 mmHg. Additional Comments: Spectral Doppler performed. Color Doppler performed.  LEFT VENTRICLE PLAX 2D LVIDd:         5.50 cm LVIDs:         3.70 cm LV PW:         1.60 cm LV IVS:        1.60 cm LVOT diam:     2.30 cm LVOT Area:     4.15 cm  LV Volumes (MOD) LV vol d, MOD A2C: 81.4 ml LV vol d, MOD A4C: 109.2 ml LV vol s, MOD A2C: 27.2 ml LV vol s, MOD A4C: 60.7 ml LV SV MOD A2C:     54.2 ml LV SV MOD A4C:     109.2 ml LV SV MOD BP:      52.7 ml IVC IVC diam: 2.40 cm AORTIC VALVE AI PHT:      384 msec  AORTA Ao Root diam: 3.00 cm Ao Asc diam:  3.40 cm MR Peak grad:    114.1 mmHg   TRICUSPID VALVE MR Mean grad:    78.0 mmHg    TR Peak grad:   44.1 mmHg MR Vmax:         534.00 cm/s  TR Vmax:        332.00 cm/s MR Vmean:        413.0 cm/s MR PISA:         2.26 cm     SHUNTS MR PISA Eff ROA: 16 mm       Systemic Diam: 2.30 cm MR PISA Radius:  0.60 cm Armida Lander MD Electronically signed by Armida Lander MD Signature Date/Time:  04/15/2024/3:57:47 PM    Final    DG Chest Port 1 View Result Date: 04/15/2024 CLINICAL DATA:  Chest pain, shortness  of breath EXAM: PORTABLE CHEST - 1 VIEW COMPARISON:  03/31/2024 FINDINGS: Mild interstitial and alveolar opacities involving bases more than apices, right greater than left, increased from previous. No pneumothorax. Mild cardiomegaly.  Ectatic thoracic aorta. No effusion. Sternotomy wires. IMPRESSION: Mild interstitial and alveolar opacities, right greater than left. Electronically Signed   By: Nicoletta Barrier M.D.   On: 04/15/2024 12:57   CT Chest Wo Contrast Result Date: 04/15/2024 CLINICAL DATA:  Chest pain, shortness of breath EXAM: CT CHEST WITHOUT CONTRAST TECHNIQUE: Multidetector CT imaging of the chest was performed following the standard protocol without IV contrast. RADIATION DOSE REDUCTION: This exam was performed according to the departmental dose-optimization program which includes automated exposure control, adjustment of the mA and/or kV according to patient size and/or use of iterative reconstruction technique. COMPARISON:  03/31/2024 FINDINGS: Cardiovascular: Mild cardiomegaly with left-sided enlargement. Small pericardial effusion stable. Post ascending aorta repair with displaced intimal calcifications in the distal arch implying persistent dissection flap, as was noted on all postop studies back to 07/24/2021. estimated 4.3 cm dilatation of the proximal descending thoracic aorta, grossly stable by my measurement. Mediastinum/Nodes: No acute mediastinal hematoma, mass, or adenopathy. Lungs/Pleura: Trace pleural fluid right greater than left as before. No pneumothorax. Subpleural scarring and septal lines peripherally at the lung bases, right greater than left. Lungs otherwise clear. Upper Abdomen: Progressive left renal atrophy and cystic change since 12/02/2020. Displaced intimal calcifications in the abdominal aorta. No acute findings. Musculoskeletal: Sternotomy wires. IMPRESSION:  1. No acute findings. 2. Post ascending aorta repair with persistent dissection flap in the distal arch. 3. 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 4. Progressive left renal atrophy and cystic change since 12/02/2020. 5.  Aortic Atherosclerosis (ICD10-I70.0). Electronically Signed   By: Nicoletta Barrier M.D.   On: 04/15/2024 12:56   ECHOCARDIOGRAM COMPLETE Result Date: 04/02/2024    ECHOCARDIOGRAM REPORT   Patient Name:   ELFRIDA LASHWAY Date of Exam: 04/02/2024 Medical Rec #:  [id number]      Height:       68.0 in Accession #:    [id number]     Weight:       134.5 lb Date of Birth:  1956-10-09      BSA:          1.727 m Patient Age:    68 years       BP:           141/94 mmHg Patient Gender: F              HR:           79 bpm. Exam Location:  Inpatient Procedure: 2D Echo, Color Doppler and Cardiac Doppler (Both Spectral and Color            Flow Doppler were utilized during procedure). Indications:    CHF I50.9  History:        Patient has prior history of Echocardiogram examinations, most                 recent 12/02/2020. Aortic Dissection repair 2021; Risk                 Factors:Diabetes, Dyslipidemia and Hypertension.  Sonographer:    Hersey Lorenzo RDCS Referring Phys: [id number] [facility] IMPRESSIONS  1. Left ventricular ejection fraction, by estimation, is 55%. The left ventricle has normal function. The left ventricle has no regional wall motion abnormalities. There is moderate concentric left ventricular hypertrophy. Left ventricular diastolic parameters are consistent  with Grade I diastolic dysfunction (impaired relaxation).  2. Right ventricular systolic function is normal. The right ventricular size is normal. Tricuspid regurgitation signal is inadequate for assessing PA pressure.  3. Left atrial size was severely dilated.  4. The mitral valve is normal in structure. Mild mitral valve regurgitation. No evidence of mitral stenosis.  5. The aortic valve is tricuspid. There is mild calcification of  the aortic valve. Aortic valve regurgitation is mild. No aortic stenosis is present.  6. The inferior vena cava is normal in size with greater than 50% respiratory variability, suggesting right atrial pressure of 3 mmHg.  7. A small pericardial effusion is present.  8. Possible residual dissection visualized in the abdominal aorta. FINDINGS  Left Ventricle: Left ventricular ejection fraction, by estimation, is 55%. The left ventricle has normal function. The left ventricle has no regional wall motion abnormalities. The left ventricular internal cavity size was normal in size. There is moderate concentric left ventricular hypertrophy. Left ventricular diastolic parameters are consistent with Grade I diastolic dysfunction (impaired relaxation). Right Ventricle: The right ventricular size is normal. No increase in right ventricular wall thickness. Right ventricular systolic function is normal. Tricuspid regurgitation signal is inadequate for assessing PA pressure. Left Atrium: Left atrial size was severely dilated. Right Atrium: Right atrial size was normal in size. Pericardium: A small pericardial effusion is present. Mitral Valve: The mitral valve is normal in structure. Mild mitral valve regurgitation. No evidence of mitral valve stenosis. Tricuspid Valve: The tricuspid valve is normal in structure. Tricuspid valve regurgitation is trivial. Aortic Valve: The aortic valve is tricuspid. There is mild calcification of the aortic valve. Aortic valve regurgitation is mild. Aortic regurgitation PHT measures 962 msec. No aortic stenosis is present. Pulmonic Valve: The pulmonic valve was normal in structure. Pulmonic valve regurgitation is not visualized. Aorta: The aortic root is normal in size and structure. Venous: The inferior vena cava is normal in size with greater than 50% respiratory variability, suggesting right atrial pressure of 3 mmHg. IAS/Shunts: No atrial level shunt detected by color flow Doppler.  LEFT  VENTRICLE PLAX 2D LVIDd:         5.10 cm   Diastology LVIDs:         3.20 cm   LV e' medial:    3.59 cm/s LV PW:         1.70 cm   LV E/e' medial:  16.0 LV IVS:        1.70 cm   LV e' lateral:   3.48 cm/s LVOT diam:     2.10 cm   LV E/e' lateral: 16.5 LV SV:         69 LV SV Index:   40 LVOT Area:     3.46 cm  RIGHT VENTRICLE             IVC RV S prime:     11.50 cm/s  IVC diam: 1.40 cm TAPSE (M-mode): 1.3 cm LEFT ATRIUM              Index        RIGHT ATRIUM           Index LA diam:        3.70 cm  2.14 cm/m   RA Area:     15.30 cm LA Vol (A2C):   105.0 ml 60.81 ml/m  RA Volume:   37.80 ml  21.89 ml/m LA Vol (A4C):   93.0 ml  53.86 ml/m LA Biplane Vol:  109.0 ml 63.13 ml/m  AORTIC VALVE LVOT Vmax:   93.30 cm/s LVOT Vmean:  59.800 cm/s LVOT VTI:    0.200 m AI PHT:      962 msec  AORTA Ao Root diam: 3.40 cm Ao Asc diam:  2.90 cm MITRAL VALVE MV Area (PHT): 5.31 cm    SHUNTS MV Decel Time: 143 msec    Systemic VTI:  0.20 m MV E velocity: 57.40 cm/s  Systemic Diam: 2.10 cm MV A velocity: 67.70 cm/s MV E/A ratio:  0.85 Dalton McleanMD Electronically signed by Archer Bear Signature Date/Time: 04/02/2024/9:21:39 AM    Final    US  RENAL Result Date: 04/01/2024 CLINICAL DATA:  [id number] AKI (acute kidney injury) (HCC) [id number] EXAM: RENAL / URINARY TRACT ULTRASOUND COMPLETE COMPARISON:  05/06/2023. FINDINGS: The right kidney measured 10.1 cm and the left kidney measured 7.1 cm. The kidneys demonstrate increased echogenicity consistent with chronic medical renal disease. Midpole cyst on the left measures 2.4 cm. Upper pole cyst on the right measures 1.5 cm. No follow up recommended for the cysts. No shadowing stones are seen. No hydronephrosis identified. Bladder: Appears normal for degree of bladder distention. IMPRESSION: Echogenic kidneys consistent with chronic medical renal disease. No acute abnormalities. Electronically Signed   By: Sydell Eva M.D.   On: 04/01/2024 14:41   CT Chest Wo Contrast Result  Date: 03/31/2024 CLINICAL DATA:  Shortness breath, chest pain and diaphoresis. EXAM: CT CHEST WITHOUT CONTRAST TECHNIQUE: Multidetector CT imaging of the chest was performed following the standard protocol without IV contrast. RADIATION DOSE REDUCTION: This exam was performed according to the departmental dose-optimization program which includes automated exposure control, adjustment of the mA and/or kV according to patient size and/or use of iterative reconstruction technique. COMPARISON:  Portable chest from today, chest CTs without contrast 12/09/2023 and 11/15/2022. FINDINGS: Cardiovascular: There is mild cardiomegaly, small pericardial effusion interval increased now 7 mm thick. There are three-vessel coronary calcifications greatest in the circumflex coronary artery. Chronic enlargement of the pulmonary trunk indicating arterial hypertension and measuring 3.6 cm. There is prominence of the central pulmonary veins more than previously. 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 This extends from the sinotubular junction to the ascending aortic/arch junction. The the proximal arch distal to the graft today measures 4.3 cm on 3:68, previously 4.2 cm. Dilatation in the aortic isthmus is 4.9 cm craniocaudal on 6:94, increased from the last CT when this measured 4.2 cm. The descending aorta measures 3.9 cm AP on 6:102, stable. The distal descending aorta is 3.7 cm on 6:87, also stable. Mediastinum/Nodes: There is a 1.5 cm heterogeneous solid nodule posteriorly in the lower pole of the right thyroid lobe. Nonemergent follow-up ultrasound recommended. Axillary spaces are clear. No intrathoracic adenopathy is seen without contrast. There is a chronically patulous esophagus with a normal wall thickness. The trachea and main bronchi are patent. Lungs/Pleura: There are bilateral small layering pleural effusions. There is interlobular septal edema in the lung apices and bases consistent with CHF or fluid overload. There is  diffuse bronchial thickening which could be congestive and/or bronchitic. There are bilateral ground-glass opacities with relative peripheral sparing showing a gravity dependent as well as a basal gradient. This is probably ground-glass edema but underlying pneumonia would be difficult to exclude. Upper Abdomen: Asymmetric left renal atrophy and cysts. Abdominal aortic atherosclerosis. Abdominal findings. Musculoskeletal: No chest wall mass or suspicious bone lesions identified. IMPRESSION: 1. Cardiomegaly with small pericardial effusion, interlobular septal edema, and small pleural effusions consistent with CHF  or fluid overload. 2. Bilateral ground-glass opacities with relative peripheral sparing, probably ground-glass edema but underlying pneumonia would be difficult to exclude. 3. Diffuse bronchial thickening which could be congestive and/or bronchitic. 4. Aortic atherosclerosis with aortic graft repair. 5. Slightly increased aneurysmal prominence in the proximal aortic arch but only by 1 mm, more significant increased prominence of the aortic isthmus now 4.9 mm, increased 7 mm. Recommend vascular surgery referral and MRA or CTA. 6. Chronic enlargement of the pulmonary trunk indicating arterial hypertension. 7. 1.5 cm heterogeneous solid nodule posteriorly in the lower pole of the right thyroid lobe. Nonemergent follow-up ultrasound recommended. 8. Asymmetric left renal atrophy and cysts. 9. These results will be called to the ordering clinician or representative by the Radiologist Assistant, and communication documented in the PACS or [employer]. Aortic Atherosclerosis (ICD10-I70.0). Electronically Signed   By: Denman Fischer M.D.   On: 03/31/2024 05:12   DG Chest Port 1 View Result Date: 03/31/2024 CLINICAL DATA:  68 year old female with shortness of breath. EXAM: PORTABLE CHEST 1 VIEW COMPARISON:  Chest CT 12/09/2023 and earlier. FINDINGS: Portable AP semi upright view at 0403 hours. Cardiomegaly and  chronically enlarged thoracic aorta contours are stable. Stable lung volumes. No pneumothorax, pleural effusion or consolidation. Diffuse increased pulmonary interstitium when compared to previous radiographs. Visualized tracheal air column is within normal limits. No acute osseous abnormality identified. Negative visible bowel gas. IMPRESSION: Chronic cardiomegaly with diffusely increased pulmonary interstitium favored to reflect acute pulmonary edema. Viral/atypical respiratory infection felt less likely. No pleural effusion or consolidation. Electronically Signed   By: Marlise Simpers M.D.   On: 03/31/2024 04:19   EKG: Sinus rhythm, LVH, LAE, no ST-T wave changes.  Assessment and Plan: * Pericarditis Presentation symptoms align with this although not sure she should be this hypoxic. Also does not have any ST-T wave changes that are suggestive of this. Cardiology has been consulted Given Solu-Medrol 125 mg in the ED.  Will begin 0.5 mg/kg prednisone  dosing for now.  Mitral valve regurgitation Moderate to severe based on today's echo. I have reached out to cardiology for assessment this was noted to be mild on prior echo approximately 2 weeks ago. Have suggested possible TEE later.  Hypoxic respiratory failure (HCC) Patient is a smoker but does not have a diagnosis of COPD.  I do not hear significant wheezing on exam.   D-dimer is high. Will check VQ scan. Heparin  GTT if PE is suspected on VQ scan.  Holding for now due to possibility of aortic dissection. Will check BNP CT did not show significant fluid in the lungs but she did have a small right pleural effusion. Add inhalers Oxygen to keep sats greater than 93%.   Anemia due to chronic kidney disease Suspected. Patient had a normal ferritin and iron studies during her last hospitalization.  Her MCV is elevated. Check B12 and folate.  HLD (hyperlipidemia) Low-cholesterol diet Patient was not on a statin prior to admission. Appears the  patient previously been on atorvastatin  10 mg from 2016 at least until 2024. Will resume Lipitor 10 mg daily    CKD (chronic kidney disease) stage 4, GFR 15-29 ml/min (HCC) Creatinine is 3.74 which appears stable from 2 weeks ago. Outpatient nephrology follow-up Avoid nephrotoxic agents Patient also had extensive workup during previous hospitalization which showed chronic left renal artery occlusion.  CT continues to show progressive left renal atrophy. 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 Question if this could be a subacute dissection.  The patient was stable from a blood pressure standpoint and does have a high D-dimer Per cardiology, could be a increased dissection with mitral valve involvement.  This is not consistent with a type a require emergent surgical repair and will be treated medically with good blood pressure control.  Essential hypertension Patient reports taking her meds daily.  She is unsure if she is taking them correctly. Will resume her amlodipine , Coreg , and hydralazine .  Tobacco dependence Patient was offered and was unsure about the need for nicotine  replacement. The patch has been ordered for her as needed.  DM (diabetes mellitus), type 2 (HCC) On diet usually Given steroid dosing will follow CBGs, diabetes diet, sliding scale insulin   Chronic hepatitis C virus infection (HCC) Monitor LFTs Outpatient follow-up      Advance Care Planning:   Code Status: Prior Full  Consults: Cardiology  Family Communication: Patient at bedside Patient currently lives alone.  She has a daughter who is a Child psychotherapist who is nearby who checks on her.  DVT prophylaxis: On heparin  GTT per pharmacy protocol until PE can be effectively ruled out.  Severity of Illness: The appropriate patient status for this patient is INPATIENT. Inpatient status is judged to be reasonable  and necessary in order to provide the required intensity of service to ensure the patient's safety. The patient's presenting symptoms, physical exam findings, and initial radiographic and laboratory data in the context of their chronic comorbidities is felt to place them at high risk for further clinical deterioration. Furthermore, it is not anticipated that the patient will be medically stable for discharge from the hospital within 2 midnights of admission.  Patient currently needs oxygen to maintain her saturations. * I certify that at the point of admission it is my clinical judgment that the patient will require inpatient hospital care spanning beyond 2 midnights from the point of admission due to high intensity of service, high risk for further deterioration and high frequency of surveillance required.*  Author: Granville Layer, MD 04/15/2024 3:51 PM  For on call review [email or web address].

## 2024-04-15 NOTE — ED Notes (Signed)
 Ambulated pt 65ft O2 stayed at 93 while walking when getting back in bed pt stated they felt sob

## 2024-04-15 NOTE — Assessment & Plan Note (Addendum)
 Creatinine is 3.74 which appears stable from 2 weeks ago. Outpatient nephrology follow-up Avoid nephrotoxic agents Patient also had extensive workup during previous hospitalization which showed chronic left renal artery occlusion.  CT continues to show progressive left renal atrophy. Trend

## 2024-04-15 NOTE — Assessment & Plan Note (Addendum)
 Presentation symptoms align with this although not sure she should be this hypoxic. Also does not have any ST-T wave changes that are suggestive of this. Cardiology has been consulted Given Solu-Medrol 125 mg in the ED.  Will begin 0.5 mg/kg prednisone  dosing for now.

## 2024-04-15 NOTE — Assessment & Plan Note (Signed)
 Patient was offered and was unsure about the need for nicotine  replacement. The patch has been ordered for her as needed.

## 2024-04-15 NOTE — Assessment & Plan Note (Addendum)
 Moderate to severe based on today's echo. I have reached out to cardiology for assessment this was noted to be mild on prior echo approximately 2 weeks ago. Have suggested possible TEE later.

## 2024-04-15 NOTE — Assessment & Plan Note (Signed)
 Suspected. Patient had a normal ferritin and iron studies during her last hospitalization.  Her MCV is elevated. Check B12 and folate.

## 2024-04-15 NOTE — Assessment & Plan Note (Signed)
 Patient reports taking her meds daily.  She is unsure if she is taking them correctly. Will resume her amlodipine , Coreg , and hydralazine .

## 2024-04-15 NOTE — Assessment & Plan Note (Addendum)
 Patient is a smoker but does not have a diagnosis of COPD.  I do not hear significant wheezing on exam.   D-dimer is high. Will check VQ scan. Heparin  GTT if PE is suspected on VQ scan.  Holding for now due to possibility of aortic dissection. Will check BNP CT did not show significant fluid in the lungs but she did have a small right pleural effusion. Add inhalers Oxygen to keep sats greater than 93%.

## 2024-04-15 NOTE — Assessment & Plan Note (Addendum)
 Low-cholesterol diet Patient was not on a statin prior to admission. Appears the patient previously been on atorvastatin  10 mg from 2016 at least until 2024. Will resume Lipitor 10 mg daily

## 2024-04-15 NOTE — Progress Notes (Signed)
  Echocardiogram 2D Echocardiogram has been performed.  Mary Stephens 04/15/2024, 3:33 PM

## 2024-04-16 ENCOUNTER — Inpatient Hospital Stay (HOSPITAL_COMMUNITY): Payer: Medicare (Managed Care)

## 2024-04-16 DIAGNOSIS — I10 Essential (primary) hypertension: Secondary | ICD-10-CM

## 2024-04-16 DIAGNOSIS — J9601 Acute respiratory failure with hypoxia: Secondary | ICD-10-CM

## 2024-04-16 DIAGNOSIS — I340 Nonrheumatic mitral (valve) insufficiency: Secondary | ICD-10-CM

## 2024-04-16 DIAGNOSIS — R079 Chest pain, unspecified: Secondary | ICD-10-CM

## 2024-04-16 DIAGNOSIS — F17200 Nicotine dependence, unspecified, uncomplicated: Secondary | ICD-10-CM

## 2024-04-16 DIAGNOSIS — Z95828 Presence of other vascular implants and grafts: Secondary | ICD-10-CM

## 2024-04-16 DIAGNOSIS — I300 Acute nonspecific idiopathic pericarditis: Secondary | ICD-10-CM | POA: Diagnosis not present

## 2024-04-16 DIAGNOSIS — N184 Chronic kidney disease, stage 4 (severe): Secondary | ICD-10-CM | POA: Diagnosis not present

## 2024-04-16 DIAGNOSIS — D631 Anemia in chronic kidney disease: Secondary | ICD-10-CM

## 2024-04-16 LAB — COMPREHENSIVE METABOLIC PANEL WITH GFR
ALT: 13 U/L (ref 0–44)
AST: 15 U/L (ref 15–41)
Albumin: 3.6 g/dL (ref 3.5–5.0)
Alkaline Phosphatase: 62 U/L (ref 38–126)
Anion gap: 9 (ref 5–15)
BUN: 57 mg/dL — ABNORMAL HIGH (ref 8–23)
CO2: 20 mmol/L — ABNORMAL LOW (ref 22–32)
Calcium: 9.2 mg/dL (ref 8.9–10.3)
Chloride: 113 mmol/L — ABNORMAL HIGH (ref 98–111)
Creatinine, Ser: 4.51 mg/dL — ABNORMAL HIGH (ref 0.44–1.00)
GFR, Estimated: 10 mL/min — ABNORMAL LOW (ref >60)
Glucose, Bld: 150 mg/dL — ABNORMAL HIGH (ref 70–99)
Potassium: 4.9 mmol/L (ref 3.5–5.1)
Sodium: 142 mmol/L (ref 135–145)
Total Bilirubin: 0.8 mg/dL (ref 0.0–1.2)
Total Protein: 7.1 g/dL (ref 6.5–8.1)

## 2024-04-16 LAB — GLUCOSE, CAPILLARY
Glucose-Capillary: 112 mg/dL — ABNORMAL HIGH (ref 70–99)
Glucose-Capillary: 113 mg/dL — ABNORMAL HIGH (ref 70–99)
Glucose-Capillary: 134 mg/dL — ABNORMAL HIGH (ref 70–99)
Glucose-Capillary: 145 mg/dL — ABNORMAL HIGH (ref 70–99)
Glucose-Capillary: 159 mg/dL — ABNORMAL HIGH (ref 70–99)
Glucose-Capillary: 167 mg/dL — ABNORMAL HIGH (ref 70–99)

## 2024-04-16 LAB — CBC
HCT: 32.5 % — ABNORMAL LOW (ref 36.0–46.0)
Hemoglobin: 10 g/dL — ABNORMAL LOW (ref 12.0–15.0)
MCH: 32.6 pg (ref 26.0–34.0)
MCHC: 30.8 g/dL (ref 30.0–36.0)
MCV: 105.9 fL — ABNORMAL HIGH (ref 80.0–100.0)
Platelets: 204 10*3/uL (ref 150–400)
RBC: 3.07 MIL/uL — ABNORMAL LOW (ref 3.87–5.11)
RDW: 13 % (ref 11.5–15.5)
WBC: 4.3 10*3/uL (ref 4.0–10.5)
nRBC: 0 % (ref 0.0–0.2)

## 2024-04-16 LAB — HEPARIN LEVEL (UNFRACTIONATED): Heparin Unfractionated: <0.1 [IU]/mL — ABNORMAL LOW (ref 0.30–0.70)

## 2024-04-16 MED ORDER — AMLODIPINE BESYLATE 10 MG PO TABS
5.0000 mg | ORAL_TABLET | Freq: Two times a day (BID) | ORAL | Status: DC
  Administered 2024-04-16 – 2024-04-18 (×4): 5 mg via ORAL
  Filled 2024-04-16 (×4): qty 1

## 2024-04-16 MED ORDER — MELATONIN 5 MG PO TABS
5.0000 mg | ORAL_TABLET | Freq: Every evening | ORAL | Status: DC | PRN
  Administered 2024-04-16 – 2024-04-17 (×2): 5 mg via ORAL
  Filled 2024-04-16 (×2): qty 1

## 2024-04-16 MED ORDER — TECHNETIUM TO 99M ALBUMIN AGGREGATED
4.3000 | Freq: Once | INTRAVENOUS | Status: AC
  Administered 2024-04-16: 4.3 via INTRAVENOUS

## 2024-04-16 NOTE — Plan of Care (Signed)

## 2024-04-16 NOTE — Progress Notes (Signed)
 Progress Note   Patient: Mary Stephens:[id number] DOB: [birth year] DOA: 04/15/2024     1 DOS: the patient was seen and examined on 04/16/2024   Brief hospital course: Mary Stephens is a 68 y.o. female with medical history significant of  T2DM, HTN, HLD, ongoing tobacco use, hep C, CKD stage IV, and history of aortic dissection with repair presents with new onset chest pain. She has hypoxia upon presentation. D-dimer elevated. CT showed a small pericardial effusion as well as a 4.3 cm dilation of the proximal descending thoracic aorta which was thought to be grossly stable. Cardiology consulted for possible pericarditis admitted to [facility] service for further management.  Assessment and Plan: * Pericarditis Echo showed elevated pulmonary artery systolic pressures, high RSVP, moderate to severe MR. No ST-T wave changes that are suggestive of this. Cardiology has been consulted who recommended TEE. Given Solu-Medrol 125 mg in the ED.  Will begin 0.5 mg/kg prednisone  dosing for now.  Mitral valve regurgitation Moderate to severe based on today's echo. Cardiology advised TEE planned for tomorrow.  Hypoxic respiratory failure (HCC) Patient is a smoker but does not have a diagnosis of COPD.  I do not hear significant wheezing on exam.   D-dimer is high. V/Q not available at [facility]. Perfusion scan negative for PE. BNP very high, CT did not show significant fluid in the lungs but she did have a small right pleural effusion. Continue inhalers Oxygen to keep sats greater than 93%. Will follow cardiology recommendations.  Anemia due to chronic kidney disease Patient had a normal ferritin and iron studies during her last hospitalization.  Her MCV is elevated. B12 and folate with in normal limits.  HLD (hyperlipidemia) Continue Lipitor 10 mg daily  CKD (chronic kidney disease) stage 4, GFR 15-29 ml/min (HCC) Creatinine is 3.74 which appears stable from 2 weeks ago. This seems to be worsening.  Outpatient nephrology follow-up Avoid nephrotoxic agents Patient also had extensive workup during previous hospitalization which showed chronic left renal artery occlusion.  CT continues to show progressive left renal atrophy. Trend renal function.  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 Per cardiology, could be a increased dissection with mitral valve involvement.  This is not consistent with a type that require emergent surgical repair and will be treated medically with good blood pressure control.  Essential hypertension Resumed her amlodipine , Coreg , and hydralazine .  Tobacco dependence Smoking cessation advised.  DM (diabetes mellitus), type 2 (HCC) On diet usually Accucheks, diabetes diet, sliding scale insulin   Chronic hepatitis C virus infection (HCC) Monitor LFTs Outpatient follow-up    Out of bed to chair. Incentive spirometry. Nursing supportive care. Fall, aspiration precautions. Diet:  Diet Orders (From admission, onward)     Start     Ordered   04/17/24 0001  Diet NPO time specified  Diet effective midnight        04/16/24 1642   04/16/24 1233  Diet Heart Room service appropriate? Yes; Fluid consistency: Thin  Diet effective ____       Question Answer Comment  Room service appropriate? Yes   Fluid consistency: Thin   Na restriction, if any: 2 gm Na      04/16/24 1232           DVT prophylaxis: SCDs Start: 04/15/24 1608  Level of care: Telemetry   Code Status: Full Code  Subjective: Patient is seen and examined today morning. She is lying in the bed. Chest pain,  shortness of breath better than yesterday. Denies palpitations.  Physical Exam: Vitals:   04/15/24 2110 04/15/24 2322 04/16/24 0441 04/16/24 1305  BP: (!) 157/99 (!) 136/97 (!) 153/95 (!) 150/94  Pulse: 66 69 72 83  Resp: (!) 21 18 18 20   Temp: 97.7 F (36.5 C) 98 F (36.7 C) 97.7 F (36.5 C) 97.9 F (36.6 C)  TempSrc: Oral Oral  Oral Oral  SpO2: 98% 98% 95% 96%  Weight:      Height:        General - Elderly African American female, no apparent distress HEENT - PERRLA, EOMI, atraumatic head, non tender sinuses. Lung - Clear, basal rales, rhonchi, no wheezes. Heart - S1, S2 heard, no murmurs, rubs, trace pedal edema. Abdomen - Soft, non tender, bowel sounds good Neuro - Alert, awake and oriented x 3, non focal exam. Skin - Warm and dry.  Data Reviewed:      Latest Ref Rng & Units 04/16/2024    3:48 AM 04/15/2024   11:59 AM 04/03/2024    8:10 AM  CBC  WBC 4.0 - 10.5 K/uL 4.3  6.7  4.7   Hemoglobin 12.0 - 15.0 g/dL 29.5  62.1  30.8   Hematocrit 36.0 - 46.0 % 32.5  34.0  32.8   Platelets 150 - 400 K/uL 204  227  176       Latest Ref Rng & Units 04/16/2024    3:48 AM 04/15/2024   11:59 AM 04/03/2024    5:01 AM  BMP  Glucose 70 - 99 mg/dL 657  846  80   BUN 8 - 23 mg/dL 57  51  45   Creatinine 0.44 - 1.00 mg/dL 9.62  9.52  8.41   Sodium 135 - 145 mmol/L 142  142  139   Potassium 3.5 - 5.1 mmol/L 4.9  4.5  4.2   Chloride 98 - 111 mmol/L 113  113  107   CO2 22 - 32 mmol/L 20  19  19    Calcium  8.9 - 10.3 mg/dL 9.2  9.1  8.8    NM Pulmonary Perfusion Result Date: 04/16/2024 CLINICAL DATA:  Concern for pulmonary embolism.  Hypoxemia EXAM: NUCLEAR MEDICINE PERFUSION LUNG SCAN TECHNIQUE: Perfusion images were obtained in multiple projections after intravenous injection of radiopharmaceutical. RADIOPHARMACEUTICALS:  4.3 mCi Tc-64m MAA COMPARISON:  Chest radiograph 04/15/2024 FINDINGS: No wedge-shaped peripheral perfusion defect within LEFT or RIGHT lung to suggest acute pulmonary embolism. Normal perfusion pattern. IMPRESSION: No evidence acute pulmonary embolism. Electronically Signed   By: Deboraha Fallow M.D.   On: 04/16/2024 11:04   ECHOCARDIOGRAM LIMITED Result Date: 04/15/2024    ECHOCARDIOGRAM LIMITED REPORT   Patient Name:   Mary Stephens Date of Exam: 04/15/2024 Medical Rec #:  [id number]      Height:       68.0  in Accession #:    [id number]     Weight:       132.3 lb Date of Birth:  January 04, 1956      BSA:          1.714 m Patient Age:    68 years       BP:           160/100 mmHg Patient Gender: F              HR:           64 bpm. Exam Location:  Inpatient Procedure: Limited Echo, Cardiac Doppler and Color Doppler (Both Spectral and  Color Flow Doppler were utilized during procedure). Indications:    R07.9* Chest pain, unspecified; R06.02 SOB  History:        Patient has prior history of Echocardiogram examinations, most                 recent 03/14/2024. Aortic Valve Disease, Signs/Symptoms:Dyspnea                 and Shortness of Breath; Risk Factors:Current Smoker,                 Dyslipidemia, Hypertension and Diabetes. Ascending aorta                 replacement.  Sonographer:    Raynelle Callow RDCS Referring Phys: [id number] [facility]  Sonographer Comments: Technically difficult study due to poor echo windows. IMPRESSIONS  1. Left ventricular ejection fraction, by estimation, is 60 to 65%. The left ventricle has normal function. The left ventricle has no regional wall motion abnormalities. There is severe left ventricular hypertrophy.  2. Right ventricular systolic function is normal. The right ventricular size is normal. There is moderately elevated pulmonary artery systolic pressure. The estimated right ventricular systolic pressure is 59.1 mmHg.  3. The MR vena contracta is 0.6 cm. . Moderate to severe mitral valve regurgitation. No evidence of mitral stenosis.  4. The tricuspid valve is abnormal. Tricuspid valve regurgitation is moderate.  5. The aortic valve is tricuspid. Aortic valve regurgitation is moderate.  6. The inferior vena cava is dilated in size with <50% respiratory variability, suggesting right atrial pressure of 15 mmHg. FINDINGS  Left Ventricle: Left ventricular ejection fraction, by estimation, is 60 to 65%. The left ventricle has normal function. The left ventricle has no regional wall  motion abnormalities. There is severe left ventricular hypertrophy. Right Ventricle: The right ventricular size is normal. Right vetricular wall thickness was not well visualized. Right ventricular systolic function is normal. There is moderately elevated pulmonary artery systolic pressure. The tricuspid regurgitant velocity is 3.32 m/s, and with an assumed right atrial pressure of 15 mmHg, the estimated right ventricular systolic pressure is 59.1 mmHg. Mitral Valve: The MR vena contracta is 0.6 cm. Moderate to severe mitral valve regurgitation. No evidence of mitral valve stenosis. Tricuspid Valve: The tricuspid valve is abnormal. Tricuspid valve regurgitation is moderate . No evidence of tricuspid stenosis. Aortic Valve: The aortic valve is tricuspid. Aortic valve regurgitation is moderate. Aortic regurgitation PHT measures 384 msec. Pulmonic Valve: The pulmonic valve was not well visualized. Pulmonic valve regurgitation is not visualized. No evidence of pulmonic stenosis. Aorta: The aortic root and ascending aorta are structurally normal, with no evidence of dilitation. Venous: The inferior vena cava is dilated in size with less than 50% respiratory variability, suggesting right atrial pressure of 15 mmHg. Additional Comments: Spectral Doppler performed. Color Doppler performed.  LEFT VENTRICLE PLAX 2D LVIDd:         5.50 cm LVIDs:         3.70 cm LV PW:         1.60 cm LV IVS:        1.60 cm LVOT diam:     2.30 cm LVOT Area:     4.15 cm  LV Volumes (MOD) LV vol d, MOD A2C: 81.4 ml LV vol d, MOD A4C: 109.2 ml LV vol s, MOD A2C: 27.2 ml LV vol s, MOD A4C: 60.7 ml LV SV MOD A2C:     54.2 ml LV SV MOD A4C:  109.2 ml LV SV MOD BP:      52.7 ml IVC IVC diam: 2.40 cm AORTIC VALVE AI PHT:      384 msec  AORTA Ao Root diam: 3.00 cm Ao Asc diam:  3.40 cm MR Peak grad:    114.1 mmHg   TRICUSPID VALVE MR Mean grad:    78.0 mmHg    TR Peak grad:   44.1 mmHg MR Vmax:         534.00 cm/s  TR Vmax:        332.00 cm/s MR  Vmean:        413.0 cm/s MR PISA:         2.26 cm     SHUNTS MR PISA Eff ROA: 16 mm       Systemic Diam: 2.30 cm MR PISA Radius:  0.60 cm Armida Lander MD Electronically signed by Armida Lander MD Signature Date/Time: 04/15/2024/3:57:47 PM    Final    DG Chest Port 1 View Result Date: 04/15/2024 CLINICAL DATA:  Chest pain, shortness of breath EXAM: PORTABLE CHEST - 1 VIEW COMPARISON:  03/31/2024 FINDINGS: Mild interstitial and alveolar opacities involving bases more than apices, right greater than left, increased from previous. No pneumothorax. Mild cardiomegaly.  Ectatic thoracic aorta. No effusion. Sternotomy wires. IMPRESSION: Mild interstitial and alveolar opacities, right greater than left. Electronically Signed   By: Nicoletta Barrier M.D.   On: 04/15/2024 12:57   CT Chest Wo Contrast Result Date: 04/15/2024 CLINICAL DATA:  Chest pain, shortness of breath EXAM: CT CHEST WITHOUT CONTRAST TECHNIQUE: Multidetector CT imaging of the chest was performed following the standard protocol without IV contrast. RADIATION DOSE REDUCTION: This exam was performed according to the departmental dose-optimization program which includes automated exposure control, adjustment of the mA and/or kV according to patient size and/or use of iterative reconstruction technique. COMPARISON:  03/31/2024 FINDINGS: Cardiovascular: Mild cardiomegaly with left-sided enlargement. Small pericardial effusion stable. Post ascending aorta repair with displaced intimal calcifications in the distal arch implying persistent dissection flap, as was noted on all postop studies back to 07/24/2021. estimated 4.3 cm dilatation of the proximal descending thoracic aorta, grossly stable by my measurement. Mediastinum/Nodes: No acute mediastinal hematoma, mass, or adenopathy. Lungs/Pleura: Trace pleural fluid right greater than left as before. No pneumothorax. Subpleural scarring and septal lines peripherally at the lung bases, right greater than left. Lungs  otherwise clear. Upper Abdomen: Progressive left renal atrophy and cystic change since 12/02/2020. Displaced intimal calcifications in the abdominal aorta. No acute findings. Musculoskeletal: Sternotomy wires. IMPRESSION: 1. No acute findings. 2. Post ascending aorta repair with persistent dissection flap in the distal arch. 3. 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 4. Progressive left renal atrophy and cystic change since 12/02/2020. 5.  Aortic Atherosclerosis (ICD10-I70.0). Electronically Signed   By: Nicoletta Barrier M.D.   On: 04/15/2024 12:56    Family Communication: Discussed with patient, understand and agree. All questions answered.  Disposition: Status is: Inpatient Remains inpatient appropriate because: TEE, cardiology work up.  Planned Discharge Destination: Home with Home Health     Time spent: 40 minutes  Author: Aisha Hove, MD 04/16/2024 4:42 PM Secure chat 7am to 7pm For on call review [email or web address]

## 2024-04-16 NOTE — Progress Notes (Addendum)
 Rounding Note    Patient Name: Mary Stephens Date of Encounter: 04/16/2024  [facility] Cardiologist:New   Subjective   PT denies SOB   No CP   Feeling much better   Inpatient Medications    Scheduled Meds:  amLODipine   5 mg Oral Daily   aspirin  EC  325 mg Oral Daily   atorvastatin   10 mg Oral q1800   carvedilol   25 mg Oral BID   cholecalciferol  5,000 Units Oral Daily   hydrALAZINE   100 mg Oral TID   insulin  aspart  0-9 Units Subcutaneous Q4H   predniSONE   30 mg Oral Q breakfast   Continuous Infusions:  PRN Meds: acetaminophen , ipratropium-albuterol, metoprolol  tartrate, morphine  injection, nicotine , polyethylene glycol   Vital Signs    Vitals:   04/15/24 1813 04/15/24 2110 04/15/24 2322 04/16/24 0441  BP: (!) 157/102 (!) 157/99 (!) 136/97 (!) 153/95  Pulse: 66 66 69 72  Resp: (!) 22 (!) 21 18 18   Temp: 97.8 F (36.6 C) 97.7 F (36.5 C) 98 F (36.7 C) 97.7 F (36.5 C)  TempSrc: Oral Oral Oral Oral  SpO2: 96% 98% 98% 95%  Weight:      Height:       No intake or output data in the 24 hours ending 04/16/24 0947    04/15/2024   11:13 AM 04/03/2024    3:31 AM 04/02/2024    5:00 AM  Last 3 Weights  Weight (lbs) 132 lb 4.4 oz 134 lb 4.8 oz 134 lb 8 oz  Weight (kg) 60 kg 60.918 kg 61.009 kg      Telemetry    SR - Personally Reviewed  ECG     - Personally Reviewed  Physical Exam   GEN: No acute distress.   Neck: No JVD Cardiac: RRR,   II/VI systolic murmur LSB and apex  Respiratory: Clear to auscultation bilaterally. GI: Soft, nontender, no masses EXt  No LE edema  Labs    High Sensitivity Troponin:   Recent Labs  Lab 03/31/24 0428 03/31/24 0603 03/31/24 1607 04/15/24 1159 04/15/24 1423  TROPONINIHS 37* 127* 487* 34* 31*     Chemistry Recent Labs  Lab 04/15/24 1159 04/16/24 0348  NA 142 142  K 4.5 4.9  CL 113* 113*  CO2 19* 20*  GLUCOSE 107* 150*  BUN 51* 57*  CREATININE 3.74* 4.51*  CALCIUM  9.1 9.2  PROT  --  7.1   ALBUMIN   --  3.6  AST  --  15  ALT  --  13  ALKPHOS  --  62  BILITOT  --  0.8  GFRNONAA 12* 10*  ANIONGAP 10 9    Lipids No results for input(s): "CHOL", "TRIG", "HDL", "LABVLDL", "LDLCALC", "CHOLHDL" in the last 168 hours.  Hematology Recent Labs  Lab 04/15/24 1159 04/16/24 0348  WBC 6.7 4.3  RBC 3.31* 3.07*  HGB 10.8* 10.0*  HCT 34.0* 32.5*  MCV 102.7* 105.9*  MCH 32.6 32.6  MCHC 31.8 30.8  RDW 13.2 13.0  PLT 227 204   Thyroid No results for input(s): "TSH", "FREET4" in the last 168 hours.  BNP Recent Labs  Lab 04/15/24 1159  BNP 3,207.9*    DDimer  Recent Labs  Lab 04/15/24 1550  DDIMER 1.17*     Radiology    ECHOCARDIOGRAM LIMITED Result Date: 04/15/2024    ECHOCARDIOGRAM LIMITED REPORT   Patient Name:   Mary Stephens Date of Exam: 04/15/2024 Medical Rec #:  [id number]  Height:       68.0 in Accession #:    [id number]     Weight:       132.3 lb Date of Birth:  1956-01-28      BSA:          1.714 m Patient Age:    68 years       BP:           160/100 mmHg Patient Gender: F              HR:           64 bpm. Exam Location:  Inpatient Procedure: Limited Echo, Cardiac Doppler and Color Doppler (Both Spectral and            Color Flow Doppler were utilized during procedure). Indications:    R07.9* Chest pain, unspecified; R06.02 SOB  History:        Patient has prior history of Echocardiogram examinations, most                 recent 03/14/2024. Aortic Valve Disease, Signs/Symptoms:Dyspnea                 and Shortness of Breath; Risk Factors:Current Smoker,                 Dyslipidemia, Hypertension and Diabetes. Ascending aorta                 replacement.  Sonographer:    Raynelle Callow RDCS Referring Phys: [id number] [facility]  Sonographer Comments: Technically difficult study due to poor echo windows. IMPRESSIONS  1. Left ventricular ejection fraction, by estimation, is 60 to 65%. The left ventricle has normal function. The left ventricle has no regional wall motion  abnormalities. There is severe left ventricular hypertrophy.  2. Right ventricular systolic function is normal. The right ventricular size is normal. There is moderately elevated pulmonary artery systolic pressure. The estimated right ventricular systolic pressure is 59.1 mmHg.  3. The MR vena contracta is 0.6 cm. . Moderate to severe mitral valve regurgitation. No evidence of mitral stenosis.  4. The tricuspid valve is abnormal. Tricuspid valve regurgitation is moderate.  5. The aortic valve is tricuspid. Aortic valve regurgitation is moderate.  6. The inferior vena cava is dilated in size with <50% respiratory variability, suggesting right atrial pressure of 15 mmHg. FINDINGS  Left Ventricle: Left ventricular ejection fraction, by estimation, is 60 to 65%. The left ventricle has normal function. The left ventricle has no regional wall motion abnormalities. There is severe left ventricular hypertrophy. Right Ventricle: The right ventricular size is normal. Right vetricular wall thickness was not well visualized. Right ventricular systolic function is normal. There is moderately elevated pulmonary artery systolic pressure. The tricuspid regurgitant velocity is 3.32 m/s, and with an assumed right atrial pressure of 15 mmHg, the estimated right ventricular systolic pressure is 59.1 mmHg. Mitral Valve: The MR vena contracta is 0.6 cm. Moderate to severe mitral valve regurgitation. No evidence of mitral valve stenosis. Tricuspid Valve: The tricuspid valve is abnormal. Tricuspid valve regurgitation is moderate . No evidence of tricuspid stenosis. Aortic Valve: The aortic valve is tricuspid. Aortic valve regurgitation is moderate. Aortic regurgitation PHT measures 384 msec. Pulmonic Valve: The pulmonic valve was not well visualized. Pulmonic valve regurgitation is not visualized. No evidence of pulmonic stenosis. Aorta: The aortic root and ascending aorta are structurally normal, with no evidence of dilitation. Venous:  The inferior vena cava is dilated in size with  less than 50% respiratory variability, suggesting right atrial pressure of 15 mmHg. Additional Comments: Spectral Doppler performed. Color Doppler performed.  LEFT VENTRICLE PLAX 2D LVIDd:         5.50 cm LVIDs:         3.70 cm LV PW:         1.60 cm LV IVS:        1.60 cm LVOT diam:     2.30 cm LVOT Area:     4.15 cm  LV Volumes (MOD) LV vol d, MOD A2C: 81.4 ml LV vol d, MOD A4C: 109.2 ml LV vol s, MOD A2C: 27.2 ml LV vol s, MOD A4C: 60.7 ml LV SV MOD A2C:     54.2 ml LV SV MOD A4C:     109.2 ml LV SV MOD BP:      52.7 ml IVC IVC diam: 2.40 cm AORTIC VALVE AI PHT:      384 msec  AORTA Ao Root diam: 3.00 cm Ao Asc diam:  3.40 cm MR Peak grad:    114.1 mmHg   TRICUSPID VALVE MR Mean grad:    78.0 mmHg    TR Peak grad:   44.1 mmHg MR Vmax:         534.00 cm/s  TR Vmax:        332.00 cm/s MR Vmean:        413.0 cm/s MR PISA:         2.26 cm     SHUNTS MR PISA Eff ROA: 16 mm       Systemic Diam: 2.30 cm MR PISA Radius:  0.60 cm Armida Lander MD Electronically signed by Armida Lander MD Signature Date/Time: 04/15/2024/3:57:47 PM    Final    DG Chest Port 1 View Result Date: 04/15/2024 CLINICAL DATA:  Chest pain, shortness of breath EXAM: PORTABLE CHEST - 1 VIEW COMPARISON:  03/31/2024 FINDINGS: Mild interstitial and alveolar opacities involving bases more than apices, right greater than left, increased from previous. No pneumothorax. Mild cardiomegaly.  Ectatic thoracic aorta. No effusion. Sternotomy wires. IMPRESSION: Mild interstitial and alveolar opacities, right greater than left. Electronically Signed   By: Nicoletta Barrier M.D.   On: 04/15/2024 12:57   CT Chest Wo Contrast Result Date: 04/15/2024 CLINICAL DATA:  Chest pain, shortness of breath EXAM: CT CHEST WITHOUT CONTRAST TECHNIQUE: Multidetector CT imaging of the chest was performed following the standard protocol without IV contrast. RADIATION DOSE REDUCTION: This exam was performed according to the  departmental dose-optimization program which includes automated exposure control, adjustment of the mA and/or kV according to patient size and/or use of iterative reconstruction technique. COMPARISON:  03/31/2024 FINDINGS: Cardiovascular: Mild cardiomegaly with left-sided enlargement. Small pericardial effusion stable. Post ascending aorta repair with displaced intimal calcifications in the distal arch implying persistent dissection flap, as was noted on all postop studies back to 07/24/2021. estimated 4.3 cm dilatation of the proximal descending thoracic aorta, grossly stable by my measurement. Mediastinum/Nodes: No acute mediastinal hematoma, mass, or adenopathy. Lungs/Pleura: Trace pleural fluid right greater than left as before. No pneumothorax. Subpleural scarring and septal lines peripherally at the lung bases, right greater than left. Lungs otherwise clear. Upper Abdomen: Progressive left renal atrophy and cystic change since 12/02/2020. Displaced intimal calcifications in the abdominal aorta. No acute findings. Musculoskeletal: Sternotomy wires. IMPRESSION: 1. No acute findings. 2. Post ascending aorta repair with persistent dissection flap in the distal arch. 3. 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 4. Progressive left renal  atrophy and cystic change since 12/02/2020. 5.  Aortic Atherosclerosis (ICD10-I70.0). Electronically Signed   By: Nicoletta Barrier M.D.   On: 04/15/2024 12:56    Cardiac Studies   Echo  Apr 15, 2024   1. Left ventricular ejection fraction, by estimation, is 60 to 65%. The  left ventricle has normal function. The left ventricle has no regional  wall motion abnormalities. There is severe left ventricular hypertrophy.   2. Right ventricular systolic function is normal. The right ventricular  size is normal. There is moderately elevated pulmonary artery systolic  pressure. The estimated right ventricular systolic pressure is 59.1 mmHg.   3. The MR vena contracta is 0.6  cm. . Moderate to severe mitral valve  regurgitation. No evidence of mitral stenosis.   4. The tricuspid valve is abnormal. Tricuspid valve regurgitation is  moderate.   5. The aortic valve is tricuspid. Aortic valve regurgitation is moderate.   6. The inferior vena cava is dilated in size with <50% respiratory  variability, suggesting right atrial pressure of 15 mmHg.   Patient Profile      Mary Stephens is a 67 y.o. female with a hx of aortic dissection, poorly controlled HTN and CKD who is being seen today for the evaluation of chest pain at the request of Dr. Adriana Hopping.   Assessment & Plan    1  CP   Pt deneis   Feeling much better    Trop neg x 2 yesterday   CT (without contrast) showed asc repair with persistent flap in distal arch   Stable descending aorta V/Q scan today negative for PE    Echo this admit showed signficiant MR   ? If this is cause of chest pressure Plan for TEE tomorrow to evaluate valve further   2 Hx  Type A Aortic dissection   s/p repair in 2021     3  Mitral regurgitation   Echo this admit MR is mod to severe   Will plan of TEE to evaluate further   4  HTN  BP is labile, still high  Will increase amlodipine  to 5 bid  5  REnal  Cr 4.51 today   Was  2.78 on 03/31/24   Was 2.45 in May 2024   FOllow   Avoid toxic drugs  Control BP     Informed Consent   Shared Decision Making/Informed Consent   The risks [esophageal damage, perforation (1:10,000 risk), bleeding, pharyngeal hematoma as well as other potential complications associated with conscious sedation including aspiration, arrhythmia, respiratory failure and death], benefits (treatment guidance and diagnostic support) and alternatives of a transesophageal echocardiogram were discussed in detail with Mary Stephens and she is willing to proceed.      For questions or updates, please contact [facility] Please consult [email or web address] for contact info under        Signed, Ola Berger, MD  04/16/2024,  9:47 AM

## 2024-04-17 ENCOUNTER — Inpatient Hospital Stay (HOSPITAL_COMMUNITY): Payer: Medicare (Managed Care) | Admitting: Anesthesiology

## 2024-04-17 ENCOUNTER — Encounter (HOSPITAL_COMMUNITY)
Admission: EM | Disposition: A | Discharge status: Home / Self Care | Payer: Self-pay | Source: Home / Self Care | Attending: Family Medicine

## 2024-04-17 ENCOUNTER — Inpatient Hospital Stay (HOSPITAL_COMMUNITY): Payer: Medicare (Managed Care)

## 2024-04-17 DIAGNOSIS — I10 Essential (primary) hypertension: Secondary | ICD-10-CM | POA: Diagnosis not present

## 2024-04-17 DIAGNOSIS — I340 Nonrheumatic mitral (valve) insufficiency: Secondary | ICD-10-CM

## 2024-04-17 DIAGNOSIS — N184 Chronic kidney disease, stage 4 (severe): Secondary | ICD-10-CM | POA: Diagnosis not present

## 2024-04-17 DIAGNOSIS — Z95828 Presence of other vascular implants and grafts: Secondary | ICD-10-CM | POA: Diagnosis not present

## 2024-04-17 DIAGNOSIS — E119 Type 2 diabetes mellitus without complications: Secondary | ICD-10-CM

## 2024-04-17 DIAGNOSIS — Z538 Procedure and treatment not carried out for other reasons: Secondary | ICD-10-CM | POA: Diagnosis not present

## 2024-04-17 DIAGNOSIS — I300 Acute nonspecific idiopathic pericarditis: Secondary | ICD-10-CM | POA: Diagnosis not present

## 2024-04-17 HISTORY — PX: TRANSESOPHAGEAL ECHOCARDIOGRAM (CATH LAB): EP1270

## 2024-04-17 LAB — GLUCOSE, CAPILLARY
Glucose-Capillary: 113 mg/dL — ABNORMAL HIGH (ref 70–99)
Glucose-Capillary: 123 mg/dL — ABNORMAL HIGH (ref 70–99)
Glucose-Capillary: 129 mg/dL — ABNORMAL HIGH (ref 70–99)
Glucose-Capillary: 132 mg/dL — ABNORMAL HIGH (ref 70–99)
Glucose-Capillary: 96 mg/dL (ref 70–99)

## 2024-04-17 LAB — CBC
HCT: 32.1 % — ABNORMAL LOW (ref 36.0–46.0)
Hemoglobin: 9.8 g/dL — ABNORMAL LOW (ref 12.0–15.0)
MCH: 32.1 pg (ref 26.0–34.0)
MCHC: 30.5 g/dL (ref 30.0–36.0)
MCV: 105.2 fL — ABNORMAL HIGH (ref 80.0–100.0)
Platelets: 196 10*3/uL (ref 150–400)
RBC: 3.05 MIL/uL — ABNORMAL LOW (ref 3.87–5.11)
RDW: 13.2 % (ref 11.5–15.5)
WBC: 9.4 10*3/uL (ref 4.0–10.5)
nRBC: 0 % (ref 0.0–0.2)

## 2024-04-17 LAB — BASIC METABOLIC PANEL WITH GFR
Anion gap: 10 (ref 5–15)
BUN: 76 mg/dL — ABNORMAL HIGH (ref 8–23)
CO2: 18 mmol/L — ABNORMAL LOW (ref 22–32)
Calcium: 9.2 mg/dL (ref 8.9–10.3)
Chloride: 112 mmol/L — ABNORMAL HIGH (ref 98–111)
Creatinine, Ser: 4.5 mg/dL — ABNORMAL HIGH (ref 0.44–1.00)
GFR, Estimated: 10 mL/min — ABNORMAL LOW (ref >60)
Glucose, Bld: 125 mg/dL — ABNORMAL HIGH (ref 70–99)
Potassium: 5.2 mmol/L — ABNORMAL HIGH (ref 3.5–5.1)
Sodium: 140 mmol/L (ref 135–145)

## 2024-04-17 SURGERY — TRANSESOPHAGEAL ECHOCARDIOGRAM (TEE) (CATHLAB)
Anesthesia: Monitor Anesthesia Care

## 2024-04-17 MED ORDER — PREDNISONE 20 MG PO TABS
30.0000 mg | ORAL_TABLET | Freq: Every day | ORAL | Status: DC
  Administered 2024-04-18: 30 mg via ORAL
  Filled 2024-04-17 (×2): qty 1

## 2024-04-17 MED ORDER — SODIUM CHLORIDE 0.9% FLUSH
3.0000 mL | Freq: Two times a day (BID) | INTRAVENOUS | Status: DC
  Administered 2024-04-17: 3 mL via INTRAVENOUS
  Administered 2024-04-17: 10 mL via INTRAVENOUS

## 2024-04-17 MED ORDER — SODIUM CHLORIDE 0.9 % IV SOLN: INTRAVENOUS | Status: DC | PRN

## 2024-04-17 MED ORDER — ONDANSETRON HCL 4 MG/2ML IJ SOLN
INTRAMUSCULAR | Status: DC | PRN
  Administered 2024-04-17: 4 mg via INTRAVENOUS

## 2024-04-17 MED ORDER — PROPOFOL 500 MG/50ML IV EMUL
INTRAVENOUS | Status: DC | PRN
  Administered 2024-04-17: 125 ug/kg/min via INTRAVENOUS

## 2024-04-17 MED ORDER — LIDOCAINE 2% (20 MG/ML) 5 ML SYRINGE
INTRAMUSCULAR | Status: DC | PRN
  Administered 2024-04-17: 100 mg via INTRAVENOUS

## 2024-04-17 MED ORDER — HYDROCORTISONE (PERIANAL) 2.5 % EX CREA
1.0000 | TOPICAL_CREAM | Freq: Four times a day (QID) | CUTANEOUS | Status: DC | PRN
  Filled 2024-04-17: qty 28.35

## 2024-04-17 MED ORDER — PROPOFOL 10 MG/ML IV BOLUS
INTRAVENOUS | Status: DC | PRN
  Administered 2024-04-17 (×4): 30 mg via INTRAVENOUS

## 2024-04-17 MED ORDER — SODIUM CHLORIDE 0.9% FLUSH: 3.0000 mL | INTRAVENOUS | Status: DC | PRN

## 2024-04-17 NOTE — Progress Notes (Signed)
 Echocardiogram Echocardiogram Transesophageal is incomplete.   Mary Stephens 04/17/2024, 12:02 PM

## 2024-04-17 NOTE — Interval H&P Note (Signed)
 History and Physical Interval Note:  04/17/2024 9:40 AM  Mary Stephens  has presented today for surgery, with the diagnosis of MR.  The various methods of treatment have been discussed with the patient and family. After consideration of risks, benefits and other options for treatment, the patient has consented to  Procedure(s): TRANSESOPHAGEAL ECHOCARDIOGRAM (N/A) as a surgical intervention.  The patient's history has been reviewed, patient examined, no change in status, stable for surgery.  I have reviewed the patient's chart and labs.  Questions were answered to the patient's satisfaction.     Gaylyn Keas

## 2024-04-17 NOTE — CV Procedure (Signed)
     PROCEDURE NOTE:  Procedure:  Transesophageal echocardiogram Operator:  Gaylyn Keas, MD Indications:  Severe Mitral Regurgitation Complications: None  During this procedure the patient is administered a total of Propofol  305 mg and Lidocaine  100 mg to achieve and maintain moderate conscious sedation.  The patient's heart rate, blood pressure, and oxygen saturation are monitored continuously during the procedure. The period of conscious sedation is 20 minutes, of which I was present face-to-face 100% of this time. MIchele McMichael CRNA is an independent, trained observer who assisted in the monitoring of the patient's level of consciousness.    Multiple attempts were made to intubate the TEE probe unsuccessfully.  A pediatric probe was then tried again without success due to ongoing resistance at 20cm.  The procedure was aborted.  The patient tolerated the procedure well and was transferred back to their room in stable condition.  Signed: Gaylyn Keas, MD [facility]

## 2024-04-17 NOTE — Plan of Care (Signed)
   Problem: Education: Goal: Ability to describe self-care measures that may prevent or decrease complications (Diabetes Survival Skills Education) will improve Outcome: Progressing Goal: Individualized Educational Video(s) Outcome: Progressing

## 2024-04-17 NOTE — Progress Notes (Signed)
 Progress Note   Patient: Mary Stephens HYQ:[id number] DOB: [birth year] DOA: 04/15/2024     2 DOS: the patient was seen and examined on 04/17/2024   Brief hospital course: Mary Stephens is a 68 y.o. female with medical history significant of  T2DM, HTN, HLD, ongoing tobacco use, hep C, CKD stage IV, and history of aortic dissection with repair presents with new onset chest pain. She has hypoxia upon presentation. D-dimer elevated. CT showed a small pericardial effusion as well as a 4.3 cm dilation of the proximal descending thoracic aorta which was thought to be grossly stable. Cardiology consulted for possible pericarditis admitted to TRH service for further management.  Assessment and Plan: Chest pain Echo showed elevated pulmonary artery systolic pressures, high RSVP, moderate to severe MR. No ST-T wave changes that are suggestive of this. Solumedrol tapered to prednisone  for 3 days due to concern for pericarditis. Cardiology recommended TEE for which she is being transported to [facility].  Mitral valve regurgitation Moderate to severe based on today's echo. Cardiology advised - TEE to evaluate mitral valve further. She is being transported to [facility] for TEE scheduled today.  Hypoxic respiratory failure (HCC) Patient is a smoker but does not have a diagnosis of COPD.  I do not hear significant wheezing on exam.   D-dimer is high. V/Q not available at [facility]. Perfusion scan performed 04/16/24 negative for PE. BNP very high, CT did not show significant fluid in the lungs but she did have a small right pleural effusion. Continue duoneb inhalers Continue supplemental oxygen to keep sats greater than 93%. Will follow cardiology recommendations.  Anemia due to chronic kidney disease Patient had a normal ferritin and iron studies during her last hospitalization.  Her MCV is elevated. B12 and folate with in normal limits.  HLD (hyperlipidemia) Continue Lipitor 10 mg daily  Acute on CKD  (chronic kidney disease) stage 4, GFR 15-29 ml/min (HCC) Creatinine is 3.74 which worsened to 4.5 this admission. Outpatient nephrology follow-up Avoid nephrotoxic agents Patient also had extensive workup during previous hospitalization which showed chronic left renal artery occlusion.  CT continues to show progressive left renal atrophy. Trend daily renal function.  S/P ascending aortic replacement Status post type A repair, stable dilation per radiology. Per cardiology, could be a increased dissection with mitral valve involvement.  This is not consistent with a type that require emergent surgical repair and will be treated medically with good blood pressure control.  Essential hypertension Resumed her amlodipine , Coreg , and hydralazine .  Tobacco dependence Smoking cessation advised.  DM (diabetes mellitus), type 2 (HCC) Carb consistent diet. Accucheks, diabetes diet, sliding scale insulin   Chronic hepatitis C virus infection (HCC) LFT normal. Outpatient follow-up    Out of bed to chair. Incentive spirometry. Nursing supportive care. Fall, aspiration precautions. Diet:  Diet Orders (From admission, onward)     Start     Ordered   04/17/24 1304  Diet heart healthy/carb modified Room service appropriate? Yes; Fluid consistency: Thin  Diet effective now       Question Answer Comment  Diet-HS Snack? Nothing   Room service appropriate? Yes   Fluid consistency: Thin      04/17/24 1303           DVT prophylaxis: SCDs Start: 04/15/24 1608  Level of care: Telemetry   Code Status: Full Code  Subjective: Patient is seen and examined today morning before she is being transferred for TEE. She is lying in the bed. States her  chest pain, shortness of breath improved. Denies palpitations.  Physical Exam: Vitals:   04/17/24 1155 04/17/24 1200 04/17/24 1210 04/17/24 1220  BP: 129/81 128/82 (!) 142/87 (!) 146/98  Pulse: 70 70 70 66  Resp: 16 19 (!) 23 15  Temp: 97.6 F (36.4  C)  97.6 F (36.4 C) 97.7 F (36.5 C)  TempSrc: Temporal  Temporal Temporal  SpO2: 98%  97% 94%  Weight:      Height:        General - Elderly African American female, no apparent distress HEENT - PERRLA, EOMI, atraumatic head, non tender sinuses. Lung - Clear, basal rales, rhonchi, no wheezes. Heart - S1, S2 heard, no murmurs, rubs, trace pedal edema. Abdomen - Soft, non tender, bowel sounds good Neuro - Alert, awake and oriented x 3, non focal exam. Skin - Warm and dry.  Data Reviewed:      Latest Ref Rng & Units 04/17/2024    4:37 AM 04/16/2024    3:48 AM 04/15/2024   11:59 AM  CBC  WBC 4.0 - 10.5 K/uL 9.4  4.3  6.7   Hemoglobin 12.0 - 15.0 g/dL 9.8  16.1  09.6   Hematocrit 36.0 - 46.0 % 32.1  32.5  34.0   Platelets 150 - 400 K/uL 196  204  227       Latest Ref Rng & Units 04/17/2024    4:37 AM 04/16/2024    3:48 AM 04/15/2024   11:59 AM  BMP  Glucose 70 - 99 mg/dL 045  409  811   BUN 8 - 23 mg/dL 76  57  51   Creatinine 0.44 - 1.00 mg/dL 9.14  7.82  9.56   Sodium 135 - 145 mmol/L 140  142  142   Potassium 3.5 - 5.1 mmol/L 5.2  4.9  4.5   Chloride 98 - 111 mmol/L 112  113  113   CO2 22 - 32 mmol/L 18  20  19    Calcium  8.9 - 10.3 mg/dL 9.2  9.2  9.1    EP STUDY Result Date: 04/17/2024 See surgical note for result.  ABORTED INVASIVE LAB PROCEDURE Result Date: 04/17/2024 See surgical note for result.  NM Pulmonary Perfusion Result Date: 04/16/2024 CLINICAL DATA:  Concern for pulmonary embolism.  Hypoxemia EXAM: NUCLEAR MEDICINE PERFUSION LUNG SCAN TECHNIQUE: Perfusion images were obtained in multiple projections after intravenous injection of radiopharmaceutical. RADIOPHARMACEUTICALS:  4.3 mCi Tc-5m MAA COMPARISON:  Chest radiograph 04/15/2024 FINDINGS: No wedge-shaped peripheral perfusion defect within LEFT or RIGHT lung to suggest acute pulmonary embolism. Normal perfusion pattern. IMPRESSION: No evidence acute pulmonary embolism. Electronically Signed   By: Deboraha Fallow  M.D.   On: 04/16/2024 11:04   ECHOCARDIOGRAM LIMITED Result Date: 04/15/2024    ECHOCARDIOGRAM LIMITED REPORT   Patient Name:   Mary Stephens Date of Exam: 04/15/2024 Medical Rec #:  [id number]      Height:       68.0 in Accession #:    [id number]     Weight:       132.3 lb Date of Birth:  01/30/1956      BSA:          1.714 m Patient Age:    68 years       BP:           160/100 mmHg Patient Gender: F              HR:  64 bpm. Exam Location:  Inpatient Procedure: Limited Echo, Cardiac Doppler and Color Doppler (Both Spectral and            Color Flow Doppler were utilized during procedure). Indications:    R07.9* Chest pain, unspecified; R06.02 SOB  History:        Patient has prior history of Echocardiogram examinations, most                 recent 03/14/2024. Aortic Valve Disease, Signs/Symptoms:Dyspnea                 and Shortness of Breath; Risk Factors:Current Smoker,                 Dyslipidemia, Hypertension and Diabetes. Ascending aorta                 replacement.  Sonographer:    Raynelle Callow RDCS Referring Phys: [id number] [facility]  Sonographer Comments: Technically difficult study due to poor echo windows. IMPRESSIONS  1. Left ventricular ejection fraction, by estimation, is 60 to 65%. The left ventricle has normal function. The left ventricle has no regional wall motion abnormalities. There is severe left ventricular hypertrophy.  2. Right ventricular systolic function is normal. The right ventricular size is normal. There is moderately elevated pulmonary artery systolic pressure. The estimated right ventricular systolic pressure is 59.1 mmHg.  3. The MR vena contracta is 0.6 cm. . Moderate to severe mitral valve regurgitation. No evidence of mitral stenosis.  4. The tricuspid valve is abnormal. Tricuspid valve regurgitation is moderate.  5. The aortic valve is tricuspid. Aortic valve regurgitation is moderate.  6. The inferior vena cava is dilated in size with <50% respiratory variability,  suggesting right atrial pressure of 15 mmHg. FINDINGS  Left Ventricle: Left ventricular ejection fraction, by estimation, is 60 to 65%. The left ventricle has normal function. The left ventricle has no regional wall motion abnormalities. There is severe left ventricular hypertrophy. Right Ventricle: The right ventricular size is normal. Right vetricular wall thickness was not well visualized. Right ventricular systolic function is normal. There is moderately elevated pulmonary artery systolic pressure. The tricuspid regurgitant velocity is 3.32 m/s, and with an assumed right atrial pressure of 15 mmHg, the estimated right ventricular systolic pressure is 59.1 mmHg. Mitral Valve: The MR vena contracta is 0.6 cm. Moderate to severe mitral valve regurgitation. No evidence of mitral valve stenosis. Tricuspid Valve: The tricuspid valve is abnormal. Tricuspid valve regurgitation is moderate . No evidence of tricuspid stenosis. Aortic Valve: The aortic valve is tricuspid. Aortic valve regurgitation is moderate. Aortic regurgitation PHT measures 384 msec. Pulmonic Valve: The pulmonic valve was not well visualized. Pulmonic valve regurgitation is not visualized. No evidence of pulmonic stenosis. Aorta: The aortic root and ascending aorta are structurally normal, with no evidence of dilitation. Venous: The inferior vena cava is dilated in size with less than 50% respiratory variability, suggesting right atrial pressure of 15 mmHg. Additional Comments: Spectral Doppler performed. Color Doppler performed.  LEFT VENTRICLE PLAX 2D LVIDd:         5.50 cm LVIDs:         3.70 cm LV PW:         1.60 cm LV IVS:        1.60 cm LVOT diam:     2.30 cm LVOT Area:     4.15 cm  LV Volumes (MOD) LV vol d, MOD A2C: 81.4 ml LV vol d, MOD A4C: 109.2 ml  LV vol s, MOD A2C: 27.2 ml LV vol s, MOD A4C: 60.7 ml LV SV MOD A2C:     54.2 ml LV SV MOD A4C:     109.2 ml LV SV MOD BP:      52.7 ml IVC IVC diam: 2.40 cm AORTIC VALVE AI PHT:      384 msec   AORTA Ao Root diam: 3.00 cm Ao Asc diam:  3.40 cm MR Peak grad:    114.1 mmHg   TRICUSPID VALVE MR Mean grad:    78.0 mmHg    TR Peak grad:   44.1 mmHg MR Vmax:         534.00 cm/s  TR Vmax:        332.00 cm/s MR Vmean:        413.0 cm/s MR PISA:         2.26 cm     SHUNTS MR PISA Eff ROA: 16 mm       Systemic Diam: 2.30 cm MR PISA Radius:  0.60 cm Mary Lander MD Electronically signed by Mary Lander MD Signature Date/Time: 04/15/2024/3:57:47 PM    Final     Family Communication: Discussed with patient, understand and agree. All questions answered.  Disposition: Status is: Inpatient Remains inpatient appropriate because: TEE, cardiology work up.  Planned Discharge Destination: Home with Home Health     Time spent: 39 minutes  Author: Aisha Hove, MD 04/17/2024 1:03 PM Secure chat 7am to 7pm For on call review [email or web address].

## 2024-04-17 NOTE — Plan of Care (Signed)
  Problem: Education: Goal: Ability to describe self-care measures that may prevent or decrease complications (Diabetes Survival Skills Education) will improve Outcome: Progressing Goal: Individualized Educational Video(s) Outcome: Progressing   Problem: Coping: Goal: Ability to adjust to condition or change in health will improve Outcome: Progressing   Problem: Health Behavior/Discharge Planning: Goal: Ability to identify and utilize available resources and services will improve Outcome: Progressing

## 2024-04-17 NOTE — Progress Notes (Addendum)
 Rounding Note    Patient Name: Mary Stephens Date of Encounter: 04/17/2024  [facility] Cardiologist: Dr. Lavonne Prairie  Subjective   Occasionally has some chest pain when carrying heavy groceries up to the third floor, symptom resolved now since she moved to the first floor. No significant SOB.   Inpatient Medications    Scheduled Meds:  amLODipine   5 mg Oral BID   aspirin  EC  325 mg Oral Daily   atorvastatin   10 mg Oral q1800   carvedilol   25 mg Oral BID   cholecalciferol  5,000 Units Oral Daily   hydrALAZINE   100 mg Oral TID   insulin  aspart  0-9 Units Subcutaneous Q4H   predniSONE   30 mg Oral Q breakfast   Continuous Infusions:  PRN Meds: acetaminophen , ipratropium-albuterol, melatonin, metoprolol  tartrate, morphine  injection, nicotine , polyethylene glycol   Vital Signs    Vitals:   04/15/24 2322 04/16/24 0441 04/16/24 1305 04/17/24 0427  BP: (!) 136/97 (!) 153/95 (!) 150/94 132/80  Pulse: 69 72 83 76  Resp: 18 18 20 16   Temp: 98 F (36.7 C) 97.7 F (36.5 C) 97.9 F (36.6 C) 99 F (37.2 C)  TempSrc: Oral Oral Oral Oral  SpO2: 98% 95% 96% 99%  Weight:      Height:        Intake/Output Summary (Last 24 hours) at 04/17/2024 0845 Last data filed at 04/16/2024 1100 Gross per 24 hour  Intake 0 ml  Output --  Net 0 ml      04/15/2024   11:13 AM 04/03/2024    3:31 AM 04/02/2024    5:00 AM  Last 3 Weights  Weight (lbs) 132 lb 4.4 oz 134 lb 4.8 oz 134 lb 8 oz  Weight (kg) 60 kg 60.918 kg 61.009 kg      Telemetry    NSR without significant ventricular ectopy - Personally Reviewed  ECG    NSR with LVH - Personally Reviewed  Physical Exam   GEN: No acute distress.   Neck: No JVD Cardiac: RRR, no murmurs, rubs, or gallops.  Respiratory: Clear to auscultation bilaterally. GI: Soft, nontender, non-distended  MS: No edema; No deformity. Neuro:  Nonfocal  Psych: Normal affect   Labs    High Sensitivity Troponin:   Recent Labs  Lab  03/31/24 0428 03/31/24 0603 03/31/24 1607 04/15/24 1159 04/15/24 1423  TROPONINIHS 37* 127* 487* 34* 31*     Chemistry Recent Labs  Lab 04/15/24 1159 04/16/24 0348 04/17/24 0437  NA 142 142 140  K 4.5 4.9 5.2*  CL 113* 113* 112*  CO2 19* 20* 18*  GLUCOSE 107* 150* 125*  Stephens 51* 57* 76*  CREATININE 3.74* 4.51* 4.50*  CALCIUM  9.1 9.2 9.2  PROT  --  7.1  --   ALBUMIN   --  3.6  --   AST  --  15  --   ALT  --  13  --   ALKPHOS  --  62  --   BILITOT  --  0.8  --   GFRNONAA 12* 10* 10*  ANIONGAP 10 9 10     Lipids No results for input(s): "CHOL", "TRIG", "HDL", "LABVLDL", "LDLCALC", "CHOLHDL" in the last 168 hours.  Hematology Recent Labs  Lab 04/15/24 1159 04/16/24 0348 04/17/24 0437  WBC 6.7 4.3 9.4  RBC 3.31* 3.07* 3.05*  HGB 10.8* 10.0* 9.8*  HCT 34.0* 32.5* 32.1*  MCV 102.7* 105.9* 105.2*  MCH 32.6 32.6 32.1  MCHC 31.8 30.8 30.5  RDW  13.2 13.0 13.2  PLT 227 204 196   Thyroid No results for input(s): "TSH", "FREET4" in the last 168 hours.  BNP Recent Labs  Lab 04/15/24 1159  BNP 3,207.9*    DDimer  Recent Labs  Lab 04/15/24 1550  DDIMER 1.17*     Radiology    NM Pulmonary Perfusion Result Date: 04/16/2024 CLINICAL DATA:  Concern for pulmonary embolism.  Hypoxemia EXAM: NUCLEAR MEDICINE PERFUSION LUNG SCAN TECHNIQUE: Perfusion images were obtained in multiple projections after intravenous injection of radiopharmaceutical. RADIOPHARMACEUTICALS:  4.3 mCi Tc-78m MAA COMPARISON:  Chest radiograph 04/15/2024 FINDINGS: No wedge-shaped peripheral perfusion defect within LEFT or RIGHT lung to suggest acute pulmonary embolism. Normal perfusion pattern. IMPRESSION: No evidence acute pulmonary embolism. Electronically Signed   By: Deboraha Fallow M.D.   On: 04/16/2024 11:04   ECHOCARDIOGRAM LIMITED Result Date: 04/15/2024    ECHOCARDIOGRAM LIMITED REPORT   Patient Name:   Mary Stephens Date of Exam: 04/15/2024 Medical Rec #:  [id number]      Height:       68.0 in  Accession #:    [id number]     Weight:       132.3 lb Date of Birth:  1956/05/11      BSA:          1.714 m Patient Age:    68 years       BP:           160/100 mmHg Patient Gender: F              HR:           64 bpm. Exam Location:  Inpatient Procedure: Limited Echo, Cardiac Doppler and Color Doppler (Both Spectral and            Color Flow Doppler were utilized during procedure). Indications:    R07.9* Chest pain, unspecified; R06.02 SOB  History:        Patient has prior history of Echocardiogram examinations, most                 recent 03/14/2024. Aortic Valve Disease, Signs/Symptoms:Dyspnea                 and Shortness of Breath; Risk Factors:Current Smoker,                 Dyslipidemia, Hypertension and Diabetes. Ascending aorta                 replacement.  Sonographer:    Raynelle Callow RDCS Referring Phys: [id number] [facility]  Sonographer Comments: Technically difficult study due to poor echo windows. IMPRESSIONS  1. Left ventricular ejection fraction, by estimation, is 60 to 65%. The left ventricle has normal function. The left ventricle has no regional wall motion abnormalities. There is severe left ventricular hypertrophy.  2. Right ventricular systolic function is normal. The right ventricular size is normal. There is moderately elevated pulmonary artery systolic pressure. The estimated right ventricular systolic pressure is 59.1 mmHg.  3. The MR vena contracta is 0.6 cm. . Moderate to severe mitral valve regurgitation. No evidence of mitral stenosis.  4. The tricuspid valve is abnormal. Tricuspid valve regurgitation is moderate.  5. The aortic valve is tricuspid. Aortic valve regurgitation is moderate.  6. The inferior vena cava is dilated in size with <50% respiratory variability, suggesting right atrial pressure of 15 mmHg. FINDINGS  Left Ventricle: Left ventricular ejection fraction, by estimation, is 60 to 65%. The left ventricle has normal  function. The left ventricle has no regional wall  motion abnormalities. There is severe left ventricular hypertrophy. Right Ventricle: The right ventricular size is normal. Right vetricular wall thickness was not well visualized. Right ventricular systolic function is normal. There is moderately elevated pulmonary artery systolic pressure. The tricuspid regurgitant velocity is 3.32 m/s, and with an assumed right atrial pressure of 15 mmHg, the estimated right ventricular systolic pressure is 59.1 mmHg. Mitral Valve: The MR vena contracta is 0.6 cm. Moderate to severe mitral valve regurgitation. No evidence of mitral valve stenosis. Tricuspid Valve: The tricuspid valve is abnormal. Tricuspid valve regurgitation is moderate . No evidence of tricuspid stenosis. Aortic Valve: The aortic valve is tricuspid. Aortic valve regurgitation is moderate. Aortic regurgitation PHT measures 384 msec. Pulmonic Valve: The pulmonic valve was not well visualized. Pulmonic valve regurgitation is not visualized. No evidence of pulmonic stenosis. Aorta: The aortic root and ascending aorta are structurally normal, with no evidence of dilitation. Venous: The inferior vena cava is dilated in size with less than 50% respiratory variability, suggesting right atrial pressure of 15 mmHg. Additional Comments: Spectral Doppler performed. Color Doppler performed.  LEFT VENTRICLE PLAX 2D LVIDd:         5.50 cm LVIDs:         3.70 cm LV PW:         1.60 cm LV IVS:        1.60 cm LVOT diam:     2.30 cm LVOT Area:     4.15 cm  LV Volumes (MOD) LV vol d, MOD A2C: 81.4 ml LV vol d, MOD A4C: 109.2 ml LV vol s, MOD A2C: 27.2 ml LV vol s, MOD A4C: 60.7 ml LV SV MOD A2C:     54.2 ml LV SV MOD A4C:     109.2 ml LV SV MOD BP:      52.7 ml IVC IVC diam: 2.40 cm AORTIC VALVE AI PHT:      384 msec  AORTA Ao Root diam: 3.00 cm Ao Asc diam:  3.40 cm MR Peak grad:    114.1 mmHg   TRICUSPID VALVE MR Mean grad:    78.0 mmHg    TR Peak grad:   44.1 mmHg MR Vmax:         534.00 cm/s  TR Vmax:        332.00 cm/s MR  Vmean:        413.0 cm/s MR PISA:         2.26 cm     SHUNTS MR PISA Eff ROA: 16 mm       Systemic Diam: 2.30 cm MR PISA Radius:  0.60 cm Armida Lander MD Electronically signed by Armida Lander MD Signature Date/Time: 04/15/2024/3:57:47 PM    Final    DG Chest Port 1 View Result Date: 04/15/2024 CLINICAL DATA:  Chest pain, shortness of breath EXAM: PORTABLE CHEST - 1 VIEW COMPARISON:  03/31/2024 FINDINGS: Mild interstitial and alveolar opacities involving bases more than apices, right greater than left, increased from previous. No pneumothorax. Mild cardiomegaly.  Ectatic thoracic aorta. No effusion. Sternotomy wires. IMPRESSION: Mild interstitial and alveolar opacities, right greater than left. Electronically Signed   By: Nicoletta Barrier M.D.   On: 04/15/2024 12:57   CT Chest Wo Contrast Result Date: 04/15/2024 CLINICAL DATA:  Chest pain, shortness of breath EXAM: CT CHEST WITHOUT CONTRAST TECHNIQUE: Multidetector CT imaging of the chest was performed following the standard protocol without IV contrast. RADIATION DOSE REDUCTION: This exam  was performed according to the departmental dose-optimization program which includes automated exposure control, adjustment of the mA and/or kV according to patient size and/or use of iterative reconstruction technique. COMPARISON:  03/31/2024 FINDINGS: Cardiovascular: Mild cardiomegaly with left-sided enlargement. Small pericardial effusion stable. Post ascending aorta repair with displaced intimal calcifications in the distal arch implying persistent dissection flap, as was noted on all postop studies back to 07/24/2021. estimated 4.3 cm dilatation of the proximal descending thoracic aorta, grossly stable by my measurement. Mediastinum/Nodes: No acute mediastinal hematoma, mass, or adenopathy. Lungs/Pleura: Trace pleural fluid right greater than left as before. No pneumothorax. Subpleural scarring and septal lines peripherally at the lung bases, right greater than left. Lungs  otherwise clear. Upper Abdomen: Progressive left renal atrophy and cystic change since 12/02/2020. Displaced intimal calcifications in the abdominal aorta. No acute findings. Musculoskeletal: Sternotomy wires. IMPRESSION: 1. No acute findings. 2. Post ascending aorta repair with persistent dissection flap in the distal arch. 3. 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 4. Progressive left renal atrophy and cystic change since 12/02/2020. 5.  Aortic Atherosclerosis (ICD10-I70.0). Electronically Signed   By: Nicoletta Barrier M.D.   On: 04/15/2024 12:56    Cardiac Studies   Echo 04/15/2024  1. Left ventricular ejection fraction, by estimation, is 60 to 65%. The  left ventricle has normal function. The left ventricle has no regional  wall motion abnormalities. There is severe left ventricular hypertrophy.   2. Right ventricular systolic function is normal. The right ventricular  size is normal. There is moderately elevated pulmonary artery systolic  pressure. The estimated right ventricular systolic pressure is 59.1 mmHg.   3. The MR vena contracta is 0.6 cm. . Moderate to severe mitral valve  regurgitation. No evidence of mitral stenosis.   4. The tricuspid valve is abnormal. Tricuspid valve regurgitation is  moderate.   5. The aortic valve is tricuspid. Aortic valve regurgitation is moderate.   6. The inferior vena cava is dilated in size with <50% respiratory  variability, suggesting right atrial pressure of 15 mmHg.   Patient Profile     68 y.o. female with PMH of aortic dissection, poorly controlled HTN and CKD who presented with chest discomfort.   Assessment & Plan    Chest pain  - VQ scan 04/16/2024 negative for PE  - describe chest pain before when trying to carry heavy groceries to the third floor, symptom resolved after she moved to the first floor. Able to everyday activity without chest pain, only gets chest discomfort with more strenuous activity.   - with poor renal function,  will continue medical therapy.   Type A aortic dissection s/p repair 2021: need to make sure blood pressure is under better control.   Moderate to severe mitral regurgitation: pending TEE today - Echo 04/15/2024 EF 60-65%, RVSP 59.1 mmHg, moderate to severe MR, moderate AI.   - risk and benefit of TEE including 1:10000 chance of bradycardia, hypotension, aspiration event, acute respiratory failure, esophageal bleeding, hematoma or ruptured discussed with the patient who is willing to proceed.   Hypertension: amlodipine  5mg  added. SBP came down to 130s  Elevated BNP: euvolemic on exam  CKD stage V: Cr 4.50. Still making urine. She says she does not want dialysis.       For questions or updates, please contact [facility] Please consult [email or web address] for contact info under        Signed, Ervin Heath, PA  04/17/2024, 8:45 AM    Pt now at  [facility] for TEE Reviewed findings noted by Rosetta Cons above    Agree with recommendations    Further recommendations will depend on TEE findings   Ola Berger, MD

## 2024-04-17 NOTE — TOC Transition Note (Signed)
 Transition of Care [facility]) - Discharge Note   Patient Details  Name: Mary Stephens MRN: [id number] Date of Birth: 1955/12/31  Transition of Care [facility] CM/SW Contact:  Gertha Ku, LCSW Phone Number: 04/17/2024, 1:09 PM   Clinical Narrative:    CSW spoke with the P's [facility] case manager, Mylinda Asa [phone]), to inquire about the pt's expected discharge needs. CSW explained that there is currently no expected discharge date, but will follow up if any needs arise. TCO to follow.     Barriers to Discharge: Continued Medical Work up   Patient Goals and CMS Choice            Discharge Placement                       Discharge Plan and Services Additional resources added to the After Visit Summary for                                       Social Drivers of Health (SDOH) Interventions SDOH Screenings   Food Insecurity: Food Insecurity Present (04/15/2024)  Housing: High Risk (04/17/2024)  Transportation Needs: No Transportation Needs (04/15/2024)  Utilities: Not At Risk (04/15/2024)  Social Connections: Socially Isolated (04/15/2024)  Tobacco Use: High Risk (04/15/2024)     Readmission Risk Interventions     No data to display

## 2024-04-17 NOTE — Anesthesia Preprocedure Evaluation (Signed)
 Anesthesia Evaluation  Patient identified by MRN, date of birth, ID band Patient awake    Reviewed: Allergy & Precautions, NPO status , Patient's Chart, lab work & pertinent test results  Airway Mallampati: II  TM Distance: >3 FB Neck ROM: Full    Dental no notable dental hx. (+) Dental Advisory Given   Pulmonary Current Smoker   Pulmonary exam normal breath sounds clear to auscultation       Cardiovascular hypertension, pulmonary hypertension+ Valvular Problems/Murmurs (TR mod) MR and AI  Rhythm:Regular Rate:Normal + Systolic murmurs Echo 04/15/2024  1. Left ventricular ejection fraction, by estimation, is 60 to 65%. The left ventricle has normal function. The left ventricle has no regional wall motion abnormalities. There is severe left ventricular hypertrophy.   2. Right ventricular systolic function is normal. The right ventricular size is normal. There is moderately elevated pulmonary artery systolic pressure. The estimated right ventricular systolic pressure is 59.1 mmHg.   3. The MR vena contracta is 0.6 cm. Moderate to severe mitral valve regurgitation. No evidence of mitral stenosis.   4. The tricuspid valve is abnormal. Tricuspid valve regurgitation is moderate.   5. The aortic valve is tricuspid. Aortic valve regurgitation is moderate.   6. The inferior vena cava is dilated in size with <50% respiratory variability, suggesting right atrial pressure of 15 mmHg.     Neuro/Psych  PSYCHIATRIC DISORDERS Anxiety Depression    negative neurological ROS     GI/Hepatic ,GERD  ,,(+) Hepatitis -, C  Endo/Other  diabetes    Renal/GU Renal InsufficiencyRenal disease     Musculoskeletal  (+) Arthritis ,    Abdominal   Peds  Hematology  (+) Blood dyscrasia, anemia   Anesthesia Other Findings   Reproductive/Obstetrics                             Anesthesia Physical Anesthesia Plan  ASA:  4  Anesthesia Plan: MAC   Post-op Pain Management: Minimal or no pain anticipated   Induction: Intravenous  PONV Risk Score and Plan: 1 and Propofol  infusion, TIVA and Treatment may vary due to age or medical condition  Airway Management Planned: Natural Airway  Additional Equipment:   Intra-op Plan:   Post-operative Plan:   Informed Consent: I have reviewed the patients History and Physical, chart, labs and discussed the procedure including the risks, benefits and alternatives for the proposed anesthesia with the patient or authorized representative who has indicated his/her understanding and acceptance.     Dental advisory given  Plan Discussed with: CRNA  Anesthesia Plan Comments:        Anesthesia Quick Evaluation

## 2024-04-17 NOTE — Transfer of Care (Signed)
 Immediate Anesthesia Transfer of Care Note  Patient: Mary Stephens  Procedure(s) Performed: TRANSESOPHAGEAL ECHOCARDIOGRAM INVASIVE LAB ABORTED CASE (Left)  Patient Location: PACU and Cath Lab  Anesthesia Type:General  Level of Consciousness: drowsy  Airway & Oxygen Therapy: Patient Spontanous Breathing and Patient connected to nasal cannula oxygen  Post-op Assessment: Report given to RN and Post -op Vital signs reviewed and stable  Post vital signs: Reviewed and stable  Last Vitals:  Vitals Value Taken Time  BP    Temp    Pulse 71 04/17/24 1158  Resp 18 04/17/24 1158  SpO2 99 % 04/17/24 1158  Vitals shown include unfiled device data.  Last Pain:  Vitals:   04/17/24 1114  TempSrc:   PainSc: 0-No pain      Patients Stated Pain Goal: 0 (04/16/24 2046)  Complications: No notable events documented.

## 2024-04-18 ENCOUNTER — Other Ambulatory Visit: Payer: Self-pay | Admitting: Physician Assistant

## 2024-04-18 ENCOUNTER — Encounter (HOSPITAL_COMMUNITY): Payer: Self-pay | Admitting: Cardiology

## 2024-04-18 DIAGNOSIS — N184 Chronic kidney disease, stage 4 (severe): Secondary | ICD-10-CM | POA: Diagnosis not present

## 2024-04-18 DIAGNOSIS — F17200 Nicotine dependence, unspecified, uncomplicated: Secondary | ICD-10-CM | POA: Diagnosis not present

## 2024-04-18 DIAGNOSIS — I340 Nonrheumatic mitral (valve) insufficiency: Secondary | ICD-10-CM

## 2024-04-18 DIAGNOSIS — J9601 Acute respiratory failure with hypoxia: Secondary | ICD-10-CM | POA: Diagnosis not present

## 2024-04-18 DIAGNOSIS — R0789 Other chest pain: Secondary | ICD-10-CM

## 2024-04-18 DIAGNOSIS — R0602 Shortness of breath: Secondary | ICD-10-CM

## 2024-04-18 LAB — BASIC METABOLIC PANEL WITH GFR
Anion gap: 9 (ref 5–15)
BUN: 85 mg/dL — ABNORMAL HIGH (ref 8–23)
CO2: 18 mmol/L — ABNORMAL LOW (ref 22–32)
Calcium: 8.6 mg/dL — ABNORMAL LOW (ref 8.9–10.3)
Chloride: 107 mmol/L (ref 98–111)
Creatinine, Ser: 4.5 mg/dL — ABNORMAL HIGH (ref 0.44–1.00)
GFR, Estimated: 10 mL/min — ABNORMAL LOW (ref >60)
Glucose, Bld: 112 mg/dL — ABNORMAL HIGH (ref 70–99)
Potassium: 4.9 mmol/L (ref 3.5–5.1)
Sodium: 134 mmol/L — ABNORMAL LOW (ref 135–145)

## 2024-04-18 LAB — GLUCOSE, CAPILLARY
Glucose-Capillary: 139 mg/dL — ABNORMAL HIGH (ref 70–99)
Glucose-Capillary: 108 mg/dL — ABNORMAL HIGH (ref 70–99)
Glucose-Capillary: 104 mg/dL — ABNORMAL HIGH (ref 70–99)
Glucose-Capillary: 126 mg/dL — ABNORMAL HIGH (ref 70–99)

## 2024-04-18 LAB — CBC
HCT: 29.9 % — ABNORMAL LOW (ref 36.0–46.0)
Hemoglobin: 9.3 g/dL — ABNORMAL LOW (ref 12.0–15.0)
MCH: 32.5 pg (ref 26.0–34.0)
MCHC: 31.1 g/dL (ref 30.0–36.0)
MCV: 104.5 fL — ABNORMAL HIGH (ref 80.0–100.0)
Platelets: 188 10*3/uL (ref 150–400)
RBC: 2.86 MIL/uL — ABNORMAL LOW (ref 3.87–5.11)
RDW: 12.9 % (ref 11.5–15.5)
WBC: 7.7 10*3/uL (ref 4.0–10.5)
nRBC: 0 % (ref 0.0–0.2)

## 2024-04-18 NOTE — Anesthesia Postprocedure Evaluation (Signed)
 Anesthesia Post Note  Patient: Mary Stephens  Procedure(s) Performed: TRANSESOPHAGEAL ECHOCARDIOGRAM INVASIVE LAB ABORTED CASE (Left)     Patient location during evaluation: PACU Anesthesia Type: MAC Level of consciousness: awake and alert Pain management: pain level controlled Vital Signs Assessment: post-procedure vital signs reviewed and stable Respiratory status: spontaneous breathing Cardiovascular status: stable Anesthetic complications: no   No notable events documented.  Last Vitals:  Vitals:   04/17/24 2057 04/18/24 0434  BP: (!) 146/85 123/77  Pulse: 76 72  Resp: 18 19  Temp: 37 C 36.8 C  SpO2: 97% 96%    Last Pain:  Vitals:   04/18/24 0800  TempSrc:   PainSc: 0-No pain                 Gorman Laughter

## 2024-04-18 NOTE — Progress Notes (Addendum)
 Rounding Note    Patient Name: Mary Stephens Date of Encounter: 04/18/2024  [facility] Cardiologist: None   Subjective   Denies any CP or SOB last night.   Inpatient Medications    Scheduled Meds:  amLODipine   5 mg Oral BID   aspirin  EC  325 mg Oral Daily   atorvastatin   10 mg Oral q1800   carvedilol   25 mg Oral BID   cholecalciferol  5,000 Units Oral Daily   hydrALAZINE   100 mg Oral TID   insulin  aspart  0-9 Units Subcutaneous Q4H   predniSONE   30 mg Oral Q breakfast   sodium chloride  flush  3-10 mL Intravenous Q12H   Continuous Infusions:  PRN Meds: acetaminophen , hydrocortisone , ipratropium-albuterol, melatonin, metoprolol  tartrate, morphine  injection, nicotine , polyethylene glycol, sodium chloride  flush   Vital Signs    Vitals:   04/17/24 1424 04/17/24 1446 04/17/24 2057 04/18/24 0434  BP:   (!) 146/85 123/77  Pulse:   76 72  Resp:  20 18 19   Temp:   98.6 F (37 C) 98.3 F (36.8 C)  TempSrc:   Oral Oral  SpO2: 95%  97% 96%  Weight:      Height:        Intake/Output Summary (Last 24 hours) at 04/18/2024 0736 Last data filed at 04/18/2024 0600 Gross per 24 hour  Intake 730 ml  Output --  Net 730 ml      04/15/2024   11:13 AM 04/03/2024    3:31 AM 04/02/2024    5:00 AM  Last 3 Weights  Weight (lbs) 132 lb 4.4 oz 134 lb 4.8 oz 134 lb 8 oz  Weight (kg) 60 kg 60.918 kg 61.009 kg      Telemetry    NSR without significant ventricular ectopy - Personally Reviewed  ECG    NSR without significant ST-T wave changes - Personally Reviewed  Physical Exam   GEN: No acute distress.   Neck: No JVD Cardiac: RRR, no murmurs, rubs, or gallops.  Respiratory: Clear to auscultation bilaterally. GI: Soft, nontender, non-distended  MS: No edema; No deformity. Neuro:  Nonfocal  Psych: Normal affect   Labs    High Sensitivity Troponin:   Recent Labs  Lab 03/31/24 0428 03/31/24 0603 03/31/24 1607 04/15/24 1159 04/15/24 1423  TROPONINIHS 37*  127* 487* 34* 31*     Chemistry Recent Labs  Lab 04/16/24 0348 04/17/24 0437 04/18/24 0430  NA 142 140 134*  K 4.9 5.2* 4.9  CL 113* 112* 107  CO2 20* 18* 18*  GLUCOSE 150* 125* 112*  BUN 57* 76* 85*  CREATININE 4.51* 4.50* 4.50*  CALCIUM  9.2 9.2 8.6*  PROT 7.1  --   --   ALBUMIN  3.6  --   --   AST 15  --   --   ALT 13  --   --   ALKPHOS 62  --   --   BILITOT 0.8  --   --   GFRNONAA 10* 10* 10*  ANIONGAP 9 10 9     Lipids No results for input(s): "CHOL", "TRIG", "HDL", "LABVLDL", "LDLCALC", "CHOLHDL" in the last 168 hours.  Hematology Recent Labs  Lab 04/16/24 0348 04/17/24 0437 04/18/24 0430  WBC 4.3 9.4 7.7  RBC 3.07* 3.05* 2.86*  HGB 10.0* 9.8* 9.3*  HCT 32.5* 32.1* 29.9*  MCV 105.9* 105.2* 104.5*  MCH 32.6 32.1 32.5  MCHC 30.8 30.5 31.1  RDW 13.0 13.2 12.9  PLT 204 196 188   Thyroid  No results for input(s): "TSH", "FREET4" in the last 168 hours.  BNP Recent Labs  Lab 04/15/24 1159  BNP 3,207.9*    DDimer  Recent Labs  Lab 04/15/24 1550  DDIMER 1.17*     Radiology    EP STUDY Result Date: 04/17/2024 See surgical note for result.  ABORTED INVASIVE LAB PROCEDURE Result Date: 04/17/2024 See surgical note for result.  NM Pulmonary Perfusion Result Date: 04/16/2024 CLINICAL DATA:  Concern for pulmonary embolism.  Hypoxemia EXAM: NUCLEAR MEDICINE PERFUSION LUNG SCAN TECHNIQUE: Perfusion images were obtained in multiple projections after intravenous injection of radiopharmaceutical. RADIOPHARMACEUTICALS:  4.3 mCi Tc-52m MAA COMPARISON:  Chest radiograph 04/15/2024 FINDINGS: No wedge-shaped peripheral perfusion defect within LEFT or RIGHT lung to suggest acute pulmonary embolism. Normal perfusion pattern. IMPRESSION: No evidence acute pulmonary embolism. Electronically Signed   By: Deboraha Fallow M.D.   On: 04/16/2024 11:04    Cardiac Studies   Echo 04/15/2024  1. Left ventricular ejection fraction, by estimation, is 60 to 65%. The  left ventricle has  normal function. The left ventricle has no regional  wall motion abnormalities. There is severe left ventricular hypertrophy.   2. Right ventricular systolic function is normal. The right ventricular  size is normal. There is moderately elevated pulmonary artery systolic  pressure. The estimated right ventricular systolic pressure is 59.1 mmHg.   3. The MR vena contracta is 0.6 cm. . Moderate to severe mitral valve  regurgitation. No evidence of mitral stenosis.   4. The tricuspid valve is abnormal. Tricuspid valve regurgitation is  moderate.   5. The aortic valve is tricuspid. Aortic valve regurgitation is moderate.   6. The inferior vena cava is dilated in size with <50% respiratory  variability, suggesting right atrial pressure of 15 mmHg.   Patient Profile     68 y.o. female with PMH of aortic dissection, poorly controlled HTN and CKD who presented with chest discomfort.   Assessment & Plan    Chest pain             - VQ scan 04/16/2024 negative for PE             - describe chest pain before when trying to carry heavy groceries to the third floor, symptom resolved after she moved to the first floor. Able to everyday activity without chest pain, only gets chest discomfort with more strenuous activity.              - with poor renal function, will continue medical therapy.    Type A aortic dissection s/p repair 2021: need to make sure blood pressure is under better control.    Moderate to severe mitral regurgitation:  - Echo 04/15/2024 EF 60-65%, RVSP 59.1 mmHg, moderate to severe MR, moderate AI.              - TEE on 04/17/2024 was aborted after unsuccessfully pass the transesophageal ultrasound probe multiple times (including the use of a pediatric probe)   - not a candidate for SGLT2 inhibitor given GFR>20. She appears to be compensated and not in acute CHF. Need close outpatient follow up nephrology service.    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 Still making urine. She says she does not want dialysis.       For questions or updates, please contact [facility] Please consult [email or web address] for contact info under  Signed, Ervin Heath, PA  04/18/2024, 7:36 AM    PT seen and examined   Agree with findings as noted by Rosetta Cons above  Pt denies CP  No SOB   Has been walking in halls  Lungs are CTA Cardiac RRR  I-II/VI systolic murmur apex Abd is benign Ext are without edema  Chest pressure / SOB    It may be that pt had episode of diastolic dysfunciton in setting of MR and HTN where she got severely SOB     Improved     BP is better today  GOod   PT needs a limited dedicated echo to look at MV again.   With other  medical issues particularly renal function, I am not sure she is a candidate for intervention without risking further renal deterioration  WIll arrange for outpt follow up   Has appt in cardiology   Will get echo Due to see Dr Deloise Ferries in next month as well   Ola Berger MD

## 2024-04-18 NOTE — Progress Notes (Signed)
 AVS reviewed w/ pt who verbalized an understanding. No other questions at this time. PIV removed as noted. Central tele notified of pt discharge to home by this RN, tele removed. Pt's ride will be here at 38- daughter is at work until 1800.

## 2024-04-18 NOTE — Discharge Summary (Signed)
 Physician Discharge Summary   Patient: Mary Stephens MRN: [id number] DOB: 18-Jun-1956  Admit date:     04/15/2024  Discharge date: {dischdate:26783}  Discharge Physician: Aisha Hove   PCP: System, Provider Not In   Recommendations at discharge:  {Tip this will not be part of the note when signed- Example include specific recommendations for outpatient follow-up, pending tests to follow-up on. (Optional):26781}  ***  Discharge Diagnoses: Principal Problem:   Pericarditis Active Problems:   Chronic hepatitis C virus infection (HCC)   DM (diabetes mellitus), type 2 (HCC)   Tobacco dependence   Essential hypertension   S/P ascending aortic replacement   CKD (chronic kidney disease) stage 4, GFR 15-29 ml/min (HCC)   HLD (hyperlipidemia)   Anemia due to chronic kidney disease   Hypoxic respiratory failure (HCC)   Mitral valve regurgitation   SOB (shortness of breath)  Resolved Problems:   * No resolved hospital problems. [facility] Course: No notes on file  Assessment and Plan: * Pericarditis Presentation symptoms align with this although not sure she should be this hypoxic. Also does not have any ST-T wave changes that are suggestive of this. Cardiology has been consulted Given Solu-Medrol 125 mg in the ED.  Will begin 0.5 mg/kg prednisone  dosing for now.  Mitral valve regurgitation Moderate to severe based on today's echo. I have reached out to cardiology for assessment this was noted to be mild on prior echo approximately 2 weeks ago. Have suggested possible TEE later.  Hypoxic respiratory failure (HCC) Patient is a smoker but does not have a diagnosis of COPD.  I do not hear significant wheezing on exam.   D-dimer is high. Will check VQ scan. Heparin  GTT if PE is suspected on VQ scan.  Holding for now due to possibility of aortic dissection. Will check BNP CT did not show significant fluid in the lungs but she did have a small right pleural effusion. Add  inhalers Oxygen to keep sats greater than 93%.   Anemia due to chronic kidney disease Suspected. Patient had a normal ferritin and iron studies during her last hospitalization.  Her MCV is elevated. Check B12 and folate.  HLD (hyperlipidemia) Low-cholesterol diet Patient was not on a statin prior to admission. Appears the patient previously been on atorvastatin  10 mg from 2016 at least until 2024. Will resume Lipitor 10 mg daily    CKD (chronic kidney disease) stage 4, GFR 15-29 ml/min (HCC) Creatinine is 3.74 which appears stable from 2 weeks ago. Outpatient nephrology follow-up Avoid nephrotoxic agents Patient also had extensive workup during previous hospitalization which showed chronic left renal artery occlusion.  CT continues to show progressive left renal atrophy. Trend  S/P ascending aortic replacement Status post type A repair Little bit larger dilation which appears to be stable per radiology. Question if this could be a subacute dissection.  The patient was stable from a blood pressure standpoint and does have a high D-dimer Per cardiology, could be a increased dissection with mitral valve involvement.  This is not consistent with a type a require emergent surgical repair and will be treated medically with good blood pressure control.  Essential hypertension Patient reports taking her meds daily.  She is unsure if she is taking them correctly. Will resume her amlodipine , Coreg , and hydralazine .  Tobacco dependence Patient was offered and was unsure about the need for nicotine  replacement. The patch has been ordered for her as needed.  DM (diabetes mellitus), type 2 (HCC) On diet usually  Given steroid dosing will follow CBGs, diabetes diet, sliding scale insulin   Chronic hepatitis C virus infection (HCC) Monitor LFTs Outpatient follow-up      {Tip this will not be part of the note when signed Body mass index is 20.11 kg/m. , ,  (Optional):26781}  {(NOTE)  Pain control PDMP Statment (Optional):26782} Consultants: *** Procedures performed: ***  Disposition: {Plan; Disposition:26390} Diet recommendation:  Discharge Diet Orders (From admission, onward)     Start     Ordered   04/18/24 0000  Diet - low sodium heart healthy        04/18/24 1452           {Diet_Plan:26776} DISCHARGE MEDICATION: Allergies as of 04/18/2024   No Known Allergies      Medication List     STOP taking these medications    methocarbamol  500 MG tablet Commonly known as: ROBAXIN    metoprolol  tartrate 50 MG tablet Commonly known as: LOPRESSOR    spironolactone 25 MG tablet Commonly known as: ALDACTONE       TAKE these medications    acetaminophen  500 MG tablet Commonly known as: TYLENOL  Take 500-1,000 mg by mouth every 8 (eight) hours as needed (for pain).   amLODipine  10 MG tablet Commonly known as: NORVASC  Take 1 tablet (10 mg total) by mouth daily.   aspirin  EC 325 MG tablet Take 1 tablet (325 mg total) by mouth daily.   atorvastatin  40 MG tablet Commonly known as: LIPITOR Take 40 mg by mouth at bedtime.   carvedilol  25 MG tablet Commonly known as: COREG  Take 1 tablet (25 mg total) by mouth 2 (two) times daily with a meal.   hydrALAZINE  100 MG tablet Commonly known as: APRESOLINE  Take 1 tablet (100 mg total) by mouth 3 (three) times daily.   nicotine  21 mg/24hr patch Commonly known as: NICODERM CQ  - dosed in mg/24 hours Place 21 mg onto the skin daily as needed (for smoking cessation).        Follow-up Information     Ervin Heath, [place] Follow up on 05/10/2024.   Specialties: Cardiology, Radiology Why: 5th floor. @1 :55PM. Cardiology follow up Contact information: [address] [place] [address] [phone]                Discharge Exam: Cleavon Curls Weights   04/15/24 1113  Weight: 60 kg   ***  Condition at discharge: {DC Condition:26389}  The results of significant diagnostics from this hospitalization  (including imaging, microbiology, ancillary and laboratory) are listed below for reference.   Imaging Studies: EP STUDY Result Date: 04/17/2024 See surgical note for result.  ABORTED INVASIVE LAB PROCEDURE Result Date: 04/17/2024 See surgical note for result.  NM Pulmonary Perfusion Result Date: 04/16/2024 CLINICAL DATA:  Concern for pulmonary embolism.  Hypoxemia EXAM: NUCLEAR MEDICINE PERFUSION LUNG SCAN TECHNIQUE: Perfusion images were obtained in multiple projections after intravenous injection of radiopharmaceutical. RADIOPHARMACEUTICALS:  4.3 mCi Tc-32m MAA COMPARISON:  Chest radiograph 04/15/2024 FINDINGS: No wedge-shaped peripheral perfusion defect within LEFT or RIGHT lung to suggest acute pulmonary embolism. Normal perfusion pattern. IMPRESSION: No evidence acute pulmonary embolism. Electronically Signed   By: Deboraha Fallow M.D.   On: 04/16/2024 11:04   ECHOCARDIOGRAM LIMITED Result Date: 04/15/2024    ECHOCARDIOGRAM LIMITED REPORT   Patient Name:   CONSUELA LORRAINE Date of Exam: 04/15/2024 Medical Rec #:  [id number]      Height:       68.0 in Accession #:    [id number]     Weight:  132.3 lb Date of Birth:  [birth year]      BSA:          1.714 m Patient Age:    68 years       BP:           160/100 mmHg Patient Gender: F              HR:           64 bpm. Exam Location:  Inpatient Procedure: Limited Echo, Cardiac Doppler and Color Doppler (Both Spectral and            Color Flow Doppler were utilized during procedure). Indications:    R07.9* Chest pain, unspecified; R06.02 SOB  History:        Patient has prior history of Echocardiogram examinations, most                 recent 03/14/2024. Aortic Valve Disease, Signs/Symptoms:Dyspnea                 and Shortness of Breath; Risk Factors:Current Smoker,                 Dyslipidemia, Hypertension and Diabetes. Ascending aorta                 replacement.  Sonographer:    Raynelle Callow RDCS Referring Phys: [id number] [facility]  Sonographer Comments:  Technically difficult study due to poor echo windows. IMPRESSIONS  1. Left ventricular ejection fraction, by estimation, is 60 to 65%. The left ventricle has normal function. The left ventricle has no regional wall motion abnormalities. There is severe left ventricular hypertrophy.  2. Right ventricular systolic function is normal. The right ventricular size is normal. There is moderately elevated pulmonary artery systolic pressure. The estimated right ventricular systolic pressure is 59.1 mmHg.  3. The MR vena contracta is 0.6 cm. . Moderate to severe mitral valve regurgitation. No evidence of mitral stenosis.  4. The tricuspid valve is abnormal. Tricuspid valve regurgitation is moderate.  5. The aortic valve is tricuspid. Aortic valve regurgitation is moderate.  6. The inferior vena cava is dilated in size with <50% respiratory variability, suggesting right atrial pressure of 15 mmHg. FINDINGS  Left Ventricle: Left ventricular ejection fraction, by estimation, is 60 to 65%. The left ventricle has normal function. The left ventricle has no regional wall motion abnormalities. There is severe left ventricular hypertrophy. Right Ventricle: The right ventricular size is normal. Right vetricular wall thickness was not well visualized. Right ventricular systolic function is normal. There is moderately elevated pulmonary artery systolic pressure. The tricuspid regurgitant velocity is 3.32 m/s, and with an assumed right atrial pressure of 15 mmHg, the estimated right ventricular systolic pressure is 59.1 mmHg. Mitral Valve: The MR vena contracta is 0.6 cm. Moderate to severe mitral valve regurgitation. No evidence of mitral valve stenosis. Tricuspid Valve: The tricuspid valve is abnormal. Tricuspid valve regurgitation is moderate . No evidence of tricuspid stenosis. Aortic Valve: The aortic valve is tricuspid. Aortic valve regurgitation is moderate. Aortic regurgitation PHT measures 384 msec. Pulmonic Valve: The pulmonic  valve was not well visualized. Pulmonic valve regurgitation is not visualized. No evidence of pulmonic stenosis. Aorta: The aortic root and ascending aorta are structurally normal, with no evidence of dilitation. Venous: The inferior vena cava is dilated in size with less than 50% respiratory variability, suggesting right atrial pressure of 15 mmHg. Additional Comments: Spectral Doppler performed. Color Doppler performed.  LEFT VENTRICLE PLAX 2D LVIDd:  5.50 cm LVIDs:         3.70 cm LV PW:         1.60 cm LV IVS:        1.60 cm LVOT diam:     2.30 cm LVOT Area:     4.15 cm  LV Volumes (MOD) LV vol d, MOD A2C: 81.4 ml LV vol d, MOD A4C: 109.2 ml LV vol s, MOD A2C: 27.2 ml LV vol s, MOD A4C: 60.7 ml LV SV MOD A2C:     54.2 ml LV SV MOD A4C:     109.2 ml LV SV MOD BP:      52.7 ml IVC IVC diam: 2.40 cm AORTIC VALVE AI PHT:      384 msec  AORTA Ao Root diam: 3.00 cm Ao Asc diam:  3.40 cm MR Peak grad:    114.1 mmHg   TRICUSPID VALVE MR Mean grad:    78.0 mmHg    TR Peak grad:   44.1 mmHg MR Vmax:         534.00 cm/s  TR Vmax:        332.00 cm/s MR Vmean:        413.0 cm/s MR PISA:         2.26 cm     SHUNTS MR PISA Eff ROA: 16 mm       Systemic Diam: 2.30 cm MR PISA Radius:  0.60 cm Armida Lander MD Electronically signed by Armida Lander MD Signature Date/Time: 04/15/2024/3:57:47 PM    Final    DG Chest Port 1 View Result Date: 04/15/2024 CLINICAL DATA:  Chest pain, shortness of breath EXAM: PORTABLE CHEST - 1 VIEW COMPARISON:  03/31/2024 FINDINGS: Mild interstitial and alveolar opacities involving bases more than apices, right greater than left, increased from previous. No pneumothorax. Mild cardiomegaly.  Ectatic thoracic aorta. No effusion. Sternotomy wires. IMPRESSION: Mild interstitial and alveolar opacities, right greater than left. Electronically Signed   By: Nicoletta Barrier M.D.   On: 04/15/2024 12:57   CT Chest Wo Contrast Result Date: 04/15/2024 CLINICAL DATA:  Chest pain, shortness of breath EXAM:  CT CHEST WITHOUT CONTRAST TECHNIQUE: Multidetector CT imaging of the chest was performed following the standard protocol without IV contrast. RADIATION DOSE REDUCTION: This exam was performed according to the departmental dose-optimization program which includes automated exposure control, adjustment of the mA and/or kV according to patient size and/or use of iterative reconstruction technique. COMPARISON:  03/31/2024 FINDINGS: Cardiovascular: Mild cardiomegaly with left-sided enlargement. Small pericardial effusion stable. Post ascending aorta repair with displaced intimal calcifications in the distal arch implying persistent dissection flap, as was noted on all postop studies back to 07/24/2021. estimated 4.3 cm dilatation of the proximal descending thoracic aorta, grossly stable by my measurement. Mediastinum/Nodes: No acute mediastinal hematoma, mass, or adenopathy. Lungs/Pleura: Trace pleural fluid right greater than left as before. No pneumothorax. Subpleural scarring and septal lines peripherally at the lung bases, right greater than left. Lungs otherwise clear. Upper Abdomen: Progressive left renal atrophy and cystic change since 12/02/2020. Displaced intimal calcifications in the abdominal aorta. No acute findings. Musculoskeletal: Sternotomy wires. IMPRESSION: 1. No acute findings. 2. Post ascending aorta repair with persistent dissection flap in the distal arch. 3. 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 4. Progressive left renal atrophy and cystic change since 12/02/2020. 5.  Aortic Atherosclerosis (ICD10-I70.0). Electronically Signed   By: Nicoletta Barrier M.D.   On: 04/15/2024 12:56   ECHOCARDIOGRAM COMPLETE Result Date: 04/02/2024  ECHOCARDIOGRAM REPORT   Patient Name:   HAWWA WESTBY Date of Exam: 04/02/2024 Medical Rec #:  [id number]      Height:       68.0 in Accession #:    [id number]     Weight:       134.5 lb Date of Birth:  May 06, 1956      BSA:          1.727 m Patient Age:    68 years        BP:           141/94 mmHg Patient Gender: F              HR:           79 bpm. Exam Location:  Inpatient Procedure: 2D Echo, Color Doppler and Cardiac Doppler (Both Spectral and Color            Flow Doppler were utilized during procedure). Indications:    CHF I50.9  History:        Patient has prior history of Echocardiogram examinations, most                 recent 12/02/2020. Aortic Dissection repair 2021; Risk                 Factors:Diabetes, Dyslipidemia and Hypertension.  Sonographer:    Hersey Lorenzo RDCS Referring Phys: [id number] [facility] IMPRESSIONS  1. Left ventricular ejection fraction, by estimation, is 55%. The left ventricle has normal function. The left ventricle has no regional wall motion abnormalities. There is moderate concentric left ventricular hypertrophy. Left ventricular diastolic parameters are consistent with Grade I diastolic dysfunction (impaired relaxation).  2. Right ventricular systolic function is normal. The right ventricular size is normal. Tricuspid regurgitation signal is inadequate for assessing PA pressure.  3. Left atrial size was severely dilated.  4. The mitral valve is normal in structure. Mild mitral valve regurgitation. No evidence of mitral stenosis.  5. The aortic valve is tricuspid. There is mild calcification of the aortic valve. Aortic valve regurgitation is mild. No aortic stenosis is present.  6. The inferior vena cava is normal in size with greater than 50% respiratory variability, suggesting right atrial pressure of 3 mmHg.  7. A small pericardial effusion is present.  8. Possible residual dissection visualized in the abdominal aorta. FINDINGS  Left Ventricle: Left ventricular ejection fraction, by estimation, is 55%. The left ventricle has normal function. The left ventricle has no regional wall motion abnormalities. The left ventricular internal cavity size was normal in size. There is moderate concentric left ventricular hypertrophy. Left ventricular  diastolic parameters are consistent with Grade I diastolic dysfunction (impaired relaxation). Right Ventricle: The right ventricular size is normal. No increase in right ventricular wall thickness. Right ventricular systolic function is normal. Tricuspid regurgitation signal is inadequate for assessing PA pressure. Left Atrium: Left atrial size was severely dilated. Right Atrium: Right atrial size was normal in size. Pericardium: A small pericardial effusion is present. Mitral Valve: The mitral valve is normal in structure. Mild mitral valve regurgitation. No evidence of mitral valve stenosis. Tricuspid Valve: The tricuspid valve is normal in structure. Tricuspid valve regurgitation is trivial. Aortic Valve: The aortic valve is tricuspid. There is mild calcification of the aortic valve. Aortic valve regurgitation is mild. Aortic regurgitation PHT measures 962 msec. No aortic stenosis is present. Pulmonic Valve: The pulmonic valve was normal in structure. Pulmonic valve regurgitation is not visualized. Aorta: The aortic root is  normal in size and structure. Venous: The inferior vena cava is normal in size with greater than 50% respiratory variability, suggesting right atrial pressure of 3 mmHg. IAS/Shunts: No atrial level shunt detected by color flow Doppler.  LEFT VENTRICLE PLAX 2D LVIDd:         5.10 cm   Diastology LVIDs:         3.20 cm   LV e' medial:    3.59 cm/s LV PW:         1.70 cm   LV E/e' medial:  16.0 LV IVS:        1.70 cm   LV e' lateral:   3.48 cm/s LVOT diam:     2.10 cm   LV E/e' lateral: 16.5 LV SV:         69 LV SV Index:   40 LVOT Area:     3.46 cm  RIGHT VENTRICLE             IVC RV S prime:     11.50 cm/s  IVC diam: 1.40 cm TAPSE (M-mode): 1.3 cm LEFT ATRIUM              Index        RIGHT ATRIUM           Index LA diam:        3.70 cm  2.14 cm/m   RA Area:     15.30 cm LA Vol (A2C):   105.0 ml 60.81 ml/m  RA Volume:   37.80 ml  21.89 ml/m LA Vol (A4C):   93.0 ml  53.86 ml/m LA Biplane  Vol: 109.0 ml 63.13 ml/m  AORTIC VALVE LVOT Vmax:   93.30 cm/s LVOT Vmean:  59.800 cm/s LVOT VTI:    0.200 m AI PHT:      962 msec  AORTA Ao Root diam: 3.40 cm Ao Asc diam:  2.90 cm MITRAL VALVE MV Area (PHT): 5.31 cm    SHUNTS MV Decel Time: 143 msec    Systemic VTI:  0.20 m MV E velocity: 57.40 cm/s  Systemic Diam: 2.10 cm MV A velocity: 67.70 cm/s MV E/A ratio:  0.85 Dalton McleanMD Electronically signed by Archer Bear Signature Date/Time: 04/02/2024/9:21:39 AM    Final    US  RENAL Result Date: 04/01/2024 CLINICAL DATA:  [id number] AKI (acute kidney injury) (HCC) [id number] EXAM: RENAL / URINARY TRACT ULTRASOUND COMPLETE COMPARISON:  05/06/2023. FINDINGS: The right kidney measured 10.1 cm and the left kidney measured 7.1 cm. The kidneys demonstrate increased echogenicity consistent with chronic medical renal disease. Midpole cyst on the left measures 2.4 cm. Upper pole cyst on the right measures 1.5 cm. No follow up recommended for the cysts. No shadowing stones are seen. No hydronephrosis identified. Bladder: Appears normal for degree of bladder distention. IMPRESSION: Echogenic kidneys consistent with chronic medical renal disease. No acute abnormalities. Electronically Signed   By: Sydell Eva M.D.   On: 04/01/2024 14:41   CT Chest Wo Contrast Result Date: 03/31/2024 CLINICAL DATA:  Shortness breath, chest pain and diaphoresis. EXAM: CT CHEST WITHOUT CONTRAST TECHNIQUE: Multidetector CT imaging of the chest was performed following the standard protocol without IV contrast. RADIATION DOSE REDUCTION: This exam was performed according to the departmental dose-optimization program which includes automated exposure control, adjustment of the mA and/or kV according to patient size and/or use of iterative reconstruction technique. COMPARISON:  Portable chest from today, chest CTs without contrast 12/09/2023 and 11/15/2022. FINDINGS: Cardiovascular: There is mild cardiomegaly, small  pericardial effusion  interval increased now 7 mm thick. There are three-vessel coronary calcifications greatest in the circumflex coronary artery. Chronic enlargement of the pulmonary trunk indicating arterial hypertension and measuring 3.6 cm. There is prominence of the central pulmonary veins more than previously. 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 This extends from the sinotubular junction to the ascending aortic/arch junction. The the proximal arch distal to the graft today measures 4.3 cm on 3:68, previously 4.2 cm. Dilatation in the aortic isthmus is 4.9 cm craniocaudal on 6:94, increased from the last CT when this measured 4.2 cm. The descending aorta measures 3.9 cm AP on 6:102, stable. The distal descending aorta is 3.7 cm on 6:87, also stable. Mediastinum/Nodes: There is a 1.5 cm heterogeneous solid nodule posteriorly in the lower pole of the right thyroid lobe. Nonemergent follow-up ultrasound recommended. Axillary spaces are clear. No intrathoracic adenopathy is seen without contrast. There is a chronically patulous esophagus with a normal wall thickness. The trachea and main bronchi are patent. Lungs/Pleura: There are bilateral small layering pleural effusions. There is interlobular septal edema in the lung apices and bases consistent with CHF or fluid overload. There is diffuse bronchial thickening which could be congestive and/or bronchitic. There are bilateral ground-glass opacities with relative peripheral sparing showing a gravity dependent as well as a basal gradient. This is probably ground-glass edema but underlying pneumonia would be difficult to exclude. Upper Abdomen: Asymmetric left renal atrophy and cysts. Abdominal aortic atherosclerosis. Abdominal findings. Musculoskeletal: No chest wall mass or suspicious bone lesions identified. IMPRESSION: 1. Cardiomegaly with small pericardial effusion, interlobular septal edema, and small pleural effusions consistent with CHF or fluid overload. 2. Bilateral  ground-glass opacities with relative peripheral sparing, probably ground-glass edema but underlying pneumonia would be difficult to exclude. 3. Diffuse bronchial thickening which could be congestive and/or bronchitic. 4. Aortic atherosclerosis with aortic graft repair. 5. Slightly increased aneurysmal prominence in the proximal aortic arch but only by 1 mm, more significant increased prominence of the aortic isthmus now 4.9 mm, increased 7 mm. Recommend vascular surgery referral and MRA or CTA. 6. Chronic enlargement of the pulmonary trunk indicating arterial hypertension. 7. 1.5 cm heterogeneous solid nodule posteriorly in the lower pole of the right thyroid lobe. Nonemergent follow-up ultrasound recommended. 8. Asymmetric left renal atrophy and cysts. 9. These results will be called to the ordering clinician or representative by the Radiologist Assistant, and communication documented in the PACS or [employer]. Aortic Atherosclerosis (ICD10-I70.0). Electronically Signed   By: Denman Fischer M.D.   On: 03/31/2024 05:12   DG Chest Port 1 View Result Date: 03/31/2024 CLINICAL DATA:  68 year old female with shortness of breath. EXAM: PORTABLE CHEST 1 VIEW COMPARISON:  Chest CT 12/09/2023 and earlier. FINDINGS: Portable AP semi upright view at 0403 hours. Cardiomegaly and chronically enlarged thoracic aorta contours are stable. Stable lung volumes. No pneumothorax, pleural effusion or consolidation. Diffuse increased pulmonary interstitium when compared to previous radiographs. Visualized tracheal air column is within normal limits. No acute osseous abnormality identified. Negative visible bowel gas. IMPRESSION: Chronic cardiomegaly with diffusely increased pulmonary interstitium favored to reflect acute pulmonary edema. Viral/atypical respiratory infection felt less likely. No pleural effusion or consolidation. Electronically Signed   By: Marlise Simpers M.D.   On: 03/31/2024 04:19    Microbiology: Results for  orders placed or performed during the hospital encounter of 03/31/24  MRSA Next Gen by PCR, Nasal     Status: None   Collection Time: 03/31/24  8:05 AM  Specimen: Nasal Mucosa; Nasal Swab  Result Value Ref Range Status   MRSA by PCR Next Gen NOT DETECTED NOT DETECTED Final    Comment: (NOTE) The GeneXpert MRSA Assay (FDA approved for NASAL specimens only), is one component of a comprehensive MRSA colonization surveillance program. It is not intended to diagnose MRSA infection nor to guide or monitor treatment for MRSA infections. Test performance is not FDA approved in patients less than 45 years old. Performed at [facility] [facility], [address], [place], [place] [address]     Labs: CBC: Recent Labs  Lab 04/15/24 1159 04/16/24 0348 04/17/24 0437 04/18/24 0430  WBC 6.7 4.3 9.4 7.7  NEUTROABS 4.3  --   --   --   HGB 10.8* 10.0* 9.8* 9.3*  HCT 34.0* 32.5* 32.1* 29.9*  MCV 102.7* 105.9* 105.2* 104.5*  PLT 227 204 196 188   Basic Metabolic Panel: Recent Labs  Lab 04/15/24 1159 04/16/24 0348 04/17/24 0437 04/18/24 0430  NA 142 142 140 134*  K 4.5 4.9 5.2* 4.9  CL 113* 113* 112* 107  CO2 19* 20* 18* 18*  GLUCOSE 107* 150* 125* 112*  BUN 51* 57* 76* 85*  CREATININE 3.74* 4.51* 4.50* 4.50*  CALCIUM  9.1 9.2 9.2 8.6*   Liver Function Tests: Recent Labs  Lab 04/16/24 0348  AST 15  ALT 13  ALKPHOS 62  BILITOT 0.8  PROT 7.1  ALBUMIN  3.6   CBG: Recent Labs  Lab 04/17/24 2017 04/17/24 2343 04/18/24 0431 04/18/24 0747 04/18/24 1137  GLUCAP 139* 123* 108* 104* 126*    Discharge time spent: {LESS THAN/GREATER THAN:26388} 30 minutes.  Signed: Aisha Hove, MD [facility] 04/18/2024

## 2024-04-19 DIAGNOSIS — R079 Chest pain, unspecified: Secondary | ICD-10-CM | POA: Insufficient documentation

## 2024-04-20 ENCOUNTER — Encounter: Payer: Self-pay | Admitting: Thoracic Surgery (Cardiothoracic Vascular Surgery)

## 2024-04-20 ENCOUNTER — Ambulatory Visit
Payer: Medicare (Managed Care) | Attending: Thoracic Surgery (Cardiothoracic Vascular Surgery) | Admitting: Thoracic Surgery (Cardiothoracic Vascular Surgery)

## 2024-04-20 VITALS — BP 106/67 | HR 54 | Resp 20 | Ht 68.0 in | Wt 138.9 lb

## 2024-04-20 DIAGNOSIS — Z95828 Presence of other vascular implants and grafts: Secondary | ICD-10-CM | POA: Diagnosis not present

## 2024-04-20 NOTE — Progress Notes (Signed)
 [address]       [place] [address]             [phone]        Mary Stephens [facility] Medical Record #[id number] Date of Birth: February 22, 1956  Referring: Mary Shell, MD Primary Care: System, Provider Not In Primary Cardiologist:None  Chief Complaint:    Chief Complaint  Patient presents with   Follow-up    With chest CT 4/19 and 5/4    History of Present Illness:     Mary Stephens is a 68 y.o. female presents for her surveillance CT scan.  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  She also has been having some chest pain and shortness of breath.  She recently underwent an echocardiogram which showed moderate to severe mitral valve regurgitation and moderate aortic valve insufficiency.  A TEE was attempted but they were unable to placed the probe due to some obstructions in the upper esophagus.    Past Medical History:  Diagnosis Date   Anxiety    Arthritis    Cataract    forming- very small   Colon polyps    Depression    Diabetes mellitus without complication (HCC)    off meds for diabetes- diet controlled   GERD (gastroesophageal reflux disease)    H/O aortic dissection 2021   Hepatitis C    Hyperlipidemia    Hypertension    Neuropathy    feet    Past Surgical History:  Procedure Laterality Date   COLONOSCOPY  ?2001,2011?   in [place] (1st colon 20 polyps removed-per pt)   IR THORACENTESIS ASP PLEURAL SPACE W/IMG GUIDE  12/09/2020   PARTIAL HYSTERECTOMY  2001   REPAIR OF ACUTE ASCENDING THORACIC AORTIC DISSECTION N/A 12/02/2020   Procedure: REPAIR OF ACUTE ASCENDING THORACIC AORTIC DISSECTION VIA CIRC ARREST USING HEMASHIELD PLATINUM 28 MM VASCULAR GRAFT.;  Surgeon: Mary Lovely, MD;  Location: [facility] OR;  Service: Vascular;  Laterality: N/A;  AXILLARY CANNULATION AND CIRC ARREST   REPAIR OF ACUTE ASCENDING THORACIC AORTIC DISSECTION VIA CIRC ARREST USING HEMASHIELD  PLATINUM 28 MM VASCULAR GRAFT. (N/A)  12/02/2020   TEE WITHOUT CARDIOVERSION  12/02/2020   Procedure: TRANSESOPHAGEAL ECHOCARDIOGRAM (TEE);  Surgeon: Mary Lovely, MD;  Location: [facility] OR;  Service: Vascular;;   TRANSESOPHAGEAL ECHOCARDIOGRAM (CATH LAB) N/A 04/17/2024   Procedure: TRANSESOPHAGEAL ECHOCARDIOGRAM;  Surgeon: Mary Matsu, MD;  [facility] INVASIVE CV LAB;  Service: Cardiovascular;  Laterality: N/A;    Social History   Tobacco Use  Smoking Status Every Day   Current packs/day: 0.25   Types: Cigarettes  Smokeless Tobacco Never  Tobacco Comments   admits to cuttting back 4 cigarettes/day    Social History   Substance and Sexual Activity  Alcohol Use Yes   Alcohol/week: 1.0 standard drink of alcohol   Types: 1 Standard drinks or equivalent per week   Comment: wine cooler     No Known Allergies    Current Outpatient Medications  Medication Sig Dispense Refill   acetaminophen  (TYLENOL ) 500 MG tablet Take 500-1,000 mg by mouth every 8 (eight) hours as needed (for pain).     amLODipine  (NORVASC ) 10 MG tablet Take 1 tablet (10 mg total) by mouth daily. 30 tablet 1   aspirin  EC 325 MG EC tablet Take 1 tablet (325 mg total) by mouth daily. 30 tablet 0   atorvastatin  (  LIPITOR) 40 MG tablet Take 40 mg by mouth at bedtime.     carvedilol  (COREG ) 25 MG tablet Take 1 tablet (25 mg total) by mouth 2 (two) times daily with a meal. 60 tablet 2   hydrALAZINE  (APRESOLINE ) 100 MG tablet Take 1 tablet (100 mg total) by mouth 3 (three) times daily. 90 tablet 2   nicotine  (NICODERM CQ  - DOSED IN MG/24 HOURS) 21 mg/24hr patch Place 21 mg onto the skin daily as needed (for smoking cessation).     No current facility-administered medications for this visit.    (Not in a hospital admission)   Family History  Problem Relation Age of Onset   Diabetes Mother    Colon cancer Neg Hx    Colon polyps Neg Hx    Esophageal cancer Neg Hx    Rectal cancer Neg Hx    Stomach cancer  Neg Hx      Review of Systems:   Review of Systems  Constitutional:  Negative for malaise/fatigue.  Respiratory:  Positive for cough and shortness of breath.   Cardiovascular:  Positive for chest pain.      Physical Exam: BP 106/67 (BP Location: Left Arm, Patient Position: Sitting, Cuff Size: Normal)   Pulse (!) 54   Resp 20   Ht 5\' 8"  (1.727 m)   Wt 138 lb 14.4 oz (63 kg)   SpO2 97% Comment: RA  BMI 21.12 kg/m  Physical Exam Constitutional:      General: She is not in acute distress.    Appearance: She is not ill-appearing.  HENT:     Head: Normocephalic and atraumatic.  Cardiovascular:     Rate and Rhythm: Bradycardia present.  Pulmonary:     Effort: Pulmonary effort is normal.  Musculoskeletal:     Cervical back: Normal range of motion.  Neurological:     General: No focal deficit present.     Mental Status: She is alert and oriented to person, place, and time.      I have independently reviewed the above radiologic studies and discussed with the patient   Recent Lab Findings: Lab Results  Component Value Date   WBC 7.7 04/18/2024   HGB 9.3 (L) 04/18/2024   HCT 29.9 (L) 04/18/2024   PLT 188 04/18/2024   GLUCOSE 112 (H) 04/18/2024   CHOL 227 (H) 10/28/2017   TRIG 52 04/01/2024   HDL 49 10/28/2017   LDLCALC 141 (H) 10/28/2017   ALT 13 04/16/2024   AST 15 04/16/2024   NA 134 (L) 04/18/2024   K 4.9 04/18/2024   CL 107 04/18/2024   CREATININE 4.50 (H) 04/18/2024   BUN 85 (H) 04/18/2024   CO2 18 (L) 04/18/2024   TSH 1.633 04/03/2024   INR 1.6 (H) 12/02/2020   HGBA1C 4.5 (L) 04/15/2024      Assessment / Plan:   Mary Stephens 68 y.o. female who status post type a dissection repair.  She recently has been admitted with worsening chest pain and shortness of breath and echocardiogram is demonstrated moderate to severe mitral valve regurgitation and moderate aortic valve insufficiency.  She also has stage IV kidney disease with a creatinine above 4.  I  spoke with her about options would be either redo sternotomy with mitral valve and aortic valve repair versus transcatheter treatment of her mitral valve.  I do not think that there is enough calcium  on her aortic valve to tolerate a TAVR.  In any case I think that both options would  likely result in her ending up on dialysis given her current kidney function.  She is adamant that she does not want to end up on dialysis.  She is requested a second opinion at [facility].  Referrals were made for this.      I  spent 20 minutes counseling the patient face to face.   Mary Stephens 04/20/2024 3:53 PM

## 2024-04-23 ENCOUNTER — Ambulatory Visit (HOSPITAL_COMMUNITY)
Admission: RE | Admit: 2024-04-23 | Discharge: 2024-04-23 | Disposition: A | Discharge status: Home / Self Care | Payer: Medicare (Managed Care) | Source: Ambulatory Visit | Attending: Cardiology | Admitting: Cardiology

## 2024-04-23 DIAGNOSIS — I340 Nonrheumatic mitral (valve) insufficiency: Secondary | ICD-10-CM

## 2024-04-23 LAB — ECHOCARDIOGRAM LIMITED
Area-P 1/2: 5.38 cm2
MV M vel: 5.47 m/s
MV Peak grad: 119.7 mmHg
P 1/2 time: 409 ms
Radius: 0.65 cm
S' Lateral: 3.6 cm

## 2024-04-25 ENCOUNTER — Ambulatory Visit: Payer: Self-pay | Admitting: Physician Assistant

## 2024-04-30 ENCOUNTER — Encounter (HOSPITAL_COMMUNITY): Payer: Self-pay | Admitting: Emergency Medicine

## 2024-04-30 ENCOUNTER — Emergency Department (HOSPITAL_COMMUNITY): Payer: Medicare (Managed Care)

## 2024-04-30 ENCOUNTER — Observation Stay (HOSPITAL_COMMUNITY)
Admission: EM | Admit: 2024-04-30 | Discharge: 2024-05-01 | Disposition: A | Discharge status: Home / Self Care | Payer: Medicare (Managed Care) | Attending: Family Medicine | Admitting: Family Medicine

## 2024-04-30 ENCOUNTER — Other Ambulatory Visit: Payer: Self-pay

## 2024-04-30 DIAGNOSIS — I5031 Acute diastolic (congestive) heart failure: Secondary | ICD-10-CM

## 2024-04-30 DIAGNOSIS — Z95828 Presence of other vascular implants and grafts: Secondary | ICD-10-CM

## 2024-04-30 DIAGNOSIS — I5033 Acute on chronic diastolic (congestive) heart failure: Secondary | ICD-10-CM | POA: Diagnosis not present

## 2024-04-30 DIAGNOSIS — I132 Hypertensive heart and chronic kidney disease with heart failure and with stage 5 chronic kidney disease, or end stage renal disease: Secondary | ICD-10-CM | POA: Diagnosis not present

## 2024-04-30 DIAGNOSIS — I340 Nonrheumatic mitral (valve) insufficiency: Secondary | ICD-10-CM | POA: Diagnosis present

## 2024-04-30 DIAGNOSIS — N184 Chronic kidney disease, stage 4 (severe): Secondary | ICD-10-CM | POA: Diagnosis present

## 2024-04-30 DIAGNOSIS — Z7984 Long term (current) use of oral hypoglycemic drugs: Secondary | ICD-10-CM | POA: Insufficient documentation

## 2024-04-30 DIAGNOSIS — R0602 Shortness of breath: Secondary | ICD-10-CM | POA: Diagnosis present

## 2024-04-30 DIAGNOSIS — B182 Chronic viral hepatitis C: Secondary | ICD-10-CM | POA: Insufficient documentation

## 2024-04-30 DIAGNOSIS — N185 Chronic kidney disease, stage 5: Secondary | ICD-10-CM | POA: Diagnosis not present

## 2024-04-30 DIAGNOSIS — D631 Anemia in chronic kidney disease: Secondary | ICD-10-CM | POA: Diagnosis not present

## 2024-04-30 DIAGNOSIS — I10 Essential (primary) hypertension: Secondary | ICD-10-CM | POA: Diagnosis present

## 2024-04-30 DIAGNOSIS — Z8679 Personal history of other diseases of the circulatory system: Secondary | ICD-10-CM

## 2024-04-30 DIAGNOSIS — J810 Acute pulmonary edema: Secondary | ICD-10-CM

## 2024-04-30 DIAGNOSIS — E1122 Type 2 diabetes mellitus with diabetic chronic kidney disease: Secondary | ICD-10-CM | POA: Insufficient documentation

## 2024-04-30 DIAGNOSIS — F17200 Nicotine dependence, unspecified, uncomplicated: Secondary | ICD-10-CM | POA: Insufficient documentation

## 2024-04-30 DIAGNOSIS — Z7982 Long term (current) use of aspirin: Secondary | ICD-10-CM | POA: Diagnosis not present

## 2024-04-30 DIAGNOSIS — R079 Chest pain, unspecified: Secondary | ICD-10-CM | POA: Diagnosis not present

## 2024-04-30 DIAGNOSIS — Z952 Presence of prosthetic heart valve: Secondary | ICD-10-CM | POA: Diagnosis not present

## 2024-04-30 DIAGNOSIS — Z79899 Other long term (current) drug therapy: Secondary | ICD-10-CM | POA: Insufficient documentation

## 2024-04-30 LAB — CBC
HCT: 29.8 % — ABNORMAL LOW (ref 36.0–46.0)
Hemoglobin: 9.3 g/dL — ABNORMAL LOW (ref 12.0–15.0)
MCH: 32.4 pg (ref 26.0–34.0)
MCHC: 31.2 g/dL (ref 30.0–36.0)
MCV: 103.8 fL — ABNORMAL HIGH (ref 80.0–100.0)
Platelets: 137 10*3/uL — ABNORMAL LOW (ref 150–400)
RBC: 2.87 MIL/uL — ABNORMAL LOW (ref 3.87–5.11)
RDW: 13.1 % (ref 11.5–15.5)
WBC: 6 10*3/uL (ref 4.0–10.5)
nRBC: 0 % (ref 0.0–0.2)

## 2024-04-30 LAB — RESP PANEL BY RT-PCR (RSV, FLU A&B, COVID)  RVPGX2
Influenza A by PCR: NEGATIVE
Influenza B by PCR: NEGATIVE
Resp Syncytial Virus by PCR: NEGATIVE
SARS Coronavirus 2 by RT PCR: NEGATIVE

## 2024-04-30 LAB — COMPREHENSIVE METABOLIC PANEL WITH GFR
ALT: 13 U/L (ref 0–44)
AST: 16 U/L (ref 15–41)
Albumin: 3.3 g/dL — ABNORMAL LOW (ref 3.5–5.0)
Alkaline Phosphatase: 50 U/L (ref 38–126)
Anion gap: 9 (ref 5–15)
BUN: 34 mg/dL — ABNORMAL HIGH (ref 8–23)
CO2: 20 mmol/L — ABNORMAL LOW (ref 22–32)
Calcium: 8.9 mg/dL (ref 8.9–10.3)
Chloride: 114 mmol/L — ABNORMAL HIGH (ref 98–111)
Creatinine, Ser: 3.55 mg/dL — ABNORMAL HIGH (ref 0.44–1.00)
GFR, Estimated: 13 mL/min — ABNORMAL LOW (ref >60)
Glucose, Bld: 105 mg/dL — ABNORMAL HIGH (ref 70–99)
Potassium: 4 mmol/L (ref 3.5–5.1)
Sodium: 143 mmol/L (ref 135–145)
Total Bilirubin: 0.7 mg/dL (ref 0.0–1.2)
Total Protein: 6.4 g/dL — ABNORMAL LOW (ref 6.5–8.1)

## 2024-04-30 LAB — BRAIN NATRIURETIC PEPTIDE: B Natriuretic Peptide: 3584.8 pg/mL — ABNORMAL HIGH (ref 0.0–100.0)

## 2024-04-30 LAB — TROPONIN I (HIGH SENSITIVITY)
Troponin I (High Sensitivity): 20 ng/L — ABNORMAL HIGH (ref <18)
Troponin I (High Sensitivity): 19 ng/L — ABNORMAL HIGH (ref <18)

## 2024-04-30 LAB — I-STAT CG4 LACTIC ACID, ED: Lactic Acid, Venous: 0.4 mmol/L — ABNORMAL LOW (ref 0.5–1.9)

## 2024-04-30 MED ORDER — ASPIRIN 325 MG PO TBEC
325.0000 mg | DELAYED_RELEASE_TABLET | Freq: Every day | ORAL | Status: DC
  Administered 2024-05-01: 325 mg via ORAL
  Filled 2024-04-30: qty 1

## 2024-04-30 MED ORDER — ACETAMINOPHEN 650 MG RE SUPP: 650.0000 mg | Freq: Four times a day (QID) | RECTAL | Status: DC | PRN

## 2024-04-30 MED ORDER — ONDANSETRON HCL 4 MG/2ML IJ SOLN: 4.0000 mg | Freq: Four times a day (QID) | INTRAMUSCULAR | Status: DC | PRN

## 2024-04-30 MED ORDER — CARVEDILOL 12.5 MG PO TABS
25.0000 mg | ORAL_TABLET | Freq: Two times a day (BID) | ORAL | Status: DC
  Administered 2024-05-01: 25 mg via ORAL
  Filled 2024-04-30: qty 2

## 2024-04-30 MED ORDER — FUROSEMIDE 10 MG/ML IJ SOLN
40.0000 mg | Freq: Once | INTRAMUSCULAR | Status: AC
  Administered 2024-04-30: 40 mg via INTRAVENOUS
  Filled 2024-04-30: qty 4

## 2024-04-30 MED ORDER — AMLODIPINE BESYLATE 5 MG PO TABS
10.0000 mg | ORAL_TABLET | Freq: Every day | ORAL | Status: DC
  Administered 2024-05-01: 10 mg via ORAL
  Filled 2024-04-30: qty 2

## 2024-04-30 MED ORDER — HEPARIN SODIUM (PORCINE) 5000 UNIT/ML IJ SOLN: 5000.0000 [IU] | Freq: Three times a day (TID) | INTRAMUSCULAR | Status: DC

## 2024-04-30 MED ORDER — HYDRALAZINE HCL 50 MG PO TABS
100.0000 mg | ORAL_TABLET | Freq: Three times a day (TID) | ORAL | Status: DC
  Administered 2024-04-30 – 2024-05-01 (×2): 100 mg via ORAL
  Filled 2024-04-30 (×2): qty 2

## 2024-04-30 MED ORDER — ONDANSETRON HCL 4 MG PO TABS: 4.0000 mg | ORAL_TABLET | Freq: Four times a day (QID) | ORAL | Status: DC | PRN

## 2024-04-30 MED ORDER — ATORVASTATIN CALCIUM 40 MG PO TABS
40.0000 mg | ORAL_TABLET | Freq: Every day | ORAL | Status: DC
  Administered 2024-04-30: 40 mg via ORAL
  Filled 2024-04-30: qty 1

## 2024-04-30 MED ORDER — SORBITOL 70 % SOLN: 30.0000 mL | Freq: Every day | Status: DC | PRN

## 2024-04-30 MED ORDER — ACETAMINOPHEN 500 MG PO TABS
500.0000 mg | ORAL_TABLET | Freq: Four times a day (QID) | ORAL | Status: DC | PRN
  Administered 2024-05-01: 500 mg via ORAL
  Filled 2024-04-30: qty 1

## 2024-04-30 MED ORDER — HYDRALAZINE HCL 20 MG/ML IJ SOLN: 10.0000 mg | Freq: Four times a day (QID) | INTRAMUSCULAR | Status: DC | PRN

## 2024-04-30 NOTE — ED Provider Notes (Signed)
 Mary Stephens   CSN: [id number] Arrival date & time: 04/30/24  1607     History  Chief Complaint  Patient presents with  . Abdominal Pain  . Shortness of Breath    Mary Stephens is a 68 y.o. female.  Patient is a 68 year old female with a past medical history of type a aortic dissection s/p repair, hypertension, diabetes, CKD, mitral valve regurgitation and aortic insufficiency presenting to the emergency department with shortness of breath.  The patient states that Mary Stephens was recently admitted with similar symptoms and saw her CT surgeon who thought this was related to her heart valve.  Mary Stephens states that Mary Stephens was referred to [facility] for possible procedure.  Mary Stephens states that this morning Mary Stephens had significant worsening of her shortness of breath.  Mary Stephens states that Mary Stephens has also been feeling a pressure type of pain in her epigastrium/lower chest that is been coming and going throughout the day.  Mary Stephens states that Mary Stephens is unable to lay back as this worsens her shortness of breath.  Mary Stephens denies any fever or cough or lower extremity swelling.  Mary Stephens states that Mary Stephens is not on any Lasix  at home.  The history is provided by the patient.  Abdominal Pain Associated symptoms: shortness of breath   Shortness of Breath Associated symptoms: abdominal pain        Home Medications Prior to Admission medications   Medication Sig Start Date End Date Taking? Authorizing Provider  acetaminophen  (TYLENOL ) 500 MG tablet Take 500-1,000 mg by mouth every 8 (eight) hours as needed (for pain).    [provider]  amLODipine  (NORVASC ) 10 MG tablet Take 1 tablet (10 mg total) by mouth daily. 04/03/24   Maylene Spear, MD  aspirin  EC 325 MG EC tablet Take 1 tablet (325 mg total) by mouth daily. 12/10/20   Barrett, Malachy Scripture, PA-C  atorvastatin  (LIPITOR) 40 MG tablet Take 40 mg by mouth at bedtime.    [provider]  carvedilol  (COREG ) 25 MG tablet Take  1 tablet (25 mg total) by mouth 2 (two) times daily with a meal. 04/03/24   Maylene Spear, MD  hydrALAZINE  (APRESOLINE ) 100 MG tablet Take 1 tablet (100 mg total) by mouth 3 (three) times daily. 04/03/24   Krishnan, Gokul, MD  nicotine  (NICODERM CQ  - DOSED IN MG/24 HOURS) 21 mg/24hr patch Place 21 mg onto the skin daily as needed (for smoking cessation).    [provider]  glipiZIDE  (GLUCOTROL  XL) 5 MG 24 hr tablet Take 1 tablet (5 mg total) by mouth daily with breakfast. Patient not taking: Reported on 06/20/2020 01/27/18 06/20/20  Fleming, Zelda W, NP      Allergies    Patient has no known allergies.    Review of Systems   Review of Systems  Respiratory:  Positive for shortness of breath.   Gastrointestinal:  Positive for abdominal pain.    Physical Exam Updated Vital Signs BP (!) 141/84 (BP Location: Right Arm)   Pulse 63   Temp 97.7 F (36.5 C) (Oral)   Resp (!) 22   SpO2 97%  Physical Exam Vitals and nursing Stephens reviewed.  Constitutional:      General: Mary Stephens is not in acute distress.    Appearance: Mary Stephens is well-developed. Mary Stephens is ill-appearing.  HENT:     Head: Normocephalic.     Mouth/Throat:     Mouth: Mucous membranes are moist.  Eyes:     Extraocular Movements:  Extraocular movements intact.  Cardiovascular:     Rate and Rhythm: Normal rate and regular rhythm.     Heart sounds: Normal heart sounds.     Comments: Positive JVD approximately 6 cm above clavicle Pulmonary:     Effort: Pulmonary effort is normal.     Breath sounds: Normal breath sounds.  Abdominal:     General: Abdomen is flat.     Palpations: Abdomen is soft.     Tenderness: There is no abdominal tenderness.  Musculoskeletal:        General: Normal range of motion.     Right lower leg: No edema.     Left lower leg: No edema.  Skin:    General: Skin is warm and dry.  Neurological:     General: No focal deficit present.     Mental Status: Mary Stephens is alert and oriented to person, place, and time.   Psychiatric:        Mood and Affect: Mood normal.        Behavior: Behavior normal.     ED Results / Procedures / Treatments   Labs (all labs ordered are listed, but only abnormal results are displayed) Labs Reviewed  COMPREHENSIVE METABOLIC PANEL WITH GFR - Abnormal; Notable for the following components:      Result Value   Chloride 114 (*)    CO2 20 (*)    Glucose, Bld 105 (*)    BUN 34 (*)    Creatinine, Ser 3.55 (*)    Total Protein 6.4 (*)    Albumin  3.3 (*)    GFR, Estimated 13 (*)    All other components within normal limits  CBC - Abnormal; Notable for the following components:   RBC 2.87 (*)    Hemoglobin 9.3 (*)    HCT 29.8 (*)    MCV 103.8 (*)    Platelets 137 (*)    All other components within normal limits  BRAIN NATRIURETIC PEPTIDE - Abnormal; Notable for the following components:   B Natriuretic Peptide 3,584.8 (*)    All other components within normal limits  TROPONIN I (HIGH SENSITIVITY) - Abnormal; Notable for the following components:   Troponin I (High Sensitivity) 20 (*)    All other components within normal limits  RESP PANEL BY RT-PCR (RSV, FLU A&B, COVID)  RVPGX2  TROPONIN I (HIGH SENSITIVITY)    EKG EKG Interpretation Date/Time:  Monday Apr 30 2024 16:35:05 EDT Ventricular Rate:  68 PR Interval:  162 QRS Duration:  90 QT Interval:  426 QTC Calculation: 452 R Axis:   31  Text Interpretation: Normal sinus rhythm Possible Left atrial enlargement Nonspecific ST and T wave abnormality Abnormal ECG No significant change since last tracing Confirmed by Celesta Coke ([id number]) on 04/30/2024 5:37:51 PM  Radiology DG Chest Port 1 View Result Date: 04/30/2024 CLINICAL DATA:  Shortness of breath. EXAM: PORTABLE CHEST 1 VIEW COMPARISON:  04/15/2024 FINDINGS: Anterior lordotic positioning with low lung volumes. Prior median sternotomy. Cardiomegaly with stable aortic tortuosity. Increased peribronchial and interstitial thickening suspicious for  pulmonary edema. There are small bilateral pleural effusions, increased from prior exam. No pneumothorax. IMPRESSION: Cardiomegaly with pulmonary edema and small bilateral pleural effusions. Electronically Signed   By: Chadwick Colonel M.D.   On: 04/30/2024 17:47    Procedures Procedures    Medications Ordered in ED Medications  furosemide  (LASIX ) injection 40 mg (40 mg Intravenous Given 04/30/24 1910)    ED Course/ Medical Decision Making/ A&P Clinical Course as of  04/30/24 2149  Mon Apr 30, 2024  2148 I spoke with Dr. Jacklyn Mash with cardiology who will evaluate. [VK]    Clinical Course User Index [VK] Kingsley, Madelaine Whipple K, DO                                 Medical Decision Making This patient presents to the ED with chief complaint(s) of shortness of breath with pertinent past medical history of prior aortic dissection status post repair, hypertension, diabetes, mitral regurgitation, aortic insufficiency, CKD which further complicates the presenting complaint. The complaint involves an extensive differential diagnosis and also carries with it a high risk of complications and morbidity.    The differential diagnosis includes ACS, arrhythmia, anemia, pneumonia, pneumothorax, pulmonary edema, pleural effusion, pancreatitis, hepatitis, gastritis, GERD  Additional history obtained: Additional history obtained from N/A Records reviewed previous admission documents and outpatient CT surgery records  ED Course and Reassessment: On patient's arrival Mary Stephens is mildly tachypneic but otherwise hemodynamically stable in no acute distress.  Mary Stephens was initially evaluated in triage and had labs performed.  Creatinine and anemia is at her baseline.  Mary Stephens does have an elevated BNP to 3500 and initial troponin of 20.  EKG had no acute ischemic changes.  Chest x-ray did show pulmonary edema and Mary Stephens does have elevated JVD concerning for volume overload.  Mary Stephens will be started on Lasix .  Patient will need  admission for diuresis.  Independent labs interpretation:  The following labs were independently interpreted: Mildly elevated troponin, otherwise 12 BNP 3500, otherwise labs at baseline  Independent visualization of imaging: - I independently visualized the following imaging with scope of interpretation limited to determining acute life threatening conditions related to emergency care: Chest x-ray, which revealed cardiomegaly with pulmonary edema  Consultation: - Consulted or discussed management/test interpretation w/ external professional: Hospitalists,  Consideration for admission or further workup: Patient requires admission for volume overload Social Determinants of health: N/A    Amount and/or Complexity of Data Reviewed Labs: ordered. Radiology: ordered.  Risk Prescription drug management.          Final Clinical Impression(s) / ED Diagnoses Final diagnoses:  None    Rx / DC Orders ED Discharge Orders     None         Kingsley, Shaquaya Wuellner K, DO 04/30/24 1923

## 2024-04-30 NOTE — H&P (Signed)
 History and Physical    Patient: Mary Stephens UUV:[id number] DOB: Jun 01, 1956 DOA: 04/30/2024 DOS: the patient was seen and examined on 04/30/2024 PCP: System, Provider Not In  Patient coming from: Home  Chief Complaint:  Chief Complaint  Patient presents with   Abdominal Pain   Shortness of Breath   HPI: Mary Stephens is a 68 y.o. female with medical history significant for stage IV CKD with baseline creatinine around 4, history of aortic dissection repair in 2021, hypertension, hyperlipidemia and severe mitral regurg, and moderate aortic insufficiency who presents because she could not breathe.  She has been struggling with breathing whenever she lay down for more than a week.  Her shortness of breath has been severe for the last 3 days.  She finally came in when she just could not breathe at all.  Does not take Lasix  on a daily basis.  She describes her pain as a fist sitting underneath her chest bone with constant hard pressure.  In the emergency department the patient was evaluated and felt to be volume overloaded.  She received IV Lasix  with good results.  The patient said her breathing was significantly better by the time of my evaluation in the painful feeling of a fist in her epigastric area had resolved.    Review of Systems: As mentioned in the history of present illness. All other systems reviewed and are negative. Past Medical History:  Diagnosis Date   Anxiety    Arthritis    Cataract    forming- very small   Colon polyps    Depression    Diabetes mellitus without complication (HCC)    off meds for diabetes- diet controlled   GERD (gastroesophageal reflux disease)    H/O aortic dissection 2021   Hepatitis C    Hyperlipidemia    Hypertension    Neuropathy    feet   Past Surgical History:  Procedure Laterality Date   COLONOSCOPY  ?2001,2011?   in [place] (1st colon 20 polyps removed-per pt)   IR THORACENTESIS ASP PLEURAL SPACE W/IMG GUIDE  12/09/2020    PARTIAL HYSTERECTOMY  2001   REPAIR OF ACUTE ASCENDING THORACIC AORTIC DISSECTION N/A 12/02/2020   Procedure: REPAIR OF ACUTE ASCENDING THORACIC AORTIC DISSECTION VIA CIRC ARREST USING HEMASHIELD PLATINUM 28 MM VASCULAR GRAFT.;  Surgeon: Hilarie Lovely, MD;  Location: [facility] OR;  Service: Vascular;  Laterality: N/A;  AXILLARY CANNULATION AND CIRC ARREST   REPAIR OF ACUTE ASCENDING THORACIC AORTIC DISSECTION VIA CIRC ARREST USING HEMASHIELD PLATINUM 28 MM VASCULAR GRAFT. (N/A)  12/02/2020   TEE WITHOUT CARDIOVERSION  12/02/2020   Procedure: TRANSESOPHAGEAL ECHOCARDIOGRAM (TEE);  Surgeon: Hilarie Lovely, MD;  Location: [facility] OR;  Service: Vascular;;   TRANSESOPHAGEAL ECHOCARDIOGRAM (CATH LAB) N/A 04/17/2024   Procedure: TRANSESOPHAGEAL ECHOCARDIOGRAM;  Surgeon: Jacqueline Matsu, MD;  Location: [facility] INVASIVE CV LAB;  Service: Cardiovascular;  Laterality: N/A;   Social History:  reports that she has been smoking cigarettes. She has never used smokeless tobacco. She reports current alcohol use of about 1.0 standard drink of alcohol per week. She reports that she does not currently use drugs after having used the following drugs: Marijuana.  No Known Allergies  Family History  Problem Relation Age of Onset   Diabetes Mother    Colon cancer Neg Hx    Colon polyps Neg Hx    Esophageal cancer Neg Hx    Rectal cancer Neg Hx    Stomach cancer Neg Hx  Prior to Admission medications   Medication Sig Start Date End Date Taking? Authorizing Provider  acetaminophen  (TYLENOL ) 500 MG tablet Take 500-1,000 mg by mouth every 8 (eight) hours as needed (for pain).   Yes [provider]  amLODipine  (NORVASC ) 10 MG tablet Take 1 tablet (10 mg total) by mouth daily. 04/03/24  Yes Maylene Spear, MD  aspirin  EC 325 MG EC tablet Take 1 tablet (325 mg total) by mouth daily. 12/10/20  Yes Barrett, Erin R, PA-C  atorvastatin  (LIPITOR) 40 MG tablet Take 40 mg by mouth at bedtime.   Yes [provider]  carvedilol  (COREG ) 25 MG tablet Take 1 tablet (25 mg total) by mouth 2 (two) times daily with a meal. 04/03/24  Yes Maylene Spear, MD  hydrALAZINE  (APRESOLINE ) 100 MG tablet Take 1 tablet (100 mg total) by mouth 3 (three) times daily. 04/03/24  Yes Krishnan, Gokul, MD  nicotine  (NICODERM CQ  - DOSED IN MG/24 HOURS) 21 mg/24hr patch Place 21 mg onto the skin daily as needed (for smoking cessation).   Yes [provider]  glipiZIDE  (GLUCOTROL  XL) 5 MG 24 hr tablet Take 1 tablet (5 mg total) by mouth daily with breakfast. Patient not taking: Reported on 06/20/2020 01/27/18 06/20/20  Collins Dean, NP    Physical Exam: Vitals:   04/30/24 1830 04/30/24 1958 04/30/24 2004 04/30/24 2230  BP: (!) 152/121  (!) 150/93 (!) 150/135  Pulse: 76 76 76 77  Resp: (!) 31  16 (!) 25  Temp:   97.6 F (36.4 C)   TempSrc:   Oral   SpO2: 90% 98% 93% 98%   Physical Exam:  General: No acute distress, well developed, well nourished HEENT: Normocephalic, atraumatic, PERRL Cardiovascular: Normal rate and rhythm. Distal pulses intact. SM slight heave of chest Pulmonary: Normal pulmonary effort, mild rales in the bases Gastrointestinal: Nondistended abdomen, soft, non-tender, normoactive bowel sounds Musculoskeletal:Normal ROM, no lower ext edem Skin: Skin is warm and dry. Neuro: No focal deficits noted, AAOx3. PSYCH: Attentive and cooperative  Data Reviewed:  Results for orders placed or performed during the hospital encounter of 04/30/24 (from the past 24 hours)  Resp panel by RT-PCR (RSV, Flu A&B, Covid) Anterior Nasal Swab     Status: None   Collection Time: 04/30/24  4:16 PM   Specimen: Anterior Nasal Swab  Result Value Ref Range   SARS Coronavirus 2 by RT PCR NEGATIVE NEGATIVE   Influenza A by PCR NEGATIVE NEGATIVE   Influenza B by PCR NEGATIVE NEGATIVE   Resp Syncytial Virus by PCR NEGATIVE NEGATIVE  Comprehensive metabolic panel     Status: Abnormal   Collection Time: 04/30/24  4:16 PM   Result Value Ref Range   Sodium 143 135 - 145 mmol/L   Potassium 4.0 3.5 - 5.1 mmol/L   Chloride 114 (H) 98 - 111 mmol/L   CO2 20 (L) 22 - 32 mmol/L   Glucose, Bld 105 (H) 70 - 99 mg/dL   BUN 34 (H) 8 - 23 mg/dL   Creatinine, Ser 7.82 (H) 0.44 - 1.00 mg/dL   Calcium  8.9 8.9 - 10.3 mg/dL   Total Protein 6.4 (L) 6.5 - 8.1 g/dL   Albumin  3.3 (L) 3.5 - 5.0 g/dL   AST 16 15 - 41 U/L   ALT 13 0 - 44 U/L   Alkaline Phosphatase 50 38 - 126 U/L   Total Bilirubin 0.7 0.0 - 1.2 mg/dL   GFR, Estimated 13 (L) >60 mL/min   Anion gap  9 5 - 15  CBC     Status: Abnormal   Collection Time: 04/30/24  4:16 PM  Result Value Ref Range   WBC 6.0 4.0 - 10.5 K/uL   RBC 2.87 (L) 3.87 - 5.11 MIL/uL   Hemoglobin 9.3 (L) 12.0 - 15.0 g/dL   HCT 62.9 (L) 52.8 - 41.3 %   MCV 103.8 (H) 80.0 - 100.0 fL   MCH 32.4 26.0 - 34.0 pg   MCHC 31.2 30.0 - 36.0 g/dL   RDW 24.4 01.0 - 27.2 %   Platelets 137 (L) 150 - 400 K/uL   nRBC 0.0 0.0 - 0.2 %  Troponin I (High Sensitivity)     Status: Abnormal   Collection Time: 04/30/24  4:16 PM  Result Value Ref Range   Troponin I (High Sensitivity) 20 (H) <18 ng/L  Brain natriuretic peptide     Status: Abnormal   Collection Time: 04/30/24  4:20 PM  Result Value Ref Range   B Natriuretic Peptide 3,584.8 (H) 0.0 - 100.0 pg/mL  Troponin I (High Sensitivity)     Status: Abnormal   Collection Time: 04/30/24  6:35 PM  Result Value Ref Range   Troponin I (High Sensitivity) 19 (H) <18 ng/L  I-Stat CG4 Lactic Acid     Status: Abnormal   Collection Time: 04/30/24  9:56 PM  Result Value Ref Range   Lactic Acid, Venous 0.4 (L) 0.5 - 1.9 mmol/L   Echo 04/23/2024 1. Left ventricular ejection fraction, by estimation, is 60 to 65%. The  left ventricle has normal function. The left ventricle has no regional  wall motion abnormalities. There is mild concentric left ventricular  hypertrophy.   2. Right ventricular systolic function is normal. The right ventricular  size is normal.    3. Left atrial size was severely dilated.   4. MR directed more posterior into LA Vena contracta width 0.7 . Moderate  to severe mitral valve regurgitation.   5. Aortic valve regurgitation is moderate.   6. Dissection flap in descending aorta seen (known).    Assessment and Plan: Severe MR/ Pulmonary edema/ EF=60%/  H/o Aortic dissection repair 2021 - Appreciate cardiology recommendations - Will continue to diurese.  Her breathing has improved greatly since arrival - She has never had SOB/ pulmonary edema before.  - Last echo was last was one month ago but consider repeat given worsened symptoms - BP control, keeping sbp<140 per cardiologist  2.CKD - - The patient says she has never seen a nephrologist before.   - Nephrology consult in am   3. H/o DMT2 - No longer on medication. - Check HgA1c  4. H/o Tobacco abuse    Advance Care Planning:   Code Status: Full Code  The patient names her daughter as her surrogate decision maker and she wants to be full code.  Consults: [facility]  Family Communication: daughter at bedside  Severity of Illness: The appropriate patient status for this patient is INPATIENT. Inpatient status is judged to be reasonable and necessary in order to provide the required intensity of service to ensure the patient's safety. The patient's presenting symptoms, physical exam findings, and initial radiographic and laboratory data in the context of their chronic comorbidities is felt to place them at high risk for further clinical deterioration. Furthermore, it is not anticipated that the patient will be medically stable for discharge from the hospital within 2 midnights of admission.   * I certify that at the point of admission it is  my clinical judgment that the patient will require inpatient hospital care spanning beyond 2 midnights from the point of admission due to high intensity of service, high risk for further deterioration and high frequency of  surveillance required.*  Author: Willadean Hark, MD 04/30/2024 11:09 PM  For on call review [email or web address].

## 2024-04-30 NOTE — ED Triage Notes (Signed)
 Pt BIB by EMS for epigastric pain and SHOB. Symptoms have been present for several weeks but have been waxing and waning. Denies any fevers, N/V/D, or palpitations. Hx of aortic dissection that was repaired in 2021.  EMS VS: BP 150/90 HR 68 97% RA

## 2024-04-30 NOTE — ED Notes (Signed)
 Nt called CCMD to put pt on monitor

## 2024-04-30 NOTE — Consult Note (Addendum)
 Cardiology Consultation   Patient ID: Mary Stephens MRN: [id number]; DOB: 1956/01/19  Admit date: 04/30/2024 Date of Consult: 05/01/2024  PCP:  System, Provider Not In   [facility] Providers Cardiologist:  None      History of Present Illness:  Mary Stephens is a 68 y.o. female with a hx of type A aortic dissection s/p repair (2021), Mitral regurgitation, aortic insufficiency,  HTN, DM, CKD, who is being seen 05/01/2024 for the evaluation of HF exacerbation at the request of hospital medicine.   Patient was recently admitted from 5/4-5/7 with shortness of breath at which time V/Q scan was negative and CT did not show significant pulmonary edema, but demonstrated a small right pleural effusion. She was evaluated by CT surgery who was concerned her symptoms were related to her mitral valve regurgitation and aortic insufficiency. She had been referred to [facility] for further evaluation and consideration of repair.   Today, she re-presented to the ER with worsening of shortness of breath since her prior admission and lower chest/epigastric chest pain. She reports significant orthopnea, PND; currently sleeping on 2 pillows in a recliner. She reports her chest pain started last night and felt like squeezing with radiation to her left arm and associated shortness of breath. She denies any fever, cough, lower extremity swelling. Does not take lasix  at home. Reports BP at home is 130/80, unsure of her home medications.   Patient had a recent TTE on 04/23/24 that demonstrated: LVEF 60-65% without WMAs; mild concentric LVH, RVSF wnl, LA severely dilated, mod-severe MR (posteriorly directed), moderate aortic valve regurgitation, known dissection flap in the descending aorta.   ER course: Vitals: T 97.56F, HR 63, RR 22, BP 141/84, Sat 97% on RA  Labs: WBC 6.0, Hgb 9.3 (MCV 103.8), plt 137, Na 143, K 4.0, Cl 114, Ca 8.9, CO2 20, Glu 105, Bun 34, Cr 3.55, Alb 3.3, BNP 3584, Trop 20 -> 19. EKG no  acute ischemic changes CXR: Cardiomegaly with pulmonary edema and small bilateral pleural Interventions: lasix  40mg  IV; with good UOP   Past Medical History:  Diagnosis Date   Anxiety    Arthritis    Cataract    forming- very small   Colon polyps    Depression    Diabetes mellitus without complication (HCC)    off meds for diabetes- diet controlled   GERD (gastroesophageal reflux disease)    H/O aortic dissection 2021   Hepatitis C    Hyperlipidemia    Hypertension    Neuropathy    feet    Past Surgical History:  Procedure Laterality Date   COLONOSCOPY  ?2001,2011?   in [place] (1st colon 20 polyps removed-per pt)   IR THORACENTESIS ASP PLEURAL SPACE W/IMG GUIDE  12/09/2020   PARTIAL HYSTERECTOMY  2001   REPAIR OF ACUTE ASCENDING THORACIC AORTIC DISSECTION N/A 12/02/2020   Procedure: REPAIR OF ACUTE ASCENDING THORACIC AORTIC DISSECTION VIA CIRC ARREST USING HEMASHIELD PLATINUM 28 MM VASCULAR GRAFT.;  Surgeon: Hilarie Lovely, MD;  Location: [facility] OR;  Service: Vascular;  Laterality: N/A;  AXILLARY CANNULATION AND CIRC ARREST   REPAIR OF ACUTE ASCENDING THORACIC AORTIC DISSECTION VIA CIRC ARREST USING HEMASHIELD PLATINUM 28 MM VASCULAR GRAFT. (N/A)  12/02/2020   TEE WITHOUT CARDIOVERSION  12/02/2020   Procedure: TRANSESOPHAGEAL ECHOCARDIOGRAM (TEE);  Surgeon: Hilarie Lovely, MD;  [facility] OR;  Service: Vascular;;   TRANSESOPHAGEAL ECHOCARDIOGRAM (CATH LAB) N/A 04/17/2024   Procedure: TRANSESOPHAGEAL ECHOCARDIOGRAM;  Surgeon: Jacqueline Matsu, MD;  Location: [facility] INVASIVE CV LAB;  Service: Cardiovascular;  Laterality: N/A;     Home Medications:  Prior to Admission medications   Medication Sig Start Date End Date Taking? Authorizing Provider  acetaminophen  (TYLENOL ) 500 MG tablet Take 500-1,000 mg by mouth every 8 (eight) hours as needed (for pain).   Yes [provider]  amLODipine  (NORVASC ) 10 MG tablet Take 1 tablet (10 mg total) by mouth daily. 04/03/24   Yes Maylene Spear, MD  aspirin  EC 325 MG EC tablet Take 1 tablet (325 mg total) by mouth daily. 12/10/20  Yes Barrett, Erin R, PA-C  atorvastatin  (LIPITOR) 40 MG tablet Take 40 mg by mouth at bedtime.   Yes [provider]  carvedilol  (COREG ) 25 MG tablet Take 1 tablet (25 mg total) by mouth 2 (two) times daily with a meal. 04/03/24  Yes Maylene Spear, MD  hydrALAZINE  (APRESOLINE ) 100 MG tablet Take 1 tablet (100 mg total) by mouth 3 (three) times daily. 04/03/24  Yes Krishnan, Gokul, MD  nicotine  (NICODERM CQ  - DOSED IN MG/24 HOURS) 21 mg/24hr patch Place 21 mg onto the skin daily as needed (for smoking cessation).   Yes [provider]  glipiZIDE  (GLUCOTROL  XL) 5 MG 24 hr tablet Take 1 tablet (5 mg total) by mouth daily with breakfast. Patient not taking: Reported on 06/20/2020 01/27/18 06/20/20  Fleming, Zelda W, NP    Inpatient Medications: Scheduled Meds:  amLODipine   10 mg Oral Daily   aspirin  EC  325 mg Oral Daily   atorvastatin   40 mg Oral QHS   carvedilol   25 mg Oral BID WC   heparin   5,000 Units Subcutaneous Q8H   hydrALAZINE   100 mg Oral TID   Continuous Infusions:  PRN Meds: acetaminophen  **OR** acetaminophen , hydrALAZINE , ondansetron  **OR** ondansetron  (ZOFRAN ) IV, sorbitol   Allergies:   No Known Allergies  Social History:   Social History   Socioeconomic History   Marital status: Single    Spouse name: Not on file   Number of children: 1   Years of education: Not on file   Highest education level: Not on file  Occupational History   Occupation: unemployed  Tobacco Use   Smoking status: Every Day    Current packs/day: 0.25    Types: Cigarettes   Smokeless tobacco: Never   Tobacco comments:    admits to cuttting back 4 cigarettes/day  Vaping Use   Vaping status: Never Used  Substance and Sexual Activity   Alcohol use: Yes    Alcohol/week: 1.0 standard drink of alcohol    Types: 1 Standard drinks or equivalent per week    Comment: wine  cooler   Drug use: Not Currently    Types: Marijuana   Sexual activity: Not on file  Other Topics Concern   Not on file  Social History Narrative   Lives alone.  One daughter.    Social Drivers of Corporate investment banker Strain: Not on file  Food Insecurity: Food Insecurity Present (04/15/2024)   Hunger Vital Sign    Worried About Running Out of Food in the Last Year: Sometimes true    Ran Out of Food in the Last Year: Sometimes true  Transportation Needs: No Transportation Needs (04/15/2024)   PRAPARE - Administrator, Civil Service (Medical): No    Lack of Transportation (Non-Medical): No  Physical Activity: Not on file  Stress: Not on file  Social Connections: Socially Isolated (04/15/2024)   Social Connection and Isolation Panel [NHANES]  Frequency of Communication with Friends and Family: Once a week    Frequency of Social Gatherings with Friends and Family: Once a week    Attends Religious Services: Never    Database administrator or Organizations: No    Attends Banker Meetings: Never    Marital Status: Never married  Intimate Partner Violence: Not At Risk (04/15/2024)   Humiliation, Afraid, Rape, and Kick questionnaire    Fear of Current or Ex-Partner: No    Emotionally Abused: No    Physically Abused: No    Sexually Abused: No    Family History:   Family History  Problem Relation Age of Onset   Diabetes Mother    Colon cancer Neg Hx    Colon polyps Neg Hx    Esophageal cancer Neg Hx    Rectal cancer Neg Hx    Stomach cancer Neg Hx      ROS:  Please see the history of present illness.  All other ROS reviewed and negative.     Physical Exam/Data:   Vitals:   04/30/24 1958 04/30/24 2004 04/30/24 2230 04/30/24 2329  BP:  (!) 150/93 (!) 150/135 (!) 155/92  Pulse: 76 76 77   Resp:  16 (!) 25   Temp:  97.6 F (36.4 C)    TempSrc:  Oral    SpO2: 98% 93% 98%    No intake or output data in the 24 hours ending 05/01/24 0006     04/20/2024   12:26 PM 04/15/2024   11:13 AM 04/03/2024    3:31 AM  Last 3 Weights  Weight (lbs) 138 lb 14.4 oz 132 lb 4.4 oz 134 lb 4.8 oz  Weight (kg) 63.005 kg 60 kg 60.918 kg     There is no height or weight on file to calculate BMI.  General:  Well nourished, well developed, in no acute distress HEENT: normal Neck: JVD 8cm Vascular: No carotid bruits; Distal pulses 2+ bilaterally Cardiac:  3/6 diastolic murmur heard best at RUSB, regular rate/rhythm  Lungs:  clear to auscultation bilaterally, no wheezing, rhonchi or rales  Abd: soft, nontender, no hepatomegaly  Ext: no edema Musculoskeletal:  No deformities, BUE and BLE strength normal and equal Skin: warm and dry  Neuro:   no focal abnormalities noted Psych:  Normal affect    Relevant CV Studies: TTE 04/23/24  1. Left ventricular ejection fraction, by estimation, is 60 to 65%. The left ventricle has normal function. The left ventricle has no regional wall motion abnormalities. There is mild concentric left ventricular hypertrophy.   2. Right ventricular systolic function is normal. The right ventricular size is normal.   3. Left atrial size was severely dilated.   4. MR directed more posterior into LA Vena contracta width 0.7 . Moderate to severe mitral valve regurgitation.   5. Aortic valve regurgitation is moderate.   6. Dissection flap in descending aorta seen (known).    Laboratory Data:  High Sensitivity Troponin:   Recent Labs  Lab 04/15/24 1159 04/15/24 1423 04/30/24 1616 04/30/24 1835  TROPONINIHS 34* 31* 20* 19*     Chemistry Recent Labs  Lab 04/30/24 1616  NA 143  K 4.0  CL 114*  CO2 20*  GLUCOSE 105*  BUN 34*  CREATININE 3.55*  CALCIUM  8.9  GFRNONAA 13*  ANIONGAP 9    Recent Labs  Lab 04/30/24 1616  PROT 6.4*  ALBUMIN  3.3*  AST 16  ALT 13  ALKPHOS 50  BILITOT 0.7  Lipids No results for input(s): "CHOL", "TRIG", "HDL", "LABVLDL", "LDLCALC", "CHOLHDL" in the last 168 hours.   Hematology Recent Labs  Lab 04/30/24 1616  WBC 6.0  RBC 2.87*  HGB 9.3*  HCT 29.8*  MCV 103.8*  MCH 32.4  MCHC 31.2  RDW 13.1  PLT 137*   Thyroid No results for input(s): "TSH", "FREET4" in the last 168 hours.  BNP Recent Labs  Lab 04/30/24 1620  BNP 3,584.8*    DDimer No results for input(s): "DDIMER" in the last 168 hours.   Radiology/Studies:  DG Chest Port 1 View Result Date: 04/30/2024 CLINICAL DATA:  Shortness of breath. EXAM: PORTABLE CHEST 1 VIEW COMPARISON:  04/15/2024 FINDINGS: Anterior lordotic positioning with low lung volumes. Prior median sternotomy. Cardiomegaly with stable aortic tortuosity. Increased peribronchial and interstitial thickening suspicious for pulmonary edema. There are small bilateral pleural effusions, increased from prior exam. No pneumothorax. IMPRESSION: Cardiomegaly with pulmonary edema and small bilateral pleural effusions. Electronically Signed   By: Chadwick Colonel M.D.   On: 04/30/2024 17:47     Assessment and Plan:   #Acute on chronic diastolic HF exacerbation #Hx Aortic dissection s/p repair with dissection flap #Moderate-severe MR #Moderate AR Patient with recent presentation for shortness of breath iso known moderate AR, mod-severe AR presented with recurrent/worsening shortness of breath with orthopnea, PND. Initial labs notable for BNP 3584 (slightly more elevated compared to prior admission), though CXR demonstrated pulmonary edema. Most recent TTE on 04/23/24 demonstrated  LVEF 60-65% without WMAs; mild concentric LVH, RVSF wnl, LA severely dilated, mod-severe MR (posteriorly directed), moderate aortic valve regurgitation, known dissection flap in the descending aorta. Patient was given IV lasix  in the ER with good UOP and improvement of her shortness of breath. Given known hx of dissection with c/f dissection flap, would recommend more aggressive BP control and ongoing CTS evaluation.  --Goal SBP <140 --continue asa  daily --continue amlodipine  10mg  every day --continue atorvastatin  40mg  every day --continue carvedilol  25mg  bid --continue hydralazine  100mg  bid --consider CTS evaluation Diuresis --Strict I/Os, daily weights --K~4, Mag~2 --agree with IV lasix  for diuresis,  --supplemental O2 for goal O2 sat >92%   Risk Assessment/Risk Scores:  [place]  Heart Association (NYHA) Functional Class NYHA Class III   For questions or updates, please contact [facility] Please consult [email or web address] for contact info under   Signed, Milbert [place], MD  05/01/2024 12:06 AM

## 2024-05-01 ENCOUNTER — Other Ambulatory Visit (HOSPITAL_COMMUNITY): Payer: Self-pay

## 2024-05-01 DIAGNOSIS — I5031 Acute diastolic (congestive) heart failure: Secondary | ICD-10-CM | POA: Diagnosis not present

## 2024-05-01 DIAGNOSIS — I340 Nonrheumatic mitral (valve) insufficiency: Secondary | ICD-10-CM | POA: Diagnosis not present

## 2024-05-01 DIAGNOSIS — J810 Acute pulmonary edema: Secondary | ICD-10-CM | POA: Diagnosis not present

## 2024-05-01 DIAGNOSIS — N185 Chronic kidney disease, stage 5: Secondary | ICD-10-CM | POA: Diagnosis not present

## 2024-05-01 DIAGNOSIS — I10 Essential (primary) hypertension: Secondary | ICD-10-CM | POA: Diagnosis not present

## 2024-05-01 DIAGNOSIS — Z8679 Personal history of other diseases of the circulatory system: Secondary | ICD-10-CM

## 2024-05-01 LAB — HEMOGLOBIN A1C
Hgb A1c MFr Bld: 4.5 % — ABNORMAL LOW (ref 4.8–5.6)
Mean Plasma Glucose: 82.45 mg/dL

## 2024-05-01 LAB — CBC
HCT: 26.7 % — ABNORMAL LOW (ref 36.0–46.0)
Hemoglobin: 8.6 g/dL — ABNORMAL LOW (ref 12.0–15.0)
MCH: 33.1 pg (ref 26.0–34.0)
MCHC: 32.2 g/dL (ref 30.0–36.0)
MCV: 102.7 fL — ABNORMAL HIGH (ref 80.0–100.0)
Platelets: 124 10*3/uL — ABNORMAL LOW (ref 150–400)
RBC: 2.6 MIL/uL — ABNORMAL LOW (ref 3.87–5.11)
RDW: 13 % (ref 11.5–15.5)
WBC: 6.3 10*3/uL (ref 4.0–10.5)
nRBC: 0 % (ref 0.0–0.2)

## 2024-05-01 LAB — BASIC METABOLIC PANEL WITH GFR
Anion gap: 9 (ref 5–15)
BUN: 37 mg/dL — ABNORMAL HIGH (ref 8–23)
CO2: 20 mmol/L — ABNORMAL LOW (ref 22–32)
Calcium: 8.4 mg/dL — ABNORMAL LOW (ref 8.9–10.3)
Chloride: 112 mmol/L — ABNORMAL HIGH (ref 98–111)
Creatinine, Ser: 3.6 mg/dL — ABNORMAL HIGH (ref 0.44–1.00)
GFR, Estimated: 13 mL/min — ABNORMAL LOW (ref >60)
Glucose, Bld: 95 mg/dL (ref 70–99)
Potassium: 3.7 mmol/L (ref 3.5–5.1)
Sodium: 141 mmol/L (ref 135–145)

## 2024-05-01 LAB — CBG MONITORING, ED: Glucose-Capillary: 122 mg/dL — ABNORMAL HIGH (ref 70–99)

## 2024-05-01 LAB — TSH: TSH: 3.163 u[IU]/mL (ref 0.350–4.500)

## 2024-05-01 MED ORDER — FUROSEMIDE 10 MG/ML IJ SOLN
40.0000 mg | Freq: Two times a day (BID) | INTRAMUSCULAR | Status: DC
  Administered 2024-05-01 (×2): 40 mg via INTRAVENOUS
  Filled 2024-05-01 (×2): qty 4

## 2024-05-01 MED ORDER — FUROSEMIDE 20 MG PO TABS
40.0000 mg | ORAL_TABLET | Freq: Every day | ORAL | Status: DC
  Administered 2024-05-01: 40 mg via ORAL
  Filled 2024-05-01: qty 2

## 2024-05-01 MED ORDER — FUROSEMIDE 40 MG PO TABS
40.0000 mg | ORAL_TABLET | Freq: Every day | ORAL | 1 refills | Status: AC
  Filled 2024-05-01: qty 30, 30d supply, fill #0

## 2024-05-01 MED ORDER — ALBUTEROL SULFATE (2.5 MG/3ML) 0.083% IN NEBU
2.5000 mg | INHALATION_SOLUTION | RESPIRATORY_TRACT | Status: DC | PRN
  Administered 2024-05-01: 2.5 mg via RESPIRATORY_TRACT
  Filled 2024-05-01: qty 3

## 2024-05-01 NOTE — Care Management CC44 (Signed)
 Condition Code 44 Documentation Completed  Patient Details  Name: NETANYA YAZDANI MRN: [id number] Date of Birth: 07-22-1956   Condition Code 44 given:  Yes Patient signature on Condition Code 44 notice:  Yes Documentation of 2 MD's agreement:  Yes Code 44 added to claim:  Yes    Joanette Moynahan, RN 05/01/2024, 3:10 PM

## 2024-05-01 NOTE — Progress Notes (Signed)
 PROGRESS NOTE    Mary Stephens  ZDG:[id number] DOB: 07-Feb-1956 DOA: 04/30/2024 PCP: System, Provider Not In  Chief Complaint  Patient presents with   Abdominal Pain   Shortness of Breath    Brief Narrative:   68 yo with hx CKD V, hx aortic dissection s/p repair, HTN, HLD, severe MR and moderate AR presenting with SOB and orthopnea.    She's improved with lasix .  Cardiology following.   Assessment & Plan:   Principal Problem:   Chest pain Active Problems:   Essential hypertension   S/P ascending aortic replacement   CKD (chronic kidney disease) stage 4, GFR 15-29 ml/min (HCC)   Anemia due to chronic kidney disease   Mitral valve regurgitation   SOB (shortness of breath)  Shortness of Breath Pulmonary Edema  Small Bilateral Effusions Severe Mitral Valve Regurgitation  Moderate AV Regurgitation  HFpEF with Exacerbation Presented with SOB, she's now improved after lasix  (this isn't [facility] home med) CXR with pulmonary edema and small effusions Will continue lasix  for now Strict I/O, daily weights Appreciate cardiology assistance  5/9 CT surgery note, looks like she's planning to follow up with [facility] for second opinion on valvular disease  Hx Aortic Dissection s/p Repair 2021 Dissection flap in ascending aorta Tight BP control   CKD Stage V Doesn't sound like she's ever seen outpatient doc Needs outpatient renal follow up Her renal function is at baseline, monitor with diuresis   HTN Hydral, coreg , amlodipine  Diuresis with lasix   T2DM A1c 4.5, no need for any medications  Chronic Hep C 2016 HCV quant not detected, treated or cleared it appears  Tobacco Abuse     DVT prophylaxis: heparin   Code Status: full Family Communication: none Disposition:   Status is: Inpatient Remains inpatient appropriate because: ongoing cardiology care   Consultants:  Cardiology  Procedures:  none  Antimicrobials:  Anti-infectives (From admission, onward)    None        Subjective: Feeling better  Objective: Vitals:   05/01/24 0900 05/01/24 0915 05/01/24 0924 05/01/24 0925  BP:    137/86  Pulse: 75 77  93  Resp: (!) 21 15  18   Temp:   98.5 F (36.9 C)   TempSrc:   Oral   SpO2: 97% 95%  100%   No intake or output data in the 24 hours ending 05/01/24 0943 There were no vitals filed for this visit.  Examination:  General exam: Appears calm and comfortable  Respiratory system: Clear to auscultation. Respiratory effort normal. Cardiovascular system: systolic murmur, RRR Gastrointestinal system: Abdomen is nondistended, soft and nontender.  Central nervous system: Alert and oriented. No focal neurological deficits. Extremities: no LEE   Data Reviewed: I have personally reviewed following labs and imaging studies  CBC: Recent Labs  Lab 04/30/24 1616 05/01/24 0513  WBC 6.0 6.3  HGB 9.3* 8.6*  HCT 29.8* 26.7*  MCV 103.8* 102.7*  PLT 137* 124*    Basic Metabolic Panel: Recent Labs  Lab 04/30/24 1616 05/01/24 0513  NA 143 141  K 4.0 3.7  CL 114* 112*  CO2 20* 20*  GLUCOSE 105* 95  BUN 34* 37*  CREATININE 3.55* 3.60*  CALCIUM  8.9 8.4*    GFR: Estimated Creatinine Clearance: 14.5 mL/min (Judeth Gilles) (by C-G formula based on SCr of 3.6 mg/dL (H)).  Liver Function Tests: Recent Labs  Lab 04/30/24 1616  AST 16  ALT 13  ALKPHOS 50  BILITOT 0.7  PROT 6.4*  ALBUMIN  3.3*    CBG:  Recent Labs  Lab 05/01/24 0920  GLUCAP 122*     Recent Results (from the past 240 hours)  Resp panel by RT-PCR (RSV, Flu Avanelle Pixley&B, Covid) Anterior Nasal Swab     Status: None   Collection Time: 04/30/24  4:16 PM   Specimen: Anterior Nasal Swab  Result Value Ref Range Status   SARS Coronavirus 2 by RT PCR NEGATIVE NEGATIVE Final   Influenza Kallum Jorgensen by PCR NEGATIVE NEGATIVE Final   Influenza B by PCR NEGATIVE NEGATIVE Final    Comment: (NOTE) The Xpert Xpress SARS-CoV-2/FLU/RSV plus assay is intended as an aid in the diagnosis of influenza from  Nasopharyngeal swab specimens and should not be used as Eleuterio Dollar sole basis for treatment. Nasal washings and aspirates are unacceptable for Xpert Xpress SARS-CoV-2/FLU/RSV testing.  Fact Sheet for Patients: BloggerCourse.com  Fact Sheet for Healthcare Providers: SeriousBroker.it  This test is not yet approved or cleared by the [place]  FDA and has been authorized for detection and/or diagnosis of SARS-CoV-2 by FDA under an Emergency Use Authorization (EUA). This EUA will remain in effect (meaning this test can be used) for the duration of the COVID-19 declaration under Section 564(b)(1) of the Act, 21 U.S.C. section 360bbb-3(b)(1), unless the authorization is terminated or revoked.     Resp Syncytial Virus by PCR NEGATIVE NEGATIVE Final    Comment: (NOTE) Fact Sheet for Patients: BloggerCourse.com  Fact Sheet for Healthcare Providers: SeriousBroker.it  This test is not yet approved or cleared by the [place]  FDA and has been authorized for detection and/or diagnosis of SARS-CoV-2 by FDA under an Emergency Use Authorization (EUA). This EUA will remain in effect (meaning this test can be used) for the duration of the COVID-19 declaration under Section 564(b)(1) of the Act, 21 U.S.C. section 360bbb-3(b)(1), unless the authorization is terminated or revoked.  Performed at [facility], [address], [place], [place] [address]          Radiology Studies: DG Chest Port 1 View Result Date: 04/30/2024 CLINICAL DATA:  Shortness of breath. EXAM: PORTABLE CHEST 1 VIEW COMPARISON:  04/15/2024 FINDINGS: Anterior lordotic positioning with low lung volumes. Prior median sternotomy. Cardiomegaly with stable aortic tortuosity. Increased peribronchial and interstitial thickening suspicious for pulmonary edema. There are small bilateral pleural effusions, increased from prior  exam. No pneumothorax. IMPRESSION: Cardiomegaly with pulmonary edema and small bilateral pleural effusions. Electronically Signed   By: Chadwick Colonel M.D.   On: 04/30/2024 17:47        Scheduled Meds:  amLODipine   10 mg Oral Daily   aspirin  EC  325 mg Oral Daily   atorvastatin   40 mg Oral QHS   carvedilol   25 mg Oral BID WC   furosemide   40 mg Intravenous BID   heparin   5,000 Units Subcutaneous Q8H   hydrALAZINE   100 mg Oral TID   Continuous Infusions:   LOS: 1 day    Time spent: over 30 min    Donnetta Gains, MD [facility]   To contact the attending provider between 7A-7P or the covering provider during after hours 7P-7A, please log into the web site [email or web address] and access using universal [place] password for that web site. If you do not have the password, please call the hospital operator.  05/01/2024, 9:43 AM

## 2024-05-01 NOTE — Progress Notes (Signed)
 Rounding Note    Patient Name: Mary Stephens Date of Encounter: 05/01/2024  [facility] Cardiologist: Eilleen Grates, MD   Subjective   Feeling much better, no longer having orthopnea. Reports many trips to bathroom to urinate, not captured in chart.   We reviewed her recent hospitalization and workup. She was unable to have TEE as not even pediatric probe was able to pass, and therefore we have not been able to get full picture of her valve disease. She recalls her conversation with Dr. Deloise Ferries and the potential risk of dialysis with surgery. She discussed with her daughter--originally she wanted to get a surgical opinion at [facility], and now she is asking for a referral to CT surgery at [facility].   We also discussed the importance of blood pressure management. She is unsure if she has seen a nephrologist yet--she had a visit at an office near [facility] but doesn't recall what it is for.   We reviewed symptoms of heart failure, watching for fluid accumulation. She was not on diuretic at home. Has had a good response to IV lasix , discussed changing to oral and monitoring output today. Reports chest tightness only when taking a deep breath.  Inpatient Medications    Scheduled Meds:  amLODipine   10 mg Oral Daily   aspirin  EC  325 mg Oral Daily   atorvastatin   40 mg Oral QHS   carvedilol   25 mg Oral BID WC   furosemide   40 mg Oral Daily   heparin   5,000 Units Subcutaneous Q8H   hydrALAZINE   100 mg Oral TID   Continuous Infusions:  PRN Meds: acetaminophen  **OR** acetaminophen , albuterol , hydrALAZINE , ondansetron  **OR** ondansetron  (ZOFRAN ) IV, sorbitol    Vital Signs    Vitals:   05/01/24 0900 05/01/24 0915 05/01/24 0924 05/01/24 0925  BP:    137/86  Pulse: 75 77  93  Resp: (!) 21 15  18   Temp:   98.5 F (36.9 C)   TempSrc:   Oral   SpO2: 97% 95%  100%    Intake/Output Summary (Last 24 hours) at 05/01/2024 1202 Last data filed at 05/01/2024 1100 Gross per 24 hour   Intake --  Output 300 ml  Net -300 ml      04/20/2024   12:26 PM 04/15/2024   11:13 AM 04/03/2024    3:31 AM  Last 3 Weights  Weight (lbs) 138 lb 14.4 oz 132 lb 4.4 oz 134 lb 4.8 oz  Weight (kg) 63.005 kg 60 kg 60.918 kg      Telemetry    SR - Personally Reviewed  Physical Exam   GEN: No acute distress.   Neck: No JVD sitting upright Cardiac: RRR,  2/6 holosystolic murmur best heard in the back, did not appreciate diastolic murmur today Respiratory: Clear to auscultation bilaterally except for mild coarseness at L base GI: Soft, nontender, non-distended  MS: No edema; No deformity. Neuro:  Nonfocal  Psych: Normal affect   New pertinent results (labs, ECG, imaging, cardiac studies)    None this admission; reviewed prior workup extensively  Assessment & Plan    Acute on chronic diastolic heart failure Chronic kidney disease, stage 5 Moderate aortic regurgitation Moderate to severe eccentric mitral regurgitation -all of the above likely contributing to her readmission. We discussed this at length today. She was unable to have TEE as no probe passed (including pediatric), but concern is for severe MR.  -she was not on diuretic at home. Unclear if she saw nephrology as  outpatient. Will start her on diuretic, will need close follow up both in terms of response to dose (may need to increase given her renal dysfunction) and her Cr/K -on afterload reducing agents -she has met Dr. Deloise Ferries here, who had honest discussion about the risk of needing dialysis if CT surgery pursued for her valves. She has pending f/u at [facility] but would also like outpatient referral to [facility] CT surgery.  -discussed that she has not been able to have full pre-surgical workup. Could not get probe to pass for TEE. High risk for cardiac cath given renal function. She would like to discuss this further with the [facility] team.  Hypertension History of aortic dissection s/p repair 2021 -complicated by chronic  kidney disease, as above -on hydralazine , amlodipine , and carvedilol  max doses -starting furosemide  as above  Discussed with Dr. Ada Acres. If she has good urine output on oral lasix , she is ok for discharge from cards perspective. She already has follow up with [facility]eng on 5/29. We will start referral process to [facility] for CT surgery (see above, have discussed that she has not had complete workup yet). Appreciate assistance in getting her outpatient nephrology follow up as well.    Signed, Sheryle Donning, MD  05/01/2024, 12:02 PM

## 2024-05-01 NOTE — ED Notes (Signed)
 Remains alert, NAD, calm, interactive, ambulatory with multiple trips to b/r to void, diuresing well. Denies sx or complaints, VSS. States, 'feel better, breathing easier". Report given to Yellow POD RN. Pt moved to yellow 44. Pending inpt bed assignment.

## 2024-05-01 NOTE — Care Management Obs Status (Signed)
 MEDICARE OBSERVATION STATUS NOTIFICATION   Patient Details  Name: SOLITA MACADAM MRN: [id number] Date of Birth: [birth year]   Medicare Observation Status Notification Given:  Yes    Joanette Moynahan, RN 05/01/2024, 3:09 PM

## 2024-05-01 NOTE — ED Notes (Signed)
 Pt ambulatory to restroom with no assistance.

## 2024-05-01 NOTE — Discharge Summary (Signed)
 Physician Discharge Summary  Mary Stephens UEA:[id number] DOB: 03/20/1956 DOA: 04/30/2024  PCP: System, Provider Not In  Admit date: 04/30/2024 Discharge date: 05/01/2024  Time spent: 40 minutes  Recommendations for Outpatient Follow-up:  Follow outpatient CBC/CMP  Follow volume status outpatient, follow lytes/kidney function outpatient in Mary Stephens week or so (after starting lasix ) Needs cards follow up Needs renal follow up for CKD V   Discharge Diagnoses:  Principal Problem:   Chest pain Active Problems:   Essential hypertension   S/P ascending aortic replacement   CKD (chronic kidney disease) stage 4, GFR 15-29 ml/min (HCC)   Anemia due to chronic kidney disease   Mitral valve regurgitation   SOB (shortness of breath)   Acute pulmonary edema (HCC)   Acute diastolic heart failure (HCC)   CKD (chronic kidney disease) stage 5, GFR less than 15 ml/min (HCC)   History of aortic dissection   Discharge Condition: stable  Diet recommendation: heart healthy  There were no vitals filed for this visit.  History of present illness:   68 yo with hx CKD V, hx aortic dissection s/p repair, HTN, HLD, severe MR and moderate AR presenting with SOB and orthopnea.     She's improved with lasix .  Cardiology following.  Made good urine with PO lasix  today.  Stable for discharge.  Will discharge with PO lasix  and outpatient cardiology, renal, CT surgery follow up.   Hospital Course:  Assessment and Plan:  Shortness of Breath Pulmonary Edema  Small Bilateral Effusions Severe Mitral Valve Regurgitation  Moderate AV Regurgitation  HFpEF with Exacerbation Presented with SOB, she's now improved after lasix  (this isn't Mary Stephens home med) CXR with pulmonary edema and small effusions Back to baseline after IV lasix .  She's urinated over 700 cc after PO lasix .  Will discharge on PO lasix  daily.   Strict I/O, daily weights Appreciate cardiology assistance - if good UOP, ok for discharge.  Has follow up 5/29  with cards.   5/9 CT surgery note, looks like she's planning to follow up with [facility] for second opinion on valvular disease.  Cards going to start referral to [facility] CT surgery.   Hx Aortic Dissection s/p Repair 2021 Dissection flap in ascending aorta Tight BP control    CKD Stage V Doesn't sound like she's ever seen outpatient doc Needs outpatient renal follow up Her renal function is at baseline, monitor with diuresis  Sent message to renal on call, they'll help arrange follow up   HTN Hydral, coreg , amlodipine  lasix    T2DM A1c 4.5, no need for any medications   Chronic Hep C 2016 HCV quant not detected, treated or cleared it appears   Tobacco Abuse     Procedures: none   Consultations: cardiology  Discharge Exam: Vitals:   05/01/24 0925 05/01/24 1000  BP: 137/86 139/84  Pulse: 93 74  Resp: 18 19  Temp:    SpO2: 100% 95%   See progress note  Discharge Instructions   Discharge Instructions     (HEART FAILURE PATIENTS) Call MD:  Anytime you have any of the following symptoms: 1) 3 pound weight gain in 24 hours or 5 pounds in 1 week 2) shortness of breath, with or without 70[place] Reish dry hacking cough 3) swelling in the hands, feet or stomach 4) if you have to sleep on extra pillows at night in order to breathe.   Complete by: As directed    Call MD for:  difficulty breathing, headache or visual disturbances   Complete by:  As directed    Call MD for:  extreme fatigue   Complete by: As directed    Call MD for:  hives   Complete by: As directed    Call MD for:  persistant dizziness or light-headedness   Complete by: As directed    Call MD for:  persistant nausea and vomiting   Complete by: As directed    Call MD for:  redness, tenderness, or signs of infection (pain, swelling, redness, odor or green/yellow discharge around incision site)   Complete by: As directed    Call MD for:  severe uncontrolled pain   Complete by: As directed    Call MD for:  temperature >100.4    Complete by: As directed    Diet - low sodium heart healthy   Complete by: As directed    Discharge instructions   Complete by: As directed    You were admitted with shortness of breath with pulmonary edema (fluid on your lungs).  You improved after lasix .  We'll start you on lasix  daily at home.  Check your weights daily.  Call your PCP if your weight increases by 2-3 lbs in 70 day or by 5 lbs in 70 week.  You'll need to follow up with cardiology as scheduled.  You have advanced kidney failure and need to see the kidney doctors outpatient.  We'll try to arrange outpatient follow up with you with [facility].  If you don't hear from them, please call.    Follow up with the cardiothoracic surgeons as planned.  Return for new, recurrent, or worsening symptoms.  Please ask your PCP to request records from this hospitalization so they know what was done and what the next steps will be.   Increase activity slowly   Complete by: As directed       Allergies as of 05/01/2024   No Known Allergies      Medication List     TAKE these medications    acetaminophen  500 MG tablet Commonly known as: TYLENOL  Take 500-1,000 mg by mouth every 8 (eight) hours as needed (for pain).   amLODipine  10 MG tablet Commonly known as: NORVASC  Take 1 tablet (10 mg total) by mouth daily.   aspirin  EC 325 MG tablet Take 1 tablet (325 mg total) by mouth daily.   atorvastatin  40 MG tablet Commonly known as: LIPITOR Take 40 mg by mouth at bedtime.   carvedilol  25 MG tablet Commonly known as: COREG  Take 1 tablet (25 mg total) by mouth 2 (two) times daily with Mary Stephens meal.   furosemide  40 MG tablet Commonly known as: LASIX  Take 1 tablet (40 mg total) by mouth daily. Start taking on: May 02, 2024   hydrALAZINE  100 MG tablet Commonly known as: APRESOLINE  Take 1 tablet (100 mg total) by mouth 3 (three) times daily.   nicotine  21 mg/24hr patch Commonly known as: NICODERM CQ  - dosed in  mg/24 hours Place 21 mg onto the skin daily as needed (for smoking cessation).       No Known Allergies    The results of significant diagnostics from this hospitalization (including imaging, microbiology, ancillary and laboratory) are listed below for reference.    Significant Diagnostic Studies: DG Chest Port 1 View Result Date: 04/30/2024 CLINICAL DATA:  Shortness of breath. EXAM: PORTABLE CHEST 1 VIEW COMPARISON:  04/15/2024 FINDINGS: Anterior lordotic positioning with low lung volumes. Prior median sternotomy. Cardiomegaly with stable aortic tortuosity. Increased peribronchial and interstitial thickening suspicious for pulmonary edema.  There are small bilateral pleural effusions, increased from prior exam. No pneumothorax. IMPRESSION: Cardiomegaly with pulmonary edema and small bilateral pleural effusions. Electronically Signed   By: Chadwick Colonel M.D.   On: 04/30/2024 17:47   ECHOCARDIOGRAM LIMITED Result Date: 04/23/2024    ECHOCARDIOGRAM LIMITED REPORT   Patient Name:   Mary Stephens Date of Exam: 04/23/2024 Medical Rec #:  [id number]      Height:       68.0 in Accession #:    [id number]     Weight:       138.9 lb Date of Birth:  September 15, 1956      BSA:          1.750 m Patient Age:    68 years       BP:           123/77 mmHg Patient Gender: F              HR:           66 bpm. Exam Location:  [address] Procedure: Limited Echo, Limited Color Doppler and Cardiac Doppler (Both            Spectral and Color Flow Doppler were utilized during procedure). Indications:    Mitral valve insufficiency I34.0  History:        Patient has prior history of Echocardiogram examinations, most                 recent 04/15/2024. Known descending aortic dissection; Risk                 Factors:Diabetes. _ Aorta Repair of Acute Ascending Thoracic                 Aortic Dissection; Date:12/02/2020 Graft Type: Hemashield                 Platinum 28mm Vascular Graft.  Sonographer:    Joleen Navy RDCS  Referring Phys: [id number] HAO MENG IMPRESSIONS  1. Left ventricular ejection fraction, by estimation, is 60 to 65%. The left ventricle has normal function. The left ventricle has no regional wall motion abnormalities. There is mild concentric left ventricular hypertrophy.  2. Right ventricular systolic function is normal. The right ventricular size is normal.  3. Left atrial size was severely dilated.  4. MR directed more posterior into LA Vena contracta width 0.7 . Moderate to severe mitral valve regurgitation.  5. Aortic valve regurgitation is moderate.  6. Dissection flap in descending aorta seen (known). FINDINGS  Left Ventricle: Left ventricular ejection fraction, by estimation, is 60 to 65%. The left ventricle has normal function. The left ventricle has no regional wall motion abnormalities. The left ventricular internal cavity size was normal in size. There is  mild concentric left ventricular hypertrophy. Right Ventricle: The right ventricular size is normal. Right vetricular wall thickness was not assessed. Right ventricular systolic function is normal. Left Atrium: Left atrial size was severely dilated. Pericardium: Trivial pericardial effusion is present. Mitral Valve: MR directed more posterior into LA Vena contracta width 0.7. Moderate to severe mitral valve regurgitation. Tricuspid Valve: The tricuspid valve is normal in structure. Tricuspid valve regurgitation is mild. Aortic Valve: Aortic valve regurgitation is moderate. Aortic regurgitation PHT measures 409 msec. Aorta: Dissection flap in descending aorta seen (known). The aortic root and ascending aorta are structurally normal, with no evidence of dilitation. LEFT VENTRICLE PLAX 2D LVIDd:         5.40 cm LVIDs:  3.60 cm LV PW:         1.40 cm LV IVS:        1.40 cm LVOT diam:     2.10 cm LV SV:         79 LV SV Index:   45 LVOT Area:     3.46 cm  LEFT ATRIUM            Index LA diam:      4.40 cm  2.51 cm/m LA Vol (A4C): 107.0 ml 61.13 ml/m   AORTIC VALVE LVOT Vmax:   108.00 cm/s LVOT Vmean:  67.100 cm/s LVOT VTI:    0.228 m AI PHT:      409 msec  AORTA Ao Root diam: 3.30 cm Ao Asc diam:  3.10 cm MITRAL VALVE                  TRICUSPID VALVE MV Area (PHT): 5.38 cm       TR Peak grad:   22.8 mmHg MV Decel Time: 141 msec       TR Vmax:        239.00 cm/s MR Peak grad:    119.7 mmHg MR Mean grad:    84.0 mmHg    SHUNTS MR Vmax:         547.00 cm/s  Systemic VTI:  0.23 m MR Vmean:        435.0 cm/s   Systemic Diam: 2.10 cm MR PISA:         2.65 cm MR PISA Eff ROA: 15 mm MR PISA Radius:  0.65 cm MV E velocity: 88.00 cm/s MV Amabel Stmarie velocity: 49.30 cm/s MV E/Milanya Sunderland ratio:  1.78 Ola Berger MD Electronically signed by Ola Berger MD Signature Date/Time: 04/23/2024/4:38:43 PM    Final    EP STUDY Result Date: 04/17/2024 See surgical note for result.  ABORTED INVASIVE LAB PROCEDURE Result Date: 04/17/2024 See surgical note for result.  NM Pulmonary Perfusion Result Date: 04/16/2024 CLINICAL DATA:  Concern for pulmonary embolism.  Hypoxemia EXAM: NUCLEAR MEDICINE PERFUSION LUNG SCAN TECHNIQUE: Perfusion images were obtained in multiple projections after intravenous injection of radiopharmaceutical. RADIOPHARMACEUTICALS:  4.3 mCi Tc-41m MAA COMPARISON:  Chest radiograph 04/15/2024 FINDINGS: No wedge-shaped peripheral perfusion defect within LEFT or RIGHT lung to suggest acute pulmonary embolism. Normal perfusion pattern. IMPRESSION: No evidence acute pulmonary embolism. Electronically Signed   By: Deboraha Fallow M.D.   On: 04/16/2024 11:04   ECHOCARDIOGRAM LIMITED Result Date: 04/15/2024    ECHOCARDIOGRAM LIMITED REPORT   Patient Name:   Mary Stephens Date of Exam: 04/15/2024 Medical Rec #:  [id number]      Height:       68.0 in Accession #:    [id number]     Weight:       132.3 lb Date of Birth:  1956-11-26      BSA:          1.714 m Patient Age:    68 years       BP:           160/100 mmHg Patient Gender: F              HR:           64 bpm. Exam Location:   Inpatient Procedure: Limited Echo, Cardiac Doppler and Color Doppler (Both Spectral and            Color Flow Doppler were utilized during procedure). Indications:    R07.9* Chest  pain, unspecified; R06.02 SOB  History:        Patient has prior history of Echocardiogram examinations, most                 recent 03/14/2024. Aortic Valve Disease, Signs/Symptoms:Dyspnea                 and Shortness of Breath; Risk Factors:Current Smoker,                 Dyslipidemia, Hypertension and Diabetes. Ascending aorta                 replacement.  Sonographer:    Raynelle Callow RDCS Referring Phys: [id number] [facility]  Sonographer Comments: Technically difficult study due to poor echo windows. IMPRESSIONS  1. Left ventricular ejection fraction, by estimation, is 60 to 65%. The left ventricle has normal function. The left ventricle has no regional wall motion abnormalities. There is severe left ventricular hypertrophy.  2. Right ventricular systolic function is normal. The right ventricular size is normal. There is moderately elevated pulmonary artery systolic pressure. The estimated right ventricular systolic pressure is 59.1 mmHg.  3. The MR vena contracta is 0.6 cm. . Moderate to severe mitral valve regurgitation. No evidence of mitral stenosis.  4. The tricuspid valve is abnormal. Tricuspid valve regurgitation is moderate.  5. The aortic valve is tricuspid. Aortic valve regurgitation is moderate.  6. The inferior vena cava is dilated in size with <50% respiratory variability, suggesting right atrial pressure of 15 mmHg. FINDINGS  Left Ventricle: Left ventricular ejection fraction, by estimation, is 60 to 65%. The left ventricle has normal function. The left ventricle has no regional wall motion abnormalities. There is severe left ventricular hypertrophy. Right Ventricle: The right ventricular size is normal. Right vetricular wall thickness was not well visualized. Right ventricular systolic function is normal. There is  moderately elevated pulmonary artery systolic pressure. The tricuspid regurgitant velocity is 3.32 m/s, and with an assumed right atrial pressure of 15 mmHg, the estimated right ventricular systolic pressure is 59.1 mmHg. Mitral Valve: The MR vena contracta is 0.6 cm. Moderate to severe mitral valve regurgitation. No evidence of mitral valve stenosis. Tricuspid Valve: The tricuspid valve is abnormal. Tricuspid valve regurgitation is moderate . No evidence of tricuspid stenosis. Aortic Valve: The aortic valve is tricuspid. Aortic valve regurgitation is moderate. Aortic regurgitation PHT measures 384 msec. Pulmonic Valve: The pulmonic valve was not well visualized. Pulmonic valve regurgitation is not visualized. No evidence of pulmonic stenosis. Aorta: The aortic root and ascending aorta are structurally normal, with no evidence of dilitation. Venous: The inferior vena cava is dilated in size with less than 50% respiratory variability, suggesting right atrial pressure of 15 mmHg. Additional Comments: Spectral Doppler performed. Color Doppler performed.  LEFT VENTRICLE PLAX 2D LVIDd:         5.50 cm LVIDs:         3.70 cm LV PW:         1.60 cm LV IVS:        1.60 cm LVOT diam:     2.30 cm LVOT Area:     4.15 cm  LV Volumes (MOD) LV vol d, MOD A2C: 81.4 ml LV vol d, MOD A4C: 109.2 ml LV vol s, MOD A2C: 27.2 ml LV vol s, MOD A4C: 60.7 ml LV SV MOD A2C:     54.2 ml LV SV MOD A4C:     109.2 ml LV SV MOD BP:  52.7 ml IVC IVC diam: 2.40 cm AORTIC VALVE AI PHT:      384 msec  AORTA Ao Root diam: 3.00 cm Ao Asc diam:  3.40 cm MR Peak grad:    114.1 mmHg   TRICUSPID VALVE MR Mean grad:    78.0 mmHg    TR Peak grad:   44.1 mmHg MR Vmax:         534.00 cm/s  TR Vmax:        332.00 cm/s MR Vmean:        413.0 cm/s MR PISA:         2.26 cm     SHUNTS MR PISA Eff ROA: 16 mm       Systemic Diam: 2.30 cm MR PISA Radius:  0.60 cm Armida Lander MD Electronically signed by Armida Lander MD Signature Date/Time:  04/15/2024/3:57:47 PM    Final    DG Chest Port 1 View Result Date: 04/15/2024 CLINICAL DATA:  Chest pain, shortness of breath EXAM: PORTABLE CHEST - 1 VIEW COMPARISON:  03/31/2024 FINDINGS: Mild interstitial and alveolar opacities involving bases more than apices, right greater than left, increased from previous. No pneumothorax. Mild cardiomegaly.  Ectatic thoracic aorta. No effusion. Sternotomy wires. IMPRESSION: Mild interstitial and alveolar opacities, right greater than left. Electronically Signed   By: Nicoletta Barrier M.D.   On: 04/15/2024 12:57   CT Chest Wo Contrast Result Date: 04/15/2024 CLINICAL DATA:  Chest pain, shortness of breath EXAM: CT CHEST WITHOUT CONTRAST TECHNIQUE: Multidetector CT imaging of the chest was performed following the standard protocol without IV contrast. RADIATION DOSE REDUCTION: This exam was performed according to the departmental dose-optimization program which includes automated exposure control, adjustment of the mA and/or kV according to patient size and/or use of iterative reconstruction technique. COMPARISON:  03/31/2024 FINDINGS: Cardiovascular: Mild cardiomegaly with left-sided enlargement. Small pericardial effusion stable. Post ascending aorta repair with displaced intimal calcifications in the distal arch implying persistent dissection flap, as was noted on all postop studies back to 07/24/2021. estimated 4.3 cm dilatation of the proximal descending thoracic aorta, grossly stable by my measurement. Mediastinum/Nodes: No acute mediastinal hematoma, mass, or adenopathy. Lungs/Pleura: Trace pleural fluid right greater than left as before. No pneumothorax. Subpleural scarring and septal lines peripherally at the lung bases, right greater than left. Lungs otherwise clear. Upper Abdomen: Progressive left renal atrophy and cystic change since 12/02/2020. Displaced intimal calcifications in the abdominal aorta. No acute findings. Musculoskeletal: Sternotomy wires. IMPRESSION:  1. No acute findings. 2. Post ascending aorta repair with persistent dissection flap in the distal arch. 3. 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 4. Progressive left renal atrophy and cystic change since 12/02/2020. 5.  Aortic Atherosclerosis (ICD10-I70.0). Electronically Signed   By: Nicoletta Barrier M.D.   On: 04/15/2024 12:56   ECHOCARDIOGRAM COMPLETE Result Date: 04/02/2024    ECHOCARDIOGRAM REPORT   Patient Name:   Mary Stephens Date of Exam: 04/02/2024 Medical Rec #:  [id number]      Height:       68.0 in Accession #:    [id number]     Weight:       134.5 lb Date of Birth:  Aug 11, 1956      BSA:          1.727 m Patient Age:    68 years       BP:           141/94 mmHg Patient Gender: F  HR:           79 bpm. Exam Location:  Inpatient Procedure: 2D Echo, Color Doppler and Cardiac Doppler (Both Spectral and Color            Flow Doppler were utilized during procedure). Indications:    CHF I50.9  History:        Patient has prior history of Echocardiogram examinations, most                 recent 12/02/2020. Aortic Dissection repair 2021; Risk                 Factors:Diabetes, Dyslipidemia and Hypertension.  Sonographer:    Hersey Lorenzo RDCS Referring Phys: [id number] [facility] IMPRESSIONS  1. Left ventricular ejection fraction, by estimation, is 55%. The left ventricle has normal function. The left ventricle has no regional wall motion abnormalities. There is moderate concentric left ventricular hypertrophy. Left ventricular diastolic parameters are consistent with Grade I diastolic dysfunction (impaired relaxation).  2. Right ventricular systolic function is normal. The right ventricular size is normal. Tricuspid regurgitation signal is inadequate for assessing PA pressure.  3. Left atrial size was severely dilated.  4. The mitral valve is normal in structure. Mild mitral valve regurgitation. No evidence of mitral stenosis.  5. The aortic valve is tricuspid. There is mild calcification of  the aortic valve. Aortic valve regurgitation is mild. No aortic stenosis is present.  6. The inferior vena cava is normal in size with greater than 50% respiratory variability, suggesting right atrial pressure of 3 mmHg.  7. Mitzi Lilja small pericardial effusion is present.  8. Possible residual dissection visualized in the abdominal aorta. FINDINGS  Left Ventricle: Left ventricular ejection fraction, by estimation, is 55%. The left ventricle has normal function. The left ventricle has no regional wall motion abnormalities. The left ventricular internal cavity size was normal in size. There is moderate concentric left ventricular hypertrophy. Left ventricular diastolic parameters are consistent with Grade I diastolic dysfunction (impaired relaxation). Right Ventricle: The right ventricular size is normal. No increase in right ventricular wall thickness. Right ventricular systolic function is normal. Tricuspid regurgitation signal is inadequate for assessing PA pressure. Left Atrium: Left atrial size was severely dilated. Right Atrium: Right atrial size was normal in size. Pericardium: Jenine Krisher small pericardial effusion is present. Mitral Valve: The mitral valve is normal in structure. Mild mitral valve regurgitation. No evidence of mitral valve stenosis. Tricuspid Valve: The tricuspid valve is normal in structure. Tricuspid valve regurgitation is trivial. Aortic Valve: The aortic valve is tricuspid. There is mild calcification of the aortic valve. Aortic valve regurgitation is mild. Aortic regurgitation PHT measures 962 msec. No aortic stenosis is present. Pulmonic Valve: The pulmonic valve was normal in structure. Pulmonic valve regurgitation is not visualized. Aorta: The aortic root is normal in size and structure. Venous: The inferior vena cava is normal in size with greater than 50% respiratory variability, suggesting right atrial pressure of 3 mmHg. IAS/Shunts: No atrial level shunt detected by color flow Doppler.  LEFT  VENTRICLE PLAX 2D LVIDd:         5.10 cm   Diastology LVIDs:         3.20 cm   LV e' medial:    3.59 cm/s LV PW:         1.70 cm   LV E/e' medial:  16.0 LV IVS:        1.70 cm   LV e' lateral:   3.48 cm/s LVOT  diam:     2.10 cm   LV E/e' lateral: 16.5 LV SV:         69 LV SV Index:   40 LVOT Area:     3.46 cm  RIGHT VENTRICLE             IVC RV S prime:     11.50 cm/s  IVC diam: 1.40 cm TAPSE (M-mode): 1.3 cm LEFT ATRIUM              Index        RIGHT ATRIUM           Index LA diam:        3.70 cm  2.14 cm/m   RA Area:     15.30 cm LA Vol (A2C):   105.0 ml 60.81 ml/m  RA Volume:   37.80 ml  21.89 ml/m LA Vol (A4C):   93.0 ml  53.86 ml/m LA Biplane Vol: 109.0 ml 63.13 ml/m  AORTIC VALVE LVOT Vmax:   93.30 cm/s LVOT Vmean:  59.800 cm/s LVOT VTI:    0.200 m AI PHT:      962 msec  AORTA Ao Root diam: 3.40 cm Ao Asc diam:  2.90 cm MITRAL VALVE MV Area (PHT): 5.31 cm    SHUNTS MV Decel Time: 143 msec    Systemic VTI:  0.20 m MV E velocity: 57.40 cm/s  Systemic Diam: 2.10 cm MV Vencent Hauschild velocity: 67.70 cm/s MV E/Cassaundra Rasch ratio:  0.85 Dalton McleanMD Electronically signed by Archer Bear Signature Date/Time: 04/02/2024/9:21:39 AM    Final     Microbiology: Recent Results (from the past 240 hours)  Resp panel by RT-PCR (RSV, Flu Heli Dino&B, Covid) Anterior Nasal Swab     Status: None   Collection Time: 04/30/24  4:16 PM   Specimen: Anterior Nasal Swab  Result Value Ref Range Status   SARS Coronavirus 2 by RT PCR NEGATIVE NEGATIVE Final   Influenza Bernabe Dorce by PCR NEGATIVE NEGATIVE Final   Influenza B by PCR NEGATIVE NEGATIVE Final    Comment: (NOTE) The Xpert Xpress SARS-CoV-2/FLU/RSV plus assay is intended as an aid in the diagnosis of influenza from Nasopharyngeal swab specimens and should not be used as Carolyna Yerian sole basis for treatment. Nasal washings and aspirates are unacceptable for Xpert Xpress SARS-CoV-2/FLU/RSV testing.  Fact Sheet for Patients: BloggerCourse.com  Fact Sheet for Healthcare  Providers: SeriousBroker.it  This test is not yet approved or cleared by the [place]  FDA and has been authorized for detection and/or diagnosis of SARS-CoV-2 by FDA under an Emergency Use Authorization (EUA). This EUA will remain in effect (meaning this test can be used) for the duration of the COVID-19 declaration under Section 564(b)(1) of the Act, 21 U.S.C. section 360bbb-3(b)(1), unless the authorization is terminated or revoked.     Resp Syncytial Virus by PCR NEGATIVE NEGATIVE Final    Comment: (NOTE) Fact Sheet for Patients: BloggerCourse.com  Fact Sheet for Healthcare Providers: SeriousBroker.it  This test is not yet approved or cleared by the [place]  FDA and has been authorized for detection and/or diagnosis of SARS-CoV-2 by FDA under an Emergency Use Authorization (EUA). This EUA will remain in effect (meaning this test can be used) for the duration of the COVID-19 declaration under Section 564(b)(1) of the Act, 21 U.S.C. section 360bbb-3(b)(1), unless the authorization is terminated or revoked.  Performed at [facility], [address], [place], [place] [address]      Labs: Basic Metabolic Panel: Recent Labs  Lab 04/30/24 1616 05/01/24 0513  NA 143 141  K 4.0 3.7  CL 114* 112*  CO2 20* 20*  GLUCOSE 105* 95  BUN 34* 37*  CREATININE 3.55* 3.60*  CALCIUM  8.9 8.4*   Liver Function Tests: Recent Labs  Lab 04/30/24 1616  AST 16  ALT 13  ALKPHOS 50  BILITOT 0.7  PROT 6.4*  ALBUMIN  3.3*   No results for input(s): "LIPASE", "AMYLASE" in the last 168 hours. No results for input(s): "AMMONIA" in the last 168 hours. CBC: Recent Labs  Lab 04/30/24 1616 05/01/24 0513  WBC 6.0 6.3  HGB 9.3* 8.6*  HCT 29.8* 26.7*  MCV 103.8* 102.7*  PLT 137* 124*   Cardiac Enzymes: No results for input(s): "CKTOTAL", "CKMB", "CKMBINDEX", "TROPONINI" in the last 168  hours. BNP: BNP (last 3 results) Recent Labs    03/31/24 0403 04/15/24 1159 04/30/24 1620  BNP >4,500.0* 3,207.9* 3,584.8*    ProBNP (last 3 results) No results for input(s): "PROBNP" in the last 8760 hours.  CBG: Recent Labs  Lab 05/01/24 0920  GLUCAP 122*       Signed:  Donnetta Gains MD.  [facility] 05/01/2024, 1:43 PM

## 2024-05-01 NOTE — ED Notes (Signed)
 Declines neb at this time. No wheezing. Alert, NAD, calm, interactive, resps e/u, talking on phone, updated.

## 2024-05-10 ENCOUNTER — Encounter: Payer: Self-pay | Admitting: Physician Assistant

## 2024-05-10 ENCOUNTER — Ambulatory Visit: Payer: Medicare (Managed Care) | Attending: Physician Assistant | Admitting: Cardiology

## 2024-05-10 VITALS — BP 110/68 | HR 61 | Ht 68.5 in | Wt 138.0 lb

## 2024-05-10 DIAGNOSIS — I10 Essential (primary) hypertension: Secondary | ICD-10-CM | POA: Diagnosis not present

## 2024-05-10 DIAGNOSIS — Z8679 Personal history of other diseases of the circulatory system: Secondary | ICD-10-CM | POA: Diagnosis not present

## 2024-05-10 DIAGNOSIS — I340 Nonrheumatic mitral (valve) insufficiency: Secondary | ICD-10-CM

## 2024-05-10 DIAGNOSIS — N184 Chronic kidney disease, stage 4 (severe): Secondary | ICD-10-CM

## 2024-05-10 DIAGNOSIS — Z79899 Other long term (current) drug therapy: Secondary | ICD-10-CM

## 2024-05-10 MED ORDER — ASPIRIN 81 MG PO TBEC
81.0000 mg | DELAYED_RELEASE_TABLET | Freq: Every day | ORAL | 3 refills | Status: AC
Start: 2024-05-10 — End: ?

## 2024-05-10 NOTE — Progress Notes (Unsigned)
 Cardiology Office Note:  .   Date:  05/11/2024  ID:  Mary Stephens, DOB 29-Jan-1956, MRN [id number] PCP: Sarrah Cure, DO  [facility] Providers Cardiologist:  Eilleen Grates, MD {  History of Present Illness: .   Mary Stephens is a 68 y.o. 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  She has multiple hospital visits in the past 2 months.  April 2025 admitted for hypertensive emergency.  We saw her May 2025 for chest pain with negative troponins.  Previous history of dissection repair 2021, CT showing ascending repair with persistent flap in the distal arch that was not significantly changed.  Echocardiogram had shown moderate to severe MR, TEE ordered however case was aborted given inability to pass even a pediatric probe. She saw CT surgery at follow-up discuss options, overall any plan would probably result in her being on dialysis.  She requested second opinion at [facility]. Admitted again later May 2025 for HFpEF exacerbation, started on p.o. Lasix  40 mg.  Today patient presents for hospital follow-up, overall with stable CHF symptoms.  Not noting any significant shortness of breath, peripheral edema, orthopnea.  She has good urinary output on p.o. Lasix  40 mg daily.  We discussed her mitral regurgitation and other plans of being evaluated at [facility] which she has not been seen for yet.  She reports mild symptoms of shortness of breath however this has been somewhat chronic for her the past year.  Overall with functional symptoms without any significant decrease in exercise tolerance.  Otherwise today has no other complaints.  She is not taking multiple her medications listed on med list.  Although very unclear exactly what she is taking.  ROS: Denies: Chest pain, shortness of breath, orthopnea, peripheral  edema, palpitations, decreased exercise intolerance, fatigue, lightheadedness.   Studies Reviewed: .         Risk Assessment/Calculations:             Physical Exam:   VS:  BP 110/68   Pulse 61   Ht 5' 8.5" (1.74 m)   Wt 138 lb (62.6 kg)   SpO2 99%   BMI 20.68 kg/m    Wt Readings from Last 3 Encounters:  05/10/24 138 lb (62.6 kg)  04/20/24 138 lb 14.4 oz (63 kg)  04/15/24 132 lb 4.4 oz (60 kg)    GEN: Well nourished, well developed in no acute distress NECK: mild JVD; No carotid bruits CARDIAC: RRR, 3/6 murmur RESPIRATORY:  Clear to auscultation without rales, wheezing or rhonchi  ABDOMEN: Soft, non-tender, non-distended EXTREMITIES:  No edema; No deformity   ASSESSMENT AND PLAN: .    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  Overall symptoms seem to be decently compensated and functional.  Notes only mild shortness of breath but has had good control of her volume with current regiment of Lasix . Check BMP today.  Continue with Lasix  40 mg daily. We discussed how her underlying renal function and inability to tolerate TEE makes further evaluation prohibitive.  I think she is decently functional now that he can continue to monitor this closely and try to manage medically. Our biggest goal is to prevent further hospital admissions for HFpEF exacerbations.  Discussed ongoing management of CHF  including daily weights, compliance, informing office of any new weight gain or increased symptoms of shortness of breath.  Type a dissection repair 2021 She has persistent flap in the distal arch that has remained overall stable.  Evaluated by Dr. Deloise Ferries and she is at significant risk for any type of intervention and would likely result in dialysis.  She has asked for second opinion at [facility] in which this is still pending.  Anemia She has had  a precipitous decline in hemoglobin for the past month.  Notes hemorrhoids and bright red stool, no melena.  Hemoglobin today is 8.6.  We will check a CBC today, may be related to CKD.  CKD stage IV/V Renal disease status likely to worsen, adamantly has refused dialysis.  She reports being followed by nephrology, may be out of system I do not see any notes.  Hyperlipidemia I do not see recent LDL within the last 6 years.  Repeating lipid panel.  Hypertension Blood pressure today appears well-controlled 110/68.  She does have recent hospitalization in April 2025 for hypertensive emergency.  Not clear what her blood pressure really is at home.  Reports not taking her carvedilol  25 mg twice daily or her hydralazine  100 mg 3 times daily although we called her pharmacy company who states that they send their basket packaging or pill box thing. She will have a nurse visit 2 weeks from today, bring all medications and blood pressure log.  Type 2 diabetes A1c 8.6% May 2025, follow-up with PCP.  Dispo: 3 months, nurse visit 2 weeks for blood pressure.  Signed, Burnetta Cart, PA-C

## 2024-05-10 NOTE — Patient Instructions (Signed)
 Medication Instructions:  DECREASE ASPIRIN  81 MG DAILY *If you need a refill on your cardiac medications before your next appointment, please call your pharmacy*  Lab Work: CBC AND BMET TODAY  FASTING LIPID PANEL WITHIN 1-2 WEEKS If you have labs (blood work) drawn today and your tests are completely normal, you will receive your results only by: MyChart Message (if you have MyChart) OR A paper copy in the mail If you have any lab test that is abnormal or we need to change your treatment, we will call you to review the results.  Testing/Procedures: NO TESTING  Follow-Up: At [facility], you and your health needs are our priority.  As part of our continuing mission to provide you with exceptional heart care, our providers are all part of one team.  This team includes your primary Cardiologist (physician) and Advanced Practice Providers or APPs (Physician Assistants and Nurse Practitioners) who all work together to provide you with the care you need, when you need it.  Your next appointment:   FOLLOW UP AUGUST 13TH   Provider:   Gwenith Lente     We recommend signing up for the patient portal called "MyChart".  Sign up information is provided on this After Visit Summary.  MyChart is used to connect with patients for Virtual Visits (Telemedicine).  Patients are able to view lab/test results, encounter notes, upcoming appointments, etc.  Non-urgent messages can be sent to your provider as well.   To learn more about what you can do with MyChart, go to [email or web address].   Other Instructions Needs nurse only visit in 2 weeks  Check blood pressure twice a day and keep log. Bring log to next office visit. Also monitor your weight, May take extra tablet of lasix  if weight increase 3 lbs overnight or 5lbs in a week.

## 2024-05-11 LAB — BASIC METABOLIC PANEL WITH GFR
BUN/Creatinine Ratio: 12 (ref 12–28)
BUN: 48 mg/dL — ABNORMAL HIGH (ref 8–27)
CO2: 21 mmol/L (ref 20–29)
Calcium: 9.1 mg/dL (ref 8.7–10.3)
Chloride: 103 mmol/L (ref 96–106)
Creatinine, Ser: 3.85 mg/dL — ABNORMAL HIGH (ref 0.57–1.00)
Glucose: 99 mg/dL (ref 70–99)
Potassium: 4 mmol/L (ref 3.5–5.2)
Sodium: 143 mmol/L (ref 134–144)
eGFR: 12 mL/min/{1.73_m2} — ABNORMAL LOW (ref >59)

## 2024-05-11 LAB — LIPID PANEL
Chol/HDL Ratio: 2.5 ratio (ref 0.0–4.4)
Cholesterol, Total: 148 mg/dL (ref 100–199)
HDL: 59 mg/dL (ref >39)
LDL Chol Calc (NIH): 74 mg/dL (ref 0–99)
Triglycerides: 75 mg/dL (ref 0–149)
VLDL Cholesterol Cal: 15 mg/dL (ref 5–40)

## 2024-05-11 LAB — CBC
Hematocrit: 31.2 % — ABNORMAL LOW (ref 34.0–46.6)
Hemoglobin: 9.6 g/dL — ABNORMAL LOW (ref 11.1–15.9)
MCH: 32.3 pg (ref 26.6–33.0)
MCHC: 30.8 g/dL — ABNORMAL LOW (ref 31.5–35.7)
MCV: 105 fL — ABNORMAL HIGH (ref 79–97)
Platelets: 169 10*3/uL (ref 150–450)
RBC: 2.97 x10E6/uL — ABNORMAL LOW (ref 3.77–5.28)
RDW: 11.6 % — ABNORMAL LOW (ref 11.7–15.4)
WBC: 4.9 10*3/uL (ref 3.4–10.8)

## 2024-05-14 ENCOUNTER — Ambulatory Visit: Payer: Self-pay | Admitting: Physician Assistant

## 2024-05-24 ENCOUNTER — Ambulatory Visit: Payer: Medicare (Managed Care) | Attending: Cardiology

## 2024-05-24 VITALS — BP 120/75 | Wt 131.8 lb

## 2024-05-24 DIAGNOSIS — I10 Essential (primary) hypertension: Secondary | ICD-10-CM

## 2024-05-24 NOTE — Patient Instructions (Addendum)
 Medication Instructions:  Your physician recommends that you continue on your current medications as directed. Please refer to the Current Medication list given to you today.  PLEASE BRING ALL OF YOUR CURRENT MEDICATIONS TO YOUR NEXT OFFICE VISIT.    *If you need a refill on your cardiac medications before your next appointment, please call your pharmacy*   Lab Work: NONE    If you have labs (blood work) drawn today and your tests are completely normal, you will receive your results only by: MyChart Message (if you have MyChart) OR A paper copy in the mail If you have any lab test that is abnormal or we need to change your treatment, we will call you to review the results.   Testing/Procedures: NONE    Follow-Up: At [facility], you and your health needs are our priority.  As part of our continuing mission to provide you with exceptional heart care, we have created designated Provider Care Teams.  These Care Teams include your primary Cardiologist (physician) and Advanced Practice Providers (APPs -  Physician Assistants and Nurse Practitioners) who all work together to provide you with the care you need, when you need it.  We recommend signing up for the patient portal called MyChart.  Sign up information is provided on this After Visit Summary.  MyChart is used to connect with patients for Virtual Visits (Telemedicine).  Patients are able to view lab/test results, encounter notes, upcoming appointments, etc.  Non-urgent messages can be sent to your provider as well.   To learn more about what you can do with MyChart, go to [email or web address].    Your next appointment:   1 week(s)  The format for your next appointment:   In Person  Provider:   NURSE VISIT    Other Instructions  PLEASE CHECK YOUR BLOOD PRESSURE TWICE DAILY FOR THE NEXT 7 DAYS. WRITE THESE READINGS DOWN. BRING THIS LIST OF READINGS WITH YOU TO YOUR APPOINTMENT NEXT WEEK.

## 2024-05-24 NOTE — Progress Notes (Addendum)
 Patient ID: Mary Stephens, female   DOB: [birth year], 68 y.o.   MRN: [id number]    Nurse Visit   Date of Encounter: 05/24/2024 ID: Mary Stephens, DOB [birth year], MRN [id number]  PCP:  Sarrah Cure, DO   [facility] Providers Cardiologist:  Eilleen Grates, MD      Visit Details   VS:  BP 120/75 (BP Location: Left Arm, Patient Position: Sitting, Cuff Size: Normal)   Wt 131 lb 12.8 oz (59.8 kg)   BMI 19.75 kg/m  , BMI Body mass index is 19.75 kg/m.  Wt Readings from Last 3 Encounters:  05/24/24 131 lb 12.8 oz (59.8 kg)  05/10/24 138 lb (62.6 kg)  04/20/24 138 lb 14.4 oz (63 kg)     Reason for visit: BP FOLLOW UP Performed today: Vitals, Provider consulted: , and Education Changes (medications, testing, etc.) : NONE  Length of Visit: 10 minutes    Medications Adjustments/Labs and Tests Ordered: No orders of the defined types were placed in this encounter.  S - Situation: Patient here for follow-up on blood pressure. Today's BP is 120/75, weight 131.8 lbs. Patient did not bring current medication list to appointment.  B - Background: At last visit, patient was instructed to monitor blood pressure twice daily for 2 weeks and bring readings to this appointment. Patient was unable to complete this due to moving. Patient has been communicating with [facility] and [facility] regarding a referral for structural surgery. Patient states PCP was supposed to send referral but there have been difficulties and no contact has been made.  A - Assessment: Blood pressure today is within normal range. Patient is awaiting referral for surgery. Patient's compliance with BP monitoring was interrupted due to personal circumstances. Patient understands the need to monitor BP regularly and bring medication list.  R - Recommendation: Patient to be scheduled for follow-up in 1 week to recheck blood pressures. She should bring current medications and a log of twice daily BP readings. Referral to  [facility] to be prioritized as per patient's request.    Signed, Zeb Heys, RN  05/24/2024 4:55 PM

## 2024-05-25 ENCOUNTER — Emergency Department (HOSPITAL_COMMUNITY): Payer: Medicare (Managed Care)

## 2024-05-25 ENCOUNTER — Emergency Department (HOSPITAL_COMMUNITY)
Admission: EM | Admit: 2024-05-25 | Discharge: 2024-05-25 | Disposition: A | Discharge status: Home / Self Care | Payer: Medicare (Managed Care) | Attending: Emergency Medicine | Admitting: Emergency Medicine

## 2024-05-25 ENCOUNTER — Encounter (HOSPITAL_COMMUNITY): Payer: Self-pay | Admitting: *Deleted

## 2024-05-25 ENCOUNTER — Other Ambulatory Visit: Payer: Self-pay

## 2024-05-25 ENCOUNTER — Other Ambulatory Visit: Payer: Self-pay | Admitting: *Deleted

## 2024-05-25 DIAGNOSIS — Z8679 Personal history of other diseases of the circulatory system: Secondary | ICD-10-CM

## 2024-05-25 DIAGNOSIS — I340 Nonrheumatic mitral (valve) insufficiency: Secondary | ICD-10-CM

## 2024-05-25 DIAGNOSIS — K625 Hemorrhage of anus and rectum: Secondary | ICD-10-CM | POA: Diagnosis present

## 2024-05-25 DIAGNOSIS — Z7982 Long term (current) use of aspirin: Secondary | ICD-10-CM | POA: Diagnosis not present

## 2024-05-25 DIAGNOSIS — Z5329 Procedure and treatment not carried out because of patient's decision for other reasons: Secondary | ICD-10-CM | POA: Diagnosis not present

## 2024-05-25 DIAGNOSIS — K8020 Calculus of gallbladder without cholecystitis without obstruction: Secondary | ICD-10-CM | POA: Diagnosis not present

## 2024-05-25 LAB — CBC
HCT: 30.2 % — ABNORMAL LOW (ref 36.0–46.0)
Hemoglobin: 9.6 g/dL — ABNORMAL LOW (ref 12.0–15.0)
MCH: 32.1 pg (ref 26.0–34.0)
MCHC: 31.8 g/dL (ref 30.0–36.0)
MCV: 101 fL — ABNORMAL HIGH (ref 80.0–100.0)
Platelets: 179 10*3/uL (ref 150–400)
RBC: 2.99 MIL/uL — ABNORMAL LOW (ref 3.87–5.11)
RDW: 12.1 % (ref 11.5–15.5)
WBC: 4.2 10*3/uL (ref 4.0–10.5)
nRBC: 0 % (ref 0.0–0.2)

## 2024-05-25 LAB — URINALYSIS, ROUTINE W REFLEX MICROSCOPIC
Bilirubin Urine: NEGATIVE
Glucose, UA: NEGATIVE mg/dL
Hgb urine dipstick: NEGATIVE
Ketones, ur: NEGATIVE mg/dL
Nitrite: NEGATIVE
Protein, ur: 30 mg/dL — AB
Specific Gravity, Urine: 1.01 (ref 1.005–1.030)
pH: 5 (ref 5.0–8.0)

## 2024-05-25 LAB — COMPREHENSIVE METABOLIC PANEL WITH GFR
ALT: 10 U/L (ref 0–44)
AST: 14 U/L — ABNORMAL LOW (ref 15–41)
Albumin: 3.8 g/dL (ref 3.5–5.0)
Alkaline Phosphatase: 57 U/L (ref 38–126)
Anion gap: 11 (ref 5–15)
BUN: 58 mg/dL — ABNORMAL HIGH (ref 8–23)
CO2: 23 mmol/L (ref 22–32)
Calcium: 8.8 mg/dL — ABNORMAL LOW (ref 8.9–10.3)
Chloride: 109 mmol/L (ref 98–111)
Creatinine, Ser: 4.29 mg/dL — ABNORMAL HIGH (ref 0.44–1.00)
GFR, Estimated: 11 mL/min — ABNORMAL LOW (ref >60)
Glucose, Bld: 84 mg/dL (ref 70–99)
Potassium: 3.3 mmol/L — ABNORMAL LOW (ref 3.5–5.1)
Sodium: 143 mmol/L (ref 135–145)
Total Bilirubin: 0.4 mg/dL (ref 0.0–1.2)
Total Protein: 6.8 g/dL (ref 6.5–8.1)

## 2024-05-25 LAB — SAMPLE TO BLOOD BANK

## 2024-05-25 LAB — LIPASE, BLOOD: Lipase: 34 U/L (ref 11–51)

## 2024-05-25 NOTE — ED Triage Notes (Signed)
 The pt has had rectal bleeding since this am  bright red  none since then    she has had hemorrhoids also  she has an appointment  at [facility] for her heart thursday

## 2024-05-25 NOTE — ED Triage Notes (Signed)
Sign pad not working

## 2024-05-25 NOTE — Addendum Note (Signed)
 Addended by: Zeb Heys on: 05/25/2024 03:27 PM   Modules accepted: Orders

## 2024-05-25 NOTE — ED Notes (Addendum)
 Pt seen leaving ED

## 2024-05-25 NOTE — Discharge Instructions (Addendum)
 I would recommend that you schedule a follow-up appointment with a gastroenterologist for possible colonoscopy.  You can use our referred GI group, or reach out to your primary care clinic.  I also recommend scheduling a follow-up appointment with your primary care clinic this week to recheck your symptoms and possibly recheck your blood work as well.

## 2024-05-25 NOTE — ED Provider Triage Note (Signed)
 Emergency Medicine Provider Triage Evaluation Note  Mary Stephens , a 68 y.o. female  was evaluated in triage.  Pt complains of rectal bleeding.  Patient reports this morning she felt the urge to go to the bathroom.  When she had a bowel movements there was bright red blood in the toilet bowl and mixed with the stool.  Denies any abdominal pain but states she just feels some nausea.  Has a history of hemorrhoids but states that this is more blood than usually occurs with her hemorrhoids.  Review of Systems  Positive: Bloody stools Negative: Abdominal pain, vomiting, fevers  Physical Exam  BP 123/78 (BP Location: Left Arm)   Pulse 70   Temp 98.4 F (36.9 C)   Ht 5' 8 (1.727 m)   Wt 59.8 kg   SpO2 98%   BMI 20.05 kg/m  Gen:   Awake, no distress   Resp:  Normal effort  MSK:   Moves extremities without difficulty  Other:    Medical Decision Making  Medically screening exam initiated at 6:10 PM.  Appropriate orders placed.  GHADEER KASTELIC was informed that the remainder of the evaluation will be completed by another provider, this initial triage assessment does not replace that evaluation, and the importance of remaining in the ED until their evaluation is complete.   Mary Dusky, PA-C 05/25/24 [phone]

## 2024-05-25 NOTE — ED Provider Notes (Signed)
 [place] EMERGENC[facility] DEPARTMENT AT [facility] Provider Note   CSN: [id number] Arrival date & time: 05/25/24  1606     Patient presents with: Rectal Bleeding   Mary Stephens is a 68 y.o. female presented to ED with concern for bright red blood in her stool.  She has a history of hemorrhoids.  She reports she had 2 bloody bowel movements last night and this morning.  She denies any vomiting.  She has been eating normally.  Denies constipation.  She is not on anticoagulation.   HPI     Prior to Admission medications   Medication Sig Start Date End Date Taking? Authorizing Provider  acetaminophen  (TYLENOL ) 500 MG tablet Take 500-1,000 mg by mouth every 8 (eight) hours as needed (for pain).    [provider]  amLODipine  (NORVASC ) 10 MG tablet Take 1 tablet (10 mg total) by mouth daily. 04/03/24   Krishnan, Gokul, MD  aspirin  EC 81 MG tablet Take 1 tablet (81 mg total) by mouth daily. Swallow whole. 05/10/24   Meng, Hao, PA  atorvastatin  (LIPITOR) 40 MG tablet Take 40 mg by mouth at bedtime.    [provider]  furosemide  (LASIX ) 40 MG tablet Take 1 tablet (40 mg total) by mouth daily. 05/02/24 06/01/24  Etter Hermann., MD  glipiZIDE  (GLUCOTROL  XL) 5 MG 24 hr tablet Take 1 tablet (5 mg total) by mouth daily with breakfast. Patient not taking: Reported on 06/20/2020 01/27/18 06/20/20  Fleming, Zelda W, NP    Allergies: Patient has no known allergies.    Review of Systems  Updated Vital Signs BP (!) 141/85 (BP Location: Left Arm)   Pulse 65   Temp 97.6 F (36.4 C)   Resp 20   Ht 5' 8 (1.727 m)   Wt 59.8 kg   SpO2 100%   BMI 20.05 kg/m   Physical Exam Constitutional:      General: She is not in acute distress. HENT:     Head: Normocephalic and atraumatic.   Eyes:     Conjunctiva/sclera: Conjunctivae normal.     Pupils: Pupils are equal, round, and reactive to light.    Cardiovascular:     Rate and Rhythm: Normal rate and regular rhythm.   Pulmonary:     Effort: Pulmonary effort is normal. No respiratory distress.  Abdominal:     General: There is no distension.     Tenderness: There is no abdominal tenderness.  Genitourinary:    Comments: No acute rectal hemorrhage, no bleeding hemorrhoid  Skin:    General: Skin is warm and dry.   Neurological:     General: No focal deficit present.     Mental Status: She is alert. Mental status is at baseline.   Psychiatric:        Mood and Affect: Mood normal.        Behavior: Behavior normal.     (all labs ordered are listed, but only abnormal results are displayed) Labs Reviewed  COMPREHENSIVE METABOLIC PANEL WITH GFR - Abnormal; Notable for the following components:      Result Value   Potassium 3.3 (*)    BUN 58 (*)    Creatinine, Ser 4.29 (*)    Calcium  8.8 (*)    AST 14 (*)    GFR, Estimated 11 (*)    All other components within normal limits  CBC - Abnormal; Notable for the following components:   RBC 2.99 (*)    Hemoglobin 9.6 (*)  HCT 30.2 (*)    MCV 101.0 (*)    All other components within normal limits  URINALYSIS, ROUTINE W REFLEX MICROSCOPIC - Abnormal; Notable for the following components:   APPearance CLOUDY (*)    Protein, ur 30 (*)    Leukocytes,Ua TRACE (*)    Bacteria, UA RARE (*)    All other components within normal limits  LIPASE, BLOOD  POC OCCULT BLOOD, ED  SAMPLE TO BLOOD BANK    EKG: None  Radiology: CT ABDOMEN PELVIS WO CONTRAST Result Date: 05/25/2024 CLINICAL DATA:  Rectal bleeding EXAM: CT ABDOMEN AND PELVIS WITHOUT CONTRAST TECHNIQUE: Multidetector CT imaging of the abdomen and pelvis was performed following the standard protocol without IV contrast. RADIATION DOSE REDUCTION: This exam was performed according to the departmental dose-optimization program which includes automated exposure control, adjustment of the mA and/or kV according to patient size and/or use of iterative reconstruction technique. COMPARISON:  None  Available. FINDINGS: Lower chest: No acute abnormality. Hepatobiliary: Small gallstones are noted. Liver is within normal limits. Pancreas: Unremarkable. No pancreatic ductal dilatation or surrounding inflammatory changes. Spleen: Normal in size without focal abnormality. 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 Left kidney shows atrophy with multiple hyperdense and hypodense cysts. Right kidney shows similar findings although no atrophy is seen. No obstructive changes are noted. The bladder is partially distended. Stomach/Bowel: No obstructive or inflammatory changes of the colon are seen. The appendix is within normal limits. Scattered fecal material is noted throughout the colon. Small bowel and stomach are within normal limits. Vascular/Lymphatic: Prominent proximal abdominal aorta is noted related to prior dissection and tortuosity. The dissection is not well appreciated due to lack of IV contrast although some intimal calcification is noted within the mid aorta stable from the prior exam. No distal dilatation of the aorta is seen. Iliac calcifications are noted. No adenopathy is seen. Reproductive: Status post hysterectomy. No adnexal masses. Other: No abdominal wall hernia or abnormality. No abdominopelvic ascites. Musculoskeletal: No acute or significant osseous findings. IMPRESSION: Cholelithiasis without complicating factors. No abnormality is identified to correspond with the given clinical history of rectal bleeding. Hyperdense and hypodense cystic lesions are identified within both kidneys. These changes are stable from prior ultrasound from 04/01/2024. 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 No acute abnormality is noted. Electronically Signed   By: Violeta Grey M.D.   On: 05/25/2024 19:17     Procedures   Medications Ordered in the ED - No data to display                                  Medical Decision  Making Amount and/or Complexity of Data Reviewed Labs: ordered.   Well-appearing patient here with suspected lower GI bleed.  This may be diverticulosis versus hemorrhoidal bleed.  She is well-appearing on exam.  I personally reviewed interpret the patient's labs and imaging.  There were no emergent findings on her imaging.  We discussed incidental gallstones and other incidental findings.  Her hemoglobin has been stable over the past 2 weeks with no acute drop.  I think she is stable for outpatient follow-up.  I will give her GI follow-up as she reports she has not had a colonoscopy in the past.  Close return precautions were discussed.  She verbalized understanding     Final diagnoses:  Rectal bleeding  Calculus of gallbladder without cholecystitis without obstruction  ED Discharge Orders     None          Addisson Frate, Janalyn Me, MD 05/25/24 [phone]

## 2024-05-31 ENCOUNTER — Ambulatory Visit: Payer: Medicare (Managed Care) | Attending: Cardiovascular Disease

## 2024-05-31 VITALS — BP 132/80 | HR 68 | Ht 68.0 in | Wt 135.0 lb

## 2024-05-31 DIAGNOSIS — Z79899 Other long term (current) drug therapy: Secondary | ICD-10-CM

## 2024-05-31 DIAGNOSIS — I10 Essential (primary) hypertension: Secondary | ICD-10-CM

## 2024-05-31 NOTE — Patient Instructions (Addendum)
 Medication Instructions:  Your physician recommends that you continue on your current medications as directed. Please refer to the Current Medication list given to you today.  *If you need a refill on your cardiac medications before your next appointment, please call your pharmacy*  Lab Work: If you have labs (blood work) drawn today and your tests are completely normal, you will receive your results only by: MyChart Message (if you have MyChart) OR A paper copy in the mail If you have any lab test that is abnormal or we need to change your treatment, we will call you to review the results.  Testing/Procedures: None ordered today.  Follow-Up: At [facility], you and your health needs are our priority.  As part of our continuing mission to provide you with exceptional heart care, our providers are all part of one team.  This team includes your primary Cardiologist (physician) and Advanced Practice Providers or APPs (Physician Assistants and Nurse Practitioners) who all work together to provide you with the care you need, when you need it.  Your next appointment:   Keep follow-up in August  Provider:   Marlana Silvan NP  We recommend signing up for the patient portal called MyChart.  Sign up information is provided on this After Visit Summary.  MyChart is used to connect with patients for Virtual Visits (Telemedicine).  Patients are able to view lab/test results, encounter notes, upcoming appointments, etc.  Non-urgent messages can be sent to your provider as well.   To learn more about what you can do with MyChart, go to [email or web address].   Other Instructions  Please keep a log of your daily blood pressures and weight.

## 2024-05-31 NOTE — Progress Notes (Deleted)
   Nurse Visit   Date of Encounter: 05/31/2024 ID: Mary Stephens, DOB [birth year], MRN [id number]  PCP:  Sarrah Cure, DO   [facility] Providers Cardiologist:  Eilleen Grates, MD { Click to update primary MD,subspecialty MD or APP then REFRESH:1}     Visit Details   VS:  There were no vitals taken for this visit. , BMI There is no height or weight on file to calculate BMI.  Wt Readings from Last 3 Encounters:  05/25/24 131 lb 13.4 oz (59.8 kg)  05/24/24 131 lb 12.8 oz (59.8 kg)  05/10/24 138 lb (62.6 kg)     Reason for visit: BP check Performed today: Vitals, Provider consulted:DOD, Dr. Renna Cary, and Education Patient was suppose to keep log of BP, but has not been doing this. Encouraged patient to continue to take her BP medications. Updated patient's medications to what she had been taking. Changes (medications, testing, etc.) : No changes Length of Visit: 30 minutes    Medications Adjustments/Labs and Tests Ordered: No orders of the defined types were placed in this encounter.  No orders of the defined types were placed in this encounter.    Signed, Antonetta Kitchen, RN  05/31/2024 3:41 PM

## 2024-05-31 NOTE — Progress Notes (Signed)
   Nurse Visit   Date of Encounter: 05/31/2024 ID: Mary Stephens, DOB 05/05/1956, MRN [id number]  PCP:  Sarrah Cure, DO   [facility] Providers Cardiologist:  Eilleen Grates, MD      Visit Details   VS:  BP 132/80   Pulse 68   Ht 5' 8 (1.727 m)   Wt 135 lb (61.2 kg)   SpO2 99%   BMI 20.53 kg/m  , BMI Body mass index is 20.53 kg/m.  Wt Readings from Last 3 Encounters:  05/31/24 135 lb (61.2 kg)  05/25/24 131 lb 13.4 oz (59.8 kg)  05/24/24 131 lb 12.8 oz (59.8 kg)      Reason for visit: BP check Performed today: Vitals, Provider consulted:DOD, Dr. Renna Cary, and Education Patient was suppose to keep log of BP, but has not been doing this. Encouraged patient to continue to take her BP medications. Updated patient's medications to what she had been taking. Changes (medications, testing, etc.) : No changes Length of Visit: 30 minutes    Medications Adjustments/Labs and Tests Ordered: No orders of the defined types were placed in this encounter.  No orders of the defined types were placed in this encounter.    Signed, Antonetta Kitchen, RN  05/31/2024 5:11 PM

## 2024-07-24 NOTE — Progress Notes (Deleted)
 Cardiology Office Note:  .   Date:  07/24/2024  ID:  Mary Stephens, DOB March 12, 1956, MRN [id number] PCP: Cloria Annabella CROME, DO  [facility] Providers Cardiologist:  Lynwood Schilling, MD {  History of Present Illness: .   Mary Stephens is a 68 y.o. 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  Echocardiogram demonstrated moderate to severe MR, TEE aborted given inability to pass with pediatric probe.  She saw CT surgery who felt she was not a surgical candidate and would likely result in her being on dialysis which she was adamantly against. Had another admission 04/2024 for HFpEF exacerbation.  Type a dissection repair 2021 Has persistent 5 in the distal arch that has remained stable.    Social history      Patient with history of severe MR not felt to be a surgical candidate by CT surgery here at [facility].  I saw her for the first time 04/2024 and at that time had been reporting compensated disease and overall very functional.  She had requested second opinion and then was eventually seen at [facility] 05/2024 and at the time had noted progressive symptoms with significant shortness of breath, DOE and decreased energy levels.  They had tentative plans for mitral clip.  They deferred to CT surgery about whether she would be a viable surgical candidate but previously not felt to be.  She still has not seen CT surgery and since previous admission.  Patient recently seen 05/2024 in the emergency room with no admission for rectal bleeding felt related to either diverticulosis/hemorrhoids.  Was told to follow-up with GI given stable hemoglobins.  Moderate to severe MR Moderate AR Noted on echocardiogram 04/2024 with unclear etiology.  Dr. Shyrl from CT surgery felt she was not a great surgical  candidate in intervention likely would result in dialysis which she has been adamantly against.  She saw [facility] who has tentative plans for a mitral clip.  Type a dissection repair 2021 She has persistent flap in the distal arch status remains stable.  Again Dr. Shyrl feels she is at significant risk for any type of intervention.  Anemia  CKD Adamantly against dialysis.  Supposed to be followed by nephrology however I do not see any recent notes and she was encouraged last office visit to follow-up.  Hyperlipidemia  Hypertension It is unclear what she is taking.  She has had a couple nurse visits where we requested blood pressure logs and a list of her medications in which she has not yet provided.  Type 2 diabetes A1c 8.6% May 2025.    ROS: Denies: Chest pain, shortness of breath, orthopnea, peripheral edema, palpitations, decreased exercise intolerance, fatigue, lightheadedness.   Studies Reviewed: .         Risk Assessment/Calculations:   {Does this patient have ATRIAL FIBRILLATION?:[id number]} No BP recorded.  {Refresh Note OR Click here to enter BP  :1}***       Physical Exam:   VS:  There were no vitals taken for this visit.   Wt Readings from Last 3 Encounters:  05/31/24 135 lb (61.2 kg)  05/25/24 131 lb 13.4 oz (59.8 kg)  05/24/24 131 lb 12.8 oz (59.8 kg)    GEN: Well nourished, well developed in no acute distress NECK: No JVD; No carotid bruits CARDIAC: ***RRR, no  murmurs, rubs, gallops RESPIRATORY:  Clear to auscultation without rales, wheezing or rhonchi  ABDOMEN: Soft, non-tender, non-distended EXTREMITIES:  No edema; No deformity   ASSESSMENT AND PLAN: .         {Are you ordering a CV Procedure (e.g. stress test, cath, DCCV, TEE, etc)?   Press F2        :[id number]}  Dispo: ***  Signed, Thom LITTIE Sluder, PA-C

## 2024-07-25 ENCOUNTER — Ambulatory Visit: Payer: Medicare (Managed Care) | Attending: Nurse Practitioner | Admitting: Nurse Practitioner

## 2024-07-25 DIAGNOSIS — Z8679 Personal history of other diseases of the circulatory system: Secondary | ICD-10-CM

## 2024-07-25 DIAGNOSIS — N184 Chronic kidney disease, stage 4 (severe): Secondary | ICD-10-CM

## 2024-07-25 DIAGNOSIS — I340 Nonrheumatic mitral (valve) insufficiency: Secondary | ICD-10-CM

## 2024-07-25 DIAGNOSIS — I10 Essential (primary) hypertension: Secondary | ICD-10-CM

## 2024-08-24 ENCOUNTER — Other Ambulatory Visit: Payer: Self-pay | Admitting: Nephrology

## 2024-08-24 DIAGNOSIS — I38 Endocarditis, valve unspecified: Secondary | ICD-10-CM

## 2024-09-19 ENCOUNTER — Other Ambulatory Visit: Payer: Self-pay | Admitting: Nurse Practitioner

## 2024-09-19 DIAGNOSIS — Z1231 Encounter for screening mammogram for malignant neoplasm of breast: Secondary | ICD-10-CM

## 2024-10-22 DIAGNOSIS — K649 Unspecified hemorrhoids: Secondary | ICD-10-CM | POA: Insufficient documentation

## 2024-11-20 ENCOUNTER — Encounter (HOSPITAL_COMMUNITY): Payer: Self-pay

## 2024-11-20 ENCOUNTER — Other Ambulatory Visit: Payer: Self-pay

## 2024-11-20 ENCOUNTER — Ambulatory Visit: Payer: Medicare (Managed Care)

## 2024-11-20 ENCOUNTER — Inpatient Hospital Stay (HOSPITAL_COMMUNITY)
Admission: EM | Admit: 2024-11-20 | Discharge: 2024-12-01 | DRG: 375 | Disposition: A | Discharge status: Home / Self Care | Payer: Medicare (Managed Care) | Source: Ambulatory Visit | Attending: Internal Medicine | Admitting: Internal Medicine

## 2024-11-20 DIAGNOSIS — I3139 Other pericardial effusion (noninflammatory): Secondary | ICD-10-CM | POA: Diagnosis present

## 2024-11-20 DIAGNOSIS — F17210 Nicotine dependence, cigarettes, uncomplicated: Secondary | ICD-10-CM | POA: Diagnosis present

## 2024-11-20 DIAGNOSIS — K649 Unspecified hemorrhoids: Secondary | ICD-10-CM | POA: Diagnosis present

## 2024-11-20 DIAGNOSIS — D500 Iron deficiency anemia secondary to blood loss (chronic): Secondary | ICD-10-CM | POA: Diagnosis not present

## 2024-11-20 DIAGNOSIS — I130 Hypertensive heart and chronic kidney disease with heart failure and stage 1 through stage 4 chronic kidney disease, or unspecified chronic kidney disease: Secondary | ICD-10-CM | POA: Diagnosis present

## 2024-11-20 DIAGNOSIS — I10 Essential (primary) hypertension: Secondary | ICD-10-CM | POA: Diagnosis not present

## 2024-11-20 DIAGNOSIS — D62 Acute posthemorrhagic anemia: Secondary | ICD-10-CM | POA: Diagnosis present

## 2024-11-20 DIAGNOSIS — D649 Anemia, unspecified: Secondary | ICD-10-CM

## 2024-11-20 DIAGNOSIS — Z833 Family history of diabetes mellitus: Secondary | ICD-10-CM

## 2024-11-20 DIAGNOSIS — C20 Malignant neoplasm of rectum: Secondary | ICD-10-CM | POA: Diagnosis present

## 2024-11-20 DIAGNOSIS — Z8679 Personal history of other diseases of the circulatory system: Secondary | ICD-10-CM | POA: Diagnosis not present

## 2024-11-20 DIAGNOSIS — K2100 Gastro-esophageal reflux disease with esophagitis, without bleeding: Secondary | ICD-10-CM | POA: Diagnosis present

## 2024-11-20 DIAGNOSIS — F418 Other specified anxiety disorders: Secondary | ICD-10-CM | POA: Diagnosis not present

## 2024-11-20 DIAGNOSIS — Z7982 Long term (current) use of aspirin: Secondary | ICD-10-CM

## 2024-11-20 DIAGNOSIS — Z85048 Personal history of other malignant neoplasm of rectum, rectosigmoid junction, and anus: Secondary | ICD-10-CM

## 2024-11-20 DIAGNOSIS — D631 Anemia in chronic kidney disease: Secondary | ICD-10-CM | POA: Diagnosis present

## 2024-11-20 DIAGNOSIS — E119 Type 2 diabetes mellitus without complications: Secondary | ICD-10-CM

## 2024-11-20 DIAGNOSIS — I5032 Chronic diastolic (congestive) heart failure: Secondary | ICD-10-CM | POA: Diagnosis present

## 2024-11-20 DIAGNOSIS — K8020 Calculus of gallbladder without cholecystitis without obstruction: Secondary | ICD-10-CM | POA: Diagnosis present

## 2024-11-20 DIAGNOSIS — E785 Hyperlipidemia, unspecified: Secondary | ICD-10-CM | POA: Diagnosis present

## 2024-11-20 DIAGNOSIS — Z90711 Acquired absence of uterus with remaining cervical stump: Secondary | ICD-10-CM | POA: Diagnosis not present

## 2024-11-20 DIAGNOSIS — E1122 Type 2 diabetes mellitus with diabetic chronic kidney disease: Secondary | ICD-10-CM | POA: Diagnosis present

## 2024-11-20 DIAGNOSIS — Z7984 Long term (current) use of oral hypoglycemic drugs: Secondary | ICD-10-CM | POA: Diagnosis not present

## 2024-11-20 DIAGNOSIS — K922 Gastrointestinal hemorrhage, unspecified: Secondary | ICD-10-CM | POA: Diagnosis present

## 2024-11-20 DIAGNOSIS — Z860100 Personal history of colon polyps, unspecified: Secondary | ICD-10-CM | POA: Diagnosis not present

## 2024-11-20 DIAGNOSIS — K6289 Other specified diseases of anus and rectum: Secondary | ICD-10-CM | POA: Diagnosis not present

## 2024-11-20 DIAGNOSIS — N179 Acute kidney failure, unspecified: Secondary | ICD-10-CM | POA: Diagnosis present

## 2024-11-20 DIAGNOSIS — K625 Hemorrhage of anus and rectum: Principal | ICD-10-CM | POA: Diagnosis present

## 2024-11-20 DIAGNOSIS — E1142 Type 2 diabetes mellitus with diabetic polyneuropathy: Secondary | ICD-10-CM | POA: Diagnosis not present

## 2024-11-20 DIAGNOSIS — Z604 Social exclusion and rejection: Secondary | ICD-10-CM | POA: Diagnosis present

## 2024-11-20 DIAGNOSIS — B182 Chronic viral hepatitis C: Secondary | ICD-10-CM | POA: Diagnosis present

## 2024-11-20 DIAGNOSIS — H9202 Otalgia, left ear: Secondary | ICD-10-CM | POA: Diagnosis present

## 2024-11-20 DIAGNOSIS — R970 Elevated carcinoembryonic antigen [CEA]: Secondary | ICD-10-CM | POA: Diagnosis present

## 2024-11-20 DIAGNOSIS — K2980 Duodenitis without bleeding: Secondary | ICD-10-CM | POA: Diagnosis present

## 2024-11-20 DIAGNOSIS — Z952 Presence of prosthetic heart valve: Secondary | ICD-10-CM | POA: Diagnosis not present

## 2024-11-20 DIAGNOSIS — K219 Gastro-esophageal reflux disease without esophagitis: Secondary | ICD-10-CM | POA: Diagnosis not present

## 2024-11-20 DIAGNOSIS — Z8619 Personal history of other infectious and parasitic diseases: Secondary | ICD-10-CM | POA: Diagnosis not present

## 2024-11-20 DIAGNOSIS — D490 Neoplasm of unspecified behavior of digestive system: Secondary | ICD-10-CM | POA: Diagnosis not present

## 2024-11-20 DIAGNOSIS — I700 Atherosclerosis of aorta: Secondary | ICD-10-CM | POA: Diagnosis present

## 2024-11-20 DIAGNOSIS — K2950 Unspecified chronic gastritis without bleeding: Secondary | ICD-10-CM | POA: Diagnosis present

## 2024-11-20 DIAGNOSIS — N184 Chronic kidney disease, stage 4 (severe): Secondary | ICD-10-CM | POA: Diagnosis present

## 2024-11-20 DIAGNOSIS — D696 Thrombocytopenia, unspecified: Secondary | ICD-10-CM | POA: Diagnosis present

## 2024-11-20 DIAGNOSIS — C218 Malignant neoplasm of overlapping sites of rectum, anus and anal canal: Secondary | ICD-10-CM | POA: Diagnosis not present

## 2024-11-20 DIAGNOSIS — Z79899 Other long term (current) drug therapy: Secondary | ICD-10-CM

## 2024-11-20 DIAGNOSIS — D539 Nutritional anemia, unspecified: Secondary | ICD-10-CM | POA: Diagnosis present

## 2024-11-20 LAB — COMPREHENSIVE METABOLIC PANEL WITH GFR
ALT: 7 U/L (ref 0–44)
AST: 15 U/L (ref 15–41)
Albumin: 3.3 g/dL — ABNORMAL LOW (ref 3.5–5.0)
Alkaline Phosphatase: 61 U/L (ref 38–126)
Anion gap: 8 (ref 5–15)
BUN: 36 mg/dL — ABNORMAL HIGH (ref 8–23)
CO2: 21 mmol/L — ABNORMAL LOW (ref 22–32)
Calcium: 9 mg/dL (ref 8.9–10.3)
Chloride: 113 mmol/L — ABNORMAL HIGH (ref 98–111)
Creatinine, Ser: 4.44 mg/dL — ABNORMAL HIGH (ref 0.44–1.00)
GFR, Estimated: 10 mL/min — ABNORMAL LOW (ref >60)
Glucose, Bld: 107 mg/dL — ABNORMAL HIGH (ref 70–99)
Potassium: 3.9 mmol/L (ref 3.5–5.1)
Sodium: 142 mmol/L (ref 135–145)
Total Bilirubin: 0.7 mg/dL (ref 0.0–1.2)
Total Protein: 5.9 g/dL — ABNORMAL LOW (ref 6.5–8.1)

## 2024-11-20 LAB — CBC
HCT: 21.2 % — ABNORMAL LOW (ref 36.0–46.0)
Hemoglobin: 6.5 g/dL — CL (ref 12.0–15.0)
MCH: 31.3 pg (ref 26.0–34.0)
MCHC: 30.7 g/dL (ref 30.0–36.0)
MCV: 101.9 fL — ABNORMAL HIGH (ref 80.0–100.0)
Platelets: 157 K/uL (ref 150–400)
RBC: 2.08 MIL/uL — ABNORMAL LOW (ref 3.87–5.11)
RDW: 15.8 % — ABNORMAL HIGH (ref 11.5–15.5)
WBC: 4.8 K/uL (ref 4.0–10.5)
nRBC: 0 % (ref 0.0–0.2)

## 2024-11-20 LAB — POC OCCULT BLOOD, ED: Fecal Occult Bld: POSITIVE — AB

## 2024-11-20 LAB — PREPARE RBC (CROSSMATCH)

## 2024-11-20 MED ORDER — ACETAMINOPHEN 650 MG RE SUPP: 650.0000 mg | Freq: Four times a day (QID) | RECTAL | Status: DC | PRN

## 2024-11-20 MED ORDER — LORAZEPAM 1 MG PO TABS
0.5000 mg | ORAL_TABLET | ORAL | Status: DC | PRN
  Filled 2024-11-20: qty 1

## 2024-11-20 MED ORDER — SODIUM CHLORIDE 0.9 % IV SOLN: INTRAVENOUS | Status: AC

## 2024-11-20 MED ORDER — SODIUM CHLORIDE 0.9% IV SOLUTION: Freq: Once | INTRAVENOUS | Status: AC

## 2024-11-20 MED ORDER — ACETAMINOPHEN 325 MG PO TABS
650.0000 mg | ORAL_TABLET | Freq: Four times a day (QID) | ORAL | Status: DC | PRN
  Administered 2024-11-23 – 2024-11-29 (×5): 650 mg via ORAL
  Filled 2024-11-20 (×5): qty 2

## 2024-11-20 MED ORDER — POLYETHYLENE GLYCOL 3350 17 G PO PACK
17.0000 g | PACK | Freq: Every day | ORAL | Status: DC | PRN
  Filled 2024-11-20: qty 1

## 2024-11-20 MED ORDER — SODIUM CHLORIDE 0.9% FLUSH
3.0000 mL | Freq: Two times a day (BID) | INTRAVENOUS | Status: DC
  Administered 2024-11-20 – 2024-12-01 (×15): 3 mL via INTRAVENOUS

## 2024-11-20 NOTE — ED Triage Notes (Signed)
 PT BIB [facility] from a doctors office for GI bleed starting last night. Bright red blood x 3 since last night.  Pt is not on a blood thinner. A&O x 4. No dizziness or SOB at this time.   114/80 HR 70 RR 16 100%, CBG 131

## 2024-11-20 NOTE — ED Provider Notes (Signed)
 [place] EMERGENCY DEPARTMENT AT [facility] Provider Note   CSN: [id number] Arrival date & time: 11/20/24  1337     Patient presents with: No chief complaint on file.   Mary Stephens is a 68 y.o. female.  {Add pertinent medical, surgical, social history, OB history to HPI:3556} 68 year old female with a past medical history of hypertension, diabetes, colon polyps presents to the ED via EMS with a chief complaint of rectal bleeding.  Patient reports her symptoms began last night, when suddenly she was gushing blood out , reports this feels like a faucet has been open and all the bleeding pours out .  She reports being evaluated at her PCPs office today, sent to the emergency department for emergent evaluation.  She reports feeling lightheaded when these episodes occur.  She does have a prior history of blood transfusion.  She reports the bleeding is spontaneous, does not occurred if she is having a bowel movement.  She denies any syncope, no shortness of breath, no chest pain.  The history is provided by the patient.       Prior to Admission medications   Medication Sig Start Date End Date Taking? Authorizing Provider  acetaminophen  (TYLENOL ) 500 MG tablet Take 500-1,000 mg by mouth every 8 (eight) hours as needed (for pain).    [provider]  amLODipine  (NORVASC ) 10 MG tablet Take 1 tablet (10 mg total) by mouth daily. 04/03/24   Verdene Purchase, MD  aspirin  EC 81 MG tablet Take 1 tablet (81 mg total) by mouth daily. Swallow whole. 05/10/24   Meng, Hao, PA  atorvastatin  (LIPITOR) 40 MG tablet Take 40 mg by mouth at bedtime.    [provider]  furosemide  (LASIX ) 40 MG tablet Take 1 tablet (40 mg total) by mouth daily. 05/02/24 06/01/24  Perri DELENA Meliton Mickey., MD  gabapentin  (NEURONTIN ) 300 MG capsule Take 300 mg by mouth daily.    [provider]  hydrALAZINE  (APRESOLINE ) 100 MG tablet Take 100 mg by mouth 3 (three) times daily.    [provider]  metoprolol  tartrate (LOPRESSOR ) 50 MG tablet Take 50 mg by mouth 2 (two) times daily.    [provider]  glipiZIDE  (GLUCOTROL  XL) 5 MG 24 hr tablet Take 1 tablet (5 mg total) by mouth daily with breakfast. Patient not taking: Reported on 06/20/2020 01/27/18 06/20/20  Fleming, Zelda W, NP    Allergies: Patient has no known allergies.    Review of Systems  Constitutional:  Negative for fever.  Respiratory:  Negative for shortness of breath.   Cardiovascular:  Negative for chest pain.  Gastrointestinal:  Positive for abdominal pain, blood in stool and rectal pain.  Genitourinary:  Negative for flank pain.  Musculoskeletal:  Negative for back pain.  All other systems reviewed and are negative.   Updated Vital Signs BP 114/75 (BP Location: Left Arm)   Pulse 67   Temp 97.7 F (36.5 C) (Oral)   Resp 17   SpO2 100%   Physical Exam Vitals and nursing note reviewed. Exam conducted with a chaperone present.  Constitutional:      General: She is not in acute distress.    Appearance: She is well-developed.  HENT:     Head: Normocephalic and atraumatic.     Mouth/Throat:     Pharynx: No oropharyngeal exudate.  Eyes:     Pupils: Pupils are equal, round, and reactive to light.  Cardiovascular:     Rate and Rhythm: Regular rhythm.  Heart sounds: Normal heart sounds.  Pulmonary:     Effort: Pulmonary effort is normal. No respiratory distress.     Breath sounds: Normal breath sounds.  Abdominal:     General: Bowel sounds are normal. There is no distension.     Palpations: Abdomen is soft.     Tenderness: There is no abdominal tenderness.  Genitourinary:    Rectum: Tenderness and anal fissure present.     Comments: Prolapse noted.  Chaperoned by Marylynn nurse tech. Musculoskeletal:        General: No tenderness or deformity.     Cervical back: Normal range of motion.     Right lower leg: No edema.     Left lower leg: No edema.  Skin:    General: Skin is warm  and dry.  Neurological:     Mental Status: She is alert and oriented to person, place, and time.     (all labs ordered are listed, but only abnormal results are displayed) Labs Reviewed  POC OCCULT BLOOD, ED - Abnormal; Notable for the following components:      Result Value   Fecal Occult Bld POSITIVE (*)    All other components within normal limits  COMPREHENSIVE METABOLIC PANEL WITH GFR  CBC  POC OCCULT BLOOD, ED  TYPE AND SCREEN    EKG: None  Radiology: No results found.  {Document cardiac monitor, telemetry assessment procedure when appropriate:32947} Procedures   Medications Ordered in the ED - No data to display  Clinical Course as of 11/20/24 1505  Tue Nov 20, 2024  1504 Fecal Occult Blood, POC(!): POSITIVE [JS]    Clinical Course User Index [JS] Kaysi Ourada, PA-C   {Click here for ABCD2, HEART and other calculators REFRESH Note before signing:1}                              Medical Decision Making Amount and/or Complexity of Data Reviewed Labs: ordered.   Patient here with rectal bleeding x yesterday not anticoagulated. Hemoccult positive, prior hx of transfusion.  Patient evaluated 15 minutes before end of my shift. Lab work has not returned, will transfer care to incoming provider.  Pending further workup.     Portions of this note were generated with Scientist, clinical (histocompatibility and immunogenetics). Dictation errors may occur despite best attempts at proofreading.   Final diagnoses:  Rectal bleeding    ED Discharge Orders     None

## 2024-11-20 NOTE — ED Notes (Signed)
 Blood transfusion completed at this time.

## 2024-11-20 NOTE — ED Provider Triage Note (Signed)
 Emergency Medicine Provider Triage Evaluation Note  Mary Stephens , a 68 y.o. female  was evaluated in triage.  Pt complains of BRBPR. Sent by docotrr. Feeling lightheaded. Bled  like e a faucette all night.,  Review of Systems  Positive: Bleeding  Negative: fever  Physical Exam  BP 114/75 (BP Location: Left Arm)   Pulse 67   Temp 97.7 F (36.5 C) (Oral)   Resp 17   SpO2 100%  Gen:   Awake, no distress   Resp:  Normal effort  MSK:   Moves extremities without difficulty  Other:    Medical Decision Making  Medically screening exam initiated at 2:09 PM.  Appropriate orders placed.  Mary Stephens was informed that the remainder of the evaluation will be completed by another provider, this initial triage assessment does not replace that evaluation, and the importance of remaining in the ED until their evaluation is complete.     Arloa Chroman, PA-C 11/20/24 [phone]

## 2024-11-20 NOTE — H&P (Signed)
 History and Physical   Mary Stephens FMW:[id number] DOB: 07-27-1956 DOA: 11/20/2024  PCP: Cloria Annabella CROME, DO   Patient coming from: Home  Chief Complaint: Rectal bleeding  HPI: Mary Stephens is a 68 y.o. 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  Patient reports onset of GI bleeding last night.  Has had 3 episodes of large volume of blood in the bowl.  Reports not always associated with a bowel movement.  Saw PCP today who sent her to the ED.  Reports intermittent lightheadedness.  Denies fevers, chills, chest pain, shortness of breath, constipation, nausea, vomiting.  ED Course: Vital signs in the ED notable for blood pressure in the 110s-130 systolic.  Lab workup included CMP with chloride 113, bicarb 21, BUN 36, creatinine 4.4 which is increase of baseline of 3.5-4, glucose 107, protein 5.9, albumin  3.2.  CBC with hemoglobin 6.5 previous was 9 in our system but outside records show 7.9 a month ago.  FOBT positive.  Type and screen in the ED.  CT abdomen pelvis showed gallstones, cystic lesion bilateral kidneys, stable aortic dilation.  Ordered Ativan  and 2 units PRBCs in the ED.  GI being consulted by EDP.  Review of Systems: As per HPI otherwise all other systems reviewed and are negative.  Past Medical History:  Diagnosis Date   Anxiety    Arthritis    Cataract    forming- very small   Colon polyps    Depression    Diabetes mellitus without complication (HCC)    off meds for diabetes- diet controlled   GERD (gastroesophageal reflux disease)    H/O aortic dissection 2021   Hepatitis C    Hyperlipidemia    Hypertension    Neuropathy    feet    Past Surgical History:  Procedure Laterality Date   COLONOSCOPY  ?2001,2011?   in [place]  (1st colon 20 polyps removed-per pt)   IR THORACENTESIS ASP PLEURAL SPACE W/IMG GUIDE  12/09/2020   PARTIAL HYSTERECTOMY  2001   REPAIR OF ACUTE ASCENDING THORACIC AORTIC DISSECTION N/A 12/02/2020   Procedure: REPAIR OF ACUTE ASCENDING THORACIC AORTIC DISSECTION VIA CIRC ARREST USING HEMASHIELD PLATINUM 28 MM VASCULAR GRAFT.;  Surgeon: Shyrl Linnie KIDD, MD;  Location: [facility] OR;  Service: Vascular;  Laterality: N/A;  AXILLARY CANNULATION AND CIRC ARREST   REPAIR OF ACUTE ASCENDING THORACIC AORTIC DISSECTION VIA CIRC ARREST USING HEMASHIELD PLATINUM 28 MM VASCULAR GRAFT. (N/A)  12/02/2020   TEE WITHOUT CARDIOVERSION  12/02/2020   Procedure: TRANSESOPHAGEAL ECHOCARDIOGRAM (TEE);  Surgeon: Shyrl Linnie KIDD, MD;  Location: [facility] OR;  Service: Vascular;;   TRANSESOPHAGEAL ECHOCARDIOGRAM (CATH LAB) N/A 04/17/2024   Procedure: TRANSESOPHAGEAL ECHOCARDIOGRAM;  Surgeon: Shlomo Wilbert SAUNDERS, MD;  Location[facility] INVASIVE CV LAB;  Service: Cardiovascular;  Laterality: N/A;    Social History  reports that she has been smoking cigarettes. She has never used smokeless tobacco. She reports current alcohol use of about 1.0 standard drink of alcohol per week. She reports that she does not currently use drugs after having used the following drugs: Marijuana.  No Known Allergies  Family History  Problem Relation Age of Onset   Diabetes Mother    Colon cancer Neg Hx    Colon polyps Neg Hx    Esophageal cancer Neg Hx    Rectal cancer Neg Hx  Stomach cancer Neg Hx   Reviewed on admission  Prior to Admission medications   Medication Sig Start Date End Date Taking? Authorizing Provider  acetaminophen  (TYLENOL ) 500 MG tablet Take 500-1,000 mg by mouth every 8 (eight) hours as needed (for pain).    [provider]  amLODipine  (NORVASC ) 10 MG tablet Take 1 tablet (10 mg total) by mouth daily. 04/03/24   Krishnan, Gokul, MD  aspirin  EC 81 MG tablet Take 1 tablet (81 mg total) by mouth daily. Swallow whole.  05/10/24   Meng, Hao, PA  atorvastatin  (LIPITOR) 40 MG tablet Take 40 mg by mouth at bedtime.    [provider]  furosemide  (LASIX ) 40 MG tablet Take 1 tablet (40 mg total) by mouth daily. 05/02/24 06/01/24  Perri DELENA Meliton Mickey., MD  gabapentin  (NEURONTIN ) 300 MG capsule Take 300 mg by mouth daily.    [provider]  hydrALAZINE  (APRESOLINE ) 100 MG tablet Take 100 mg by mouth 3 (three) times daily.    [provider]  metoprolol  tartrate (LOPRESSOR ) 50 MG tablet Take 50 mg by mouth 2 (two) times daily.    [provider]  glipiZIDE  (GLUCOTROL  XL) 5 MG 24 hr tablet Take 1 tablet (5 mg total) by mouth daily with breakfast. Patient not taking: Reported on 06/20/2020 01/27/18 06/20/20  Theotis Haze ORN, NP    Physical Exam: Vitals:   11/20/24 1728 11/20/24 1730 11/20/24 1745 11/20/24 1747  BP:  (!) 142/88 123/86   Pulse:  72 72   Resp:      Temp:    98.6 F (37 C)  TempSrc:    Oral  SpO2: 100% 100% 100%   Weight:      Height:        Physical Exam Constitutional:      General: She is not in acute distress.    Appearance: Normal appearance.  HENT:     Head: Normocephalic and atraumatic.     Mouth/Throat:     Mouth: Mucous membranes are moist.     Pharynx: Oropharynx is clear.  Eyes:     Extraocular Movements: Extraocular movements intact.     Pupils: Pupils are equal, round, and reactive to light.  Cardiovascular:     Rate and Rhythm: Normal rate and regular rhythm.     Pulses: Normal pulses.     Heart sounds: Normal heart sounds.  Pulmonary:     Effort: Pulmonary effort is normal. No respiratory distress.     Breath sounds: Normal breath sounds.  Abdominal:     General: Bowel sounds are normal. There is no distension.     Palpations: Abdomen is soft.     Tenderness: There is no abdominal tenderness.  Musculoskeletal:        General: No swelling or deformity.  Skin:    General: Skin is warm and dry.  Neurological:     General: No focal  deficit present.     Mental Status: Mental status is at baseline.    Labs on Admission: I have personally reviewed following labs and imaging studies  CBC: Recent Labs  Lab 11/20/24 1417  WBC 4.8  HGB 6.5*  HCT 21.2*  MCV 101.9*  PLT 157    Basic Metabolic Panel: Recent Labs  Lab 11/20/24 1417  NA 142  K 3.9  CL 113*  CO2 21*  GLUCOSE 107*  BUN 36*  CREATININE 4.44*  CALCIUM  9.0    GFR: Estimated Creatinine Clearance: 11.4 mL/min (A) (by C-G formula  based on SCr of 4.44 mg/dL (H)).  Liver Function Tests: Recent Labs  Lab 11/20/24 1417  AST 15  ALT 7  ALKPHOS 61  BILITOT 0.7  PROT 5.9*  ALBUMIN  3.3*    Urine analysis:    Component Value Date/Time   COLORURINE YELLOW 05/25/2024 1705   APPEARANCEUR CLOUDY (A) 05/25/2024 1705   LABSPEC 1.010 05/25/2024 1705   PHURINE 5.0 05/25/2024 1705   GLUCOSEU NEGATIVE 05/25/2024 1705   HGBUR NEGATIVE 05/25/2024 1705   BILIRUBINUR NEGATIVE 05/25/2024 1705   KETONESUR NEGATIVE 05/25/2024 1705   PROTEINUR 30 (A) 05/25/2024 1705   NITRITE NEGATIVE 05/25/2024 1705   LEUKOCYTESUR TRACE (A) 05/25/2024 1705    Radiological Exams on Admission: No results found.  EKG: Not performed in the emergency department  Assessment/Plan Principal Problem:   Acute GI bleeding Active Problems:   Chronic hepatitis C virus infection (HCC)   DM (diabetes mellitus), type 2 (HCC)   Essential hypertension   CKD (chronic kidney disease) stage 4, GFR 15-29 ml/min (HCC)   HLD (hyperlipidemia)   GERD (gastroesophageal reflux disease)   GI bleed Acute on chronic anemia > Patient presenting with GI bleeding starting last night.  Several episodes of large volume of blood in the bowl.  Not always associated with a bowel movement. > Hemoglobin 6.5 in the ED.  Was 7.9 four weeks ago per outside chart > Patient reports history of internal hemorrhoids with prior bleeding. > CT on pelvis without acute normality.  MR angio pending. > EDP  consulting GI.  2 units ordered for transfusion. - Monitor in progressive unit overnight - Appreciate GI recommendations and assistance - Continue with 2 units transfusion - Trend hemoglobin, further transfusions as needed - Supportive care  AKI on CKD 4 > Creatinine 4.4.  Recent labs show variability in creatinine from 3.5-4.3.  The most recent labs 4 weeks ago from outside chart are 3.4. > In the setting of bleeding, is receiving 2 units transfusion.  Will add gentle fluids. - Gentle IV fluids - Trend renal function and electrolytes  Hypertension - Hold antihypertensives in the setting of active bleed  Hyperlipidemia - Continue home atorvastatin   Diabetes - No longer on medication for this  History of hepatitis C > Completed treatment with Harvoni  - Noted  History of aortic valve replacement History of ascending thoracic aneurysm with dissection status post repair - Noted  DVT prophylaxis: SCDs Code Status:   Full Family Communication:  None on admission  Disposition Plan:   Patient is from:  Home  Anticipated DC to:  Home  Anticipated DC date:  2 to 4 days  Anticipated DC barriers: None  Consults called:  Gastroenterology Admission status:  Inpatient, progressive  Severity of Illness: The appropriate patient status for this patient is INPATIENT. Inpatient status is judged to be reasonable and necessary in order to provide the required intensity of service to ensure the patient's safety. The patient's presenting symptoms, physical exam findings, and initial radiographic and laboratory data in the context of their chronic comorbidities is felt to place them at high risk for further clinical deterioration. Furthermore, it is not anticipated that the patient will be medically stable for discharge from the hospital within 2 midnights of admission.   * I certify that at the point of admission it is my clinical judgment that the patient will require inpatient hospital care  spanning beyond 2 midnights from the point of admission due to high intensity of service, high risk for further deterioration and  high frequency of surveillance required.Mary Marsa KATHEE Seena MD [facility]  How to contact the [facility] Attending or Consulting provider 7A - 7P or covering provider during after hours 7P -7A, for this patient?   Check the care team in [facility] and look for a) attending/consulting [facility] provider listed and b) the [facility] team listed Log into [email or web address] and use 's universal password to access. If you do not have the password, please contact the hospital operator. Locate the [facility] provider you are looking for under [facility] and page to a number that you can be directly reached. If you still have difficulty reaching the provider, please page the [facility] (Director on Call) for the Hospitalists listed on amion for assistance.  11/20/2024, 5:58 PM

## 2024-11-20 NOTE — ED Provider Notes (Signed)
 Received patient in signout from previous provider pending CTA, disposition.  See her note.  In short, patient presents to emergency department via EMS for evaluation of spontaenous gushing rectal bleeding.  Breathing is also described as a faucet has been open and all the bleeding suddenly poured out.  She has received a blood transfusion in the past.  No thinners.  ED workup notable for Hgb of 6.5 and fecal occult positive.  Hemoglobin trends at 8.6-9.6 over the past 6 months likely secondary to anemia of chronic disease from CKD.  Creatinine 4.44 and baseline has been downtrending from 3.6-4.29 over past 6 months.  Not currently on dialysis.   Physical Exam  BP 114/75 (BP Location: Left Arm)   Pulse 67   Temp 97.7 F (36.5 C) (Oral)   Resp 17   SpO2 100%    Procedures  .Critical Care  Performed by: Minnie Tinnie BRAVO, PA Authorized by: Minnie Tinnie BRAVO, PA   Critical care provider statement:    Critical care time (minutes):  31   Critical care was necessary to treat or prevent imminent or life-threatening deterioration of the following conditions:  Circulatory failure   Critical care was time spent personally by me on the following activities:  Discussions with consultants, development of treatment plan with patient or surrogate, evaluation of patient's response to treatment, examination of patient, ordering and performing treatments and interventions, ordering and review of laboratory studies, ordering and review of radiographic studies, pulse oximetry and re-evaluation of patient's condition   Care discussed with: admitting provider     ED Course / MDM   Clinical Course as of 11/20/24 1519  Tue Nov 20, 2024  1504 Fecal Occult Blood, POC(!): POSITIVE [JS]    Clinical Course User Index [JS] Soto, Johana, PA-C   Medical Decision Making Amount and/or Complexity of Data Reviewed Labs: ordered. Decision-making details documented in ED Course. Radiology:  ordered.  Risk Prescription drug management. Decision regarding hospitalization.   Attempted to obtain CTA to find possible source of rectal bleeding however, patient's GFR is 10 and she is not currently on dialysis. Discussed with attending Dr Melvenia who recommended MRA. Will consult hospitalist for admission while awaiting MRA . Consulted hospitalist Dr. Seena and reviewed ED workup, dispo. He accepts patient for admission  Last seen [facility] in 2019 by Dr. Norleen Kiang.  He has a history of colonic polyps, intermittent rectal bleeding.  He has a history of anemia secondary to hemorrhoids requiring 1 unit transfusion on 10/22/2024.  Last had endoscopy by 10/18/2024 by [facility] due to concern for esophageal stenosis inability to pass TEE.  Last saw [facility] in 2019 Norleen Kiang. Hx of colonic polyps, intermittent rectal bleeding. Last endoscopy on 10/18/24 by [facility] d/t concern for esophageal stenosis and inability to pass TEE during admission for valve insufficiency. Results: no esophageal stenosis. Grade A reflux esophagitis with no bleeding. Recommended for daily ppi.  Biopsy with no significant pathological abnormalities  Consulted GI Dr. Albertus with [facility] who will see patient tomorrow  Discussed ED workup, disposition with patient expressed understanding to plan.  All questions answered to her satisfaction.   Minnie Tinnie BRAVO, PA 11/20/24 2125    Melvenia Motto, MD 11/20/24 2350

## 2024-11-21 ENCOUNTER — Encounter (HOSPITAL_COMMUNITY): Payer: Self-pay | Admitting: Internal Medicine

## 2024-11-21 ENCOUNTER — Inpatient Hospital Stay (HOSPITAL_COMMUNITY): Payer: Medicare (Managed Care)

## 2024-11-21 DIAGNOSIS — K6289 Other specified diseases of anus and rectum: Secondary | ICD-10-CM | POA: Diagnosis not present

## 2024-11-21 DIAGNOSIS — C218 Malignant neoplasm of overlapping sites of rectum, anus and anal canal: Secondary | ICD-10-CM | POA: Diagnosis not present

## 2024-11-21 DIAGNOSIS — D62 Acute posthemorrhagic anemia: Secondary | ICD-10-CM | POA: Diagnosis not present

## 2024-11-21 DIAGNOSIS — K625 Hemorrhage of anus and rectum: Secondary | ICD-10-CM

## 2024-11-21 DIAGNOSIS — K922 Gastrointestinal hemorrhage, unspecified: Secondary | ICD-10-CM | POA: Diagnosis not present

## 2024-11-21 DIAGNOSIS — D649 Anemia, unspecified: Secondary | ICD-10-CM

## 2024-11-21 LAB — COMPREHENSIVE METABOLIC PANEL WITH GFR
ALT: 7 U/L (ref 0–44)
AST: 12 U/L — ABNORMAL LOW (ref 15–41)
Albumin: 3 g/dL — ABNORMAL LOW (ref 3.5–5.0)
Alkaline Phosphatase: 52 U/L (ref 38–126)
Anion gap: 7 (ref 5–15)
BUN: 35 mg/dL — ABNORMAL HIGH (ref 8–23)
CO2: 22 mmol/L (ref 22–32)
Calcium: 8.3 mg/dL — ABNORMAL LOW (ref 8.9–10.3)
Chloride: 111 mmol/L (ref 98–111)
Creatinine, Ser: 4.52 mg/dL — ABNORMAL HIGH (ref 0.44–1.00)
GFR, Estimated: 10 mL/min — ABNORMAL LOW (ref >60)
Glucose, Bld: 84 mg/dL (ref 70–99)
Potassium: 3.4 mmol/L — ABNORMAL LOW (ref 3.5–5.1)
Sodium: 140 mmol/L (ref 135–145)
Total Bilirubin: 0.8 mg/dL (ref 0.0–1.2)
Total Protein: 5.5 g/dL — ABNORMAL LOW (ref 6.5–8.1)

## 2024-11-21 LAB — CBC
HCT: 24.3 % — ABNORMAL LOW (ref 36.0–46.0)
Hemoglobin: 7.9 g/dL — ABNORMAL LOW (ref 12.0–15.0)
MCH: 30.5 pg (ref 26.0–34.0)
MCHC: 32.5 g/dL (ref 30.0–36.0)
MCV: 93.8 fL (ref 80.0–100.0)
Platelets: 131 K/uL — ABNORMAL LOW (ref 150–400)
RBC: 2.59 MIL/uL — ABNORMAL LOW (ref 3.87–5.11)
RDW: 18.4 % — ABNORMAL HIGH (ref 11.5–15.5)
WBC: 4.6 K/uL (ref 4.0–10.5)
nRBC: 0 % (ref 0.0–0.2)

## 2024-11-21 LAB — HIV ANTIBODY (ROUTINE TESTING W REFLEX): HIV Screen 4th Generation wRfx: NONREACTIVE

## 2024-11-21 MED ORDER — IOHEXOL 350 MG/ML SOLN
75.0000 mL | Freq: Once | INTRAVENOUS | Status: AC | PRN
  Administered 2024-11-21: 75 mL via INTRAVENOUS

## 2024-11-21 NOTE — Consult Note (Signed)
 Consultation Note   Referring Provider:   [facility] PCP: Cloria Annabella CROME, DO Primary Gastroenterologist:    Norleen Kiang, MD     Reason for Consultation: GI bleed DOA: 11/20/2024         Hospital Day: 2   ASSESSMENT    68 year old female with chronic, intermittent rectal bleeding over the last year felt to be hemorrhoidal.  Recently hospitalized at [facility] for evaluation of severe mitral regurgitation, had large-volume hematochezia during that admission requiring blood transfusion and IV iron for acute on chronic anemia.  Presented to this ED yesterday with recurrent painless rectal bleeding and recurrent acute on chronic anemia.  There is a firm perianal mass on exam.  Could be thrombosed hemorrhoid but looks atypical so more concerned about neoplasm.   Recurrent acute on chronic anemia  in setting of ongoing rectal bleeding Presenting hemoglobin 6.5, down from 7.9 a month ago at [facility].  Improvement in hemoglobin (though less than expected) with 2 units of blood.  Her hemoglobin is 7.9 this a.m..   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  However TEE did not demonstrate severe MR.   Thrombocytopenia Platelets 131  History of aortic dissection status post repair 2021  Uncontrolled hypertension  Hepatitis C(treated)  CKD4  See [facility] for any additional medical history  / medical problems  Principal Problem:   Acute GI bleeding Active Problems:   History of hepatitis C   DM (diabetes mellitus), type 2 (HCC)   Essential hypertension   HLD (hyperlipidemia)   Acute renal failure superimposed on stage 4 chronic kidney disease (HCC)   History of aortic dissection   GERD (gastroesophageal reflux  disease)    PLAN:   --Patient needs a full colonoscopy but I think it is unlikely that she will tolerate a prep today.  She was having rectal discomfort even when I attempted a DRE.  She needs evaluation of what appears to be a rectal mass.  This needs to be sorted out, could be just a thrombosed hemorrhoid but very atypical looking.  Will take her down for an unprepped flexible sigmoidoscopy today.  The risks and benefits of sigmoidoscopy with possible biopsies were discussed and the patient agrees to proceed.  --Monitor H&H, transfuse hgb<7 --Repeat iron studies  HPI   Brief History:   Patient was recently hospitalized at [facility] to undergo surgical intervention for severe MR and moderate AI.SABRA  Records reviewed in Care Everywhere . Due to history of inability to insert TEE probe in May 2025, anesthesia requested further workup prior to the OR.  GI was performed and upper endoscopy was done with esophageal biopsies... She underwent the TEE on 11/10 which did not demonstrate significant valvular pathology so decision was made to postpone surgery.  During that admission patient had lower GI bleeding with acute blood loss anemia.  Bleeding felt to be secondary to hemorrhoids and she was prescribed Anusol .   Her hemoglobin in August, prior to that admission was 10.8 .   With the bleeding  she got down as low as 7.3 .  She was iron deficient with a ferritin of 16. She received  a blood transfusion, also received IV iron.  she was discharged home on 10/23/2024 with 14 days of oral iron.  On the day of admission her hemoglobin was 7.9                                                    Interval History:   Patient brought to ED by EMS from PCPs office yesterday for recurrent lower GI bleeding.  Patient tells me she has had intermittent rectal bleeding for a last year.  About every 3 weeks she has what she describes as large volume rectal bleeding.  She never knows when this occurs. Bleeding is not associated  with any straining or constipation.  She has no associated abdominal pain.  No rectal pain to speak of just some discomfort and sensation of bulging and pressure near the rectum. She is not anticoagulated , takes a daily baby aspirin  but no other NSAIDs.  Her last colonoscopy was greater than 10 years ago.  Since leaving [facility] she has been taking oral iron but does not recall using the Anusol  or other hemorrhoid preparation prescribed during that admission.  Ms. Angst has no other complaints.  Her weight has been stable at home  In the ED she  is hemodynamically stable.  Hemoglobin  6.5, down from 7.9 when she left [facility] a month ago.  Potassium 3.4, renal function is around her baseline   Pertinent GI Studies   EGD 10/22/2024-[facility]  (inpatient) - Food in the upper third of the esophagus and in the middle third of the esophagus. Biopsied to evaluate for candida. - No signs of esophageal stenosis or other mucosal abnormalities on examination - LA Grade A reflux esophagitis with no bleeding. - Normal cardia, gastric fundus, gastric body, greater curvature of the stomach and lesser curvature of the stomach. - A few raised, umbilical mucosal abnormalities noted in the antrum. One suspicious for aberrant pancreas. Biopsied. - Nodular mucosa in the duodenal bulb. Biopsied. - Normal second portion of the duodenum.  A. Duodenum, endoscopic biopsy: Duodenal mucosa with peptic duodenitis and focal prominent reactive epithelial changes.   B. Stomach, antrum, endoscopic biopsy: Gastric antral mucosa with goblet cell intestinal metaplasia (complete type), chronic inactive gastritis, focal reactive changes suggestive of adjacent erosion.  Negative for dysplasia. Immunohistochemical stain for Helicobacter pylori is pending; results will be issued in an addendum.   C. Esophagus, endoscopic biopsy: Squamous mucosa with no significant pathologic change   Labs and Imaging:  Recent Labs    11/20/24 1417  11/21/24 0316  PROT 5.9* 5.5*  ALBUMIN  3.3* 3.0*  AST 15 12*  ALT 7 7  ALKPHOS 61 52  BILITOT 0.7 0.8   Recent Labs    11/20/24 1417 11/21/24 0316  WBC 4.8 4.6  HGB 6.5* 7.9*  HCT 21.2* 24.3*  MCV 101.9* 93.8  PLT 157 131*   Recent Labs    11/20/24 1417 11/21/24 0316  NA 142 140  K 3.9 3.4*  CL 113* 111  CO2 21* 22  GLUCOSE 107* 84  BUN 36* 35*  CREATININE 4.44* 4.52*  CALCIUM  9.0 8.3*     CT ABDOMEN PELVIS WO CONTRAST CLINICAL DATA:  Rectal bleeding  EXAM: CT ABDOMEN AND  PELVIS WITHOUT CONTRAST  TECHNIQUE: Multidetector CT imaging of the abdomen and pelvis was performed following the standard protocol without IV contrast.  RADIATION DOSE REDUCTION: This exam was performed according to the departmental dose-optimization program which includes automated exposure control, adjustment of the mA and/or kV according to patient size and/or use of iterative reconstruction technique.  COMPARISON:  None Available.  FINDINGS: Lower chest: No acute abnormality.  Hepatobiliary: Small gallstones are noted. Liver is within normal limits.  Pancreas: Unremarkable. No pancreatic ductal dilatation or surrounding inflammatory changes.  Spleen: Normal in size without focal abnormality.  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 Left kidney shows atrophy with multiple hyperdense and hypodense cysts. Right kidney shows similar findings although no atrophy is seen. No obstructive changes are noted. The bladder is partially distended.  Stomach/Bowel: No obstructive or inflammatory changes of the colon are seen. The appendix is within normal limits. Scattered fecal material is noted throughout the colon. Small bowel and stomach are within normal limits.  Vascular/Lymphatic: Prominent proximal abdominal aorta is noted related to prior dissection and tortuosity. The dissection is not well appreciated due to lack of IV contrast  although some intimal calcification is noted within the mid aorta stable from the prior exam. No distal dilatation of the aorta is seen. Iliac calcifications are noted. No adenopathy is seen.  Reproductive: Status post hysterectomy. No adnexal masses.  Other: No abdominal wall hernia or abnormality. No abdominopelvic ascites.  Musculoskeletal: No acute or significant osseous findings.  IMPRESSION: Cholelithiasis without complicating factors.  No abnormality is identified to correspond with the given clinical history of rectal bleeding.  Hyperdense and hypodense cystic lesions are identified within both kidneys. These changes are stable from prior ultrasound from 04/01/2024.  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 No acute abnormality is noted.  Electronically Signed   By: Oneil Devonshire M.D.   On: 05/25/2024 19:17     Past Medical History:  Diagnosis Date   Anxiety    Arthritis    Cataract    forming- very small   Colon polyps    Depression    Diabetes mellitus without complication (HCC)    off meds for diabetes- diet controlled   GERD (gastroesophageal reflux disease)    H/O aortic dissection 2021   Hepatitis C    Hyperlipidemia    Hypertension    Neuropathy    feet    Past Surgical History:  Procedure Laterality Date   COLONOSCOPY  ?2001,2011?   in [place] (1st colon 20 polyps removed-per pt)   IR THORACENTESIS ASP PLEURAL SPACE W/IMG GUIDE  12/09/2020   PARTIAL HYSTERECTOMY  2001   REPAIR OF ACUTE ASCENDING THORACIC AORTIC DISSECTION N/A 12/02/2020   Procedure: REPAIR OF ACUTE ASCENDING THORACIC AORTIC DISSECTION VIA CIRC ARREST USING HEMASHIELD PLATINUM 28 MM VASCULAR GRAFT.;  Surgeon: Shyrl Linnie KIDD, MD;  Location: [facility] OR;  Service: Vascular;  Laterality: N/A;  AXILLARY CANNULATION AND CIRC ARREST   REPAIR OF ACUTE ASCENDING THORACIC AORTIC DISSECTION VIA CIRC ARREST USING HEMASHIELD PLATINUM 28 MM VASCULAR GRAFT.  (N/A)  12/02/2020   TEE WITHOUT CARDIOVERSION  12/02/2020   Procedure: TRANSESOPHAGEAL ECHOCARDIOGRAM (TEE);  Surgeon: Shyrl Linnie KIDD, MD;  Location: [facility] OR;  Service: Vascular;;   TRANSESOPHAGEAL ECHOCARDIOGRAM (CATH LAB) N/A 04/17/2024   Procedure: TRANSESOPHAGEAL ECHOCARDIOGRAM;  Surgeon: Shlomo Wilbert SAUNDERS, MD;  Location: [facility] INVASIVE CV LAB;  Service: Cardiovascular;  Laterality: N/A;    Family History  Problem Relation Age  of Onset   Diabetes Mother    Colon cancer Neg Hx    Colon polyps Neg Hx    Esophageal cancer Neg Hx    Rectal cancer Neg Hx    Stomach cancer Neg Hx     Prior to Admission medications   Medication Sig Start Date End Date Taking? Authorizing Provider  acetaminophen  (TYLENOL ) 500 MG tablet Take 500-1,000 mg by mouth every 8 (eight) hours as needed (for pain).   Yes [provider]  amLODipine  (NORVASC ) 10 MG tablet Take 1 tablet (10 mg total) by mouth daily. 04/03/24  Yes Krishnan, Gokul, MD  aspirin  EC 81 MG tablet Take 1 tablet (81 mg total) by mouth daily. Swallow whole. 05/10/24  Yes Meng, Hao, PA  atorvastatin  (LIPITOR) 40 MG tablet Take 40 mg by mouth at bedtime.   Yes [provider]  gabapentin  (NEURONTIN ) 300 MG capsule Take 300 mg by mouth daily.   Yes [provider]  hydrALAZINE  (APRESOLINE ) 100 MG tablet Take 100 mg by mouth 3 (three) times daily.   Yes [provider]  metoprolol  tartrate (LOPRESSOR ) 50 MG tablet Take 50 mg by mouth 2 (two) times daily.   Yes [provider]  furosemide  (LASIX ) 40 MG tablet Take 1 tablet (40 mg total) by mouth daily. 05/02/24 06/01/24  Perri DELENA Meliton Mickey., MD  glipiZIDE  (GLUCOTROL  XL) 5 MG 24 hr tablet Take 1 tablet (5 mg total) by mouth daily with breakfast. Patient not taking: Reported on 06/20/2020 01/27/18 06/20/20  Fleming, Zelda W, NP    Current Facility-Administered Medications  Medication Dose Route Frequency Provider Last Rate Last Admin   0.9 %  sodium chloride   infusion   Intravenous Continuous Melvin, Alexander B, MD 75 mL/hr at 11/21/24 9344 Restarted at 11/21/24 9344   acetaminophen  (TYLENOL ) tablet 650 mg  650 mg Oral Q6H PRN Seena Marsa NOVAK, MD       Or   acetaminophen  (TYLENOL ) suppository 650 mg  650 mg Rectal Q6H PRN Seena Marsa NOVAK, MD       LORazepam  (ATIVAN ) tablet 0.5 mg  0.5 mg Oral PRN Baron, Lauren E, PA       polyethylene glycol (MIRALAX  / GLYCOLAX ) packet 17 g  17 g Oral Daily PRN Seena Marsa NOVAK, MD       sodium chloride  flush (NS) 0.9 % injection 3 mL  3 mL Intravenous Q12H Seena Marsa NOVAK, MD   3 mL at 11/20/24 2215   Current Outpatient Medications  Medication Sig Dispense Refill   acetaminophen  (TYLENOL ) 500 MG tablet Take 500-1,000 mg by mouth every 8 (eight) hours as needed (for pain).     amLODipine  (NORVASC ) 10 MG tablet Take 1 tablet (10 mg total) by mouth daily. 30 tablet 1   aspirin  EC 81 MG tablet Take 1 tablet (81 mg total) by mouth daily. Swallow whole. 90 tablet 3   atorvastatin  (LIPITOR) 40 MG tablet Take 40 mg by mouth at bedtime.     gabapentin  (NEURONTIN ) 300 MG capsule Take 300 mg by mouth daily.     hydrALAZINE  (APRESOLINE ) 100 MG tablet Take 100 mg by mouth 3 (three) times daily.     metoprolol  tartrate (LOPRESSOR ) 50 MG tablet Take 50 mg by mouth 2 (two) times daily.     furosemide  (LASIX ) 40 MG tablet Take 1 tablet (40 mg total) by mouth daily. 30 tablet 1    Allergies as of 11/20/2024   (No Known Allergies)    Social History  Socioeconomic History   Marital status: Single    Spouse name: Not on file   Number of children: 1   Years of education: Not on file   Highest education level: Not on file  Occupational History   Occupation: unemployed  Tobacco Use   Smoking status: Every Day    Current packs/day: 0.25    Types: Cigarettes   Smokeless tobacco: Never   Tobacco comments:    admits to cuttting back 4 cigarettes/day  Vaping Use   Vaping status: Never Used  Substance and  Sexual Activity   Alcohol use: Yes    Alcohol/week: 1.0 standard drink of alcohol    Types: 1 Standard drinks or equivalent per week    Comment: wine cooler   Drug use: Not Currently    Types: Marijuana   Sexual activity: Not on file  Other Topics Concern   Not on file  Social History Narrative   Lives alone.  One daughter.    Social Drivers of Corporate Investment Banker Strain: Low Risk  (10/16/2024)   Received from [facility]   Overall Financial Resource Strain (CARDIA)    Difficulty of Paying Living Expenses: Not hard at all  Food Insecurity: No Food Insecurity (10/16/2024)   Received from [facility]   Hunger Vital Sign    Within the past 12 months, you worried that your food would run out before you got the money to buy more.: Never true    Within the past 12 months, the food you bought just didn't last and you didn't have money to get more.: Never true  Transportation Needs: No Transportation Needs (10/16/2024)   Received [facility] - [facility]    In the past 12 months, has lack of transportation kept you from medical appointments or from getting medications?: No    Lack of Transportation (Non-Medical): No  Physical Activity: Not on file  Stress: Not on file  Social Connections: Socially Isolated (05/01/2024)   Social Connection and Isolation Panel    Frequency of Communication with Friends and Family: Three times a week    Frequency of Social Gatherings with Friends and Family: Twice a week    Attends Religious Services: Never    Database Administrator or Organizations: No    Attends Banker Meetings: Never    Marital Status: Never married  Intimate Partner Violence: Not At Risk (05/01/2024)   Humiliation, Afraid, Rape, and Kick questionnaire    Fear of Current or Ex-Partner: No    Emotionally Abused: No    Physically Abused: No    Sexually Abused: No     Code Status   Code  Status: Full Code  Review of Systems: All systems reviewed and negative except where noted in HPI.  Physical Exam: Vital signs in last 24 hours: Temp:  [97.5 F (36.4 C)-98.9 F (37.2 C)] 98.9 F (37.2 C) (12/10 0050) Pulse Rate:  [62-79] 71 (12/10 0645) Resp:  [11-25] 19 (12/10 0645) BP: (104-156)/(56-121) 127/56 (12/10 0600) SpO2:  [100 %] 100 % (12/10 0645) Weight:  [61.2 kg] 61.2 kg (12/09 1728)    General:  Pleasant female in NAD Psych:  Cooperative. Normal mood and affect Eyes: Pupils equal Ears:  Normal auditory acuity Nose: No deformity, discharge or lesions Neck:  Supple, no masses felt Lungs:  Clear to auscultation.  Heart:  Regular rate, regular rhythm.  Abdomen:  Soft, nondistended, nontender, active bowel sounds,  no masses felt Rectal :  Large, round, quarter size, firm, nodular perianal mass Msk: Symmetrical without gross deformities.  Neurologic:  Alert, oriented, grossly normal neurologically Extremities : No edema Skin:  Intact without significant lesions.    Intake/Output from previous day: No intake/output data recorded. Intake/Output this shift:  No intake/output data recorded.   Vina Dasen, NP-C   11/21/2024, 8:39 AM

## 2024-11-21 NOTE — H&P (View-Only) (Signed)
 Consultation Note   Referring Provider:   [facility] PCP: Cloria Annabella CROME, DO Primary Gastroenterologist:    Norleen Kiang, MD     Reason for Consultation: GI bleed DOA: 11/20/2024         Hospital Day: 2   ASSESSMENT    68 year old female with chronic, intermittent rectal bleeding over the last year felt to be hemorrhoidal.  Recently hospitalized at [facility] for evaluation of severe mitral regurgitation, had large-volume hematochezia during that admission requiring blood transfusion and IV iron for acute on chronic anemia.  Presented to this ED yesterday with recurrent painless rectal bleeding and recurrent acute on chronic anemia.  There is a firm perianal mass on exam.  Could be thrombosed hemorrhoid but looks atypical so more concerned about neoplasm.   Recurrent acute on chronic anemia  in setting of ongoing rectal bleeding Presenting hemoglobin 6.5, down from 7.9 a month ago at [facility].  Improvement in hemoglobin (though less than expected) with 2 units of blood.  Her hemoglobin is 7.9 this a.m..   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  However TEE did not demonstrate severe MR.   Thrombocytopenia Platelets 131  History of aortic dissection status post repair 2021  Uncontrolled hypertension  Hepatitis C(treated)  CKD4  See PMH for any additional medical history  / medical problems  Principal Problem:   Acute GI bleeding Active Problems:   History of hepatitis C   DM (diabetes mellitus), type 2 (HCC)   Essential hypertension   HLD (hyperlipidemia)   Acute renal failure superimposed on stage 4 chronic kidney disease (HCC)   History of aortic dissection   GERD (gastroesophageal reflux  disease)    PLAN:   --Patient needs a full colonoscopy but I think it is unlikely that she will tolerate a prep today.  She was having rectal discomfort even when I attempted a DRE.  She needs evaluation of what appears to be a rectal mass.  This needs to be sorted out, could be just a thrombosed hemorrhoid but very atypical looking.  Will take her down for an unprepped flexible sigmoidoscopy today.  The risks and benefits of sigmoidoscopy with possible biopsies were discussed and the patient agrees to proceed.  --Monitor H&H, transfuse hgb<7 --Repeat iron studies  HPI   Brief History:   Patient was recently hospitalized at [facility] to undergo surgical intervention for severe MR and moderate AI.SABRA  Records reviewed in Care Everywhere . Due to history of inability to insert TEE probe in May 2025, anesthesia requested further workup prior to the OR.  GI was performed and upper endoscopy was done with esophageal biopsies... She underwent the TEE on 11/10 which did not demonstrate significant valvular pathology so decision was made to postpone surgery.  During that admission patient had lower GI bleeding with acute blood loss anemia.  Bleeding felt to be secondary to hemorrhoids and she was prescribed Anusol .   Her hemoglobin in August, prior to that admission was 10.8 .   With the bleeding  she got down as low as 7.3 .  She was iron deficient with a ferritin of 16. She received  a blood transfusion, also received IV iron.  she was discharged home on 10/23/2024 with 14 days of oral iron.  On the day of admission her hemoglobin was 7.9                                                    Interval History:   Patient brought to ED by EMS from PCPs office yesterday for recurrent lower GI bleeding.  Patient tells me she has had intermittent rectal bleeding for a last year.  About every 3 weeks she has what she describes as large volume rectal bleeding.  She never knows when this occurs. Bleeding is not associated  with any straining or constipation.  She has no associated abdominal pain.  No rectal pain to speak of just some discomfort and sensation of bulging and pressure near the rectum. She is not anticoagulated , takes a daily baby aspirin  but no other NSAIDs.  Her last colonoscopy was greater than 10 years ago.  Since leaving [facility] she has been taking oral iron but does not recall using the Anusol  or other hemorrhoid preparation prescribed during that admission.  Ms. Angst has no other complaints.  Her weight has been stable at home  In the ED she  is hemodynamically stable.  Hemoglobin  6.5, down from 7.9 when she left [facility] a month ago.  Potassium 3.4, renal function is around her baseline   Pertinent GI Studies   EGD 10/22/2024-[facility]  (inpatient) - Food in the upper third of the esophagus and in the middle third of the esophagus. Biopsied to evaluate for candida. - No signs of esophageal stenosis or other mucosal abnormalities on examination - LA Grade A reflux esophagitis with no bleeding. - Normal cardia, gastric fundus, gastric body, greater curvature of the stomach and lesser curvature of the stomach. - A few raised, umbilical mucosal abnormalities noted in the antrum. One suspicious for aberrant pancreas. Biopsied. - Nodular mucosa in the duodenal bulb. Biopsied. - Normal second portion of the duodenum.  A. Duodenum, endoscopic biopsy: Duodenal mucosa with peptic duodenitis and focal prominent reactive epithelial changes.   B. Stomach, antrum, endoscopic biopsy: Gastric antral mucosa with goblet cell intestinal metaplasia (complete type), chronic inactive gastritis, focal reactive changes suggestive of adjacent erosion.  Negative for dysplasia. Immunohistochemical stain for Helicobacter pylori is pending; results will be issued in an addendum.   C. Esophagus, endoscopic biopsy: Squamous mucosa with no significant pathologic change   Labs and Imaging:  Recent Labs    11/20/24 1417  11/21/24 0316  PROT 5.9* 5.5*  ALBUMIN  3.3* 3.0*  AST 15 12*  ALT 7 7  ALKPHOS 61 52  BILITOT 0.7 0.8   Recent Labs    11/20/24 1417 11/21/24 0316  WBC 4.8 4.6  HGB 6.5* 7.9*  HCT 21.2* 24.3*  MCV 101.9* 93.8  PLT 157 131*   Recent Labs    11/20/24 1417 11/21/24 0316  NA 142 140  K 3.9 3.4*  CL 113* 111  CO2 21* 22  GLUCOSE 107* 84  BUN 36* 35*  CREATININE 4.44* 4.52*  CALCIUM  9.0 8.3*     CT ABDOMEN PELVIS WO CONTRAST CLINICAL DATA:  Rectal bleeding  EXAM: CT ABDOMEN AND  PELVIS WITHOUT CONTRAST  TECHNIQUE: Multidetector CT imaging of the abdomen and pelvis was performed following the standard protocol without IV contrast.  RADIATION DOSE REDUCTION: This exam was performed according to the departmental dose-optimization program which includes automated exposure control, adjustment of the mA and/or kV according to patient size and/or use of iterative reconstruction technique.  COMPARISON:  None Available.  FINDINGS: Lower chest: No acute abnormality.  Hepatobiliary: Small gallstones are noted. Liver is within normal limits.  Pancreas: Unremarkable. No pancreatic ductal dilatation or surrounding inflammatory changes.  Spleen: Normal in size without focal abnormality.  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 Left kidney shows atrophy with multiple hyperdense and hypodense cysts. Right kidney shows similar findings although no atrophy is seen. No obstructive changes are noted. The bladder is partially distended.  Stomach/Bowel: No obstructive or inflammatory changes of the colon are seen. The appendix is within normal limits. Scattered fecal material is noted throughout the colon. Small bowel and stomach are within normal limits.  Vascular/Lymphatic: Prominent proximal abdominal aorta is noted related to prior dissection and tortuosity. The dissection is not well appreciated due to lack of IV contrast  although some intimal calcification is noted within the mid aorta stable from the prior exam. No distal dilatation of the aorta is seen. Iliac calcifications are noted. No adenopathy is seen.  Reproductive: Status post hysterectomy. No adnexal masses.  Other: No abdominal wall hernia or abnormality. No abdominopelvic ascites.  Musculoskeletal: No acute or significant osseous findings.  IMPRESSION: Cholelithiasis without complicating factors.  No abnormality is identified to correspond with the given clinical history of rectal bleeding.  Hyperdense and hypodense cystic lesions are identified within both kidneys. These changes are stable from prior ultrasound from 04/01/2024.  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 No acute abnormality is noted.  Electronically Signed   By: Oneil Devonshire M.D.   On: 05/25/2024 19:17     Past Medical History:  Diagnosis Date   Anxiety    Arthritis    Cataract    forming- very small   Colon polyps    Depression    Diabetes mellitus without complication (HCC)    off meds for diabetes- diet controlled   GERD (gastroesophageal reflux disease)    H/O aortic dissection 2021   Hepatitis C    Hyperlipidemia    Hypertension    Neuropathy    feet    Past Surgical History:  Procedure Laterality Date   COLONOSCOPY  ?2001,2011?   in [place] (1st colon 20 polyps removed-per pt)   IR THORACENTESIS ASP PLEURAL SPACE W/IMG GUIDE  12/09/2020   PARTIAL HYSTERECTOMY  2001   REPAIR OF ACUTE ASCENDING THORACIC AORTIC DISSECTION N/A 12/02/2020   Procedure: REPAIR OF ACUTE ASCENDING THORACIC AORTIC DISSECTION VIA CIRC ARREST USING HEMASHIELD PLATINUM 28 MM VASCULAR GRAFT.;  Surgeon: Shyrl Linnie KIDD, MD;  Location: [facility] OR;  Service: Vascular;  Laterality: N/A;  AXILLARY CANNULATION AND CIRC ARREST   REPAIR OF ACUTE ASCENDING THORACIC AORTIC DISSECTION VIA CIRC ARREST USING HEMASHIELD PLATINUM 28 MM VASCULAR GRAFT.  (N/A)  12/02/2020   TEE WITHOUT CARDIOVERSION  12/02/2020   Procedure: TRANSESOPHAGEAL ECHOCARDIOGRAM (TEE);  Surgeon: Shyrl Linnie KIDD, MD;  Location: [facility] OR;  Service: Vascular;;   TRANSESOPHAGEAL ECHOCARDIOGRAM (CATH LAB) N/A 04/17/2024   Procedure: TRANSESOPHAGEAL ECHOCARDIOGRAM;  Surgeon: Shlomo Wilbert SAUNDERS, MD;  Location: [facility] INVASIVE CV LAB;  Service: Cardiovascular;  Laterality: N/A;    Family History  Problem Relation Age  of Onset   Diabetes Mother    Colon cancer Neg Hx    Colon polyps Neg Hx    Esophageal cancer Neg Hx    Rectal cancer Neg Hx    Stomach cancer Neg Hx     Prior to Admission medications   Medication Sig Start Date End Date Taking? Authorizing Provider  acetaminophen  (TYLENOL ) 500 MG tablet Take 500-1,000 mg by mouth every 8 (eight) hours as needed (for pain).   Yes [provider]  amLODipine  (NORVASC ) 10 MG tablet Take 1 tablet (10 mg total) by mouth daily. 04/03/24  Yes Krishnan, Gokul, MD  aspirin  EC 81 MG tablet Take 1 tablet (81 mg total) by mouth daily. Swallow whole. 05/10/24  Yes Meng, Hao, PA  atorvastatin  (LIPITOR) 40 MG tablet Take 40 mg by mouth at bedtime.   Yes [provider]  gabapentin  (NEURONTIN ) 300 MG capsule Take 300 mg by mouth daily.   Yes [provider]  hydrALAZINE  (APRESOLINE ) 100 MG tablet Take 100 mg by mouth 3 (three) times daily.   Yes [provider]  metoprolol  tartrate (LOPRESSOR ) 50 MG tablet Take 50 mg by mouth 2 (two) times daily.   Yes [provider]  furosemide  (LASIX ) 40 MG tablet Take 1 tablet (40 mg total) by mouth daily. 05/02/24 06/01/24  Perri DELENA Meliton Mickey., MD  glipiZIDE  (GLUCOTROL  XL) 5 MG 24 hr tablet Take 1 tablet (5 mg total) by mouth daily with breakfast. Patient not taking: Reported on 06/20/2020 01/27/18 06/20/20  Fleming, Zelda W, NP    Current Facility-Administered Medications  Medication Dose Route Frequency Provider Last Rate Last Admin   0.9 %  sodium chloride   infusion   Intravenous Continuous Melvin, Alexander B, MD 75 mL/hr at 11/21/24 9344 Restarted at 11/21/24 9344   acetaminophen  (TYLENOL ) tablet 650 mg  650 mg Oral Q6H PRN Seena Marsa NOVAK, MD       Or   acetaminophen  (TYLENOL ) suppository 650 mg  650 mg Rectal Q6H PRN Seena Marsa NOVAK, MD       LORazepam  (ATIVAN ) tablet 0.5 mg  0.5 mg Oral PRN Baron, Lauren E, PA       polyethylene glycol (MIRALAX  / GLYCOLAX ) packet 17 g  17 g Oral Daily PRN Seena Marsa NOVAK, MD       sodium chloride  flush (NS) 0.9 % injection 3 mL  3 mL Intravenous Q12H Seena Marsa NOVAK, MD   3 mL at 11/20/24 2215   Current Outpatient Medications  Medication Sig Dispense Refill   acetaminophen  (TYLENOL ) 500 MG tablet Take 500-1,000 mg by mouth every 8 (eight) hours as needed (for pain).     amLODipine  (NORVASC ) 10 MG tablet Take 1 tablet (10 mg total) by mouth daily. 30 tablet 1   aspirin  EC 81 MG tablet Take 1 tablet (81 mg total) by mouth daily. Swallow whole. 90 tablet 3   atorvastatin  (LIPITOR) 40 MG tablet Take 40 mg by mouth at bedtime.     gabapentin  (NEURONTIN ) 300 MG capsule Take 300 mg by mouth daily.     hydrALAZINE  (APRESOLINE ) 100 MG tablet Take 100 mg by mouth 3 (three) times daily.     metoprolol  tartrate (LOPRESSOR ) 50 MG tablet Take 50 mg by mouth 2 (two) times daily.     furosemide  (LASIX ) 40 MG tablet Take 1 tablet (40 mg total) by mouth daily. 30 tablet 1    Allergies as of 11/20/2024   (No Known Allergies)    Social History  Socioeconomic History   Marital status: Single    Spouse name: Not on file   Number of children: 1   Years of education: Not on file   Highest education level: Not on file  Occupational History   Occupation: unemployed  Tobacco Use   Smoking status: Every Day    Current packs/day: 0.25    Types: Cigarettes   Smokeless tobacco: Never   Tobacco comments:    admits to cuttting back 4 cigarettes/day  Vaping Use   Vaping status: Never Used  Substance and  Sexual Activity   Alcohol use: Yes    Alcohol/week: 1.0 standard drink of alcohol    Types: 1 Standard drinks or equivalent per week    Comment: wine cooler   Drug use: Not Currently    Types: Marijuana   Sexual activity: Not on file  Other Topics Concern   Not on file  Social History Narrative   Lives alone.  One daughter.    Social Drivers of Corporate Investment Banker Strain: Low Risk  (10/16/2024)   Received from [facility]   Overall Financial Resource Strain (CARDIA)    Difficulty of Paying Living Expenses: Not hard at all  Food Insecurity: No Food Insecurity (10/16/2024)   Received from [facility]   Hunger Vital Sign    Within the past 12 months, you worried that your food would run out before you got the money to buy more.: Never true    Within the past 12 months, the food you bought just didn't last and you didn't have money to get more.: Never true  Transportation Needs: No Transportation Needs (10/16/2024)   Received [facility]    In the past 12 months, has lack of transportation kept you from medical appointments or from getting medications?: No    Lack of Transportation (Non-Medical): No  Physical Activity: Not on file  Stress: Not on file  Social Connections: Socially Isolated (05/01/2024)   Social Connection and Isolation Panel    Frequency of Communication with Friends and Family: Three times a week    Frequency of Social Gatherings with Friends and Family: Twice a week    Attends Religious Services: Never    Database Administrator or Organizations: No    Attends Banker Meetings: Never    Marital Status: Never married  Intimate Partner Violence: Not At Risk (05/01/2024)   Humiliation, Afraid, Rape, and Kick questionnaire    Fear of Current or Ex-Partner: No    Emotionally Abused: No    Physically Abused: No    Sexually Abused: No     Code Status   Code  Status: Full Code  Review of Systems: All systems reviewed and negative except where noted in HPI.  Physical Exam: Vital signs in last 24 hours: Temp:  [97.5 F (36.4 C)-98.9 F (37.2 C)] 98.9 F (37.2 C) (12/10 0050) Pulse Rate:  [62-79] 71 (12/10 0645) Resp:  [11-25] 19 (12/10 0645) BP: (104-156)/(56-121) 127/56 (12/10 0600) SpO2:  [100 %] 100 % (12/10 0645) Weight:  [61.2 kg] 61.2 kg (12/09 1728)    General:  Pleasant female in NAD Psych:  Cooperative. Normal mood and affect Eyes: Pupils equal Ears:  Normal auditory acuity Nose: No deformity, discharge or lesions Neck:  Supple, no masses felt Lungs:  Clear to auscultation.  Heart:  Regular rate, regular rhythm.  Abdomen:  Soft, nondistended, nontender, active bowel sounds,  no masses felt Rectal :  Large, round, quarter size, firm, nodular perianal mass Msk: Symmetrical without gross deformities.  Neurologic:  Alert, oriented, grossly normal neurologically Extremities : No edema Skin:  Intact without significant lesions.    Intake/Output from previous day: No intake/output data recorded. Intake/Output this shift:  No intake/output data recorded.   Vina Dasen, NP-C   11/21/2024, 8:39 AM

## 2024-11-21 NOTE — ED Notes (Signed)
 CBC to be drawn at 0250

## 2024-11-21 NOTE — Progress Notes (Signed)
 PROGRESS NOTE    Mary Stephens  FMW:[id number] DOB: January 13, 1956 DOA: 11/20/2024 PCP: Cloria Annabella CROME, DO   Brief Narrative:  Mary Stephens is a 68 y.o. 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  Patient notes multiple large-volume bloody bowel movements over the past few days, hemoglobin 6.5 at intake, blood transfusion ordered.  Hospitalist called for admission, GI called in consult.   Assessment & Plan:   Principal Problem:   Acute GI bleeding Active Problems:   History of hepatitis C   DM (diabetes mellitus), type 2 (HCC)   Essential hypertension   HLD (hyperlipidemia)   Acute renal failure superimposed on stage 4 chronic kidney disease (HCC)   History of aortic dissection   GERD (gastroesophageal reflux disease)  Acute GI bleed Acute blood loss anemia on chronic anemia of chronic disease(CKD 4) - GI following, appreciate insight recommendations - Hemoglobin improving after transfusion - CTA study pending to evaluate for possible intervention site - Plan for endoscopy 12/11, n.p.o. after midnight   CKD 4 - Without AKI - Baseline labs labile from 3.5-4.3 -Creatinine stable around 4.5  - Initiate IV fluids given poor p.o. intake over the past 48 hours  Hypertension- Hold home medications in the interim, follow clinically Hyperlipidemia- Continue home atorvastatin  Diabetes diet controlled- No longer on medication for this History of hepatitis C> Completed treatment with Harv[facility]   History of aortic valve replacement History of ascending thoracic aneurysm with dissection status post repair, stable  DVT prophylaxis: SCDs Start: 11/20/24 1747 Code Status:   Code Status: Full Code Family Communication: None present  Status is: Inpatient  Dispo:  The patient is from: Home              Anticipated d/c is to: Home              Anticipated d/c date is: 48 to 72 hours              Patient currently not medically stable for discharge  Consultants:  GI  Procedures:  None, tentative plan for endoscopy as above  Antimicrobials:  None indicated  Subjective: No acute issues or events overnight, small hard bowel movement noted overnight questionably bloody but otherwise denies nausea vomiting diarrhea constipation headache fevers chills or chest pain  Objective: Vitals:   11/21/24 0500 11/21/24 0501 11/21/24 0600 11/21/24 0645  BP: 124/68  (!) 127/56   Pulse: 62 62 72 71  Resp: (!) 25 20 (!) 21 19  Temp:      TempSrc:      SpO2: 100% 100% 100% 100%  Weight:      Height:       No intake or output data in the 24 hours ending 11/21/24 0733 Filed Weights   11/20/24 1728  Weight: 61.2 kg    Examination:  General:  Pleasantly resting in bed, No acute distress. HEENT:  Normocephalic atraumatic.  Sclerae nonicteric, noninjected.  Extraocular movements intact bilaterally. Neck:  Without mass or deformity.  Trachea is midline. Lungs:  Clear to auscultate bilaterally without rhonchi, wheeze, or rales. Heart:  Regular rate and rhythm.  Without murmurs, rubs, or gallops. Abdomen:  Soft, nontender, nondistended.  Without guarding or rebound. Extremities: Without cyanosis, clubbing, edema, or obvious deformity. Skin:  Warm and dry, no erythema.  Data Reviewed: I have personally reviewed following labs  and imaging studies  CBC: Recent Labs  Lab 11/20/24 1417 11/21/24 0316  WBC 4.8 4.6  HGB 6.5* 7.9*  HCT 21.2* 24.3*  MCV 101.9* 93.8  PLT 157 131*   Basic Metabolic Panel: Recent Labs  Lab 11/20/24 1417 11/21/24 0316  NA 142 140  K 3.9 3.4*  CL 113* 111  CO2 21* 22  GLUCOSE 107* 84  BUN 36* 35*  CREATININE 4.44* 4.52*  CALCIUM  9.0 8.3*   GFR: Estimated Creatinine Clearance: 11.2 mL/min (A) (by C-G formula based on  SCr of 4.52 mg/dL (H)). Liver Function Tests: Recent Labs  Lab 11/20/24 1417 11/21/24 0316  AST 15 12*  ALT 7 7  ALKPHOS 61 52  BILITOT 0.7 0.8  PROT 5.9* 5.5*  ALBUMIN  3.3* 3.0*   No results for input(s): LIPASE, AMYLASE in the last 168 hours. No results for input(s): AMMONIA in the last 168 hours. Coagulation Profile: No results for input(s): INR, PROTIME in the last 168 hours. Cardiac Enzymes: No results for input(s): CKTOTAL, CKMB, CKMBINDEX, TROPONINI in the last 168 hours. BNP (last 3 results) No results for input(s): PROBNP in the last 8760 hours. HbA1C: No results for input(s): HGBA1C in the last 72 hours. CBG: No results for input(s): GLUCAP in the last 168 hours. Lipid Profile: No results for input(s): CHOL, HDL, LDLCALC, TRIG, CHOLHDL, LDLDIRECT in the last 72 hours. Thyroid  Function Tests: No results for input(s): TSH, T4TOTAL, FREET4, T3FREE, THYROIDAB in the last 72 hours. Anemia Panel: No results for input(s): VITAMINB12, FOLATE, FERRITIN, TIBC, IRON, RETICCTPCT in the last 72 hours. Sepsis Labs: No results for input(s): PROCALCITON, LATICACIDVEN in the last 168 hours.  No results found for this or any previous visit (from the past 240 hours).       Radiology Studies: No results found.      Scheduled Meds:  sodium chloride  flush  3 mL Intravenous Q12H   Continuous Infusions:  sodium chloride  75 mL/hr at 11/21/24 0655     LOS: 1 day    Time spent:    Elsie JAYSON Montclair, DO [facility]  If 7PM-7AM, please contact night-coverage [email or web address]  11/21/2024, 7:33 AM

## 2024-11-22 ENCOUNTER — Inpatient Hospital Stay (HOSPITAL_COMMUNITY): Payer: Medicare (Managed Care)

## 2024-11-22 ENCOUNTER — Encounter (HOSPITAL_COMMUNITY)
Admission: EM | Disposition: A | Discharge status: Home / Self Care | Payer: Self-pay | Source: Ambulatory Visit | Attending: Internal Medicine

## 2024-11-22 ENCOUNTER — Encounter (HOSPITAL_COMMUNITY): Payer: Self-pay | Admitting: Internal Medicine

## 2024-11-22 DIAGNOSIS — C218 Malignant neoplasm of overlapping sites of rectum, anus and anal canal: Secondary | ICD-10-CM | POA: Diagnosis not present

## 2024-11-22 DIAGNOSIS — F418 Other specified anxiety disorders: Secondary | ICD-10-CM | POA: Diagnosis not present

## 2024-11-22 DIAGNOSIS — I10 Essential (primary) hypertension: Secondary | ICD-10-CM

## 2024-11-22 DIAGNOSIS — F17210 Nicotine dependence, cigarettes, uncomplicated: Secondary | ICD-10-CM | POA: Diagnosis not present

## 2024-11-22 DIAGNOSIS — K6289 Other specified diseases of anus and rectum: Secondary | ICD-10-CM | POA: Diagnosis not present

## 2024-11-22 DIAGNOSIS — D490 Neoplasm of unspecified behavior of digestive system: Secondary | ICD-10-CM | POA: Diagnosis not present

## 2024-11-22 DIAGNOSIS — K625 Hemorrhage of anus and rectum: Secondary | ICD-10-CM | POA: Diagnosis not present

## 2024-11-22 DIAGNOSIS — E1142 Type 2 diabetes mellitus with diabetic polyneuropathy: Secondary | ICD-10-CM

## 2024-11-22 HISTORY — PX: FLEXIBLE SIGMOIDOSCOPY: SHX5431

## 2024-11-22 LAB — BASIC METABOLIC PANEL WITH GFR
Anion gap: 7 (ref 5–15)
BUN: 29 mg/dL — ABNORMAL HIGH (ref 8–23)
CO2: 24 mmol/L (ref 22–32)
Calcium: 8.2 mg/dL — ABNORMAL LOW (ref 8.9–10.3)
Chloride: 110 mmol/L (ref 98–111)
Creatinine, Ser: 3.46 mg/dL — ABNORMAL HIGH (ref 0.44–1.00)
GFR, Estimated: 14 mL/min — ABNORMAL LOW (ref >60)
Glucose, Bld: 80 mg/dL (ref 70–99)
Potassium: 3.2 mmol/L — ABNORMAL LOW (ref 3.5–5.1)
Sodium: 141 mmol/L (ref 135–145)

## 2024-11-22 LAB — CBC
HCT: 24.6 % — ABNORMAL LOW (ref 36.0–46.0)
Hemoglobin: 8.1 g/dL — ABNORMAL LOW (ref 12.0–15.0)
MCH: 30.9 pg (ref 26.0–34.0)
MCHC: 32.9 g/dL (ref 30.0–36.0)
MCV: 93.9 fL (ref 80.0–100.0)
Platelets: 141 K/uL — ABNORMAL LOW (ref 150–400)
RBC: 2.62 MIL/uL — ABNORMAL LOW (ref 3.87–5.11)
RDW: 17.9 % — ABNORMAL HIGH (ref 11.5–15.5)
WBC: 4.3 K/uL (ref 4.0–10.5)
nRBC: 0 % (ref 0.0–0.2)

## 2024-11-22 LAB — GLUCOSE, CAPILLARY: Glucose-Capillary: 93 mg/dL (ref 70–99)

## 2024-11-22 SURGERY — SIGMOIDOSCOPY, FLEXIBLE
Anesthesia: Monitor Anesthesia Care

## 2024-11-22 MED ORDER — PROPOFOL 10 MG/ML IV BOLUS
INTRAVENOUS | Status: DC | PRN
  Administered 2024-11-22: 20 mg via INTRAVENOUS
  Administered 2024-11-22: 30 mg via INTRAVENOUS

## 2024-11-22 MED ORDER — POTASSIUM CHLORIDE 10 MEQ/100ML IV SOLN
INTRAVENOUS | Status: AC
  Administered 2024-11-22: 10 meq via INTRAVENOUS
  Filled 2024-11-22: qty 100

## 2024-11-22 MED ORDER — SODIUM CHLORIDE 0.9 % IV SOLN: INTRAVENOUS | Status: DC

## 2024-11-22 MED ORDER — PROPOFOL 500 MG/50ML IV EMUL
INTRAVENOUS | Status: DC | PRN
  Administered 2024-11-22: 125 ug/kg/min via INTRAVENOUS

## 2024-11-22 MED ORDER — ATORVASTATIN CALCIUM 40 MG PO TABS
40.0000 mg | ORAL_TABLET | Freq: Every day | ORAL | Status: DC
  Administered 2024-11-22 – 2024-11-30 (×9): 40 mg via ORAL
  Filled 2024-11-22 (×9): qty 1

## 2024-11-22 MED ORDER — METOPROLOL TARTRATE 25 MG PO TABS
25.0000 mg | ORAL_TABLET | Freq: Two times a day (BID) | ORAL | Status: DC
  Administered 2024-11-22 – 2024-11-28 (×12): 25 mg via ORAL
  Filled 2024-11-22 (×12): qty 1

## 2024-11-22 MED ORDER — AMLODIPINE BESYLATE 10 MG PO TABS
10.0000 mg | ORAL_TABLET | Freq: Every day | ORAL | Status: DC
  Administered 2024-11-22 – 2024-12-01 (×10): 10 mg via ORAL
  Filled 2024-11-22 (×9): qty 1

## 2024-11-22 MED ORDER — POTASSIUM CHLORIDE 10 MEQ/100ML IV SOLN
10.0000 meq | INTRAVENOUS | Status: AC
  Administered 2024-11-22: 10 meq via INTRAVENOUS
  Filled 2024-11-22: qty 100

## 2024-11-22 NOTE — Anesthesia Preprocedure Evaluation (Addendum)
 Anesthesia Evaluation  Patient identified by MRN, date of birth, ID band Patient awake    Reviewed: Allergy & Precautions, NPO status , Patient's Chart, lab work & pertinent test results  Airway Mallampati: II  TM Distance: >3 FB Neck ROM: Full    Dental  (+) Poor Dentition, Missing   Pulmonary Current Smoker and Patient abstained from smoking.   Pulmonary exam normal        Cardiovascular hypertension, Pt. on medications and Pt. on home beta blockers  Rhythm:Regular Rate:Normal  IMPRESSIONS   1. Left ventricular ejection fraction, by estimation, is 60 to 65%. The left ventricle has normal function. The left ventricle has no regional wall motion abnormalities. There is mild concentric left ventricular hypertrophy.  2. Right ventricular systolic function is normal. The right ventricular size is normal.  3. Left atrial size was severely dilated.  4. MR directed more posterior into LA Vena contracta width 0.7 . Moderate to severe mitral valve regurgitation.  5. Aortic valve regurgitation is moderate.  6. Dissection flap in descending aorta seen (known).    Neuro/Psych   Anxiety Depression    negative neurological ROS     GI/Hepatic ,GERD  ,,(+) Hepatitis -Rectal bleeding, perianal lesion   Endo/Other  diabetes    Renal/GU Renal disease     Musculoskeletal  (+) Arthritis , Osteoarthritis,    Abdominal Normal abdominal exam  (+)   Peds  Hematology  (+) Blood dyscrasia, anemia Lab Results      Component                Value               Date                      WBC                      4.3                 11/22/2024                HGB                      8.1 (L)             11/22/2024                HCT                      24.6 (L)            11/22/2024                MCV                      93.9                11/22/2024                PLT                      141 (L)             11/22/2024               Anesthesia Other Findings   Reproductive/Obstetrics  Anesthesia Physical Anesthesia Plan  ASA: 3  Anesthesia Plan: MAC   Post-op Pain Management:    Induction: Intravenous  PONV Risk Score and Plan: 1 and Propofol  infusion and Treatment may vary due to age or medical condition  Airway Management Planned: Simple Face Mask and Nasal Cannula  Additional Equipment: None  Intra-op Plan:   Post-operative Plan:   Informed Consent: I have reviewed the patients History and Physical, chart, labs and discussed the procedure including the risks, benefits and alternatives for the proposed anesthesia with the patient or authorized representative who has indicated his/her understanding and acceptance.     Dental advisory given  Plan Discussed with: CRNA  Anesthesia Plan Comments:          Anesthesia Quick Evaluation

## 2024-11-22 NOTE — Plan of Care (Signed)
   Problem: Health Behavior/Discharge Planning: Goal: Ability to manage health-related needs will improve Outcome: Progressing

## 2024-11-22 NOTE — TOC Initial Note (Signed)
 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   Patient lives alone, has daughter for support when needed, does report her daughter will transport her home at d/c. Patient drives, has PCP, manages own medications.   Current medical workup.  CM will continue to follow.  Expected Discharge Plan:  (TBD) Barriers to Discharge: Continued Medical Work up   Patient Goals and CMS Choice            Expected Discharge Plan and Services       Living arrangements for the past 2 months: Apartment                                      Prior Living Arrangements/Services Living arrangements for the past 2 months: Apartment Lives with:: Self Patient language and need for interpreter reviewed:: Yes Do you feel safe going back to the place where you live?: Yes      Need for Family Participation in Patient Care: Yes (Comment) Care giver support system in place?: Yes (comment) Current home services: DME (W/C) Criminal Activity/Legal Involvement Pertinent to Current Situation/Hospitalization: No - Comment as needed  Activities of Daily Living   ADL Screening (condition at time of admission) Independently performs ADLs?: Yes (appropriate for developmental age) Is the patient deaf or have difficulty hearing?: No Does the patient have difficulty seeing, even when wearing glasses/contacts?: Yes Does the patient have difficulty concentrating, remembering, or making decisions?: No  Permission  Sought/Granted                  Emotional Assessment Appearance:: Developmentally appropriate Attitude/Demeanor/Rapport: Engaged Affect (typically observed): Calm Orientation: : Oriented to Self, Oriented to Place, Oriented to  Time, Oriented to Situation Alcohol / Substance Use: Not Applicable Psych Involvement: No (comment)  Admission diagnosis:  Rectal bleeding [K62.5] Acute GI bleeding [K92.2] Symptomatic anemia [D64.9] Patient Active Problem List   Diagnosis Date Noted   Rectal bleeding 11/21/2024   Symptomatic anemia 11/21/2024   Rectal mass 11/21/2024   GERD (gastroesophageal reflux disease) 11/20/2024   Acute GI bleeding 11/20/2024   Hemorrhoids 10/22/2024   History of aortic dissection 05/01/2024   Chest pain 04/19/2024   SOB (shortness of breath) 04/18/2024   Pericarditis 04/15/2024   Anemia due to chronic kidney disease 04/15/2024   Mitral valve regurgitation 04/15/2024   Hypokalemia 05/07/2023   CKD (chronic kidney disease) stage 4, GFR 15-29 ml/min (HCC) 05/06/2023   HLD (hyperlipidemia) 05/06/2023   Acute renal failure superimposed on stage 4 chronic kidney disease (HCC) 05/06/2023   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 [place] [address] Phone: [phone] Fax: [phone]     Social Drivers of Health (SDOH) Social History: SDOH Screenings   Food Insecurity: No Food Insecurity (10/16/2024)   Received from  [facility]  Housing: Low Risk  (10/16/2024)   Received from [facility]  Transportation Needs: No Transportation Needs (10/16/2024)   Received from [facility]  Utilities: Not At Risk (10/16/2024)   Received from [facility]  Financial Resource Strain: Low Risk  (10/16/2024)   Received from [facility]  Social Connections: Socially Isolated (05/01/2024)  Tobacco Use: High Risk (11/22/2024)   SDOH Interventions:     Readmission Risk Interventions     No data to display

## 2024-11-22 NOTE — Progress Notes (Signed)
 PROGRESS NOTE        PATIENT DETAILS Name: Mary Stephens Age: 68 y.o. Sex: female Date of Birth: 09/12/1956 Admit Date: 11/20/2024 Admitting Physician Marsa KATHEE Scurry, MD ERE:Mzzi, Tiffany L, DO  Brief Summary: Patient is a 68 y.o.  female with history of CKD 4, HTN, HLD, type A Aortic dissection-s/p hemiarch repair 2021-presented with hematochezia with ABLA and subsequently admitted to the hospitalist service.  Significant events: 12/9>> admit to [facility]  Significant studies: 12/10>> CTA GI bleed: No evidence of active GI bleed.  Significant microbiology data: None  Procedures: 12/11>> flex sig: Mass extending from the dentate line and prolapsing out of the anus.  Consults: None  Subjective: Multiple episodes of hematochezia overnight.  Objective: Vitals: Blood pressure (!) 146/90, pulse 71, temperature 97.6 F (36.4 C), resp. rate 17, height 5' 7.99 (1.727 m), weight 61.1 kg, SpO2 97%.   Exam: Gen Exam:Alert awake-not in any distress HEENT:atraumatic, normocephalic Chest: B/L clear to auscultation anteriorly CVS:S1S2 regular Abdomen:soft non tender, non distended Extremities:no edema Neurology: Non focal Skin: no rash  Pertinent Labs/Radiology:    Latest Ref Rng & Units 11/22/2024    3:33 AM 11/21/2024    3:16 AM 11/20/2024    2:17 PM  CBC  WBC 4.0 - 10.5 K/uL 4.3  4.6  4.8   Hemoglobin 12.0 - 15.0 g/dL 8.1  7.9  6.5   Hematocrit 36.0 - 46.0 % 24.6  24.3  21.2   Platelets 150 - 400 K/uL 141  131  157     Lab Results  Component Value Date   NA 141 11/22/2024   K 3.2 (L) 11/22/2024   CL 110 11/22/2024   CO2 24 11/22/2024      Assessment/Plan: Lower GI bleeding acute blood loss anemia secondary to anorectal mass (biopsy pending) Sigmoidoscopy shows mass extending from dentate line in the rectum to out of the anus General surgery/radiation oncology consult MRI pelvis ordered Continues to have hematochezia  overnight-however Hb stable but did require 2 days of PRBC-last transfused on 12/10 Continue to follow CBC  CKD 4 At baseline Follow electrolytes  HTN BP stable-but creeping up Allow some amount of hypertension due to ongoing GI bleeding However we will go ahead and resume amlodipine , and half of home dosing of metoprolol  Continue to hold hydralazine  for now.   Follow/optimize  HLD Statin  Chronic HCV Completed treatment with [facility]  per prior notes  Type A Aortic dissection-s/p hemiarch repair 2021  DM-2 (A1c 4.5 on 5/20) Diet controlled  Code status:   Code Status: Full Code   DVT Prophylaxis: SCDs Start: 11/20/24 1747    Family Communication: None at bedside   Disposition Plan: Status is: Inpatient Remains inpatient appropriate because: Severity of illness   Planned Discharge Destination:Home   Diet: Diet Order             Diet regular Room service appropriate? Yes; Fluid consistency: Thin  Diet effective now                     Antimicrobial agents: Anti-infectives (From admission, onward)    None        MEDICATIONS: Scheduled Meds:  sodium chloride  flush  3 mL Intravenous Q12H   Continuous Infusions: PRN Meds:.acetaminophen  **OR** acetaminophen , LORazepam , polyethylene glycol   I have personally reviewed following labs and imaging studies  LABORATORY DATA: CBC: Recent Labs  Lab 11/20/24 1417 11/21/24 0316 11/22/24 0333  WBC 4.8 4.6 4.3  HGB 6.5* 7.9* 8.1*  HCT 21.2* 24.3* 24.6*  MCV 101.9* 93.8 93.9  PLT 157 131* 141*    Basic Metabolic Panel: Recent Labs  Lab 11/20/24 1417 11/21/24 0316 11/22/24 0333  NA 142 140 141  K 3.9 3.4* 3.2*  CL 113* 111 110  CO2 21* 22 24  GLUCOSE 107* 84 80  BUN 36* 35* 29*  CREATININE 4.44* 4.52* 3.46*  CALCIUM  9.0 8.3* 8.2*    GFR: Estimated Creatinine Clearance: 14.6 mL/min (A) (by C-G formula based on SCr of 3.46 mg/dL (H)).  Liver Function Tests: Recent Labs  Lab  11/20/24 1417 11/21/24 0316  AST 15 12*  ALT 7 7  ALKPHOS 61 52  BILITOT 0.7 0.8  PROT 5.9* 5.5*  ALBUMIN  3.3* 3.0*   No results for input(s): LIPASE, AMYLASE in the last 168 hours. No results for input(s): AMMONIA in the last 168 hours.  Coagulation Profile: No results for input(s): INR, PROTIME in the last 168 hours.  Cardiac Enzymes: No results for input(s): CKTOTAL, CKMB, CKMBINDEX, TROPONINI in the last 168 hours.  BNP (last 3 results) No results for input(s): PROBNP in the last 8760 hours.  Lipid Profile: No results for input(s): CHOL, HDL, LDLCALC, TRIG, CHOLHDL, LDLDIRECT in the last 72 hours.  Thyroid  Function Tests: No results for input(s): TSH, T4TOTAL, FREET4, T3FREE, THYROIDAB in the last 72 hours.  Anemia Panel: No results for input(s): VITAMINB12, FOLATE, FERRITIN, TIBC, IRON, RETICCTPCT in the last 72 hours.  Urine analysis:    Component Value Date/Time   COLORURINE YELLOW 05/25/2024 1705   APPEARANCEUR CLOUDY (A) 05/25/2024 1705   LABSPEC 1.010 05/25/2024 1705   PHURINE 5.0 05/25/2024 1705   GLUCOSEU NEGATIVE 05/25/2024 1705   HGBUR NEGATIVE 05/25/2024 1705   BILIRUBINUR NEGATIVE 05/25/2024 1705   KETONESUR NEGATIVE 05/25/2024 1705   PROTEINUR 30 (A) 05/25/2024 1705   NITRITE NEGATIVE 05/25/2024 1705   LEUKOCYTESUR TRACE (A) 05/25/2024 1705    Sepsis Labs: Lactic Acid, Venous    Component Value Date/Time   LATICACIDVEN 0.4 (L) 04/30/2024 2156    MICROBIOLOGY: No results found for this or any previous visit (from the past 240 hours).  RADIOLOGY STUDIES/RESULTS: CT ANGIO GI BLEED Result Date: 11/21/2024 CLINICAL DATA:  GI bleed. EXAM: CTA ABDOMEN AND PELVIS WITHOUT AND WITH CONTRAST TECHNIQUE: Multidetector CT imaging of the abdomen and pelvis was performed using the standard protocol during bolus administration of intravenous contrast. Multiplanar reconstructed images and MIPs were  obtained and reviewed to evaluate the vascular anatomy. RADIATION DOSE REDUCTION: This exam was performed according to the departmental dose-optimization program which includes automated exposure control, adjustment of the mA and/or kV according to patient size and/or use of iterative reconstruction technique. CONTRAST:  75mL OMNIPAQUE  IOHEXOL  350 MG/ML SOLN COMPARISON:  CT abdomen pelvis dated 12/02/2020. FINDINGS: VASCULAR Aorta: Old dissection flap in the abdominal aorta. There is mild atherosclerotic calcification of the aorta. The true and false lumens appear patent. Celiac: The celiac artery is patent. SMA: There is extension of the dissection flap into the SMA. The SMA is patent. Renals: The right renal arteries are patent. The left linear artery is narrowed and appears scarred with diminished flow. IMA: The IMA is patent. Inflow: Extension of the dissection flap into the left common iliac and external iliac arteries. The iliac arteries are patent bilaterally. Proximal Outflow: The visualized proximal outflow is patent. Veins: The  IVC is unremarkable. The SMV, splenic vein, and main portal vein are patent. No portal venous gas. Review of the MIP images confirms the above findings. NON-VASCULAR Lower chest: Small bilateral pleural effusions. Partially visualized pericardial effusion measuring 7 mm in thickness. No intra-abdominal free air.  Small free fluid in the pelvis. Hepatobiliary: The liver is unremarkable. The gallbladder is contracted. Small gallstones suspected in the gallbladder. Pancreas: Unremarkable. No pancreatic ductal dilatation or surrounding inflammatory changes. Spleen: Normal in size without focal abnormality. Adrenals/Urinary Tract: Bilateral adrenal thickening/hyperplasia or adenoma similar to prior CT. Left renal parenchyma atrophy. Several left renal cysts. A 12 mm high attenuating lesion from the posterior lower pole of the left kidney as well as a 2 cm high attenuating lesion from the  upper pole of the left kidney are not characterized on this CT but were present on the prior CT, likely proteinaceous cysts. No hydronephrosis on either side. The urinary bladder is grossly unremarkable. Stomach/Bowel: Moderate stool throughout the colon. No bowel obstruction or active inflammation. No evidence of active GI bleed. The appendix is normal. Lymphatic: No adenopathy. Reproductive: Hysterectomy. Other: None Musculoskeletal: No acute or significant osseous findings. IMPRESSION: 1. No evidence of active GI bleed. 2. Old dissection flap in the abdominal aorta with extension of the dissection flap into the SMA and left common iliac and external iliac arteries. The aorta and mesenteric vessels are patent. 3. Small bilateral pleural effusions. 4. Partially visualized pericardial effusion. 5. No bowel obstruction. Normal appendix. 6.  Aortic Atherosclerosis (ICD10-I70.0). Electronically Signed   By: Vanetta Chou M.D.   On: 11/21/2024 19:03     LOS: 2 days   Donalda Applebaum, MD  [facility]    To contact the attending provider between 7A-7P or the covering provider during after hours 7P-7A, please log into the web site [email or web address] and access using universal [place] password for that web site. If you do not have the password, please call the hospital operator.  11/22/2024, 10:27 AM

## 2024-11-22 NOTE — Plan of Care (Signed)

## 2024-11-22 NOTE — Anesthesia Postprocedure Evaluation (Signed)
 Anesthesia Post Note  Patient: Mary Stephens  Procedure(s) Performed: [place] SIDE     Patient location during evaluation: PACU Anesthesia Type: MAC Level of consciousness: awake and alert Pain management: pain level controlled Vital Signs Assessment: post-procedure vital signs reviewed and stable Respiratory status: spontaneous breathing, nonlabored ventilation, respiratory function stable and patient connected to nasal cannula oxygen  Cardiovascular status: stable and blood pressure returned to baseline Postop Assessment: no apparent nausea or vomiting Anesthetic complications: no   No notable events documented.  Last Vitals:  Vitals:   11/22/24 0900 11/22/24 0915  BP: 139/80 (!) 146/90  Pulse: 65 71  Resp: 16 17  Temp:  36.4 C  SpO2: 99% 97%    Last Pain:  Vitals:   11/22/24 0915  TempSrc:   PainSc: 0-No pain                 Cordella P Camdin Hegner

## 2024-11-22 NOTE — Progress Notes (Signed)
 Dr. Dewey is aware of this pt and we will plan to follow up with her pending pathology and if she has anal carcinoma she will need an outpt PET scan to determine treatment. We would plan to see this pt if radiation is indicated as an outpt.     Donald KYM Husband, PAC

## 2024-11-22 NOTE — Consult Note (Addendum)
 Consult Note  HERO MCCATHERN 1956/02/22  [id number].    Requesting MD:  Dr. 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 She had noted large-volume of blood bowel movements. Her hemoglobin was 6.5 when she presented requiring transfusion and IV iron.  CT Angio bleed from 12/10 showed no GI bleed. Patent aorta and mesenteric vessels noted. Small bilateral pleural effusions. Partially visualized pericardial effusion. No bowel obstruction. Normal appendix. Aortic atherosclerosis.   GI was consulted and on exam there was a firm perianal mass found. She underwent flexible sigmoidoscopy with bioipsies on 12/10. There was a mass found to be extending from the dentate line prolapsing out of the anus. Pathology results are pending and patient is scheduled to undergo MRI to further assess.  Today, upon entering patient room, patient is frustrated and agitated regarding the care she has received since being hospitalized. After redirecting conversation, she reports that she has had painless blood per the rectum intermittently for at least 2-3 months that are spontaneous episodes. She has a history of hemorrhoids and contributed the rectal bleeding to these. She reports that she has continued to have bowel movements that are normal for her. Her last BM was right before being hospitalized. She has tolerated PO intake and denies n/v. She denies abdominal pain or rectal pain. She only reports burning sensation similar to hemorrhoidal  pain.   Surgical history: Partial hysterectomy, IR thoracentesis asp pleural space, Transesophageal Echocardiogram, Repair of acute ascending thoracic aortic dissection via circ arrest using hemashield platinum vascular graft,  Anticoagulation/Antiplatelet medications: Denies Tobacco use:  3-4 cigarettes per day Alcohol use: Denies Illicit drug use: Marijuana use in the past but not currently. Allergies: NKDA --Denies family history of colon cancers   ROS: Per HPI.  Family History  Problem Relation Age of Onset   Diabetes Mother    Colon cancer Neg Hx    Colon polyps Neg Hx    Esophageal cancer Neg Hx    Rectal cancer Neg Hx    Stomach cancer Neg Hx     Past Medical History:  Diagnosis Date   Anxiety    Arthritis    Cataract    forming- very small   Colon polyps    Depression    Diabetes mellitus without complication (HCC)    off meds for diabetes- diet controlled   GERD (gastroesophageal reflux disease)    H/O aortic dissection 2021   Hepatitis C    Hyperlipidemia    Hypertension    Neuropathy    feet    Past Surgical History:  Procedure Laterality Date   COLONOSCOPY  ?2001,2011?   in [place] (1st colon 20 polyps removed-per pt)   IR THORACENTESIS ASP PLEURAL SPACE W/IMG GUIDE  12/09/2020   PARTIAL HYSTERECTOMY  2001   REPAIR OF ACUTE ASCENDING THORACIC AORTIC DISSECTION N/A 12/02/2020   Procedure: REPAIR OF ACUTE ASCENDING THORACIC AORTIC DISSECTION VIA CIRC ARREST USING HEMASHIELD PLATINUM 28 MM VASCULAR GRAFT.;  Surgeon: Shyrl Linnie KIDD, MD;  Location: [facility] OR;  Service: Vascular;  Laterality: N/A;  AXILLARY CANNULATION AND CIRC ARREST  REPAIR OF ACUTE ASCENDING THORACIC AORTIC DISSECTION VIA CIRC ARREST USING HEMASHIELD PLATINUM 28 MM VASCULAR GRAFT. (N/A)  12/02/2020   TEE WITHOUT CARDIOVERSION  12/02/2020   Procedure: TRANSESOPHAGEAL ECHOCARDIOGRAM (TEE);  Surgeon: Shyrl Linnie KIDD, MD;  Location: [facility] OR;  Service: Vascular;;   TRANSESOPHAGEAL  ECHOCARDIOGRAM (CATH LAB) N/A 04/17/2024   Procedure: TRANSESOPHAGEAL ECHOCARDIOGRAM;  Surgeon: Shlomo Wilbert SAUNDERS, MD;  Location: [facility] INVASIVE CV LAB;  Service: Cardiovascular;  Laterality: N/A;    Social History:  reports that she has been smoking cigarettes. She has never used smokeless tobacco. She reports current alcohol use of about 1.0 standard drink of alcohol per week. She reports that she does not currently use drugs after having used the following drugs: Marijuana.  Allergies: Allergies[1]  Medications Prior to Admission  Medication Sig Dispense Refill   acetaminophen  (TYLENOL ) 500 MG tablet Take 500-1,000 mg by mouth every 8 (eight) hours as needed (for pain).     amLODipine  (NORVASC ) 10 MG tablet Take 1 tablet (10 mg total) by mouth daily. 30 tablet 1   aspirin  EC 81 MG tablet Take 1 tablet (81 mg total) by mouth daily. Swallow whole. 90 tablet 3   atorvastatin  (LIPITOR) 40 MG tablet Take 40 mg by mouth at bedtime.     gabapentin  (NEURONTIN ) 300 MG capsule Take 300 mg by mouth daily.     hydrALAZINE  (APRESOLINE ) 100 MG tablet Take 100 mg by mouth 3 (three) times daily.     metoprolol  tartrate (LOPRESSOR ) 50 MG tablet Take 50 mg by mouth 2 (two) times daily.     furosemide  (LASIX ) 40 MG tablet Take 1 tablet (40 mg total) by mouth daily. 30 tablet 1    Blood pressure (!) 146/90, pulse 71, temperature 97.6 F (36.4 C), resp. rate 17, height 5' 7.99 (1.727 m), weight 61.1 kg, SpO2 97%. Physical Exam:  General: Thin female who is laying in bed in NAD. HEENT: Had is normocephalic, atraumatic. Sclera anicteric.  Heart: Normal HR during encounter.  Lungs: Respiratory effort nonlabored on room air. Abd: Soft, NT, ND. No rebound tenderness or guarding.  MS: Able to move all extremities.  Skin: Warm and dry.  Psych: A&Ox3 with an appropriate affect. Rectal exam: Patient refused.    Results for orders placed or performed during the hospital encounter of 11/20/24 (from the past 48 hours)   Type and screen [facility]     Status: None   Collection Time: 11/20/24  2:14 PM  Result Value Ref Range   ABO/RH(D) O POS    Antibody Screen NEG    Sample Expiration 11/23/2024,2359    Unit Number [id number]    Blood Component Type RED CELLS,LR    Unit division 00    Status of Unit ISSUED,FINAL    Transfusion Status OK TO TRANSFUSE    Crossmatch Result      Compatible Performed at [facility], [address] [place], [place] [address]    Unit Number [id number]    Blood Component Type RED CELLS,LR    Unit division 00    Status of Unit ISSUED,FINAL    Transfusion Status OK TO TRANSFUSE    Crossmatch Result Compatible   Comprehensive metabolic panel     Status: Abnormal   Collection Time: 11/20/24  2:17 PM  Result Value Ref Range   Sodium 142 135 - 145 mmol/L   Potassium 3.9 3.5 - 5.1 mmol/L   Chloride 113 (H) 98 - 111 mmol/L   CO2 21 (  L) 22 - 32 mmol/L   Glucose, Bld 107 (H) 70 - 99 mg/dL    Comment: Glucose reference range applies only to samples taken after fasting for at least 8 hours.   BUN 36 (H) 8 - 23 mg/dL   Creatinine, Ser 5.55 (H) 0.44 - 1.00 mg/dL   Calcium  9.0 8.9 - 10.3 mg/dL   Total Protein 5.9 (L) 6.5 - 8.1 g/dL   Albumin  3.3 (L) 3.5 - 5.0 g/dL   AST 15 15 - 41 U/L   ALT 7 0 - 44 U/L   Alkaline Phosphatase 61 38 - 126 U/L   Total Bilirubin 0.7 0.0 - 1.2 mg/dL   GFR, Estimated 10 (L) >60 mL/min    Comment: (NOTE) Calculated using the CKD-EPI Creatinine Equation (2021)    Anion gap 8 5 - 15    Comment: Performed at [facility], [address], [place], [place] [address]  CBC     Status: Abnormal   Collection Time: 11/20/24  2:17 PM  Result Value Ref Range   WBC 4.8 4.0 - 10.5 K/uL   RBC 2.08 (L) 3.87 - 5.11 MIL/uL   Hemoglobin 6.5 (LL) 12.0 - 15.0 g/dL    Comment: REPEATED TO VERIFY This critical result has been called to VICCI HOUSTON, RN by Gabriela Sorto on 11/20/2024 16:14:24, and has been read back.     HCT 21.2 (L) 36.0 - 46.0 %   MCV 101.9 (H) 80.0 - 100.0 fL   MCH 31.3 26.0 - 34.0 pg   MCHC 30.7 30.0 - 36.0 g/dL   RDW 84.1 (H) 88.4 - 84.4 %   Platelets 157 150 - 400 K/uL   nRBC 0.0 0.0 - 0.2 %    Comment: Performed at [facility], [address] [place], [place] [address]  POC occult blood, ED     Status: Abnormal   Collection Time: 11/20/24  2:38 PM  Result Value Ref Range   Fecal Occult Bld POSITIVE (A) NEGATIVE  Prepare RBC (crossmatch)     Status: None   Collection Time: 11/20/24  4:28 PM  Result Value Ref Range   Order Confirmation      ORDER PROCESSED BY BLOOD BANK Performed at [facility], [address], [place], [place] [address]   HIV Antibody (routine testing w rflx)     Status: None   Collection Time: 11/21/24  3:16 AM  Result Value Ref Range   HIV Screen 4th Generation wRfx Non Reactive Non Reactive    Comment: Performed at [facility], [address], [place], [place] [address]  Comprehensive metabolic panel     Status: Abnormal   Collection Time: 11/21/24  3:16 AM  Result Value Ref Range   Sodium 140 135 - 145 mmol/L   Potassium 3.4 (L) 3.5 - 5.1 mmol/L   Chloride 111 98 - 111 mmol/L   CO2 22 22 - 32 mmol/L   Glucose, Bld 84 70 - 99 mg/dL    Comment: Glucose reference range applies only to samples taken after fasting for at least 8 hours.   BUN 35 (H) 8 - 23 mg/dL   Creatinine, Ser 5.47 (H) 0.44 - 1.00 mg/dL   Calcium  8.3 (L) 8.9 - 10.3 mg/dL   Total Protein 5.5 (L) 6.5 - 8.1 g/dL   Albumin  3.0 (L) 3.5 - 5.0 g/dL   AST 12 (L) 15 - 41 U/L   ALT 7 0 - 44 U/L  Alkaline Phosphatase 52 38 - 126 U/L   Total Bilirubin 0.8 0.0 - 1.2 mg/dL   GFR, Estimated 10 (L) >60 mL/min    Comment: (NOTE) Calculated using the CKD-EPI Creatinine Equation (2021)    Anion gap 7 5 - 15    Comment: Performed at [facility], [address], [place], [place] [address]  CBC     Status: Abnormal   Collection Time: 11/21/24  3:16 AM  Result Value Ref  Range   WBC 4.6 4.0 - 10.5 K/uL   RBC 2.59 (L) 3.87 - 5.11 MIL/uL   Hemoglobin 7.9 (L) 12.0 - 15.0 g/dL   HCT 75.6 (L) 63.9 - 53.9 %   MCV 93.8 80.0 - 100.0 fL    Comment: REPEATED TO VERIFY POST TRANSFUSION SPECIMEN    MCH 30.5 26.0 - 34.0 pg   MCHC 32.5 30.0 - 36.0 g/dL   RDW 81.5 (H) 88.4 - 84.4 %   Platelets 131 (L) 150 - 400 K/uL    Comment: REPEATED TO VERIFY   nRBC 0.0 0.0 - 0.2 %    Comment: Performed at [facility], [address], [place], [place] [address]  CBC     Status: Abnormal   Collection Time: 11/22/24  3:33 AM  Result Value Ref Range   WBC 4.3 4.0 - 10.5 K/uL   RBC 2.62 (L) 3.87 - 5.11 MIL/uL   Hemoglobin 8.1 (L) 12.0 - 15.0 g/dL   HCT 75.3 (L) 63.9 - 53.9 %   MCV 93.9 80.0 - 100.0 fL   MCH 30.9 26.0 - 34.0 pg   MCHC 32.9 30.0 - 36.0 g/dL   RDW 82.0 (H) 88.4 - 84.4 %   Platelets 141 (L) 150 - 400 K/uL   nRBC 0.0 0.0 - 0.2 %    Comment: Performed at [facility], [address], [place], [place] [address]  Basic metabolic panel with GFR     Status: Abnormal   Collection Time: 11/22/24  3:33 AM  Result Value Ref Range   Sodium 141 135 - 145 mmol/L   Potassium 3.2 (L) 3.5 - 5.1 mmol/L   Chloride 110 98 - 111 mmol/L   CO2 24 22 - 32 mmol/L   Glucose, Bld 80 70 - 99 mg/dL    Comment: Glucose reference range applies only to samples taken after fasting for at least 8 hours.   BUN 29 (H) 8 - 23 mg/dL   Creatinine, Ser 6.53 (H) 0.44 - 1.00 mg/dL   Calcium  8.2 (L) 8.9 - 10.3 mg/dL   GFR, Estimated 14 (L) >60 mL/min    Comment: (NOTE) Calculated using the CKD-EPI Creatinine Equation (2021)    Anion gap 7 5 - 15    Comment: Performed at [facility], [address] [place], [place] [address]  Glucose, capillary     Status: None   Collection Time: 11/22/24  8:52 AM  Result Value Ref Range   Glucose-Capillary 93 70 - 99 mg/dL    Comment: Glucose reference range applies only to samples taken after fasting for at least 8 hours.   CT ANGIO  GI BLEED Result Date: 11/21/2024 CLINICAL DATA:  GI bleed. EXAM: CTA ABDOMEN AND PELVIS WITHOUT AND WITH CONTRAST TECHNIQUE: Multidetector CT imaging of the abdomen and pelvis was performed using the standard protocol during bolus administration of intravenous contrast. Multiplanar reconstructed images and MIPs were obtained and reviewed to evaluate the vascular anatomy. RADIATION DOSE REDUCTION: This exam  was performed according to the departmental dose-optimization program which includes automated exposure control, adjustment of the mA and/or kV according to patient size and/or use of iterative reconstruction technique. CONTRAST:  75mL OMNIPAQUE  IOHEXOL  350 MG/ML SOLN COMPARISON:  CT abdomen pelvis dated 12/02/2020. FINDINGS: VASCULAR Aorta: Old dissection flap in the abdominal aorta. There is mild atherosclerotic calcification of the aorta. The true and false lumens appear patent. Celiac: The celiac artery is patent. SMA: There is extension of the dissection flap into the SMA. The SMA is patent. Renals: The right renal arteries are patent. The left linear artery is narrowed and appears scarred with diminished flow. IMA: The IMA is patent. Inflow: Extension of the dissection flap into the left common iliac and external iliac arteries. The iliac arteries are patent bilaterally. Proximal Outflow: The visualized proximal outflow is patent. Veins: The IVC is unremarkable. The SMV, splenic vein, and main portal vein are patent. No portal venous gas. Review of the MIP images confirms the above findings. NON-VASCULAR Lower chest: Small bilateral pleural effusions. Partially visualized pericardial effusion measuring 7 mm in thickness. No intra-abdominal free air.  Small free fluid in the pelvis. Hepatobiliary: The liver is unremarkable. The gallbladder is contracted. Small gallstones suspected in the gallbladder. Pancreas: Unremarkable. No pancreatic ductal dilatation or surrounding inflammatory changes. Spleen: Normal  in size without focal abnormality. Adrenals/Urinary Tract: Bilateral adrenal thickening/hyperplasia or adenoma similar to prior CT. Left renal parenchyma atrophy. Several left renal cysts. A 12 mm high attenuating lesion from the posterior lower pole of the left kidney as well as a 2 cm high attenuating lesion from the upper pole of the left kidney are not characterized on this CT but were present on the prior CT, likely proteinaceous cysts. No hydronephrosis on either side. The urinary bladder is grossly unremarkable. Stomach/Bowel: Moderate stool throughout the colon. No bowel obstruction or active inflammation. No evidence of active GI bleed. The appendix is normal. Lymphatic: No adenopathy. Reproductive: Hysterectomy. Other: None Musculoskeletal: No acute or significant osseous findings. IMPRESSION: 1. No evidence of active GI bleed. 2. Old dissection flap in the abdominal aorta with extension of the dissection flap into the SMA and left common iliac and external iliac arteries. The aorta and mesenteric vessels are patent. 3. Small bilateral pleural effusions. 4. Partially visualized pericardial effusion. 5. No bowel obstruction. Normal appendix. 6.  Aortic Atherosclerosis (ICD10-I70.0). Electronically Signed   By: Vanetta Chou M.D.   On: 11/21/2024 19:03      Assessment/Plan Hematochezia Mass at dentate line prolapsing into anus -GI was consulted and on exam there was a firm perianal mass found. She underwent flexible sigmoidoscopy with bioipsies were completed on 12/10.  Pathology is pending.  -Afebrile. -WBC 4.3; HGB 8.1 (Had transfusion on 12/9). Continue to monitor. -Has had continuous painless, rectal bleeding. Patient refused rectal exam today. Explained that it is difficult to offer surgical treatment options without being able to assess on physical exam. Patient expressed understanding and declines.  -MRI pending.  -CEA labs pending -Discussed with patient her labs, imaging,  procedures, and symptoms regarding hematochezia and rectal mass seen through sigmoidoscopy. We also discussed ongoing work up of pending pathology, MRI, and CEA. MRI will help determine if she is a surgical candidate should this be cancerous. I have also discussed patient's case with one of our colorectal surgeons (Dr. Debby). Should mass be found to be cancerous in early stages, patient will need outpatient follow up with colorectal specialist to consider surgical intervention. If found to be  more advanced staging, patient would likely not benefit from surgical intervention and she would only be candidate for treatment deemed appropriate by oncology. Patient expressed understanding and all of her questions were answered.  -Will continue to follow.   FEN: Regular: IVF per primary team VTE: SCDs; Held due to above. ID: None  Per TRH CKD4 HTN HLD Chronic HCV Type A Aortic dissection - s/p hemiarch repair 2021 DMT2 GERD Hemorrhoids  I reviewed nursing notes, consulting provider notes, procedure notes, hospitalist notes, last 24 h vitals and pain scores, last 48 h intake and output, last 24 h labs and trends, and last 24 h imaging results.  This care required high  level of medical decision making.   Marjorie Carlyon Favre, [facility] 11/22/2024, 10:48 AM Please see Amion for pager number during day hours 7:00am-4:30pm       [1] No Known Allergies

## 2024-11-22 NOTE — Transfer of Care (Signed)
 Immediate Anesthesia Transfer of Care Note  Patient: Mary Stephens  Procedure(s) Performed: [facility]  Patient Location: PACU  Anesthesia Type:MAC  Level of Consciousness: drowsy and patient cooperative  Airway & Oxygen  Therapy: Patient Spontanous Breathing and Patient connected to nasal cannula oxygen   Post-op Assessment: Report given to RN, Post -op Vital signs reviewed and stable, and Patient moving all extremities X 4  Post vital signs: Reviewed and stable  Last Vitals:  Vitals Value Taken Time  BP 117/75   Temp    Pulse 69 11/22/24 08:49  Resp 14   SpO2 100 % 11/22/24 08:49  Vitals shown include unfiled device data.  Last Pain:  Vitals:   11/22/24 0806  TempSrc: Temporal  PainSc:          Complications: No notable events documented.

## 2024-11-22 NOTE — Interval H&P Note (Signed)
 History and Physical Interval Note: Patient is NPO today for unprepped flex sig - do not think she can tolerate prep due to discomfort. Discussed risks / benefits she understands and wishes to proceed. Rule out rectal mass. She reports she had further bleeding overnight. Hgb stable  11/22/2024 8:18 AM  Mary Stephens  has presented today for surgery, with the diagnosis of rectal bleeding, perianal lesion.  The various methods of treatment have been discussed with the patient and family. After consideration of risks, benefits and other options for treatment, the patient has consented to  Procedures: SIGMOIDOSCOPY, FLEXIBLE (N/A) as a surgical intervention.  The patient's history has been reviewed, patient examined, no change in status, stable for surgery.  I have reviewed the patient's chart and labs.  Questions were answered to the patient's satisfaction.     Mary Stephens

## 2024-11-22 NOTE — Op Note (Signed)
 [facility] Patient Name: Mary Stephens Procedure Date : 11/21/2024 MRN: [id number] Attending MD: Elspeth SQUIBB. Leigh , MD, [id number] Date of Birth: [birth year] CSN: [id number] Age: 68 Admit Type: Outpatient Procedure:                Flexible Sigmoidoscopy Indications:              Hematochezia - anemia - mass on DRE. No prep for                            this exam - do not think she could tolerate a prep                            or enema due to friability and discomfort Providers:                Elspeth SQUIBB. Leigh, MD, Darleene Bare, RN, Curtistine Bishop, Technician Referring MD:              Medicines:                Monitored Anesthesia Care Complications:            No immediate complications. Estimated blood loss:                            Minimal. Estimated Blood Loss:     Estimated blood loss was minimal. Procedure:                Pre-Anesthesia Assessment:                           - Prior to the procedure, a History and Physical                            was performed, and patient medications and                            allergies were reviewed. The patient's tolerance of                            previous anesthesia was also reviewed. The risks                            and benefits of the procedure and the sedation                            options and risks were discussed with the patient.                            All questions were answered, and informed consent                            was obtained. Prior Anticoagulants: The patient has  taken no anticoagulant or antiplatelet agents. ASA                            Grade Assessment: III - A patient with severe                            systemic disease. After reviewing the risks and                            benefits, the patient was deemed in satisfactory                            condition to undergo the procedure.                            After obtaining informed consent, the scope was                            passed under direct vision. The [id number] ([id number])                            Olympus endoscope was introduced through the anus                            and advanced to the the sigmoid colon. The flexible                            sigmoidoscopy was accomplished without difficulty.                            The patient tolerated the procedure well. The                            quality of the bowel preparation was poor. Scope In: Scope Out: Findings:      The digital rectal exam revealed a firm anal mass. It was friable with       contact bleeding with DRE.      A frond-like/villous mass was found at the anus and extended into the       distal rectum / dentate line. Extremely friable. Biopsies were taken       with a cold forceps for histology.      Stool was found in the distal rectum, in the recto-sigmoid colon and in       the sigmoid colon which prohibited full evaluation. Impression:               - Preparation of the colon was poor.                           - Mass extending from the dentate line and                            prolapsing out of the anus.                           -  Stool in the distal rectum, in the recto-sigmoid                            colon and in the sigmoid colon. Recommendation:           - Return patient to hospital ward for ongoing care.                           - Advance diet as tolerated.                           - Continue present medications.                           - Await pathology results.                           - MRI pelvis for staging (this was not seen on CT                            angio recently)                           - Colorectal surgery consult                           - Radiation oncology consult                           - CEA level Procedure Code(s):        --- Professional ---                           [phone], Sigmoidoscopy, flexible; with  biopsy, single                            or multiple Diagnosis Code(s):        --- Professional ---                           K62.89, Other specified diseases of anus and rectum                           D49.0, Neoplasm of unspecified behavior of                            digestive system                           K92.1, Melena (includes Hematochezia) CPT copyright 2022 [employer]. All rights reserved. The codes documented in this report are preliminary and upon coder review may  be revised to meet current compliance requirements. Elspeth P. Amiya Escamilla, MD 11/22/2024 8:52:12 AM This report has been signed electronically. Number of Addenda: 0

## 2024-11-23 ENCOUNTER — Ambulatory Visit: Payer: Self-pay | Admitting: Gastroenterology

## 2024-11-23 ENCOUNTER — Other Ambulatory Visit (HOSPITAL_COMMUNITY): Payer: Self-pay

## 2024-11-23 ENCOUNTER — Inpatient Hospital Stay (HOSPITAL_COMMUNITY): Payer: Medicare (Managed Care)

## 2024-11-23 ENCOUNTER — Other Ambulatory Visit: Payer: Self-pay

## 2024-11-23 ENCOUNTER — Ambulatory Visit
Admit: 2024-11-23 | Discharge: 2024-11-23 | Disposition: A | Payer: Medicare (Managed Care) | Attending: Radiation Oncology | Admitting: Radiation Oncology

## 2024-11-23 DIAGNOSIS — C218 Malignant neoplasm of overlapping sites of rectum, anus and anal canal: Secondary | ICD-10-CM | POA: Diagnosis not present

## 2024-11-23 DIAGNOSIS — C20 Malignant neoplasm of rectum: Principal | ICD-10-CM | POA: Insufficient documentation

## 2024-11-23 DIAGNOSIS — K6289 Other specified diseases of anus and rectum: Secondary | ICD-10-CM | POA: Diagnosis not present

## 2024-11-23 DIAGNOSIS — K625 Hemorrhage of anus and rectum: Secondary | ICD-10-CM | POA: Diagnosis not present

## 2024-11-23 DIAGNOSIS — D62 Acute posthemorrhagic anemia: Secondary | ICD-10-CM | POA: Diagnosis not present

## 2024-11-23 LAB — VITAMIN B12: Vitamin B-12: 469 pg/mL (ref 180–914)

## 2024-11-23 LAB — PREPARE RBC (CROSSMATCH)

## 2024-11-23 LAB — CBC
HCT: 23.8 % — ABNORMAL LOW (ref 36.0–46.0)
HCT: 21.7 % — ABNORMAL LOW (ref 36.0–46.0)
Hemoglobin: 7.7 g/dL — ABNORMAL LOW (ref 12.0–15.0)
Hemoglobin: 7.1 g/dL — ABNORMAL LOW (ref 12.0–15.0)
MCH: 30.1 pg (ref 26.0–34.0)
MCH: 30.9 pg (ref 26.0–34.0)
MCHC: 32.4 g/dL (ref 30.0–36.0)
MCHC: 32.7 g/dL (ref 30.0–36.0)
MCV: 93 fL (ref 80.0–100.0)
MCV: 94.3 fL (ref 80.0–100.0)
Platelets: 150 K/uL (ref 150–400)
Platelets: 128 K/uL — ABNORMAL LOW (ref 150–400)
RBC: 2.56 MIL/uL — ABNORMAL LOW (ref 3.87–5.11)
RBC: 2.3 MIL/uL — ABNORMAL LOW (ref 3.87–5.11)
RDW: 17.9 % — ABNORMAL HIGH (ref 11.5–15.5)
RDW: 18 % — ABNORMAL HIGH (ref 11.5–15.5)
WBC: 6.8 K/uL (ref 4.0–10.5)
WBC: 5.4 K/uL (ref 4.0–10.5)
nRBC: 0 % (ref 0.0–0.2)
nRBC: 0 % (ref 0.0–0.2)

## 2024-11-23 LAB — IRON AND TIBC
Iron: 37 ug/dL (ref 28–170)
Saturation Ratios: 12 % (ref 10.4–31.8)
TIBC: 298 ug/dL (ref 250–450)
UIBC: 261 ug/dL

## 2024-11-23 LAB — FOLATE: Folate: >20 ng/mL (ref >5.9)

## 2024-11-23 LAB — FERRITIN: Ferritin: 16 ng/mL (ref 11–307)

## 2024-11-23 MED ORDER — ACETAMINOPHEN 325 MG PO TABS
650.0000 mg | ORAL_TABLET | Freq: Once | ORAL | Status: AC
  Administered 2024-11-23: 650 mg via ORAL
  Filled 2024-11-23: qty 2

## 2024-11-23 MED ORDER — DIPHENHYDRAMINE HCL 25 MG PO CAPS
25.0000 mg | ORAL_CAPSULE | Freq: Once | ORAL | Status: AC
  Administered 2024-11-23: 25 mg via ORAL
  Filled 2024-11-23: qty 1

## 2024-11-23 MED ORDER — CHLORHEXIDINE GLUCONATE CLOTH 2 % EX PADS
6.0000 | MEDICATED_PAD | Freq: Every day | CUTANEOUS | Status: DC
  Administered 2024-11-23 – 2024-11-30 (×8): 6 via TOPICAL

## 2024-11-23 MED ORDER — SODIUM CHLORIDE 0.9% IV SOLUTION: Freq: Once | INTRAVENOUS | Status: AC

## 2024-11-23 MED ORDER — IRON SUCROSE 300 MG IVPB - SIMPLE MED
300.0000 mg | Freq: Once | Status: AC
  Administered 2024-11-23: 22:00:00 300 mg via INTRAVENOUS
  Filled 2024-11-23: qty 300

## 2024-11-23 MED ORDER — SODIUM CHLORIDE 0.9% FLUSH
10.0000 mL | Freq: Two times a day (BID) | INTRAVENOUS | Status: DC
  Administered 2024-11-24 – 2024-12-01 (×15): 10 mL

## 2024-11-23 MED ORDER — SODIUM CHLORIDE 0.9% FLUSH: 10.0000 mL | INTRAVENOUS | Status: DC | PRN

## 2024-11-23 NOTE — Progress Notes (Addendum)
 [facility] Progress Note  1 Day Post-Op  Subjective: CC:  Denies rectal pain. Reports passage of large blood clot per rectum (the size of her fist) last night. Denies abdominal pain.  States her daughter works in healthcare and she also has a 70 year old grandson, both are involved in her life.   Objective: Vital signs in last 24 hours: Temp:  [97.6 F (36.4 C)-98.2 F (36.8 C)] 98.2 F (36.8 C) (12/12 0828) Pulse Rate:  [60-70] 70 (12/12 0246) Resp:  [15-18] 18 (12/12 0828) BP: (105-167)/(77-99) 148/77 (12/12 0828) SpO2:  [95 %-99 %] 95 % (12/12 0828)    Intake/Output from previous day: 12/11 0701 - 12/12 0700 In: 100 [I.V.:100] Out: -  Intake/Output this shift: No intake/output data recorded.  PE: Gen:  Alert, NAD, pleasant Card:  Regular rate and rhythm Pulm:  Normal effort, clear to auscultation bilaterally Abd: Soft, non-tender, non-distended GU: protruding anal mass without active bleeding. RN present for my exam. Skin: warm and dry, no rashes  Psych: A&Ox3   Lab Results:  Recent Labs    11/23/24 0255 11/23/24 1153  WBC 6.8 5.4  HGB 7.7* 7.1*  HCT 23.8* 21.7*  PLT 150 128*   BMET Recent Labs    11/21/24 0316 11/22/24 0333  NA 140 141  K 3.4* 3.2*  CL 111 110  CO2 22 24  GLUCOSE 84 80  BUN 35* 29*  CREATININE 4.52* 3.46*  CALCIUM  8.3* 8.2*   PT/INR No results for input(s): LABPROT, INR in the last 72 hours. CMP     Component Value Date/Time   NA 141 11/22/2024 0333   NA 143 05/10/2024 1524   K 3.2 (L) 11/22/2024 0333   CL 110 11/22/2024 0333   CO2 24 11/22/2024 0333   GLUCOSE 80 11/22/2024 0333   BUN 29 (H) 11/22/2024 0333   BUN 48 (H) 05/10/2024 1524   CREATININE 3.46 (H) 11/22/2024 0333   CREATININE 1.21 (H) 10/20/2016 1037   CALCIUM  8.2 (L) 11/22/2024 0333   PROT 5.5 (L) 11/21/2024 0316   PROT 7.6 01/27/2018 1518   ALBUMIN  3.0 (L) 11/21/2024 0316   ALBUMIN  4.4 01/27/2018 1518   AST 12 (L) 11/21/2024 0316   ALT  7 11/21/2024 0316   ALKPHOS 52 11/21/2024 0316   BILITOT 0.8 11/21/2024 0316   BILITOT 0.3 01/27/2018 1518   GFRNONAA 14 (L) 11/22/2024 0333   GFRNONAA 48 (L) 10/20/2016 1037   GFRAA 41 (L) 01/27/2018 1518   GFRAA 55 (L) 10/20/2016 1037   Lipase     Component Value Date/Time   LIPASE 34 05/25/2024 1705       Studies/Results: CT ANGIO GI BLEED Result Date: 11/21/2024 CLINICAL DATA:  GI bleed. EXAM: CTA ABDOMEN AND PELVIS WITHOUT AND WITH CONTRAST TECHNIQUE: Multidetector CT imaging of the abdomen and pelvis was performed using the standard protocol during bolus administration of intravenous contrast. Multiplanar reconstructed images and MIPs were obtained and reviewed to evaluate the vascular anatomy. RADIATION DOSE REDUCTION: This exam was performed according to the departmental dose-optimization program which includes automated exposure control, adjustment of the mA and/or kV according to patient size and/or use of iterative reconstruction technique. CONTRAST:  75mL OMNIPAQUE  IOHEXOL  350 MG/ML SOLN COMPARISON:  CT abdomen pelvis dated 12/02/2020. FINDINGS: VASCULAR Aorta: Old dissection flap in the abdominal aorta. There is mild atherosclerotic calcification of the aorta. The true and false lumens appear patent. Celiac: The celiac artery is patent. SMA: There is extension of the dissection flap into  the SMA. The SMA is patent. Renals: The right renal arteries are patent. The left linear artery is narrowed and appears scarred with diminished flow. IMA: The IMA is patent. Inflow: Extension of the dissection flap into the left common iliac and external iliac arteries. The iliac arteries are patent bilaterally. Proximal Outflow: The visualized proximal outflow is patent. Veins: The IVC is unremarkable. The SMV, splenic vein, and main portal vein are patent. No portal venous gas. Review of the MIP images confirms the above findings. NON-VASCULAR Lower chest: Small bilateral pleural effusions.  Partially visualized pericardial effusion measuring 7 mm in thickness. No intra-abdominal free air.  Small free fluid in the pelvis. Hepatobiliary: The liver is unremarkable. The gallbladder is contracted. Small gallstones suspected in the gallbladder. Pancreas: Unremarkable. No pancreatic ductal dilatation or surrounding inflammatory changes. Spleen: Normal in size without focal abnormality. Adrenals/Urinary Tract: Bilateral adrenal thickening/hyperplasia or adenoma similar to prior CT. Left renal parenchyma atrophy. Several left renal cysts. A 12 mm high attenuating lesion from the posterior lower pole of the left kidney as well as a 2 cm high attenuating lesion from the upper pole of the left kidney are not characterized on this CT but were present on the prior CT, likely proteinaceous cysts. No hydronephrosis on either side. The urinary bladder is grossly unremarkable. Stomach/Bowel: Moderate stool throughout the colon. No bowel obstruction or active inflammation. No evidence of active GI bleed. The appendix is normal. Lymphatic: No adenopathy. Reproductive: Hysterectomy. Other: None Musculoskeletal: No acute or significant osseous findings. IMPRESSION: 1. No evidence of active GI bleed. 2. Old dissection flap in the abdominal aorta with extension of the dissection flap into the SMA and left common iliac and external iliac arteries. The aorta and mesenteric vessels are patent. 3. Small bilateral pleural effusions. 4. Partially visualized pericardial effusion. 5. No bowel obstruction. Normal appendix. 6.  Aortic Atherosclerosis (ICD10-I70.0). Electronically Signed   By: Vanetta Chou M.D.   On: 11/21/2024 19:03    Anti-infectives: Anti-infectives (From admission, onward)    None        Assessment/Plan Hematochezia Mass at dentate line prolapsing into anus  -GI was consulted and on exam there was a firm perianal mass found. She underwent flexible sigmoidoscopy with bioipsies were completed on  12/10. Prelim results adenocarcinoma, await final path. -Afebrile. -WBC 6.8; HGB 7.7 (2 u pRBC on 12/9). Continue to monitor. -MRI pending final read -CEA labs pending - [facility] will follow and continue make surgical recommendations, oncology consult pending, radiation oncology aware and following. No emergent role for surgery at present  FEN: Regular: IVF per primary team VTE: SCDs; Held due to above. 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  This care required moderate level of medical decision making.   Mary Pringle, PA-C [facility] Please see Amion for pager number during day hours 7:00am-4:30pm

## 2024-11-23 NOTE — Plan of Care (Signed)

## 2024-11-23 NOTE — Progress Notes (Signed)
 Overnight cross coverage  Informed by RN that patient had an episode of profuse rectal bleeding tonight.  She has been very engineer, mining and walking down the hall.  She has removed her IV and currently has no IV access.  Patient is also refusing for her vital signs to be checked.  Patient has now finally come back into the room.  I spoke to her and she continues to refuse care.  She denies lightheadedness/dizziness, chest pain, shortness of breath.  She is expressing frustration that doctors have not been able to stop her rectal bleeding but also at the same time refusing any further workup.  She is threatening to call her family and leave AMA.  I have explained to the patient that this is a very urgent matter and if not addressed can result in death.  Patient is AAO x 4 and has decision-making capacity.  I had a very lengthy discussion with her and requested that she allow us  to check her vital signs, place IV line, and repeat labs.  Patient continues to refuse care/refusing placement of IV line and refusing labs.  However, at the same time, patient is repeatedly saying that doctors are not helping her and that she will sue everyone and the hospital.

## 2024-11-23 NOTE — Progress Notes (Signed)
 Radiation Oncology         [phone] ________________________________  Name: Mary Stephens        MRN: [id number]  Date of Service: 11/20/2024 DOB: October 31, 1956  RR:Mzzi, Tiffany L, DO  No ref. provider found     REFERRING PHYSICIAN: No ref. provider found   OUTPATIENT CONSULTATION - Conducted via telephone at patient request.   I spoke with the patient to conduct this consult visit via telephone. 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    HISTORY OF PRESENT ILLNESS: Mary Stephens is a 68 y.o. female seen at the request of Dr. Raenelle for bleeding rectal mass.   Patient presented to the ED via EMS on 11/20/2024 with a chief complaint of acute on chronic (approximately 4-5 months) rectal bleeding. Her hemoglobin was 6.5 on admission. CTA of the abdomen and pelvis on 11/21/2024 demonstrated no evidence of an active GI bleed, and no evidence of adenopathy or distant metastatic disease.  She was subsequently admitted for further workup.  She was seen by gastroenterology and was not able to fully tolerate full DRE due to discomfort.  Because of this, they recommended limited flexible sigmoidoscopy which was performed on 11/22/2024.  Findings showed a mass extending from the dentate line and prolapsing out of the anus.  Biopsy was obtained at that time.  Final report is pending pending at this time however preliminary results demonstrate invasive moderately differentiated adenocarcinoma.  MRI of the pelvis was needed earlier this morning.  Hemoglobin from this morning is 7.7.  We were kindly consulted to discuss radiation treatment.  Patient notes continued rectal bleeding and associated clotting. She notes constipation every now and then, but reports pretty regular bowel movements. She  denies any diarrhea, pain with bowel movements, or abdominal pain. She states her appetite comes and goes, but denies nausea, vomiting, or unintended weight loss. She denies any dizziness or shortness of breath.    PREVIOUS RADIATION THERAPY: No   PAST MEDICAL HISTORY:  Past Medical History:  Diagnosis Date   Anxiety    Arthritis    Cataract    forming- very small   Colon polyps    Depression    Diabetes mellitus without complication (HCC)    off meds for diabetes- diet controlled   GERD (gastroesophageal reflux disease)    H/O aortic dissection 2021   Hepatitis C    Hyperlipidemia    Hypertension    Neuropathy    feet       PAST SURGICAL HISTORY: Past Surgical History:  Procedure Laterality Date   COLONOSCOPY  ?2001,2011?   in [place] (1st colon 20 polyps removed-per pt)   IR THORACENTESIS ASP PLEURAL SPACE W/IMG GUIDE  12/09/2020   PARTIAL HYSTERECTOMY  2001   REPAIR OF ACUTE ASCENDING THORACIC AORTIC DISSECTION N/A 12/02/2020   Procedure: REPAIR OF ACUTE ASCENDING THORACIC AORTIC DISSECTION VIA CIRC ARREST USING HEMASHIELD PLATINUM 28 MM VASCULAR GRAFT.;  Surgeon: Shyrl Linnie KIDD, MD;  Location: [facility] OR;  Service: Vascular;  Laterality: N/A;  AXILLARY CANNULATION AND CIRC ARREST   REPAIR OF ACUTE ASCENDING THORACIC AORTIC DISSECTION VIA CIRC ARREST USING HEMASHIELD PLATINUM 28 MM VASCULAR GRAFT. (N/A)  12/02/2020   TEE WITHOUT CARDIOVERSION  12/02/2020   Procedure: TRANSESOPHAGEAL ECHOCARDIOGRAM (TEE);  Surgeon: Shyrl Linnie KIDD,  MD;  Location: [facility] OR;  Service: Vascular;;   TRANSESOPHAGEAL ECHOCARDIOGRAM (CATH LAB) N/A 04/17/2024   Procedure: TRANSESOPHAGEAL ECHOCARDIOGRAM;  Surgeon: Shlomo Wilbert SAUNDERS, MD;  Location: [facility] INVASIVE CV LAB;  Service: Cardiovascular;  Laterality: N/A;     FAMILY HISTORY:  Family History  Problem Relation Age of Onset   Diabetes Mother    Colon cancer Neg Hx    Colon polyps Neg Hx    Esophageal cancer Neg Hx    Rectal cancer  Neg Hx    Stomach cancer Neg Hx      SOCIAL HISTORY:  reports that she has been smoking cigarettes. She has never used smokeless tobacco. She reports current alcohol use of about 1.0 standard drink of alcohol per week. She reports that she does not currently use drugs after having used the following drugs: Marijuana.   ALLERGIES: Patient has no known allergies.   MEDICATIONS:  Current Facility-Administered Medications  Medication Dose Route Frequency Provider Last Rate Last Admin   acetaminophen  (TYLENOL ) tablet 650 mg  650 mg Oral Q6H PRN Seena Marsa NOVAK, MD       Or   acetaminophen  (TYLENOL ) suppository 650 mg  650 mg Rectal Q6H PRN Seena Marsa NOVAK, MD       amLODipine  (NORVASC ) tablet 10 mg  10 mg Oral Daily Ghimire, Shanker M, MD   10 mg at 11/23/24 9171   atorvastatin  (LIPITOR) tablet 40 mg  40 mg Oral QHS Raenelle Donalda HERO, MD   40 mg at 11/22/24 2252   LORazepam  (ATIVAN ) tablet 0.5 mg  0.5 mg Oral PRN Baron, Lauren E, PA       metoprolol  tartrate (LOPRESSOR ) tablet 25 mg  25 mg Oral BID Raenelle Donalda HERO, MD   25 mg at 11/23/24 9171   polyethylene glycol (MIRALAX  / GLYCOLAX ) packet 17 g  17 g Oral Daily PRN Seena Marsa NOVAK, MD       sodium chloride  flush (NS) 0.9 % injection 3 mL  3 mL Intravenous Q12H Seena Marsa NOVAK, MD   3 mL at 11/22/24 1003     REVIEW OF SYSTEMS: Notable for that above.      PHYSICAL EXAM:  Wt Readings from Last 3 Encounters:  11/21/24 134 lb 11.2 oz (61.1 kg)  05/31/24 135 lb (61.2 kg)  05/25/24 131 lb 13.4 oz (59.8 kg)   Temp Readings from Last 3 Encounters:  11/23/24 98.2 F (36.8 C) (Oral)  05/25/24 97.6 F (36.4 C)  05/01/24 98.2 F (36.8 C) (Oral)   BP Readings from Last 3 Encounters:  11/23/24 (!) 148/77  05/31/24 132/80  05/25/24 (!) 141/85   Pulse Readings from Last 3 Encounters:  11/23/24 70  05/31/24 68  05/25/24 65   Pain Assessment Pain Score: 0-No pain/10  Deferred, due to nature of telephone visit.    ECOG = 1  0 - Asymptomatic (Fully active, able to carry on all predisease activities without restriction)  1 - Symptomatic but completely ambulatory (Restricted in physically strenuous activity but ambulatory and able to carry out work of a light or sedentary nature. For example, light housework, office work)  2 - Symptomatic, <50% in bed during the day (Ambulatory and capable of all self care but unable to carry out any work activities. Up and about more than 50% of waking hours)  3 - Symptomatic, >50% in bed, but not bedbound (Capable of only limited self-care, confined to bed or chair 50% or more of waking hours)  4 - Bedbound (  Completely disabled. Cannot carry on any self-care. Totally confined to bed or chair)  5 - Death   Raylene MM, Creech RH, Tormey DC, et al. [phone]). Toxicity and response criteria of the [facility]. Am. DOROTHA Bridges. Oncol. 5 (6): 649-55    LABORATORY DATA:  Lab Results  Component Value Date   WBC 5.4 11/23/2024   HGB 7.1 (L) 11/23/2024   HCT 21.7 (L) 11/23/2024   MCV 94.3 11/23/2024   PLT 128 (L) 11/23/2024   Lab Results  Component Value Date   NA 141 11/22/2024   K 3.2 (L) 11/22/2024   CL 110 11/22/2024   CO2 24 11/22/2024   Lab Results  Component Value Date   ALT 7 11/21/2024   AST 12 (L) 11/21/2024   ALKPHOS 52 11/21/2024   BILITOT 0.8 11/21/2024      RADIOGRAPHY: CT ANGIO GI BLEED Result Date: 11/21/2024 CLINICAL DATA:  GI bleed. EXAM: CTA ABDOMEN AND PELVIS WITHOUT AND WITH CONTRAST TECHNIQUE: Multidetector CT imaging of the abdomen and pelvis was performed using the standard protocol during bolus administration of intravenous contrast. Multiplanar reconstructed images and MIPs were obtained and reviewed to evaluate the vascular anatomy. RADIATION DOSE REDUCTION: This exam was performed according to the departmental dose-optimization program which includes automated exposure control, adjustment of the mA and/or kV  according to patient size and/or use of iterative reconstruction technique. CONTRAST:  75mL OMNIPAQUE  IOHEXOL  350 MG/ML SOLN COMPARISON:  CT abdomen pelvis dated 12/02/2020. FINDINGS: VASCULAR Aorta: Old dissection flap in the abdominal aorta. There is mild atherosclerotic calcification of the aorta. The true and false lumens appear patent. Celiac: The celiac artery is patent. SMA: There is extension of the dissection flap into the SMA. The SMA is patent. Renals: The right renal arteries are patent. The left linear artery is narrowed and appears scarred with diminished flow. IMA: The IMA is patent. Inflow: Extension of the dissection flap into the left common iliac and external iliac arteries. The iliac arteries are patent bilaterally. Proximal Outflow: The visualized proximal outflow is patent. Veins: The IVC is unremarkable. The SMV, splenic vein, and main portal vein are patent. No portal venous gas. Review of the MIP images confirms the above findings. NON-VASCULAR Lower chest: Small bilateral pleural effusions. Partially visualized pericardial effusion measuring 7 mm in thickness. No intra-abdominal free air.  Small free fluid in the pelvis. Hepatobiliary: The liver is unremarkable. The gallbladder is contracted. Small gallstones suspected in the gallbladder. Pancreas: Unremarkable. No pancreatic ductal dilatation or surrounding inflammatory changes. Spleen: Normal in size without focal abnormality. Adrenals/Urinary Tract: Bilateral adrenal thickening/hyperplasia or adenoma similar to prior CT. Left renal parenchyma atrophy. Several left renal cysts. A 12 mm high attenuating lesion from the posterior lower pole of the left kidney as well as a 2 cm high attenuating lesion from the upper pole of the left kidney are not characterized on this CT but were present on the prior CT, likely proteinaceous cysts. No hydronephrosis on either side. The urinary bladder is grossly unremarkable. Stomach/Bowel: Moderate stool  throughout the colon. No bowel obstruction or active inflammation. No evidence of active GI bleed. The appendix is normal. Lymphatic: No adenopathy. Reproductive: Hysterectomy. Other: None Musculoskeletal: No acute or significant osseous findings. IMPRESSION: 1. No evidence of active GI bleed. 2. Old dissection flap in the abdominal aorta with extension of the dissection flap into the SMA and left common iliac and external iliac arteries. The aorta and mesenteric vessels are patent. 3. Small bilateral pleural  effusions. 4. Partially visualized pericardial effusion. 5. No bowel obstruction. Normal appendix. 6.  Aortic Atherosclerosis (ICD10-I70.0). Electronically Signed   By: Vanetta Chou M.D.   On: 11/21/2024 19:03       IMPRESSION/PLAN: 1. Rectal adenocarcinoma, pending full staging workup   We reviewed this patient's case.  She presents with acute on chronic rectal bleeding from biopsy confirmed rectal adenocarcinoma.  Results of pelvis MRI is pending at this time.  We have ordered a CT of the chest for staging workup.  Dr. Dewey is recommending 5-1/2 weeks of radiation given concurrently with chemotherapy medical oncology is in agreement.  Patient is currently admitted at this [facility].  We will transfer her to [facility] for the weekend in anticipation of having patient come in for treatment planning on Monday, 11/26/2024, and starting her treatment on 11/27/2024.  Today, I talked to the patient and family about the findings and work-up thus far.  We discussed the natural history of rectal adenocarcinoma and general treatment, highlighting the role of radiotherapy in the management.  We discussed the available radiation techniques, and focused on the details of logistics and delivery.  We reviewed the anticipated acute and late sequelae associated with radiation in this setting.  The patient was encouraged to ask questions that I answered to the best of my ability. Patient expressed good  understanding of the treatment and would like to proceed. We will review consent at time of simulation.    This encounter was conducted via telephone.  The patient has provided two factor identification and has given verbal consent for this type of encounter and has been advised to only accept a meeting of this type in a secure network environment.  The time spent during this encounter was 45 minutes including preparation, discussion, and coordination of the patient's care  The attendants for this meeting include Leeroy Due PA-C and patient. During the encounter Leeroy Due PA-C was located at [facility] Radiation Oncology Department.  Patient was located at [facility].     Leeroy Due, PA-C    **Disclaimer: This note was dictated with voice recognition software. Similar sounding words can inadvertently be transcribed and this note may contain transcription errors which may not have been corrected upon publication of note.**

## 2024-11-23 NOTE — Progress Notes (Signed)
 PROGRESS NOTE        PATIENT DETAILS Name: Mary Stephens Age: 68 y.o. Sex: female Date of Birth: Mar 26, 1956 Admit Date: 11/20/2024 Admitting Physician Marsa KATHEE Scurry, MD ERE:Mzzi, Tiffany L, DO  Brief Summary: Patient is a 68 y.o.  female with history of CKD 4, HTN, HLD, type A Aortic dissection-s/p hemiarch repair 2021-presented with hematochezia with ABLA and subsequently admitted to the hospitalist service.  Significant events: 12/9>> admit to [facility]  Significant studies: 12/10>> CTA GI bleed: No evidence of active GI bleed.  Significant microbiology data: None  Procedures: 12/11>> flex sig: Mass extending from the dentate line and prolapsing out of the anus (preliminary biopsy results-adenocarcinoma)  Consults: GI Radiation oncology Medical oncology General Surgery  Subjective: Claims had a rough night-multiple episodes of hematochezia with blood clots.  None this morning.  Objective: Vitals: Blood pressure (!) 148/77, pulse 70, temperature 98.2 F (36.8 C), temperature source Oral, resp. rate 18, height 5' 7.99 (1.727 m), weight 61.1 kg, SpO2 95%.   Exam: Gen Exam:Alert awake-not in any distress HEENT:atraumatic, normocephalic Chest: B/L clear to auscultation anteriorly CVS:S1S2 regular Abdomen:soft non tender, non distended Extremities:no edema Neurology: Non focal Skin: no rash  Pertinent Labs/Radiology:    Latest Ref Rng & Units 11/23/2024   11:53 AM 11/23/2024    2:55 AM 11/22/2024    3:33 AM  CBC  WBC 4.0 - 10.5 K/uL 5.4  6.8  4.3   Hemoglobin 12.0 - 15.0 g/dL 7.1  7.7  8.1   Hematocrit 36.0 - 46.0 % 21.7  23.8  24.6   Platelets 150 - 400 K/uL 128  150  141     Lab Results  Component Value Date   NA 141 11/22/2024   K 3.2 (L) 11/22/2024   CL 110 11/22/2024   CO2 24 11/22/2024      Assessment/Plan: Lower GI bleeding acute blood loss anemia secondary to anorectal mass (preliminary biopsy-suggestive of  adenocarcinoma ) Continues to have intermittent hematochezia/blood clots CBC stable with downtrending-down to 7.1 today-Will go ahead and transfuse 1 more unit of PRBC this afternoon Recheck CBC tomorrow morning Since biopsies just adenocarcinoma-mass is friable with easy bleeding-suspect she will continue to have recurrent GI bleeding throughout this hospitalization-with her decreasing hemoglobin-not a safe discharge.  Reached out to oncology team-she will be transferred over to [facility] ([place] over the weekend-she will have radiation simulation done on Monday and will start radiation treatment on Tuesday as an inpatient.  Once bleeding issues have stabilized-she can be then discharged for further treatment in the outpatient setting.  CKD 4 At baseline Follow electrolytes  HTN BP stable-but creeping up Allow some amount of hypertension due to ongoing GI bleeding Cautiously continue with amlodipine  and half of home dosing of metoprolol  Continue to hold hydralazine  Follow/optimize.    HLD Statin  Chronic HCV Completed treatment with Harv[facility]  per prior notes  Type A Aortic dissection-s/p hemiarch repair 2021  DM-2 (A1c 4.5 on 5/20) Diet controlled  Code status:   Code Status: Full Code   DVT Prophylaxis: SCDs Start: 11/20/24 1747    Family Communication: None at bedside-patient is to update her daughter herself.   Disposition Plan: Status is: Inpatient Remains inpatient appropriate because: Severity of illness   Planned Discharge Destination:Home   Diet: Diet Order  Diet regular Room service appropriate? Yes; Fluid consistency: Thin  Diet effective now                     Antimicrobial agents: Anti-infectives (From admission, onward)    None        MEDICATIONS: Scheduled Meds:  amLODipine   10 mg Oral Daily   atorvastatin   40 mg Oral QHS   metoprolol  tartrate  25 mg Oral BID   sodium chloride  flush  3 mL Intravenous Q12H   Continuous  Infusions: PRN Meds:.acetaminophen  **OR** acetaminophen , LORazepam , polyethylene glycol   I have personally reviewed following labs and imaging studies  LABORATORY DATA: CBC: Recent Labs  Lab 11/20/24 1417 11/21/24 0316 11/22/24 0333 11/23/24 0255 11/23/24 1153  WBC 4.8 4.6 4.3 6.8 5.4  HGB 6.5* 7.9* 8.1* 7.7* 7.1*  HCT 21.2* 24.3* 24.6* 23.8* 21.7*  MCV 101.9* 93.8 93.9 93.0 94.3  PLT 157 131* 141* 150 128*    Basic Metabolic Panel: Recent Labs  Lab 11/20/24 1417 11/21/24 0316 11/22/24 0333  NA 142 140 141  K 3.9 3.4* 3.2*  CL 113* 111 110  CO2 21* 22 24  GLUCOSE 107* 84 80  BUN 36* 35* 29*  CREATININE 4.44* 4.52* 3.46*  CALCIUM  9.0 8.3* 8.2*    GFR: Estimated Creatinine Clearance: 14.6 mL/min (A) (by C-G formula based on SCr of 3.46 mg/dL (H)).  Liver Function Tests: Recent Labs  Lab 11/20/24 1417 11/21/24 0316  AST 15 12*  ALT 7 7  ALKPHOS 61 52  BILITOT 0.7 0.8  PROT 5.9* 5.5*  ALBUMIN  3.3* 3.0*   No results for input(s): LIPASE, AMYLASE in the last 168 hours. No results for input(s): AMMONIA in the last 168 hours.  Coagulation Profile: No results for input(s): INR, PROTIME in the last 168 hours.  Cardiac Enzymes: No results for input(s): CKTOTAL, CKMB, CKMBINDEX, TROPONINI in the last 168 hours.  BNP (last 3 results) No results for input(s): PROBNP in the last 8760 hours.  Lipid Profile: No results for input(s): CHOL, HDL, LDLCALC, TRIG, CHOLHDL, LDLDIRECT in the last 72 hours.  Thyroid  Function Tests: No results for input(s): TSH, T4TOTAL, FREET4, T3FREE, THYROIDAB in the last 72 hours.  Anemia Panel: Recent Labs    11/23/24 1153  VITAMINB12 469  FOLATE >20.0  FERRITIN 16  TIBC 298  IRON 37    Urine analysis:    Component Value Date/Time   COLORURINE YELLOW 05/25/2024 1705   APPEARANCEUR CLOUDY (A) 05/25/2024 1705   LABSPEC 1.010 05/25/2024 1705   PHURINE 5.0 05/25/2024 1705    GLUCOSEU NEGATIVE 05/25/2024 1705   HGBUR NEGATIVE 05/25/2024 1705   BILIRUBINUR NEGATIVE 05/25/2024 1705   KETONESUR NEGATIVE 05/25/2024 1705   PROTEINUR 30 (A) 05/25/2024 1705   NITRITE NEGATIVE 05/25/2024 1705   LEUKOCYTESUR TRACE (A) 05/25/2024 1705    Sepsis Labs: Lactic Acid, Venous    Component Value Date/Time   LATICACIDVEN 0.4 (L) 04/30/2024 2156    MICROBIOLOGY: No results found for this or any previous visit (from the past 240 hours).  RADIOLOGY STUDIES/RESULTS: MR PELVIS WO CM RECTAL CA STAGING Result Date: 11/23/2024 CLINICAL DATA:  New diagnosis of anal cancer. EXAM: MRI PELVIS WITHOUT CONTRAST TECHNIQUE: Multiplanar multisequence MR imaging of the pelvis was performed. No intravenous contrast was administered. Ultrasound gel was administered per rectum to optimize tumor evaluation. COMPARISON:  CTA of 11/21/2024 FINDINGS: TUMOR LOCATION Tumor distance from Anal Verge/Skin surface: Not applicable. Appears to protrude from the anus, including on image  32/5. Tumor distance to Internal Anal sphincter: Not applicable. TUMOR DESCRIPTION Circumferential extent: Circumferential, including on image 31/5. Tumor Size and volume: 5.3 by 2.7 x 2.8 cm on images 18/3 and 31/5. Volume 20.8 cc T - CATEGORY Based on lesion size and presumed anal primary, T3. Levator Ani Involvement: Present, especially eccentric right and posteriorly on image 31/5. N - CATEGORY Right inguinal adenopathy at 1.5 cm on image 15/5. This node is hyperenhancing on image 185/9 of the prior CTA. A right external iliac 9 mm node on image 8/5 is also hyperenhancing on the prior CTA. N1b. Other: No significant free fluid. A left ovarian T2 hypointense 2.5 x 2.0 cm on image 13/5. No bowel obstruction. The patient halted the exam after the initial T2 weighted imaging was performed. IMPRESSION: Exam was halted at patient request after the initial T2 weighted images. Anal primary, staged as T3, N1b as detailed above. Involvement  of the levator ani, as detailed above. 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 Consider nonemergent outpatient follow-up pre and postcontrast MRI at 3 months versus ultrasound surveillance. Electronically Signed   By: Rockey Kilts M.D.   On: 11/23/2024 13:57   CT ANGIO GI BLEED Result Date: 11/21/2024 CLINICAL DATA:  GI bleed. EXAM: CTA ABDOMEN AND PELVIS WITHOUT AND WITH CONTRAST TECHNIQUE: Multidetector CT imaging of the abdomen and pelvis was performed using the standard protocol during bolus administration of intravenous contrast. Multiplanar reconstructed images and MIPs were obtained and reviewed to evaluate the vascular anatomy. RADIATION DOSE REDUCTION: This exam was performed according to the departmental dose-optimization program which includes automated exposure control, adjustment of the mA and/or kV according to patient size and/or use of iterative reconstruction technique. CONTRAST:  75mL OMNIPAQUE  IOHEXOL  350 MG/ML SOLN COMPARISON:  CT abdomen pelvis dated 12/02/2020. FINDINGS: VASCULAR Aorta: Old dissection flap in the abdominal aorta. There is mild atherosclerotic calcification of the aorta. The true and false lumens appear patent. Celiac: The celiac artery is patent. SMA: There is extension of the dissection flap into the SMA. The SMA is patent. Renals: The right renal arteries are patent. The left linear artery is narrowed and appears scarred with diminished flow. IMA: The IMA is patent. Inflow: Extension of the dissection flap into the left common iliac and external iliac arteries. The iliac arteries are patent bilaterally. Proximal Outflow: The visualized proximal outflow is patent. Veins: The IVC is unremarkable. The SMV, splenic vein, and main portal vein are patent. No portal venous gas. Review of the MIP images confirms the above findings. NON-VASCULAR Lower chest: Small bilateral pleural effusions. Partially visualized pericardial effusion measuring 7 mm in  thickness. No intra-abdominal free air.  Small free fluid in the pelvis. Hepatobiliary: The liver is unremarkable. The gallbladder is contracted. Small gallstones suspected in the gallbladder. Pancreas: Unremarkable. No pancreatic ductal dilatation or surrounding inflammatory changes. Spleen: Normal in size without focal abnormality. Adrenals/Urinary Tract: Bilateral adrenal thickening/hyperplasia or adenoma similar to prior CT. Left renal parenchyma atrophy. Several left renal cysts. A 12 mm high attenuating lesion from the posterior lower pole of the left kidney as well as a 2 cm high attenuating lesion from the upper pole of the left kidney are not characterized on this CT but were present on the prior CT, likely proteinaceous cysts. No hydronephrosis on either side. The urinary bladder is grossly unremarkable. Stomach/Bowel: Moderate stool throughout the colon. No bowel obstruction or active inflammation. No evidence of active GI bleed. The appendix is normal. Lymphatic: No adenopathy. Reproductive:  Hysterectomy. Other: None Musculoskeletal: No acute or significant osseous findings. IMPRESSION: 1. No evidence of active GI bleed. 2. Old dissection flap in the abdominal aorta with extension of the dissection flap into the SMA and left common iliac and external iliac arteries. The aorta and mesenteric vessels are patent. 3. Small bilateral pleural effusions. 4. Partially visualized pericardial effusion. 5. No bowel obstruction. Normal appendix. 6.  Aortic Atherosclerosis (ICD10-I70.0). Electronically Signed   By: Vanetta Chou M.D.   On: 11/21/2024 19:03     LOS: 3 days   Donalda Applebaum, MD  [facility]    To contact the attending provider between 7A-7P or the covering provider during after hours 7P-7A, please log into the web site [email or web address] and access using universal [place] password for that web site. If you do not have the password, please call the hospital operator.  11/23/2024,  2:20 PM

## 2024-11-23 NOTE — Progress Notes (Addendum)
 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  However TEE did not demonstrate severe MR.    Thrombocytopenia Platelets 131   History of aortic dissection status post repair 2021   Uncontrolled hypertension   Hepatitis C(treated)   CKD4   PLAN   --Patient to be transferred to [facility] for chemotherapy / radiation --Repeat CBC given bleeding overnight. Will also check iron studies, b12, folate -- MRI pelvis results are pending   Subjective   Had large-volume rectal bleeding last night.  Frustrated about the ongoing episodes of bleeding   Objective   GI Studies:  11/22/2024 flexible sigmoidoscopy --frond-like /villous mass at the anus extending into the distal rectum /dentate line.  Extremely friable.  Biopsies taken.  Stool in the distal rectum, in the rectosigmoid and in the sigmoid colon which prohibited full evaluation.     Recent Labs    11/21/24 0316 11/22/24 0333 11/23/24 0255  WBC 4.6 4.3 6.8  HGB 7.9* 8.1* 7.7*  HCT 24.3* 24.6* 23.8*  MCV 93.8 93.9 93.0  PLT 131* 141* 150   No results for input(s): FOLATE, VITAMINB12, FERRITIN, TIBC, IRONPCTSAT in the last 72 hours. Recent Labs    11/20/24 1417 11/21/24 0316 11/22/24 0333  NA 142 140 141  K  3.9 3.4* 3.2*  CL 113* 111 110  CO2 21* 22 24  GLUCOSE 107* 84 80  BUN 36* 35* 29*  CREATININE 4.44* 4.52* 3.46*  CALCIUM  9.0 8.3* 8.2*   Recent Labs    11/20/24 1417 11/21/24 0316  PROT 5.9* 5.5*  ALBUMIN  3.3* 3.0*  AST 15 12*  ALT 7 7  ALKPHOS 61 52  BILITOT 0.7 0.8      Imaging:  CT ANGIO GI BLEED CLINICAL DATA:  GI bleed.  EXAM: CTA ABDOMEN AND PELVIS WITHOUT AND WITH CONTRAST  TECHNIQUE: Multidetector CT imaging of the abdomen and pelvis was performed using the standard protocol during bolus administration of intravenous contrast. Multiplanar reconstructed images and MIPs were obtained and reviewed to evaluate the vascular anatomy.  RADIATION DOSE REDUCTION: This exam was performed according to the departmental dose-optimization program which includes automated exposure control, adjustment of the mA and/or kV according to patient size and/or use of iterative reconstruction technique.  CONTRAST:  75mL OMNIPAQUE  IOHEXOL  350 MG/ML SOLN  COMPARISON:  CT abdomen pelvis dated 12/02/2020.  FINDINGS: VASCULAR  Aorta: Old dissection flap in the abdominal aorta. There is mild atherosclerotic calcification of the aorta. The true and false lumens appear patent.  Celiac: The celiac artery is patent.  SMA: There is extension of the dissection flap into the SMA. The SMA is patent.  Renals: The right renal arteries are patent. The left linear artery is narrowed and appears scarred with diminished flow.  IMA: The IMA is patent.  Inflow: Extension  of the dissection flap into the left common iliac and external iliac arteries. The iliac arteries are patent bilaterally.  Proximal Outflow: The visualized proximal outflow is patent.  Veins: The IVC is unremarkable. The SMV, splenic vein, and main portal vein are patent. No portal venous gas.  Review of the MIP images confirms the above findings.  NON-VASCULAR  Lower chest: Small bilateral pleural effusions.  Partially visualized pericardial effusion measuring 7 mm in thickness.  No intra-abdominal free air.  Small free fluid in the pelvis.  Hepatobiliary: The liver is unremarkable. The gallbladder is contracted. Small gallstones suspected in the gallbladder.  Pancreas: Unremarkable. No pancreatic ductal dilatation or surrounding inflammatory changes.  Spleen: Normal in size without focal abnormality.  Adrenals/Urinary Tract: Bilateral adrenal thickening/hyperplasia or adenoma similar to prior CT. Left renal parenchyma atrophy. Several left renal cysts. A 12 mm high attenuating lesion from the posterior lower pole of the left kidney as well as a 2 cm high attenuating lesion from the upper pole of the left kidney are not characterized on this CT but were present on the prior CT, likely proteinaceous cysts. No hydronephrosis on either side. The urinary bladder is grossly unremarkable.  Stomach/Bowel: Moderate stool throughout the colon. No bowel obstruction or active inflammation. No evidence of active GI bleed. The appendix is normal.  Lymphatic: No adenopathy.  Reproductive: Hysterectomy.  Other: None  Musculoskeletal: No acute or significant osseous findings.  IMPRESSION: 1. No evidence of active GI bleed. 2. Old dissection flap in the abdominal aorta with extension of the dissection flap into the SMA and left common iliac and external iliac arteries. The aorta and mesenteric vessels are patent. 3. Small bilateral pleural effusions. 4. Partially visualized pericardial effusion. 5. No bowel obstruction. Normal appendix. 6.  Aortic Atherosclerosis (ICD10-I70.0).  Electronically Signed   By: Vanetta Chou M.D.   On: 11/21/2024 19:03     Scheduled inpatient medications:   amLODipine   10 mg Oral Daily   atorvastatin   40 mg Oral QHS   metoprolol  tartrate  25 mg Oral BID   sodium chloride  flush  3 mL Intravenous Q12H   Continuous inpatient infusions:  PRN inpatient  medications: acetaminophen  **OR** acetaminophen , LORazepam , polyethylene glycol  Vital signs in last 24 hours: Temp:  [97.5 F (36.4 C)-98.2 F (36.8 C)] 98.2 F (36.8 C) (12/12 0828) Pulse Rate:  [60-70] 70 (12/12 0246) Resp:  [15-18] 18 (12/12 0828) BP: (105-175)/(77-99) 148/77 (12/12 0828) SpO2:  [95 %-99 %] 95 % (12/12 0828)   No intake or output data in the 24 hours ending 11/23/24 1054  Intake/Output from previous day: 12/11 0701 - 12/12 0700 In: 100 [I.V.:100] Out: -  Intake/Output this shift: No intake/output data recorded.   Physical Exam:  General: Alert female in NAD Heart:  Regular rate and rhythm.  Pulmonary: Normal respiratory effort Abdomen: Soft, nondistended, nontender. Normal bowel sounds. Extremities: No lower extremity edema  Neurologic: Alert and oriented Psych: Pleasant. Cooperative     LOS: 3 days   Vina Dasen ,NP 11/23/2024, 10:54 AM

## 2024-11-23 NOTE — Progress Notes (Signed)
 Assumed care 1900. Patient agitated at start of shift. A&O x4. RN notified by radiology at 2007 they were ready for pt for MRI. RN confirmed with pt she was agreeable to go and radiology notified. Radiology notified RN at 2010 that they actually cannot do the MRI due to not having a body radiologist on tonight. RN went to notify patient and patient but patient was already in the hall screaming about someone stealing her pants/wallet. Both charge RN and this RN attempted to calm patient. Patient verbally aggressive towards staff. Patient refusing to get back in room and threatening to attack/hurt staff, as well as, call people to come up and handle yall. RN able to have patient look through her belongings bag and patient found pants/wallet, however, she still expressed desire to hurt staff or call people to do so. Patient ripped off tele and through across room. Refusing vitals and care. MD notified. Security called and talked with patient.   Around 2250 patient agreeable to take night medications and let RN check BP.   Around 2330 patient called out and RN entered to find large amount of blood trail from bed to bathroom, entire bathroom/toilet and patient covered in bright red blood. Patient extremely agitated. RN attempting to help clean up room. Patient allowed RN to clean the bed and room with bleach, however, patient refusing to let RN enter bathroom to clean/bleach and becomes aggressive when RN steps towards bathroom. Patient saturating multiple pads/briefs and gowns requiring frequent changing. Patient refusing to let RN examine rectum or vitals to be taken.   Around 0130 patient pacing hall upset about her IV and demanding it to be removed. RN attempted to change dressing instead of remove but patient refused. RN educated patient that IV is needed while in the stay, especially given the bleeding, in case something would happen as well as possibly for MRI. Patient expressed understanding and  demanded RN remove IV. IV removed. MD notified.   Around 0300 patient still agitated, refusing care, pacing hallways. MD spoke with patient on phone, see note. After speaking with MD, RN was able to have patient agreeable to BP, HR and O2 checked, as well as, CBC. Patient still refusing IV. CBC drawn and resulted. VSS. MD notified of results.

## 2024-11-23 NOTE — Consult Note (Addendum)
 [facility] CONSULT NOTE  Patient Care Team: Cloria Annabella CROME, DO as PCP - General (Geriatric Medicine) Lynwood Schilling, MD as PCP - Cardiology (Cardiology)  CHIEF COMPLAINTS/PURPOSE OF CONSULTATION:  Rectal cancer  REFERRING PHYSICIAN: Dr. Raenelle  HISTORY OF PRESENTING ILLNESS:  Mary Stephens 68 y.o. female who presented on 11/20/2024 with complaints of rectal bleeding.  Patient was seen by GI and surgical teams.  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  Therefore oncology evaluation has been requested. Patient is seen awake alert and oriented x 4.  She served as principal historian.  Patient states that she was at home resting when she had the urge to use the bathroom, did not look at her underclothes but when she went to the bathroom she saw a lot of blood in the toilet bowl, it looked like someone had turned on the faucet.  Went to see her primary physician and was sent to the ED for further evaluation. Upon further questioning, patient admits to having a similar episode 1 month prior however it went away.  She did not seek medical care at that time.  Admits to lightheadedness on and off.  Denies weight loss although states she does not eat a lot and her appetite is always up-and-down.  Denies chest pain or abdominal pain.  Denies nausea vomiting constipation or diarrhea. Medical history includes diabetes, hypertension, hyperlipidemia, and hepatitis C. Surgical history includes partial hysterectomy and repair of acute aortic dissection. Family history includes 1 sister who died of unknown cancer.  She is not aware of any other family members with cancer. Social history is significant for >55-year tobacco use history, 1 pack every 3 days which she started as a teenager.  She is a current smoker.  Denies alcohol use.  Admits to drug use with coke to keep me up.  Denies occupational hazardous materials exposure, previously worked in an engineer, civil (consulting).         I have reviewed her chart and materials related to her cancer extensively and collaborated history with the patient. Summary of oncologic history is as follows: Oncology History   No problem history exists.    ASSESSMENT & PLAN:  Rectal adenocarcinoma Rectal bleeding - Patient complained of profuse rectal bleeding this week and also 1 month ago which precipitated PCP visit and she was referred to the ED. - Patient has continued to complain of multiple episodes of rectal bleeding during this admission.  States she had a few episodes overnight. -- Denies rectal pain.  - Pathology 11/22/2024 confirms invasive moderately differentiated adenocarcinoma. - Tumor markers CEA pending - GI, surgical, radiation teams following. - Per surgical note, no role for surgery at this time. -Radiation oncology following closely with plans for outpatient PET scan. - Treatment planning for oncologic treatment with chemoradiation is pending.  Patient will be transferred to [facility] for oncologic therapy. -Medical oncology/Dr. Bronson following and will make further evaluation and treatment recommendations.  Anemia - Secondary to rectal bleeding and malignancy - Hemoglobin 7.7.  Status post PRBC transfusion this admission. - Recommend PRBC transfusion for hemoglobin <7.0 - Continue to monitor CBC with differential  Thrombocytopenia - Mild - Platelet count slightly improved.  Platelets 150K today - Continue to monitor CBC with differential  Iron deficiency anemia - Ferritin suboptimal 16, sat 12% with iron 37 - May benefit from IV iron repletion  CKD - Elevated creatinine and BUN with decreased GFR - Avoid nephrotoxic agents - Continue to monitor  renal function  History of hep C, chronic - Completed treatment with Harvoni   Hypertension Hyperlipidemia Diabetes  - Antihypertensives as ordered. - Continue to monitor blood pressure closely - On statin.    MEDICAL  HISTORY:  Past Medical History:  Diagnosis Date   Anxiety    Arthritis    Cataract    forming- very small   Colon polyps    Depression    Diabetes mellitus without complication (HCC)    off meds for diabetes- diet controlled   GERD (gastroesophageal reflux disease)    H/O aortic dissection 2021   Hepatitis C    Hyperlipidemia    Hypertension    Neuropathy    feet    SURGICAL HISTORY: Past Surgical History:  Procedure Laterality Date   COLONOSCOPY  ?2001,2011?   in [place] (1st colon 20 polyps removed-per pt)   IR THORACENTESIS ASP PLEURAL SPACE W/IMG GUIDE  12/09/2020   PARTIAL HYSTERECTOMY  2001   REPAIR OF ACUTE ASCENDING THORACIC AORTIC DISSECTION N/A 12/02/2020   Procedure: REPAIR OF ACUTE ASCENDING THORACIC AORTIC DISSECTION VIA CIRC ARREST USING HEMASHIELD PLATINUM 28 MM VASCULAR GRAFT.;  Surgeon: Shyrl Linnie KIDD, MD;  Location: [facility] OR;  Service: Vascular;  Laterality: N/A;  AXILLARY CANNULATION AND CIRC ARREST   REPAIR OF ACUTE ASCENDING THORACIC AORTIC DISSECTION VIA CIRC ARREST USING HEMASHIELD PLATINUM 28 MM VASCULAR GRAFT. (N/A)  12/02/2020   TEE WITHOUT CARDIOVERSION  12/02/2020   Procedure: TRANSESOPHAGEAL ECHOCARDIOGRAM (TEE);  Surgeon: Shyrl Linnie KIDD, MD;  Location: [facility] OR;  Service: Vascular;;   TRANSESOPHAGEAL ECHOCARDIOGRAM (CATH LAB) N/A 04/17/2024   Procedure: TRANSESOPHAGEAL ECHOCARDIOGRAM;  Surgeon: Shlomo Wilbert SAUNDERS, MD;  Location: [facility] INVASIVE CV LAB;  Service: Cardiovascular;  Laterality: N/A;    SOCIAL HISTORY: Social History   Socioeconomic History   Marital status: Single    Spouse name: Not on file   Number of children: 1   Years of education: Not on file   Highest education level: Not on file  Occupational History   Occupation: unemployed  Tobacco Use   Smoking status: Every Day    Current packs/day: 0.25    Types: Cigarettes   Smokeless tobacco: Never   Tobacco comments:    admits to cuttting back 4 cigarettes/day  Vaping Use    Vaping status: Never Used  Substance and Sexual Activity   Alcohol use: Yes    Alcohol/week: 1.0 standard drink of alcohol    Types: 1 Standard drinks or equivalent per week    Comment: wine cooler   Drug use: Not Currently    Types: Marijuana   Sexual activity: Not on file  Other Topics Concern   Not on file  Social History Narrative   Lives alone.  One daughter.    Social Drivers of Health   Tobacco Use: High Risk (11/22/2024)   Patient History    Smoking Tobacco Use: Every Day    Smokeless Tobacco Use: Never    Passive Exposure: Not on file  Financial Resource Strain: Low Risk  (10/16/2024)   Received from [facility]   Overall Financial Resource Strain (CARDIA)    Difficulty of Paying Living Expenses: Not hard at all  Food Insecurity: No Food Insecurity (10/16/2024)   Received from [facility]   Epic    Within the past 12 months, you worried that your food would run out before you got the money to buy more.: Never true    Within the past 12 months, the  food you bought just didn't last and you didn't have money to get more.: Never true  Transportation Needs: No Transportation Needs (10/16/2024)   Received from [facility] - Transportation    In the past 12 months, has lack of transportation kept you from medical appointments or from getting medications?: No    Lack of Transportation (Non-Medical): No  Physical Activity: Not on file  Stress: Not on file  Social Connections: Socially Isolated (05/01/2024)   Social Connection and Isolation Panel    Frequency of Communication with Friends and Family: Three times a week    Frequency of Social Gatherings with Friends and Family: Twice a week    Attends Religious Services: Never    Database Administrator or Organizations: No    Attends Banker Meetings: Never    Marital Status: Never married  Intimate Partner Violence: Not At Risk (05/01/2024)    Humiliation, Afraid, Rape, and Kick questionnaire    Fear of Current or Ex-Partner: No    Emotionally Abused: No    Physically Abused: No    Sexually Abused: No  Depression (PHQ2-9): Not on file  Alcohol Screen: Not on file  Housing: Low Risk  (10/16/2024)   Received from [facility]   Epic    In the last 12 months, was there a time when you were not able to pay the mortgage or rent on time?: No    In the past 12 months, how many times have you moved where you were living?: 0    At any time in the past 12 months, were you homeless or living in a shelter (including now)?: No  Utilities: Not At Risk (10/16/2024)   Received from [facility]   Epic    In the past 12 months has the electric, gas, oil, or water company threatened to shut off services in your home?: No  Health Literacy: Not on file    FAMILY HISTORY: Family History  Problem Relation Age of Onset   Diabetes Mother    Colon cancer Neg Hx    Colon polyps Neg Hx    Esophageal cancer Neg Hx    Rectal cancer Neg Hx    Stomach cancer Neg Hx      PHYSICAL EXAMINATION: ECOG PERFORMANCE STATUS: 1 - Symptomatic but completely ambulatory  Vitals:   11/23/24 0246 11/23/24 0828  BP: 105/77 (!) 148/77  Pulse: 70   Resp: 18 18  Temp:  98.2 F (36.8 C)  SpO2: 99% 95%   Filed Weights   11/20/24 1728 11/21/24 1440  Weight: 135 lb (61.2 kg) 134 lb 11.2 oz (61.1 kg)    GENERAL: alert, no distress and comfortable +thin-appearing SKIN: skin color, texture, turgor are normal, no rashes or significant lesions EYES: normal, conjunctiva are pink and non-injected, sclera clear OROPHARYNX: no exudate, no erythema and lips, buccal mucosa, and tongue normal  NECK: supple, thyroid  normal size, non-tender, without nodularity LYMPH: no palpable lymphadenopathy in the cervical, axillary or inguinal LUNGS: clear to auscultation and percussion with normal breathing effort HEART: regular rate & rhythm and  no murmurs and no lower extremity edema ABDOMEN: abdomen soft, non-tender and normal bowel sounds MUSCULOSKELETAL: no cyanosis of digits and no clubbing  PSYCH: alert & oriented x 3 with fluent speech NEURO: no focal motor/sensory deficits   ALLERGIES:  has no known allergies.  MEDICATIONS:  Current Facility-Administered Medications  Medication Dose Route Frequency Provider Last Rate  Last Admin   acetaminophen  (TYLENOL ) tablet 650 mg  650 mg Oral Q6H PRN Seena Marsa NOVAK, MD       Or   acetaminophen  (TYLENOL ) suppository 650 mg  650 mg Rectal Q6H PRN Seena Marsa NOVAK, MD       amLODipine  (NORVASC ) tablet 10 mg  10 mg Oral Daily Raenelle Donalda HERO, MD   10 mg at 11/23/24 9171   atorvastatin  (LIPITOR) tablet 40 mg  40 mg Oral QHS Raenelle Donalda HERO, MD   40 mg at 11/22/24 2252   LORazepam  (ATIVAN ) tablet 0.5 mg  0.5 mg Oral PRN Minnie Tinnie BRAVO, PA       metoprolol  tartrate (LOPRESSOR ) tablet 25 mg  25 mg Oral BID Raenelle Donalda HERO, MD   25 mg at 11/23/24 9171   polyethylene glycol (MIRALAX  / GLYCOLAX ) packet 17 g  17 g Oral Daily PRN Seena Marsa NOVAK, MD       sodium chloride  flush (NS) 0.9 % injection 3 mL  3 mL Intravenous Q12H Seena Marsa NOVAK, MD   3 mL at 11/22/24 1003     LABORATORY DATA:  I have reviewed the data as listed Lab Results  Component Value Date   WBC 5.4 11/23/2024   HGB 7.1 (L) 11/23/2024   HCT 21.7 (L) 11/23/2024   MCV 94.3 11/23/2024   PLT 128 (L) 11/23/2024   Recent Labs    05/25/24 1705 11/20/24 1417 11/21/24 0316 11/22/24 0333  NA 143 142 140 141  K 3.3* 3.9 3.4* 3.2*  CL 109 113* 111 110  CO2 23 21* 22 24  GLUCOSE 84 107* 84 80  BUN 58* 36* 35* 29*  CREATININE 4.29* 4.44* 4.52* 3.46*  CALCIUM  8.8* 9.0 8.3* 8.2*  GFRNONAA 11* 10* 10* 14*  PROT 6.8 5.9* 5.5*  --   ALBUMIN  3.8 3.3* 3.0*  --   AST 14* 15 12*  --   ALT 10 7 7   --   ALKPHOS 57 61 52  --   BILITOT 0.4 0.7 0.8  --     RADIOGRAPHIC STUDIES: I have personally  reviewed the radiological images as listed and agreed with the findings in the report. CT ANGIO GI BLEED Result Date: 11/21/2024 CLINICAL DATA:  GI bleed. EXAM: CTA ABDOMEN AND PELVIS WITHOUT AND WITH CONTRAST TECHNIQUE: Multidetector CT imaging of the abdomen and pelvis was performed using the standard protocol during bolus administration of intravenous contrast. Multiplanar reconstructed images and MIPs were obtained and reviewed to evaluate the vascular anatomy. RADIATION DOSE REDUCTION: This exam was performed according to the departmental dose-optimization program which includes automated exposure control, adjustment of the mA and/or kV according to patient size and/or use of iterative reconstruction technique. CONTRAST:  75mL OMNIPAQUE  IOHEXOL  350 MG/ML SOLN COMPARISON:  CT abdomen pelvis dated 12/02/2020. FINDINGS: VASCULAR Aorta: Old dissection flap in the abdominal aorta. There is mild atherosclerotic calcification of the aorta. The true and false lumens appear patent. Celiac: The celiac artery is patent. SMA: There is extension of the dissection flap into the SMA. The SMA is patent. Renals: The right renal arteries are patent. The left linear artery is narrowed and appears scarred with diminished flow. IMA: The IMA is patent. Inflow: Extension of the dissection flap into the left common iliac and external iliac arteries. The iliac arteries are patent bilaterally. Proximal Outflow: The visualized proximal outflow is patent. Veins: The IVC is unremarkable. The SMV, splenic vein, and main portal vein are patent. No portal venous gas. Review  of the MIP images confirms the above findings. NON-VASCULAR Lower chest: Small bilateral pleural effusions. Partially visualized pericardial effusion measuring 7 mm in thickness. No intra-abdominal free air.  Small free fluid in the pelvis. Hepatobiliary: The liver is unremarkable. The gallbladder is contracted. Small gallstones suspected in the gallbladder. Pancreas:  Unremarkable. No pancreatic ductal dilatation or surrounding inflammatory changes. Spleen: Normal in size without focal abnormality. Adrenals/Urinary Tract: Bilateral adrenal thickening/hyperplasia or adenoma similar to prior CT. Left renal parenchyma atrophy. Several left renal cysts. A 12 mm high attenuating lesion from the posterior lower pole of the left kidney as well as a 2 cm high attenuating lesion from the upper pole of the left kidney are not characterized on this CT but were present on the prior CT, likely proteinaceous cysts. No hydronephrosis on either side. The urinary bladder is grossly unremarkable. Stomach/Bowel: Moderate stool throughout the colon. No bowel obstruction or active inflammation. No evidence of active GI bleed. The appendix is normal. Lymphatic: No adenopathy. Reproductive: Hysterectomy. Other: None Musculoskeletal: No acute or significant osseous findings. IMPRESSION: 1. No evidence of active GI bleed. 2. Old dissection flap in the abdominal aorta with extension of the dissection flap into the SMA and left common iliac and external iliac arteries. The aorta and mesenteric vessels are patent. 3. Small bilateral pleural effusions. 4. Partially visualized pericardial effusion. 5. No bowel obstruction. Normal appendix. 6.  Aortic Atherosclerosis (ICD10-I70.0). Electronically Signed   By: Vanetta Chou M.D.   On: 11/21/2024 19:03     The total time spent in the appointment was 55 minutes encounter with patients including review of chart and various tests results, discussions about plan of care and coordination of care plan   All questions were answered. The patient knows to call the clinic with any problems, questions or concerns. No barriers to learning was detected.  Mary PARAS Rouson, Mary Stephens 12/12/20251:20 PM  Addendum I have seen the patient, examined her. I agree with the assessment and and plan and have edited the notes.   68 year old female with past medical history of  hypertension, stage IV-V CKD, drug abuse, presented with rectal bleeding and severe anemia.  On physical exam, she has a visible 3 cm tumor in the anal verge, with no active bleeding.  Her CT abdomen and pelvis was negative for distant metastasis, ET chest and pelvic MRI for staging results are pending.  This is likely at least locally advanced low rectal cancer.  I recommend TNT with concurrent chemoradiation, followed by chemotherapy FOLFOX 4 months if she tolerates.  Difficult part is her severe renal impairment, not a candidate for oral Xeloda with concurrent radiation.  I will reach out to her nephrologist Dr. Tobie, to see if central line is okay, and that would consider dose reduced 5-FU with concurrent radiation.  Due to her severe anemia, CKD, low iron, I recommend IV iron Venofer 300 over 400 mg once in the hospital, and will keep her ferritin above 100 during her cancer treatment.  Patient denies severe rectal pain or other symptoms, okay to discharge from oncology standpoint.  Will plan to treat her in the outpatient setting.  Radiation oncology has been consulted, she will likely have simulation next Monday and start treatment next Tuesday.  I spent a total of 80 minutes for her visit today, including care coordination.  Onita Mattock MD 11/23/2024

## 2024-11-23 NOTE — TOC Progression Note (Signed)
 Transition of Care [facility]) - Progression Note    Patient Details  Name: Mary Stephens MRN: [id number] Date of Birth: [birth year]  Transition of Care [facility]) CM/SW Contact  Landry DELENA Senters, RN Phone Number: 11/23/2024, 3:14 PM  Clinical Narrative:     Continued medical workup. Possible plan to transfer to [facility] at some point if plan is to start I/P radiation.   CM will continue to follow.   Expected Discharge Plan:  (TBD) Barriers to Discharge: Continued Medical Work up               Expected Discharge Plan and Services       Living arrangements for the past 2 months: Apartment                                       Social Drivers of Health (SDOH) Interventions SDOH Screenings   Food Insecurity: No Food Insecurity (10/16/2024)   Received from [facility]  Housing: Low Risk  (10/16/2024)   Received from [facility]  Transportation Needs: No Transportation Needs (10/16/2024)   Received from [facility]  Utilities: Not At Risk (10/16/2024)   Received from [facility]  Financial Resource Strain: Low Risk  (10/16/2024)   Received from [facility]  Social Connections: Socially Isolated (05/01/2024)  Tobacco Use: High Risk (11/22/2024)    Readmission Risk Interventions     No data to display

## 2024-11-23 NOTE — Progress Notes (Signed)
 Peripherally Inserted Central Catheter Placement  The IV Nurse has discussed with the patient and/or persons authorized to consent for the patient, the purpose of this procedure and the potential benefits and risks involved with this procedure.  The benefits include less needle sticks, lab draws from the catheter, and the patient may be discharged home with the catheter. Risks include, but not limited to, infection, bleeding, blood clot (thrombus formation), and puncture of an artery; nerve damage and irregular heartbeat and possibility to perform a PICC exchange if needed/ordered by physician.  Alternatives to this procedure were also discussed.  Bard Power PICC patient education guide, fact sheet on infection prevention and patient information card has been provided to patient /or left at bedside.    PICC Placement Documentation  PICC Double Lumen 11/23/24 Left Brachial 41 cm 0 cm (Active)  Indication for Insertion or Continuance of Line Vasoactive infusions 11/23/24 1800  Exposed Catheter (cm) 0 cm 11/23/24 1800  Site Assessment Clean, Dry, Intact 11/23/24 1800  Lumen #1 Status Flushed;Saline locked;Blood return noted 11/23/24 1800  Lumen #2 Status Flushed;Saline locked;Blood return noted 11/23/24 1800  Dressing Type Transparent;Securing device 11/23/24 1800  Dressing Status Antimicrobial disc/dressing in place;Clean, Dry, Intact 11/23/24 1800  Line Care Connections checked and tightened 11/23/24 1800  Line Adjustment (NICU/IV Team Only) No 11/23/24 1800  Dressing Intervention New dressing;Adhesive placed at insertion site (IV team only) 11/23/24 1800  Dressing Change Due 11/30/24 11/23/24 1800       Ethyl Priestly Renee 11/23/2024, 6:05 PM

## 2024-11-23 NOTE — Care Management Important Message (Signed)
 Important Message  Patient Details  Name: Mary Stephens MRN: [id number] Date of Birth: [birth year]   Important Message Given:  Yes - Medicare IM     Claretta Deed 11/23/2024, 2:52 PM

## 2024-11-24 DIAGNOSIS — E1142 Type 2 diabetes mellitus with diabetic polyneuropathy: Secondary | ICD-10-CM | POA: Diagnosis not present

## 2024-11-24 DIAGNOSIS — K922 Gastrointestinal hemorrhage, unspecified: Secondary | ICD-10-CM | POA: Diagnosis not present

## 2024-11-24 DIAGNOSIS — K219 Gastro-esophageal reflux disease without esophagitis: Secondary | ICD-10-CM | POA: Diagnosis not present

## 2024-11-24 DIAGNOSIS — I10 Essential (primary) hypertension: Secondary | ICD-10-CM | POA: Diagnosis not present

## 2024-11-24 LAB — TYPE AND SCREEN
ABO/RH(D): O POS
Antibody Screen: NEGATIVE
Unit division: 0
Unit division: 0
Unit division: 0

## 2024-11-24 LAB — PREPARE RBC (CROSSMATCH)

## 2024-11-24 LAB — CBC
HCT: 23.6 % — ABNORMAL LOW (ref 36.0–46.0)
HCT: 23 % — ABNORMAL LOW (ref 36.0–46.0)
Hemoglobin: 7.9 g/dL — ABNORMAL LOW (ref 12.0–15.0)
Hemoglobin: 7.5 g/dL — ABNORMAL LOW (ref 12.0–15.0)
MCH: 30.5 pg (ref 26.0–34.0)
MCH: 30.7 pg (ref 26.0–34.0)
MCHC: 33.5 g/dL (ref 30.0–36.0)
MCHC: 32.6 g/dL (ref 30.0–36.0)
MCV: 91.1 fL (ref 80.0–100.0)
MCV: 94.3 fL (ref 80.0–100.0)
Platelets: 115 K/uL — ABNORMAL LOW (ref 150–400)
Platelets: 124 K/uL — ABNORMAL LOW (ref 150–400)
RBC: 2.59 MIL/uL — ABNORMAL LOW (ref 3.87–5.11)
RBC: 2.44 MIL/uL — ABNORMAL LOW (ref 3.87–5.11)
RDW: 17.9 % — ABNORMAL HIGH (ref 11.5–15.5)
RDW: 18.2 % — ABNORMAL HIGH (ref 11.5–15.5)
WBC: 5 K/uL (ref 4.0–10.5)
WBC: 8.3 K/uL (ref 4.0–10.5)
nRBC: 0 % (ref 0.0–0.2)
nRBC: 0 % (ref 0.0–0.2)

## 2024-11-24 LAB — BPAM RBC
Blood Product Expiration Date: 202601112359
Blood Product Expiration Date: 202601032359
Blood Product Expiration Date: 202601032359
ISSUE DATE / TIME: 202512121548
ISSUE DATE / TIME: 202512091708
ISSUE DATE / TIME: 202512092055
Unit Type and Rh: 5100
Unit Type and Rh: 5100
Unit Type and Rh: 5100

## 2024-11-24 LAB — BASIC METABOLIC PANEL WITH GFR
Anion gap: 7 (ref 5–15)
BUN: 30 mg/dL — ABNORMAL HIGH (ref 8–23)
CO2: 25 mmol/L (ref 22–32)
Calcium: 8.1 mg/dL — ABNORMAL LOW (ref 8.9–10.3)
Chloride: 109 mmol/L (ref 98–111)
Creatinine, Ser: 3.75 mg/dL — ABNORMAL HIGH (ref 0.44–1.00)
GFR, Estimated: 12 mL/min — ABNORMAL LOW (ref >60)
Glucose, Bld: 91 mg/dL (ref 70–99)
Potassium: 3.7 mmol/L (ref 3.5–5.1)
Sodium: 141 mmol/L (ref 135–145)

## 2024-11-24 LAB — CEA: CEA: 26.2 ng/mL — ABNORMAL HIGH (ref 0.0–4.7)

## 2024-11-24 MED ORDER — OXYCODONE HCL 5 MG PO TABS
5.0000 mg | ORAL_TABLET | Freq: Four times a day (QID) | ORAL | Status: DC | PRN
  Administered 2024-11-24 – 2024-12-01 (×3): 5 mg via ORAL
  Filled 2024-11-24 (×4): qty 1

## 2024-11-24 MED ORDER — SODIUM CHLORIDE 0.9% IV SOLUTION: Freq: Once | INTRAVENOUS | Status: AC

## 2024-11-24 MED ORDER — ACETAMINOPHEN 325 MG PO TABS
650.0000 mg | ORAL_TABLET | Freq: Once | ORAL | Status: AC
  Administered 2024-11-24: 650 mg via ORAL
  Filled 2024-11-24: qty 2

## 2024-11-24 MED ORDER — DIPHENHYDRAMINE HCL 25 MG PO CAPS
25.0000 mg | ORAL_CAPSULE | Freq: Once | ORAL | Status: AC
  Administered 2024-11-24: 25 mg via ORAL
  Filled 2024-11-24: qty 1

## 2024-11-24 MED ORDER — HYDROMORPHONE HCL 1 MG/ML IJ SOLN
0.5000 mg | INTRAMUSCULAR | Status: DC | PRN
  Administered 2024-11-24 – 2024-12-01 (×11): 0.5 mg via INTRAVENOUS
  Filled 2024-11-24 (×11): qty 0.5

## 2024-11-24 NOTE — Plan of Care (Signed)

## 2024-11-24 NOTE — Progress Notes (Signed)
 PROGRESS NOTE        PATIENT DETAILS Name: Mary Stephens Age: 68 y.o. Sex: female Date of Birth: June 22, 1956 Admit Date: 11/20/2024 Admitting Physician Marsa KATHEE Scurry, MD ERE:Mzzi, Tiffany L, DO  Brief Summary: Patient is a 68 y.o.  female with history of CKD 4, HTN, HLD, type A Aortic dissection-s/p hemiarch repair 2021-presented with hematochezia with ABLA and subsequently admitted to the hospitalist service.  Evaluated by GI-underwent flex sig on 12/11-which showed anorectal mass-biopsy positive for adenocarcinoma.  Hospital course complicated by intermittent ongoing hematochezia-per GI-no endoscopic means to stop bleeding-recommendations are to initiate chemo/radiation-plans are to transfer to [facility] for concurrent chemo/radiation.  Significant events: 12/9>> admit to [facility]  Significant studies: 12/10>> CTA GI bleed: No evidence of active GI bleed. 12/12>> MRI pelvis rectal CA staging: Anal primary stage is T3 N1b 12/12>> CT chest: No metastatic disease  Significant microbiology data: None  Procedures: 12/11>> flex sig: Mass extending from the dentate line and prolapsing out of the anus 12/12>> PICC line placed (note oncology discussed with primary nephrology-Dr. Tobie given CKD-okay to place in nondominant arm)  Consults: GI Radiation oncology Medical oncology General Surgery  Subjective: Multiple episodes of hematochezia this morning-complains of pain around the anal verge as well.  Agitated-seems frustrated that she has to wait until Monday to begin chemo/radiation.  Objective: Vitals: Blood pressure 137/87, pulse 73, temperature 97.8 F (36.6 C), temperature source Oral, resp. rate 18, height 5' 7.99 (1.727 m), weight 61.1 kg, SpO2 95%.   Exam: Awake/alert Nonfocal exam Chest: Clear to auscultation  Pertinent Labs/Radiology:    Latest Ref Rng & Units 11/24/2024   10:00 AM 11/24/2024    4:39 AM 11/23/2024   11:53 AM   CBC  WBC 4.0 - 10.5 K/uL 8.3  5.0  5.4   Hemoglobin 12.0 - 15.0 g/dL 7.5  7.9  7.1   Hematocrit 36.0 - 46.0 % 23.0  23.6  21.7   Platelets 150 - 400 K/uL 124  115  128     Lab Results  Component Value Date   NA 141 11/24/2024   K 3.7 11/24/2024   CL 109 11/24/2024   CO2 25 11/24/2024      Assessment/Plan: Lower GI bleeding acute blood loss anemia secondary to anorectal mass-biopsy suggestive of rectal adenocarcinoma  Continues to have intermittent hematochezia/blood clots-more bleeding this morning but stable hemodynamically Hb downtrending-Will again transfuse 1 unit of PRBC today (will be fourth unit-transfused yesterday as well) Unfortunately-no endoscopy means to stop bleeding per GI-needs to be started on chemo/radiation-this is being planned to start next week, plan is to initiate transfer to [facility] over the weekend.  In the meantime-follow CBC and transfuse as needed until we can start chemotherapy next week. GI/radiation oncology/medical oncology following. As needed narcotics for anal pain.  CKD 4 At baseline Follow electrolytes Please note-oncologist-Dr. Lanny discussed with Dr. Tobie on 12/12 (patient's primary nephrologist)-subsequently PICC line placed on nondominant arm for initiation of chemotherapy next week.  HTN BP stable Allow some amount of hypertension due to ongoing GI bleeding Cautiously continue with amlodipine  and half of home dosing of metoprolol  Continue to hold hydralazine  Follow/optimize.    HLD Statin  Chronic HCV Completed treatment with [facility]  per prior notes  Type A Aortic dissection-s/p hemiarch repair 2021  DM-2 (A1c 4.5 on 5/20) Diet controlled  Code  status:   Code Status: Full Code   DVT Prophylaxis: SCDs Start: 11/20/24 1747    Family Communication: None at bedside-patient is to update her daughter herself.   Disposition Plan: Status is: Inpatient Remains inpatient appropriate because: Severity of illness    Planned Discharge Destination:Home   Diet: Diet Order             Diet regular Room service appropriate? Yes; Fluid consistency: Thin  Diet effective now                     Antimicrobial agents: Anti-infectives (From admission, onward)    None        MEDICATIONS: Scheduled Meds:  sodium chloride    Intravenous Once   acetaminophen   650 mg Oral Once   amLODipine   10 mg Oral Daily   atorvastatin   40 mg Oral QHS   Chlorhexidine  Gluconate Cloth  6 each Topical Daily   diphenhydrAMINE   25 mg Oral Once   metoprolol  tartrate  25 mg Oral BID   sodium chloride  flush  10-40 mL Intracatheter Q12H   sodium chloride  flush  3 mL Intravenous Q12H   Continuous Infusions: PRN Meds:.acetaminophen  **OR** acetaminophen , HYDROmorphone  (DILAUDID ) injection, LORazepam , oxyCODONE , polyethylene glycol, sodium chloride  flush   I have personally reviewed following labs and imaging studies  LABORATORY DATA: CBC: Recent Labs  Lab 11/22/24 0333 11/23/24 0255 11/23/24 1153 11/24/24 0439 11/24/24 1000  WBC 4.3 6.8 5.4 5.0 8.3  HGB 8.1* 7.7* 7.1* 7.9* 7.5*  HCT 24.6* 23.8* 21.7* 23.6* 23.0*  MCV 93.9 93.0 94.3 91.1 94.3  PLT 141* 150 128* 115* 124*    Basic Metabolic Panel: Recent Labs  Lab 11/20/24 1417 11/21/24 0316 11/22/24 0333 11/24/24 0439  NA 142 140 141 141  K 3.9 3.4* 3.2* 3.7  CL 113* 111 110 109  CO2 21* 22 24 25   GLUCOSE 107* 84 80 91  BUN 36* 35* 29* 30*  CREATININE 4.44* 4.52* 3.46* 3.75*  CALCIUM  9.0 8.3* 8.2* 8.1*    GFR: Estimated Creatinine Clearance: 13.5 mL/min (A) (by C-G formula based on SCr of 3.75 mg/dL (H)).  Liver Function Tests: Recent Labs  Lab 11/20/24 1417 11/21/24 0316  AST 15 12*  ALT 7 7  ALKPHOS 61 52  BILITOT 0.7 0.8  PROT 5.9* 5.5*  ALBUMIN  3.3* 3.0*   No results for input(s): LIPASE, AMYLASE in the last 168 hours. No results for input(s): AMMONIA in the last 168 hours.  Coagulation Profile: No results for  input(s): INR, PROTIME in the last 168 hours.  Cardiac Enzymes: No results for input(s): CKTOTAL, CKMB, CKMBINDEX, TROPONINI in the last 168 hours.  BNP (last 3 results) No results for input(s): PROBNP in the last 8760 hours.  Lipid Profile: No results for input(s): CHOL, HDL, LDLCALC, TRIG, CHOLHDL, LDLDIRECT in the last 72 hours.  Thyroid  Function Tests: No results for input(s): TSH, T4TOTAL, FREET4, T3FREE, THYROIDAB in the last 72 hours.  Anemia Panel: Recent Labs    11/23/24 1153  VITAMINB12 469  FOLATE >20.0  FERRITIN 16  TIBC 298  IRON  37    Urine analysis:    Component Value Date/Time   COLORURINE YELLOW 05/25/2024 1705   APPEARANCEUR CLOUDY (A) 05/25/2024 1705   LABSPEC 1.010 05/25/2024 1705   PHURINE 5.0 05/25/2024 1705   GLUCOSEU NEGATIVE 05/25/2024 1705   HGBUR NEGATIVE 05/25/2024 1705   BILIRUBINUR NEGATIVE 05/25/2024 1705   KETONESUR NEGATIVE 05/25/2024 1705   PROTEINUR 30 (A) 05/25/2024 1705  NITRITE NEGATIVE 05/25/2024 1705   LEUKOCYTESUR TRACE (A) 05/25/2024 1705    Sepsis Labs: Lactic Acid, Venous    Component Value Date/Time   LATICACIDVEN 0.4 (L) 04/30/2024 2156    MICROBIOLOGY: No results found for this or any previous visit (from the past 240 hours).  RADIOLOGY STUDIES/RESULTS: CT CHEST WO CONTRAST Result Date: 11/23/2024 EXAM: CT CHEST WITHOUT CONTRAST 11/23/2024 07:55:44 PM TECHNIQUE: CT of the chest was performed without the administration of intravenous contrast. Multiplanar reformatted images are provided for review. Automated exposure control, iterative reconstruction, and/or weight based adjustment of the mA/kV was utilized to reduce the radiation dose to as low as reasonably achievable. COMPARISON: 04/15/2024 CLINICAL HISTORY: Rectal cancer, staging. 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 Cardiomegaly. Small pericardial effusion. Mild 3-vessel  coronary atherosclerosis. Aorta: Suspected stable thoracic aortic dissection (images 23 and 61), poorly visualized on unenhanced CT. Associated stable thoracic aortic aneurysm at the level of the transverse aortic arch measuring 4.4 cm (image 19). Central airways: The central airways are clear. Vessels: Left PICC terminates in upper SVC. LYMPH NODES: No mediastinal, hilar or axillary lymphadenopathy. LUNGS AND PLEURA: Scattered calcified granulomata, benign. Two left upper lobe pulmonary nodules measuring up to 3 mm (images 50 to 51), unchanged, benign. Mild linear scarring/atelectasis in bilateral lower lobes. No pleural effusion or pneumothorax. SOFT TISSUES/BONES: Median sternotomy. No acute abnormality of the bones or soft tissues. UPPER ABDOMEN: Limited images of the upper abdomen demonstrate an atrophic left kidney. 2.1 cm right adrenal nodule compatible with benign adrenal adenoma. IMPRESSION: 1. No evidence of metastatic disease. Two stable 3 mm left upper lobe nodules, benign. 2. Status post aortic valve/root repair. Suspected stable thoracic aortic dissection with associated aneurysm, as above. 3. Cardiomegaly with a small pericardial effusion. Electronically signed by: Pinkie Pebbles MD 11/23/2024 09:44 PM EST RP Workstation: [id number]   US  EKG SITE RITE Result Date: 11/23/2024 If Site Rite image not attached, placement could not be confirmed due to current cardiac rhythm.  MR PELVIS WO CM RECTAL CA STAGING Result Date: 11/23/2024 CLINICAL DATA:  New diagnosis of anal cancer. EXAM: MRI PELVIS WITHOUT CONTRAST TECHNIQUE: Multiplanar multisequence MR imaging of the pelvis was performed. No intravenous contrast was administered. Ultrasound gel was administered per rectum to optimize tumor evaluation. COMPARISON:  CTA of 11/21/2024 FINDINGS: TUMOR LOCATION Tumor distance from Anal Verge/Skin surface: Not applicable. Appears to protrude from the anus, including on image 32/5. Tumor distance to  Internal Anal sphincter: Not applicable. TUMOR DESCRIPTION Circumferential extent: Circumferential, including on image 31/5. Tumor Size and volume: 5.3 by 2.7 x 2.8 cm on images 18/3 and 31/5. Volume 20.8 cc T - CATEGORY Based on lesion size and presumed anal primary, T3. Levator Ani Involvement: Present, especially eccentric right and posteriorly on image 31/5. N - CATEGORY Right inguinal adenopathy at 1.5 cm on image 15/5. This node is hyperenhancing on image 185/9 of the prior CTA. A right external iliac 9 mm node on image 8/5 is also hyperenhancing on the prior CTA. N1b. Other: No significant free fluid. A left ovarian T2 hypointense 2.5 x 2.0 cm on image 13/5. No bowel obstruction. The patient halted the exam after the initial T2 weighted imaging was performed. IMPRESSION: Exam was halted at patient request after the initial T2 weighted images. Anal primary, staged as T3, N1b as detailed above. Involvement of the levator ani, as detailed above. 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 Consider nonemergent outpatient follow-up pre and postcontrast  MRI at 3 months versus ultrasound surveillance. Electronically Signed   By: Rockey Kilts M.D.   On: 11/23/2024 13:57     LOS: 4 days   Donalda Applebaum, MD  [facility]    To contact the attending provider between 7A-7P or the covering provider during after hours 7P-7A, please log into the web site [email or web address] and access using universal [place] password for that web site. If you do not have the password, please call the hospital operator.  11/24/2024, 11:17 AM

## 2024-11-24 NOTE — Progress Notes (Signed)
 Progress Note  2 Days Post-Op  Subjective: Patient reports bowel function. Had more bleeding when having bowel movements. Having some discomfort. Tolerating PO intake. Having flatulence. Denies n/v.   ROS  All negative with the exception of above.  Objective: Vital signs in last 24 hours: Temp:  [97.8 F (36.6 C)-98.8 F (37.1 C)] 97.8 F (36.6 C) (12/13 0305) Pulse Rate:  [66-81] 73 (12/13 0823) Resp:  [16-18] 18 (12/12 1849) BP: (95-169)/(78-94) 137/87 (12/13 1021) SpO2:  [94 %-99 %] 95 % (12/13 1021) Last BM Date : 11/23/24  Intake/Output from previous day: 12/12 0701 - 12/13 0700 In: 334 [Blood:334] Out: -  Intake/Output this shift: No intake/output data recorded.  PE: Gen:  Alert, NAD, pleasant Pulm:  Normal effort, clear to auscultation bilaterally Abd: Soft, non-tender, non-distended GU: protruding anal mass without active bleeding.  Skin: warm and dry, no rashes  Psych: A&Ox3    Lab Results:  Recent Labs    11/24/24 0439 11/24/24 1000  WBC 5.0 8.3  HGB 7.9* 7.5*  HCT 23.6* 23.0*  PLT 115* 124*   BMET Recent Labs    11/22/24 0333 11/24/24 0439  NA 141 141  K 3.2* 3.7  CL 110 109  CO2 24 25  GLUCOSE 80 91  BUN 29* 30*  CREATININE 3.46* 3.75*  CALCIUM  8.2* 8.1*   PT/INR No results for input(s): LABPROT, INR in the last 72 hours. CMP     Component Value Date/Time   NA 141 11/24/2024 0439   NA 143 05/10/2024 1524   K 3.7 11/24/2024 0439   CL 109 11/24/2024 0439   CO2 25 11/24/2024 0439   GLUCOSE 91 11/24/2024 0439   BUN 30 (H) 11/24/2024 0439   BUN 48 (H) 05/10/2024 1524   CREATININE 3.75 (H) 11/24/2024 0439   CREATININE 1.21 (H) 10/20/2016 1037   CALCIUM  8.1 (L) 11/24/2024 0439   PROT 5.5 (L) 11/21/2024 0316   PROT 7.6 01/27/2018 1518   ALBUMIN  3.0 (L) 11/21/2024 0316   ALBUMIN  4.4 01/27/2018 1518   AST 12 (L) 11/21/2024 0316   ALT 7 11/21/2024 0316   ALKPHOS 52 11/21/2024 0316   BILITOT 0.8 11/21/2024 0316   BILITOT 0.3  01/27/2018 1518   GFRNONAA 12 (L) 11/24/2024 0439   GFRNONAA 48 (L) 10/20/2016 1037   GFRAA 41 (L) 01/27/2018 1518   GFRAA 55 (L) 10/20/2016 1037   Lipase     Component Value Date/Time   LIPASE 34 05/25/2024 1705       Studies/Results: CT CHEST WO CONTRAST Result Date: 11/23/2024 EXAM: CT CHEST WITHOUT CONTRAST 11/23/2024 07:55:44 PM TECHNIQUE: CT of the chest was performed without the administration of intravenous contrast. Multiplanar reformatted images are provided for review. Automated exposure control, iterative reconstruction, and/or weight based adjustment of the mA/kV was utilized to reduce the radiation dose to as low as reasonably achievable. COMPARISON: 04/15/2024 CLINICAL HISTORY: Rectal cancer, staging. 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 Cardiomegaly. Small pericardial effusion. Mild 3-vessel coronary atherosclerosis. Aorta: Suspected stable thoracic aortic dissection (images 23 and 61), poorly visualized on unenhanced CT. Associated stable thoracic aortic aneurysm at the level of the transverse aortic arch measuring 4.4 cm (image 19). Central airways: The central airways are clear. Vessels: Left PICC terminates in upper SVC. LYMPH NODES: No mediastinal, hilar or axillary lymphadenopathy. LUNGS AND PLEURA: Scattered calcified granulomata, benign. Two left upper lobe pulmonary nodules measuring up to 3 mm (images 50 to 51), unchanged, benign. Mild linear  scarring/atelectasis in bilateral lower lobes. No pleural effusion or pneumothorax. SOFT TISSUES/BONES: Median sternotomy. No acute abnormality of the bones or soft tissues. UPPER ABDOMEN: Limited images of the upper abdomen demonstrate an atrophic left kidney. 2.1 cm right adrenal nodule compatible with benign adrenal adenoma. IMPRESSION: 1. No evidence of metastatic disease. Two stable 3 mm left upper lobe nodules, benign. 2. Status post aortic valve/root repair. Suspected stable  thoracic aortic dissection with associated aneurysm, as above. 3. Cardiomegaly with a small pericardial effusion. Electronically signed by: Pinkie Pebbles MD 11/23/2024 09:44 PM EST RP Workstation: [id number]   US  EKG SITE RITE Result Date: 11/23/2024 If Site Rite image not attached, placement could not be confirmed due to current cardiac rhythm.  MR PELVIS WO CM RECTAL CA STAGING Result Date: 11/23/2024 CLINICAL DATA:  New diagnosis of anal cancer. EXAM: MRI PELVIS WITHOUT CONTRAST TECHNIQUE: Multiplanar multisequence MR imaging of the pelvis was performed. No intravenous contrast was administered. Ultrasound gel was administered per rectum to optimize tumor evaluation. COMPARISON:  CTA of 11/21/2024 FINDINGS: TUMOR LOCATION Tumor distance from Anal Verge/Skin surface: Not applicable. Appears to protrude from the anus, including on image 32/5. Tumor distance to Internal Anal sphincter: Not applicable. TUMOR DESCRIPTION Circumferential extent: Circumferential, including on image 31/5. Tumor Size and volume: 5.3 by 2.7 x 2.8 cm on images 18/3 and 31/5. Volume 20.8 cc T - CATEGORY Based on lesion size and presumed anal primary, T3. Levator Ani Involvement: Present, especially eccentric right and posteriorly on image 31/5. N - CATEGORY Right inguinal adenopathy at 1.5 cm on image 15/5. This node is hyperenhancing on image 185/9 of the prior CTA. A right external iliac 9 mm node on image 8/5 is also hyperenhancing on the prior CTA. N1b. Other: No significant free fluid. A left ovarian T2 hypointense 2.5 x 2.0 cm on image 13/5. No bowel obstruction. The patient halted the exam after the initial T2 weighted imaging was performed. IMPRESSION: Exam was halted at patient request after the initial T2 weighted images. Anal primary, staged as T3, N1b as detailed above. Involvement of the levator ani, as detailed above. 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 Consider nonemergent outpatient  follow-up pre and postcontrast MRI at 3 months versus ultrasound surveillance. Electronically Signed   By: Rockey Kilts M.D.   On: 11/23/2024 13:57    Anti-infectives: Anti-infectives (From admission, onward)    None        Assessment/Plan Hematochezia Mass at dentate line prolapsing into anus  -GI was consulted and on exam there was a firm perianal mass found. She underwent flexible sigmoidoscopy with bioipsies were completed on 12/10. Results are invasive moderately differentiated adenocarcinoma.  -Afebrile. -Most recent CBC: HGB 7.5 from 7.9 and WBC 8.3 -MRI from 12/12 showed ana primary staged as T3, N1b.  -CT chest from 12/12 showed no evidence of metastatic disease.  -CT angio GI bleed without GI bleed.  -CEA 26.2 -Plans to have chemo/radiation treatment early this week at [facility].    FEN: Regular: IVF per primary team VTE: SCDs; Held due to above. ID: None   Per [facility] CKD4 HTN HLD Chronic HCV Type A Aortic dissection - s/p hemiarch repair 2021 DMT2 GERD Hemorrhoids    LOS: 4 days   I reviewed specialist notes, hospitalist notes, consulting provider notes, nursing notes, last 24 h vitals and pain scores, last 48 h intake and output, last 24 h labs and trends, and last 24 h imaging results.  This care required moderate level  of medical decision making.    Marjorie Carlyon Favre, [facility] 11/24/2024, 11:39 AM Please see Amion for pager number during day hours 7:00am-4:30pm

## 2024-11-25 ENCOUNTER — Encounter (HOSPITAL_COMMUNITY): Payer: Self-pay | Admitting: Gastroenterology

## 2024-11-25 LAB — CBC
HCT: 23.3 % — ABNORMAL LOW (ref 36.0–46.0)
HCT: 24.2 % — ABNORMAL LOW (ref 36.0–46.0)
Hemoglobin: 7.7 g/dL — ABNORMAL LOW (ref 12.0–15.0)
Hemoglobin: 7.7 g/dL — ABNORMAL LOW (ref 12.0–15.0)
MCH: 31 pg (ref 26.0–34.0)
MCH: 30.3 pg (ref 26.0–34.0)
MCHC: 33 g/dL (ref 30.0–36.0)
MCHC: 31.8 g/dL (ref 30.0–36.0)
MCV: 94 fL (ref 80.0–100.0)
MCV: 95.3 fL (ref 80.0–100.0)
Platelets: 111 K/uL — ABNORMAL LOW (ref 150–400)
Platelets: 126 K/uL — ABNORMAL LOW (ref 150–400)
RBC: 2.48 MIL/uL — ABNORMAL LOW (ref 3.87–5.11)
RBC: 2.54 MIL/uL — ABNORMAL LOW (ref 3.87–5.11)
RDW: 17.8 % — ABNORMAL HIGH (ref 11.5–15.5)
RDW: 17.8 % — ABNORMAL HIGH (ref 11.5–15.5)
WBC: 5.5 K/uL (ref 4.0–10.5)
WBC: 5.7 K/uL (ref 4.0–10.5)
nRBC: 0 % (ref 0.0–0.2)
nRBC: 0 % (ref 0.0–0.2)

## 2024-11-25 LAB — TYPE AND SCREEN
ABO/RH(D): O POS
ABO/RH(D): O POS
Antibody Screen: NEGATIVE
Antibody Screen: NEGATIVE
Unit division: 0

## 2024-11-25 LAB — BASIC METABOLIC PANEL WITH GFR
Anion gap: 9 (ref 5–15)
BUN: 34 mg/dL — ABNORMAL HIGH (ref 8–23)
CO2: 25 mmol/L (ref 22–32)
Calcium: 8.2 mg/dL — ABNORMAL LOW (ref 8.9–10.3)
Chloride: 111 mmol/L (ref 98–111)
Creatinine, Ser: 3.92 mg/dL — ABNORMAL HIGH (ref 0.44–1.00)
GFR, Estimated: 12 mL/min — ABNORMAL LOW (ref >60)
Glucose, Bld: 108 mg/dL — ABNORMAL HIGH (ref 70–99)
Potassium: 4.1 mmol/L (ref 3.5–5.1)
Sodium: 145 mmol/L (ref 135–145)

## 2024-11-25 LAB — BPAM RBC
Blood Product Expiration Date: 202601112359
ISSUE DATE / TIME: 202512131512
Unit Type and Rh: 5100

## 2024-11-25 MED ORDER — SENNOSIDES-DOCUSATE SODIUM 8.6-50 MG PO TABS
1.0000 | ORAL_TABLET | Freq: Two times a day (BID) | ORAL | Status: DC
  Administered 2024-11-25 – 2024-12-01 (×13): 1 via ORAL
  Filled 2024-11-25 (×13): qty 1

## 2024-11-25 MED ORDER — LIDOCAINE 5 % EX OINT
TOPICAL_OINTMENT | Freq: Three times a day (TID) | CUTANEOUS | Status: DC | PRN
  Filled 2024-11-25: qty 35.44

## 2024-11-25 MED ORDER — LORAZEPAM 1 MG PO TABS
1.0000 mg | ORAL_TABLET | ORAL | Status: DC | PRN
  Administered 2024-11-25 – 2024-11-30 (×5): 1 mg via ORAL
  Filled 2024-11-25 (×5): qty 1

## 2024-11-25 MED ORDER — SODIUM CHLORIDE 0.9 % IV SOLN: INTRAVENOUS | Status: AC

## 2024-11-25 NOTE — Progress Notes (Signed)
 PROGRESS NOTE        PATIENT DETAILS Name: Mary Stephens Age: 68 y.o. Sex: female Date of Birth: [birth year] Admit Date: 11/20/2024 Admitting Physician Marsa KATHEE Scurry, MD ERE:Mzzi, Tiffany L, DO  Brief Summary: Patient is a 68 y.o.  female with history of CKD 4, HTN, HLD, type A Aortic dissection-s/p hemiarch repair 2021-presented with hematochezia with ABLA and subsequently admitted to the hospitalist service.  Evaluated by GI-underwent flex sig on 12/11-which showed anorectal mass-biopsy positive for adenocarcinoma.  Hospital course complicated by intermittent ongoing hematochezia-per GI-no endoscopic means to stop bleeding-recommendations are to initiate chemo/radiation-plans are to transfer to [facility] for concurrent chemo/radiation.  Transferred to [facility] 12/13 in anticipation of starting chemo and radiation this coming week.  Significant events: 12/9>> admit to [facility] 12/13>> transfer to [facility]  Significant studies: 12/10>> CTA GI bleed: No evidence of active GI bleed. 12/12>> MRI pelvis rectal CA staging: Anal primary stage is T3 N1b 12/12>> CT chest: No metastatic disease  Significant microbiology data: None  Procedures: 12/11>> flex sig: Mass extending from the dentate line and prolapsing out of the anus 12/12>> PICC line placed (note oncology discussed with primary nephrology-Dr. Tobie given CKD-okay to place in nondominant arm)  Consults: GI Radiation oncology Medical oncology General Surgery  Subjective: Seen and examined this morning with her RN at bedside. Bright red blood per rectum continues especially when wiping.  Significant pain at the rectal verge, feels anxious.  Objective: Vitals: Blood pressure (!) 145/84, pulse 74, temperature 98.2 F (36.8 C), resp. rate 16, height 5' 7.99 (1.727 m), weight 61.1 kg, SpO2 97%.   Exam: General:  Alert, oriented, calm, in no acute distress but looks anxious, sitting up at the bedside  having breakfast Cardiovascular: RRR, no murmurs or rubs, no peripheral edema  Respiratory: clear to auscultation bilaterally, no wheezes, no crackles  Abdomen: soft, nontender, nondistended, normal bowel tones heard  Skin: dry, no rashes  Musculoskeletal: no joint effusions, normal range of motion  Psychiatric: appropriate affect, normal speech but anxious as noted above Neurologic: extraocular muscles intact, clear speech, moving all extremities with intact sensorium  Pertinent Labs/Radiology:    Latest Ref Rng & Units 11/25/2024    4:00 AM 11/24/2024   10:00 AM 11/24/2024    4:39 AM  CBC  WBC 4.0 - 10.5 K/uL 5.5  8.3  5.0   Hemoglobin 12.0 - 15.0 g/dL 7.7  7.5  7.9   Hematocrit 36.0 - 46.0 % 23.3  23.0  23.6   Platelets 150 - 400 K/uL 111  124  115     Lab Results  Component Value Date   NA 145 11/25/2024   K 4.1 11/25/2024   CL 111 11/25/2024   CO2 25 11/25/2024      Assessment/Plan: Lower GI bleeding acute blood loss anemia secondary to anorectal mass-biopsy suggestive of rectal adenocarcinoma  Continues to have intermittent hematochezia/blood clots-more bleeding this morning but stable hemodynamically Hb downtrending, thus far has been transfused 4 units PRBC. Unfortunately-no endoscopy means to stop bleeding per GI-needs to be started on chemo/radiation-this will start this coming week In the meantime-follow CBC and transfuse as needed until we can start chemotherapy next week. GI/radiation oncology/medical oncology following. As needed narcotics for anal pain, also on stool softeners to minimize symptoms  CKD 4 At baseline Follow electrolytes Please note-oncologist-Dr. Lanny discussed  with Dr. Tobie on 12/12 (patient's primary nephrologist)-subsequently PICC line placed on nondominant arm for initiation of chemotherapy next week.  HTN BP stable Allow some amount of hypertension due to ongoing GI bleeding Cautiously continue with amlodipine  and half of home dosing  of metoprolol  Continue to hold hydralazine  Follow/optimize.    HLD Statin  Chronic HCV Completed treatment with Harvoni  per prior notes  Type A Aortic dissection-s/p hemiarch repair 2021  DM-2 (A1c 4.5 on 5/20) Diet controlled  Code status:   Code Status: Full Code   DVT Prophylaxis: SCDs Start: 11/20/24 1747  Family Communication: None at bedside-patient has been updating her daughter herself, all questions answered   Disposition Plan: Status is: Inpatient Remains inpatient appropriate because: Severity of illness   Planned Discharge Destination:Home   Diet: Diet Order             Diet regular Room service appropriate? Yes; Fluid consistency: Thin  Diet effective now                     Antimicrobial agents: Anti-infectives (From admission, onward)    None        MEDICATIONS: Scheduled Meds:  amLODipine   10 mg Oral Daily   atorvastatin   40 mg Oral QHS   Chlorhexidine  Gluconate Cloth  6 each Topical Daily   metoprolol  tartrate  25 mg Oral BID   sodium chloride  flush  10-40 mL Intracatheter Q12H   sodium chloride  flush  3 mL Intravenous Q12H   Continuous Infusions: PRN Meds:.acetaminophen  **OR** acetaminophen , HYDROmorphone  (DILAUDID ) injection, lidocaine , LORazepam , LORazepam , oxyCODONE , polyethylene glycol, sodium chloride  flush   I have personally reviewed following labs and imaging studies  LABORATORY DATA: CBC: Recent Labs  Lab 11/23/24 0255 11/23/24 1153 11/24/24 0439 11/24/24 1000 11/25/24 0400  WBC 6.8 5.4 5.0 8.3 5.5  HGB 7.7* 7.1* 7.9* 7.5* 7.7*  HCT 23.8* 21.7* 23.6* 23.0* 23.3*  MCV 93.0 94.3 91.1 94.3 94.0  PLT 150 128* 115* 124* 111*    Basic Metabolic Panel: Recent Labs  Lab 11/20/24 1417 11/21/24 0316 11/22/24 0333 11/24/24 0439 11/25/24 0400  NA 142 140 141 141 145  K 3.9 3.4* 3.2* 3.7 4.1  CL 113* 111 110 109 111  CO2 21* 22 24 25 25   GLUCOSE 107* 84 80 91 108*  BUN 36* 35* 29* 30* 34*  CREATININE  4.44* 4.52* 3.46* 3.75* 3.92*  CALCIUM  9.0 8.3* 8.2* 8.1* 8.2*    GFR: Estimated Creatinine Clearance: 12.9 mL/min (A) (by C-G formula based on SCr of 3.92 mg/dL (H)).  Liver Function Tests: Recent Labs  Lab 11/20/24 1417 11/21/24 0316  AST 15 12*  ALT 7 7  ALKPHOS 61 52  BILITOT 0.7 0.8  PROT 5.9* 5.5*  ALBUMIN  3.3* 3.0*   No results for input(s): LIPASE, AMYLASE in the last 168 hours. No results for input(s): AMMONIA in the last 168 hours.  Coagulation Profile: No results for input(s): INR, PROTIME in the last 168 hours.  Cardiac Enzymes: No results for input(s): CKTOTAL, CKMB, CKMBINDEX, TROPONINI in the last 168 hours.  BNP (last 3 results) No results for input(s): PROBNP in the last 8760 hours.  Lipid Profile: No results for input(s): CHOL, HDL, LDLCALC, TRIG, CHOLHDL, LDLDIRECT in the last 72 hours.  Thyroid  Function Tests: No results for input(s): TSH, T4TOTAL, FREET4, T3FREE, THYROIDAB in the last 72 hours.  Anemia Panel: Recent Labs    11/23/24 1153  VITAMINB12 469  FOLATE >20.0  FERRITIN 16  TIBC 298  IRON  37    Urine analysis:    Component Value Date/Time   COLORURINE YELLOW 05/25/2024 1705   APPEARANCEUR CLOUDY (A) 05/25/2024 1705   LABSPEC 1.010 05/25/2024 1705   PHURINE 5.0 05/25/2024 1705   GLUCOSEU NEGATIVE 05/25/2024 1705   HGBUR NEGATIVE 05/25/2024 1705   BILIRUBINUR NEGATIVE 05/25/2024 1705   KETONESUR NEGATIVE 05/25/2024 1705   PROTEINUR 30 (A) 05/25/2024 1705   NITRITE NEGATIVE 05/25/2024 1705   LEUKOCYTESUR TRACE (A) 05/25/2024 1705    Sepsis Labs: Lactic Acid, Venous    Component Value Date/Time   LATICACIDVEN 0.4 (L) 04/30/2024 2156    MICROBIOLOGY: No results found for this or any previous visit (from the past 240 hours).  RADIOLOGY STUDIES/RESULTS: CT CHEST WO CONTRAST Result Date: 11/23/2024 EXAM: CT CHEST WITHOUT CONTRAST 11/23/2024 07:55:44 PM TECHNIQUE: CT of the chest  was performed without the administration of intravenous contrast. Multiplanar reformatted images are provided for review. Automated exposure control, iterative reconstruction, and/or weight based adjustment of the mA/kV was utilized to reduce the radiation dose to as low as reasonably achievable. COMPARISON: 04/15/2024 CLINICAL HISTORY: Rectal cancer, staging. 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 Cardiomegaly. Small pericardial effusion. Mild 3-vessel coronary atherosclerosis. Aorta: Suspected stable thoracic aortic dissection (images 23 and 61), poorly visualized on unenhanced CT. Associated stable thoracic aortic aneurysm at the level of the transverse aortic arch measuring 4.4 cm (image 19). Central airways: The central airways are clear. Vessels: Left PICC terminates in upper SVC. LYMPH NODES: No mediastinal, hilar or axillary lymphadenopathy. LUNGS AND PLEURA: Scattered calcified granulomata, benign. Two left upper lobe pulmonary nodules measuring up to 3 mm (images 50 to 51), unchanged, benign. Mild linear scarring/atelectasis in bilateral lower lobes. No pleural effusion or pneumothorax. SOFT TISSUES/BONES: Median sternotomy. No acute abnormality of the bones or soft tissues. UPPER ABDOMEN: Limited images of the upper abdomen demonstrate an atrophic left kidney. 2.1 cm right adrenal nodule compatible with benign adrenal adenoma. IMPRESSION: 1. No evidence of metastatic disease. Two stable 3 mm left upper lobe nodules, benign. 2. Status post aortic valve/root repair. Suspected stable thoracic aortic dissection with associated aneurysm, as above. 3. Cardiomegaly with a small pericardial effusion. Electronically signed by: Pinkie Pebbles MD 11/23/2024 09:44 PM EST RP Workstation: [id number]   US  EKG SITE RITE Result Date: 11/23/2024 If Site Rite image not attached, placement could not be confirmed due to current cardiac rhythm.  MR PELVIS WO CM RECTAL  CA STAGING Result Date: 11/23/2024 CLINICAL DATA:  New diagnosis of anal cancer. EXAM: MRI PELVIS WITHOUT CONTRAST TECHNIQUE: Multiplanar multisequence MR imaging of the pelvis was performed. No intravenous contrast was administered. Ultrasound gel was administered per rectum to optimize tumor evaluation. COMPARISON:  CTA of 11/21/2024 FINDINGS: TUMOR LOCATION Tumor distance from Anal Verge/Skin surface: Not applicable. Appears to protrude from the anus, including on image 32/5. Tumor distance to Internal Anal sphincter: Not applicable. TUMOR DESCRIPTION Circumferential extent: Circumferential, including on image 31/5. Tumor Size and volume: 5.3 by 2.7 x 2.8 cm on images 18/3 and 31/5. Volume 20.8 cc T - CATEGORY Based on lesion size and presumed anal primary, T3. Levator Ani Involvement: Present, especially eccentric right and posteriorly on image 31/5. N - CATEGORY Right inguinal adenopathy at 1.5 cm on image 15/5. This node is hyperenhancing on image 185/9 of the prior CTA. A right external iliac 9 mm node on image 8/5 is also hyperenhancing on the prior CTA. N1b. Other: No significant free fluid. A left ovarian  T2 hypointense 2.5 x 2.0 cm on image 13/5. No bowel obstruction. The patient halted the exam after the initial T2 weighted imaging was performed. IMPRESSION: Exam was halted at patient request after the initial T2 weighted images. Anal primary, staged as T3, N1b as detailed above. Involvement of the levator ani, as detailed above. 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 Consider nonemergent outpatient follow-up pre and postcontrast MRI at 3 months versus ultrasound surveillance. Electronically Signed   By: Rockey Kilts M.D.   On: 11/23/2024 13:57     LOS: 5 days   Zuleika Gallus CHRISTELLA Gail, MD  [facility]  To contact the attending provider between 7A-7P or the covering provider during after hours 7P-7A, please log into the web site [email or web address] and access using universal  [place] password for that web site. If you do not have the password, please call the hospital operator.  11/25/2024, 8:33 AM

## 2024-11-25 NOTE — Plan of Care (Signed)
°  Problem: Education: Goal: Knowledge of General Education information will improve Description: Including pain rating scale, medication(s)/side effects and non-pharmacologic comfort measures Outcome: Progressing   Problem: Health Behavior/Discharge Planning: Goal: Ability to manage health-related needs will improve Outcome: Progressing   Problem: Clinical Measurements: Goal: Ability to maintain clinical measurements within normal limits will improve Outcome: Progressing   Problem: Activity: Goal: Risk for activity intolerance will decrease Outcome: Progressing   Problem: Nutrition: Goal: Adequate nutrition will be maintained Outcome: Progressing   Problem: Coping: Goal: Level of anxiety will decrease Outcome: Progressing   Problem: Elimination: Goal: Will not experience complications related to bowel motility Outcome: Not Progressing   Problem: Pain Managment: Goal: General experience of comfort will improve and/or be controlled Outcome: Progressing   Problem: Safety: Goal: Ability to remain free from injury will improve Outcome: Progressing   Problem: Skin Integrity: Goal: Risk for impaired skin integrity will decrease Outcome: Progressing

## 2024-11-26 ENCOUNTER — Ambulatory Visit: Payer: Medicare (Managed Care) | Admitting: Radiation Oncology

## 2024-11-26 DIAGNOSIS — Z5111 Encounter for antineoplastic chemotherapy: Secondary | ICD-10-CM | POA: Insufficient documentation

## 2024-11-26 DIAGNOSIS — C20 Malignant neoplasm of rectum: Secondary | ICD-10-CM | POA: Insufficient documentation

## 2024-11-26 DIAGNOSIS — Z510 Encounter for antineoplastic radiation therapy: Secondary | ICD-10-CM | POA: Insufficient documentation

## 2024-11-26 DIAGNOSIS — N184 Chronic kidney disease, stage 4 (severe): Secondary | ICD-10-CM | POA: Insufficient documentation

## 2024-11-26 DIAGNOSIS — E1122 Type 2 diabetes mellitus with diabetic chronic kidney disease: Secondary | ICD-10-CM | POA: Insufficient documentation

## 2024-11-26 DIAGNOSIS — I129 Hypertensive chronic kidney disease with stage 1 through stage 4 chronic kidney disease, or unspecified chronic kidney disease: Secondary | ICD-10-CM | POA: Insufficient documentation

## 2024-11-26 LAB — CBC
HCT: 24.4 % — ABNORMAL LOW (ref 36.0–46.0)
HCT: 24 % — ABNORMAL LOW (ref 36.0–46.0)
Hemoglobin: 7.9 g/dL — ABNORMAL LOW (ref 12.0–15.0)
Hemoglobin: 7.5 g/dL — ABNORMAL LOW (ref 12.0–15.0)
MCH: 31 pg (ref 26.0–34.0)
MCH: 30 pg (ref 26.0–34.0)
MCHC: 32.4 g/dL (ref 30.0–36.0)
MCHC: 31.3 g/dL (ref 30.0–36.0)
MCV: 95.7 fL (ref 80.0–100.0)
MCV: 96 fL (ref 80.0–100.0)
Platelets: 128 K/uL — ABNORMAL LOW (ref 150–400)
Platelets: 130 K/uL — ABNORMAL LOW (ref 150–400)
RBC: 2.55 MIL/uL — ABNORMAL LOW (ref 3.87–5.11)
RBC: 2.5 MIL/uL — ABNORMAL LOW (ref 3.87–5.11)
RDW: 17.6 % — ABNORMAL HIGH (ref 11.5–15.5)
RDW: 17.4 % — ABNORMAL HIGH (ref 11.5–15.5)
WBC: 6.1 K/uL (ref 4.0–10.5)
WBC: 5.6 K/uL (ref 4.0–10.5)
nRBC: 0 % (ref 0.0–0.2)
nRBC: 0 % (ref 0.0–0.2)

## 2024-11-26 LAB — SURGICAL PATHOLOGY

## 2024-11-26 NOTE — Progress Notes (Signed)
CBC collected, sent to lab. 

## 2024-11-26 NOTE — Progress Notes (Signed)
 4 Days Post-Op   Subjective/Chief Complaint: No complaints   Objective: Vital signs in last 24 hours: Temp:  [98.2 F (36.8 C)-98.9 F (37.2 C)] 98.7 F (37.1 C) (12/15 0747) Pulse Rate:  [69-76] 72 (12/15 0747) Resp:  [16-18] 16 (12/15 0747) BP: (138-154)/(83-99) 154/99 (12/15 0747) SpO2:  [94 %-99 %] 99 % (12/15 0747) Last BM Date : 11/23/24  Intake/Output from previous day: 12/14 0701 - 12/15 0700 In: 277.4 [P.O.:240; I.V.:37.4] Out: -  Intake/Output this shift: No intake/output data recorded.  General appearance: alert and cooperative Resp: clear to auscultation bilaterally Cardio: regular rate and rhythm GI: soft, nontender  Lab Results:  Recent Labs    11/25/24 1800 11/26/24 0405  WBC 5.7 6.1  HGB 7.7* 7.9*  HCT 24.2* 24.4*  PLT 126* 128*   BMET Recent Labs    11/24/24 0439 11/25/24 0400  NA 141 145  K 3.7 4.1  CL 109 111  CO2 25 25  GLUCOSE 91 108*  BUN 30* 34*  CREATININE 3.75* 3.92*  CALCIUM  8.1* 8.2*   PT/INR No results for input(s): LABPROT, INR in the last 72 hours. ABG No results for input(s): PHART, HCO3 in the last 72 hours.  Invalid input(s): PCO2, PO2  Studies/Results: No results found.  Anti-infectives: Anti-infectives (From admission, onward)    None       Assessment/Plan: s/p Procedures: SIGMOIDOSCOPY, FLEXIBLE (N/A) Non obstructing rectal cancer Hg stable Plan for chemo and radiation per oncology Will need follow up as outpatient with our colorectal surgeons Will sign off  LOS: 6 days    Mary Stephens 11/26/2024

## 2024-11-26 NOTE — Progress Notes (Signed)
 PROGRESS NOTE        PATIENT DETAILS Name: Mary Stephens Age: 68 y.o. Sex: female Date of Birth: January 07, 1956 Admit Date: 11/20/2024 Admitting Physician Marsa KATHEE Scurry, MD ERE:Mzzi, Tiffany L, DO  Brief Summary: Patient is a 68 y.o.  female with history of CKD 4, HTN, HLD, type A Aortic dissection-s/p hemiarch repair 2021-presented with hematochezia with ABLA and subsequently admitted to the hospitalist service.  Evaluated by GI-underwent flex sig on 12/11-which showed anorectal mass-biopsy positive for adenocarcinoma.  Hospital course complicated by intermittent ongoing hematochezia-per GI-no endoscopic means to stop bleeding-recommendations are to initiate chemo/radiation-plans are to transfer to [facility] for concurrent chemo/radiation.  Transferred to [facility] 12/13 in anticipation of starting chemo and radiation this coming week.  Significant events: 12/9>> admit to [facility] 12/13>> transfer to [facility]  Significant studies: 12/10>> CTA GI bleed: No evidence of active GI bleed. 12/12>> MRI pelvis rectal CA staging: Anal primary stage is T3 N1b 12/12>> CT chest: No metastatic disease  Significant microbiology data: None  Procedures: 12/11>> flex sig: Mass extending from the dentate line and prolapsing out of the anus 12/12>> PICC line placed (note oncology discussed with primary nephrology-Dr. Tobie given CKD-okay to place in nondominant arm)  Consults: GI Radiation oncology Medical oncology General Surgery, they have signed off 12/15  Subjective: Seen and examined this morning she seems much more calm and relaxed. Tolerated her breakfast, says that she has very little rectal bleeding anymore, really just spotting when she wipes.  Objective: Vitals: Blood pressure (!) 154/99, pulse 72, temperature 98.7 F (37.1 C), temperature source Oral, resp. rate 16, height 5' 7.99 (1.727 m), weight 61.1 kg, SpO2 99%.   Exam: General:  Alert, oriented,  calm, in no acute distress much more relaxed this morning Cardiovascular: RRR, no murmurs or rubs, no peripheral edema  Respiratory: clear to auscultation bilaterally, no wheezes, no crackles  Abdomen: soft, nontender, nondistended, normal bowel tones heard  Skin: dry, no rashes  Musculoskeletal: no joint effusions, normal range of motion  Psychiatric: appropriate affect, normal speech Neurologic: extraocular muscles intact, clear speech, moving all extremities with intact sensorium  Pertinent Labs/Radiology:    Latest Ref Rng & Units 11/26/2024    4:05 AM 11/25/2024    6:00 PM 11/25/2024    4:00 AM  CBC  WBC 4.0 - 10.5 K/uL 6.1  5.7  5.5   Hemoglobin 12.0 - 15.0 g/dL 7.9  7.7  7.7   Hematocrit 36.0 - 46.0 % 24.4  24.2  23.3   Platelets 150 - 400 K/uL 128  126  111     Lab Results  Component Value Date   NA 145 11/25/2024   K 4.1 11/25/2024   CL 111 11/25/2024   CO2 25 11/25/2024      Assessment/Plan: Lower GI bleeding acute blood loss anemia secondary to anorectal mass-biopsy suggestive of rectal adenocarcinoma  No more frank hematochezia and blood clots, states that she just has a little bit of pink blood when wiping Hb downtrending, thus far has been transfused 4 units PRBC and hemoglobin is stable today Anticipate chemo/radiation-this will start this coming week GI/radiation oncology/medical oncology following. As needed narcotics for anal pain, also on stool softeners to minimize symptoms  CKD 4 At baseline Follow electrolytes Please note-oncologist-Dr. Lanny discussed with Dr. Tobie on 12/12 (patient's primary nephrologist)-subsequently PICC line placed on  nondominant arm for initiation of chemotherapy this week  HTN BP stable Allow some amount of hypertension due to ongoing GI bleeding Cautiously continue with amlodipine  and half of home dosing of metoprolol  Continue to hold hydralazine  Follow/optimize.    HLD Statin  Chronic HCV Completed treatment with  Harvoni  per prior notes  Type A Aortic dissection-s/p hemiarch repair 2021  DM-2 (A1c 4.5 on 5/20) Diet controlled  Code status:   Code Status: Full Code   DVT Prophylaxis: SCDs Start: 11/20/24 1747  Family Communication:  None at bedside-patient has been updating her daughter herself, all questions answered  Disposition Plan: Status is: Inpatient Remains inpatient appropriate because: Severity of illness and plan for inpatient chemotherapy.   Planned Discharge Destination:Home  Diet: Diet Order             Diet regular Room service appropriate? Yes; Fluid consistency: Thin  Diet effective now                   Antimicrobial agents: Anti-infectives (From admission, onward)    None      MEDICATIONS: Scheduled Meds:  amLODipine   10 mg Oral Daily   atorvastatin   40 mg Oral QHS   Chlorhexidine  Gluconate Cloth  6 each Topical Daily   metoprolol  tartrate  25 mg Oral BID   senna-docusate  1 tablet Oral BID   sodium chloride  flush  10-40 mL Intracatheter Q12H   sodium chloride  flush  3 mL Intravenous Q12H   Continuous Infusions:  sodium chloride  100 mL/hr at 11/26/24 0410   PRN Meds:.acetaminophen  **OR** acetaminophen , HYDROmorphone  (DILAUDID ) injection, lidocaine , LORazepam , LORazepam , oxyCODONE , polyethylene glycol, sodium chloride  flush  I have personally reviewed following labs and imaging studies  LABORATORY DATA: CBC: Recent Labs  Lab 11/24/24 0439 11/24/24 1000 11/25/24 0400 11/25/24 1800 11/26/24 0405  WBC 5.0 8.3 5.5 5.7 6.1  HGB 7.9* 7.5* 7.7* 7.7* 7.9*  HCT 23.6* 23.0* 23.3* 24.2* 24.4*  MCV 91.1 94.3 94.0 95.3 95.7  PLT 115* 124* 111* 126* 128*    Basic Metabolic Panel: Recent Labs  Lab 11/20/24 1417 11/21/24 0316 11/22/24 0333 11/24/24 0439 11/25/24 0400  NA 142 140 141 141 145  K 3.9 3.4* 3.2* 3.7 4.1  CL 113* 111 110 109 111  CO2 21* 22 24 25 25   GLUCOSE 107* 84 80 91 108*  BUN 36* 35* 29* 30* 34*  CREATININE 4.44* 4.52*  3.46* 3.75* 3.92*  CALCIUM  9.0 8.3* 8.2* 8.1* 8.2*   GFR: Estimated Creatinine Clearance: 12.9 mL/min (A) (by C-G formula based on SCr of 3.92 mg/dL (H)).  Liver Function Tests: Recent Labs  Lab 11/20/24 1417 11/21/24 0316  AST 15 12*  ALT 7 7  ALKPHOS 61 52  BILITOT 0.7 0.8  PROT 5.9* 5.5*  ALBUMIN  3.3* 3.0*   No results for input(s): LIPASE, AMYLASE in the last 168 hours. No results for input(s): AMMONIA in the last 168 hours.  Coagulation Profile: No results for input(s): INR, PROTIME in the last 168 hours.  Cardiac Enzymes: No results for input(s): CKTOTAL, CKMB, CKMBINDEX, TROPONINI in the last 168 hours.  BNP (last 3 results) No results for input(s): PROBNP in the last 8760 hours.  Lipid Profile: No results for input(s): CHOL, HDL, LDLCALC, TRIG, CHOLHDL, LDLDIRECT in the last 72 hours.  Thyroid  Function Tests: No results for input(s): TSH, T4TOTAL, FREET4, T3FREE, THYROIDAB in the last 72 hours.  Anemia Panel: Recent Labs    11/23/24 1153  VITAMINB12 469  FOLATE >20.0  FERRITIN  16  TIBC 298  IRON  37    Urine analysis:    Component Value Date/Time   COLORURINE YELLOW 05/25/2024 1705   APPEARANCEUR CLOUDY (A) 05/25/2024 1705   LABSPEC 1.010 05/25/2024 1705   PHURINE 5.0 05/25/2024 1705   GLUCOSEU NEGATIVE 05/25/2024 1705   HGBUR NEGATIVE 05/25/2024 1705   BILIRUBINUR NEGATIVE 05/25/2024 1705   KETONESUR NEGATIVE 05/25/2024 1705   PROTEINUR 30 (A) 05/25/2024 1705   NITRITE NEGATIVE 05/25/2024 1705   LEUKOCYTESUR TRACE (A) 05/25/2024 1705    Sepsis Labs: Lactic Acid, Venous    Component Value Date/Time   LATICACIDVEN 0.4 (L) 04/30/2024 2156   MICROBIOLOGY: No results found for this or any previous visit (from the past 240 hours).  RADIOLOGY STUDIES/RESULTS: No results found.   LOS: 6 days   Jaanvi Fizer CHRISTELLA Gail, MD  [facility]  To contact the attending provider between 7A-7P or the  covering provider during after hours 7P-7A, please log into the web site [email or web address] and access using universal [place] password for that web site. If you do not have the password, please call the hospital operator.  11/26/2024, 10:54 AM

## 2024-11-27 ENCOUNTER — Other Ambulatory Visit: Payer: Self-pay

## 2024-11-27 ENCOUNTER — Ambulatory Visit: Payer: Medicare (Managed Care) | Admitting: Radiation Oncology

## 2024-11-27 DIAGNOSIS — K922 Gastrointestinal hemorrhage, unspecified: Secondary | ICD-10-CM | POA: Diagnosis not present

## 2024-11-27 LAB — RAD ONC ARIA SESSION SUMMARY
Course Elapsed Days: 0
Plan Fractions Treated to Date: 1
Plan Prescribed Dose Per Fraction: 1.8 Gy
Plan Total Fractions Prescribed: 25
Plan Total Prescribed Dose: 45 Gy
Reference Point Dosage Given to Date: 1.8 Gy
Reference Point Session Dosage Given: 1.8 Gy
Session Number: 1

## 2024-11-27 LAB — CBC
HCT: 23.7 % — ABNORMAL LOW (ref 36.0–46.0)
HCT: 24.7 % — ABNORMAL LOW (ref 36.0–46.0)
Hemoglobin: 7.5 g/dL — ABNORMAL LOW (ref 12.0–15.0)
Hemoglobin: 7.8 g/dL — ABNORMAL LOW (ref 12.0–15.0)
MCH: 30.9 pg (ref 26.0–34.0)
MCH: 30.8 pg (ref 26.0–34.0)
MCHC: 31.6 g/dL (ref 30.0–36.0)
MCHC: 31.6 g/dL (ref 30.0–36.0)
MCV: 97.5 fL (ref 80.0–100.0)
MCV: 97.6 fL (ref 80.0–100.0)
Platelets: 138 K/uL — ABNORMAL LOW (ref 150–400)
Platelets: 147 K/uL — ABNORMAL LOW (ref 150–400)
RBC: 2.43 MIL/uL — ABNORMAL LOW (ref 3.87–5.11)
RBC: 2.53 MIL/uL — ABNORMAL LOW (ref 3.87–5.11)
RDW: 17.4 % — ABNORMAL HIGH (ref 11.5–15.5)
RDW: 17.2 % — ABNORMAL HIGH (ref 11.5–15.5)
WBC: 5.6 K/uL (ref 4.0–10.5)
WBC: 5.9 K/uL (ref 4.0–10.5)
nRBC: 0 % (ref 0.0–0.2)
nRBC: 0 % (ref 0.0–0.2)

## 2024-11-27 MED ORDER — COLD PACK MISC ONCOLOGY: 1.0000 | Freq: Once | Status: AC | PRN

## 2024-11-27 MED ORDER — ONDANSETRON HCL 4 MG/2ML IJ SOLN
4.0000 mg | Freq: Four times a day (QID) | INTRAMUSCULAR | Status: AC | PRN
  Administered 2024-11-27 – 2024-11-29 (×2): 4 mg via INTRAVENOUS
  Filled 2024-11-27 (×2): qty 2

## 2024-11-27 MED ORDER — SODIUM CHLORIDE 0.9 % IV SOLN
225.0000 mg/m2/d | INTRAVENOUS | Status: AC
  Administered 2024-11-27: 10:00:00 1500 mg via INTRAVENOUS
  Filled 2024-11-27: qty 30

## 2024-11-27 NOTE — Progress Notes (Signed)
 PROGRESS NOTE        PATIENT DETAILS Name: Mary Stephens Age: 68 y.o. Sex: female Date of Birth: [birth year] Admit Date: 11/20/2024 Admitting Physician Marsa KATHEE Scurry, MD ERE:Mzzi, Tiffany L, DO  Brief Summary: Patient is a 68 y.o.  female with history of CKD 4, HTN, HLD, type A Aortic dissection-s/p hemiarch repair 2021-presented with hematochezia with ABLA and subsequently admitted to the hospitalist service.  Evaluated by GI-underwent flex sig on 12/11-which showed anorectal mass-biopsy positive for adenocarcinoma.  Hospital course complicated by intermittent ongoing hematochezia-per GI-no endoscopic means to stop bleeding-recommendations are to initiate chemo/radiation-plans are to transfer to [facility] for concurrent chemo/radiation.  Transferred to [facility] 12/13 started today 12/16.  Significant events: 12/9>> admit to [facility] 12/13>> transfer to [facility]  Significant studies: 12/10>> CTA GI bleed: No evidence of active GI bleed. 12/12>> MRI pelvis rectal CA staging: Anal primary stage is T3 N1b 12/12>> CT chest: No metastatic disease  Significant microbiology data: None  Procedures: 12/11>> flex sig: Mass extending from the dentate line and prolapsing out of the anus 12/12>> PICC line placed (note oncology discussed with primary nephrology-Dr. Tobie given CKD-okay to place in nondominant arm)  Consults: GI Radiation oncology Medical oncology General Surgery, they have signed off 12/15  Subjective: Seen and examined this morning she seems much more calm and relaxed but having some mild rectal pain. Currently receiving chemotherapy.  Objective: Vitals: Blood pressure (!) 165/103, pulse 70, temperature 98.4 F (36.9 C), temperature source Oral, resp. rate 18, height 5' 7.99 (1.727 m), weight 61.1 kg, SpO2 99%.   Exam: General:  Alert, oriented, calm, in no acute distress much more relaxed this morning Cardiovascular: RRR, no murmurs or  rubs, no peripheral edema  Respiratory: clear to auscultation bilaterally, no wheezes, no crackles  Abdomen: soft, nontender, nondistended, normal bowel tones heard  Skin: dry, no rashes  Musculoskeletal: no joint effusions, normal range of motion  Psychiatric: appropriate affect, normal speech Neurologic: extraocular muscles intact, clear speech, moving all extremities with intact sensorium  Pertinent Labs/Radiology:    Latest Ref Rng & Units 11/27/2024    4:22 AM 11/26/2024    5:45 PM 11/26/2024    4:05 AM  CBC  WBC 4.0 - 10.5 K/uL 5.6  5.6  6.1   Hemoglobin 12.0 - 15.0 g/dL 7.5  7.5  7.9   Hematocrit 36.0 - 46.0 % 23.7  24.0  24.4   Platelets 150 - 400 K/uL 138  130  128     Lab Results  Component Value Date   NA 145 11/25/2024   K 4.1 11/25/2024   CL 111 11/25/2024   CO2 25 11/25/2024      Assessment/Plan: Lower GI bleeding acute blood loss anemia secondary to anorectal mass-biopsy suggestive of rectal adenocarcinoma  No more frank hematochezia and blood clots, states that she just has a little bit of pink blood when wiping Hb downtrending, thus far has been transfused 4 units PRBC and hemoglobin is stable today Seen by Dr. Lanny this morning, started chemotherapy. GI/radiation oncology/medical oncology following. As needed narcotics for anal pain, also on stool softeners to minimize symptoms  CKD 4 At baseline Follow electrolytes Please note-oncologist-Dr. Lanny discussed with Dr. Tobie on 12/12 (patient's primary nephrologist)-subsequently PICC line placed on nondominant arm for initiation of chemotherapy this week  HTN BP stable Allow some amount  of hypertension due to ongoing GI bleeding Cautiously continue with amlodipine  and half of home dosing of metoprolol  Continue to hold hydralazine  Follow/optimize.    HLD Statin  Chronic HCV Completed treatment with Harvoni  per prior notes  Type A Aortic dissection-s/p hemiarch repair 2021  DM-2 (A1c 4.5 on  5/20) Diet controlled  Code status:   Code Status: Full Code   DVT Prophylaxis: SCDs Start: 11/20/24 1747  Family Communication:  None at bedside-patient has been updating her daughter herself, all questions answered  Disposition Plan: Status is: Inpatient Remains inpatient appropriate because: Severity of illness and plan for inpatient chemotherapy.   Planned Discharge Destination:Home likely Saturday after completing chemotherapy  Diet: Diet Order             Diet regular Room service appropriate? Yes; Fluid consistency: Thin  Diet effective now                   Antimicrobial agents: Anti-infectives (From admission, onward)    None      MEDICATIONS: Scheduled Meds:  amLODipine   10 mg Oral Daily   atorvastatin   40 mg Oral QHS   Chlorhexidine  Gluconate Cloth  6 each Topical Daily   fluorouracil   225 mg/m2/day (Treatment Plan Recorded) Intravenous 4 days   metoprolol  tartrate  25 mg Oral BID   senna-docusate  1 tablet Oral BID   sodium chloride  flush  10-40 mL Intracatheter Q12H   sodium chloride  flush  3 mL Intravenous Q12H   Continuous Infusions:   PRN Meds:.acetaminophen  **OR** acetaminophen , Cold Pack, HYDROmorphone  (DILAUDID ) injection, lidocaine , LORazepam , LORazepam , oxyCODONE , polyethylene glycol, sodium chloride  flush  I have personally reviewed following labs and imaging studies  LABORATORY DATA: CBC: Recent Labs  Lab 11/25/24 0400 11/25/24 1800 11/26/24 0405 11/26/24 1745 11/27/24 0422  WBC 5.5 5.7 6.1 5.6 5.6  HGB 7.7* 7.7* 7.9* 7.5* 7.5*  HCT 23.3* 24.2* 24.4* 24.0* 23.7*  MCV 94.0 95.3 95.7 96.0 97.5  PLT 111* 126* 128* 130* 138*    Basic Metabolic Panel: Recent Labs  Lab 11/20/24 1417 11/21/24 0316 11/22/24 0333 11/24/24 0439 11/25/24 0400  NA 142 140 141 141 145  K 3.9 3.4* 3.2* 3.7 4.1  CL 113* 111 110 109 111  CO2 21* 22 24 25 25   GLUCOSE 107* 84 80 91 108*  BUN 36* 35* 29* 30* 34*  CREATININE 4.44* 4.52* 3.46*  3.75* 3.92*  CALCIUM  9.0 8.3* 8.2* 8.1* 8.2*   GFR: Estimated Creatinine Clearance: 12.9 mL/min (A) (by C-G formula based on SCr of 3.92 mg/dL (H)).  Liver Function Tests: Recent Labs  Lab 11/20/24 1417 11/21/24 0316  AST 15 12*  ALT 7 7  ALKPHOS 61 52  BILITOT 0.7 0.8  PROT 5.9* 5.5*  ALBUMIN  3.3* 3.0*   No results for input(s): LIPASE, AMYLASE in the last 168 hours. No results for input(s): AMMONIA in the last 168 hours.  Coagulation Profile: No results for input(s): INR, PROTIME in the last 168 hours.  Cardiac Enzymes: No results for input(s): CKTOTAL, CKMB, CKMBINDEX, TROPONINI in the last 168 hours.  BNP (last 3 results) No results for input(s): PROBNP in the last 8760 hours.  Lipid Profile: No results for input(s): CHOL, HDL, LDLCALC, TRIG, CHOLHDL, LDLDIRECT in the last 72 hours.  Thyroid  Function Tests: No results for input(s): TSH, T4TOTAL, FREET4, T3FREE, THYROIDAB in the last 72 hours.  Anemia Panel: No results for input(s): VITAMINB12, FOLATE, FERRITIN, TIBC, IRON , RETICCTPCT in the last 72 hours.   Urine analysis:  Component Value Date/Time   COLORURINE YELLOW 05/25/2024 1705   APPEARANCEUR CLOUDY (A) 05/25/2024 1705   LABSPEC 1.010 05/25/2024 1705   PHURINE 5.0 05/25/2024 1705   GLUCOSEU NEGATIVE 05/25/2024 1705   HGBUR NEGATIVE 05/25/2024 1705   BILIRUBINUR NEGATIVE 05/25/2024 1705   KETONESUR NEGATIVE 05/25/2024 1705   PROTEINUR 30 (A) 05/25/2024 1705   NITRITE NEGATIVE 05/25/2024 1705   LEUKOCYTESUR TRACE (A) 05/25/2024 1705    Sepsis Labs: Lactic Acid, Venous    Component Value Date/Time   LATICACIDVEN 0.4 (L) 04/30/2024 2156   MICROBIOLOGY: No results found for this or any previous visit (from the past 240 hours).  RADIOLOGY STUDIES/RESULTS: No results found.   LOS: 7 days   Mary Adamczak CHRISTELLA Gail, MD  [facility]  To contact the attending provider between 7A-7P or the  covering provider during after hours 7P-7A, please log into the web site [email or web address] and access using universal [place] password for that web site. If you do not have the password, please call the hospital operator.  11/27/2024, 1:50 PM

## 2024-11-27 NOTE — Progress Notes (Signed)
 Mary Stephens   DOB:07/26/1956   FM#:[id number]   RDW#:[id number]  Hem/onc follow up note   Subjective: Patient was transferred to [facility] over the weekend, to prepare chemo and radiation.  Her hemoglobin has overall stable, and she has not required further blood transfusion over the weekend.   Objective:  Vitals:   11/26/24 2115 11/27/24 0447  BP: (!) 143/100 (!) 165/103  Pulse: 83 70  Resp: 18 18  Temp: 99.3 F (37.4 C) 98.4 F (36.9 C)  SpO2: 100% 99%    Body mass index is 20.49 kg/m.  Intake/Output Summary (Last 24 hours) at 11/27/2024 0823 Last data filed at 11/26/2024 1800 Gross per 24 hour  Intake 2126.54 ml  Output --  Net 2126.54 ml     Sclerae unicteric  Oropharynx clear  No peripheral adenopathy  Lungs clear -- no rales or rhonchi  Heart regular rate and rhythm  Abdomen benign  MSK no focal spinal tenderness, no peripheral edema  Neuro nonfocal    CBG (last 3)  No results for input(s): GLUCAP in the last 72 hours.   Labs:  Lab Results  Component Value Date   WBC 5.6 11/27/2024   HGB 7.5 (L) 11/27/2024   HCT 23.7 (L) 11/27/2024   MCV 97.5 11/27/2024   PLT 138 (L) 11/27/2024   NEUTROABS 4.3 04/15/2024     Urine Studies No results for input(s): UHGB, CRYS in the last 72 hours.  Invalid input(s): UACOL, UAPR, USPG, UPH, UTP, UGL, UKET, UBIL, UNIT, UROB, [place], UEPI, UWBC, CORINN JERROL BURNS [place], [place]  Basic Metabolic Panel: Recent Labs  Lab 11/20/24 1417 11/21/24 0316 11/22/24 0333 11/24/24 0439 11/25/24 0400  NA 142 140 141 141 145  K 3.9 3.4* 3.2* 3.7 4.1  CL 113* 111 110 109 111  CO2 21* 22 24 25 25   GLUCOSE 107* 84 80 91 108*  BUN 36* 35* 29* 30* 34*  CREATININE 4.44* 4.52* 3.46* 3.75* 3.92*  CALCIUM  9.0 8.3* 8.2* 8.1* 8.2*   GFR Estimated Creatinine Clearance: 12.9 mL/min (A) (by C-G formula based on SCr of 3.92 mg/dL (H)). Liver Function Tests: Recent Labs  Lab 11/20/24 1417 11/21/24 0316   AST 15 12*  ALT 7 7  ALKPHOS 61 52  BILITOT 0.7 0.8  PROT 5.9* 5.5*  ALBUMIN  3.3* 3.0*   No results for input(s): LIPASE, AMYLASE in the last 168 hours. No results for input(s): AMMONIA in the last 168 hours. Coagulation profile No results for input(s): INR, PROTIME in the last 168 hours.  CBC: Recent Labs  Lab 11/25/24 0400 11/25/24 1800 11/26/24 0405 11/26/24 1745 11/27/24 0422  WBC 5.5 5.7 6.1 5.6 5.6  HGB 7.7* 7.7* 7.9* 7.5* 7.5*  HCT 23.3* 24.2* 24.4* 24.0* 23.7*  MCV 94.0 95.3 95.7 96.0 97.5  PLT 111* 126* 128* 130* 138*   Cardiac Enzymes: No results for input(s): CKTOTAL, CKMB, CKMBINDEX, TROPONINI in the last 168 hours. BNP: Invalid input(s): POCBNP CBG: Recent Labs  Lab 11/22/24 0852  GLUCAP 93   D-Dimer No results for input(s): DDIMER in the last 72 hours. Hgb A1c No results for input(s): HGBA1C in the last 72 hours. Lipid Profile No results for input(s): CHOL, HDL, LDLCALC, TRIG, CHOLHDL, LDLDIRECT in the last 72 hours. Thyroid  function studies No results for input(s): TSH, T4TOTAL, T3FREE, THYROIDAB in the last 72 hours.  Invalid input(s): FREET3 Anemia work up No results for input(s): VITAMINB12, FOLATE, FERRITIN, TIBC, IRON , RETICCTPCT in the last 72 hours. Microbiology No results found for  this or any previous visit (from the past 240 hours).    Studies:  No results found.  Assessment: 68 y.o. female   Newly diagnosed low rectal stage III adenocarcinoma Severe anemia secondary to GI bleeding from tumor Iron  deficiency CKD stage IV Hypertension Chronic HCV, treated Diet controlled diabetes Diastolic congestive heart failure, normal EF on echo in 04/2024    Plan:  - I reviewed her CT and MRI images, discussed her staging and overall prognosis. - I recommend concurrent chemoradiation with 5-FU infusion due to her CKD.  Plan to start tomorrow, I will get approval for inpatient  chemo, and coordinate with inpatient chemo team. - If she tolerates chemoradiation well, plan to start chemo FOLFOX 2 to 4 weeks after she completes chemoradiation. - I discussed the above in detail with the patient, and she agrees to proceed.  --Chemotherapy consent: Side effects including but does not not limited to, fatigue, nausea, vomiting, diarrhea, hair loss, neuropathy, fluid retention, renal and kidney dysfunction, neutropenic fever, needed for blood transfusion, bleeding, were discussed with patient in great detail. She agrees to proceed. -The goal of chemotherapy is curative - I spent a total of 50 minutes for her visit today.  This note was entered the day after my visit.   Onita Mattock, MD 11/26/2025 8:23 AM

## 2024-11-27 NOTE — Progress Notes (Addendum)
 Ok to treat without recent CBC w diff. MD to get CBC w diff tomorrow.  Ok to proceed with Scr 3.92 from 11/25/24, Hg = 7.5 and elevated DBP. XRT to be given Tu - Fri. 5FU order adjusted to 225 mg/m2/day over 96 hours per MD request. 5FU infusion to end early Sat.  Bridgett Leach [place], [place], BCPS, BCOP 11/27/2024 8:05 AM

## 2024-11-27 NOTE — Plan of Care (Signed)

## 2024-11-27 NOTE — Progress Notes (Signed)
 Mary Stephens   DOB:1956/01/24   FM#:[id number]   RDW#:[id number]  Hem/onc follow up note   Subjective: Patient slept well yesterday, had 2 bowel meant yesterday, denies significant hematochezia.  No other new complaints.   Objective:  Vitals:   11/26/24 2115 11/27/24 0447  BP: (!) 143/100 (!) 165/103  Pulse: 83 70  Resp: 18 18  Temp: 99.3 F (37.4 C) 98.4 F (36.9 C)  SpO2: 100% 99%    Body mass index is 20.49 kg/m.  Intake/Output Summary (Last 24 hours) at 11/27/2024 0816 Last data filed at 11/26/2024 1800 Gross per 24 hour  Intake 2126.54 ml  Output --  Net 2126.54 ml     Sclerae unicteric  Oropharynx clear  No peripheral adenopathy  Lungs clear -- no rales or rhonchi  Heart regular rate and rhythm  Abdomen benign  MSK no focal spinal tenderness, no peripheral edema  Neuro nonfocal    CBG (last 3)  No results for input(s): GLUCAP in the last 72 hours.   Labs:  Lab Results  Component Value Date   WBC 5.6 11/27/2024   HGB 7.5 (L) 11/27/2024   HCT 23.7 (L) 11/27/2024   MCV 97.5 11/27/2024   PLT 138 (L) 11/27/2024   NEUTROABS 4.3 04/15/2024    @LASTCHEMISTRY @  Urine Studies No results for input(s): UHGB, CRYS in the last 72 hours.  Invalid input(s): UACOL, UAPR, USPG, UPH, UTP, UGL, UKET, UBIL, UNIT, UROB, [place], UEPI, UWBC, CORINN JERROL BURNS [place], [place]  Basic Metabolic Panel: Recent Labs  Lab 11/20/24 1417 11/21/24 0316 11/22/24 0333 11/24/24 0439 11/25/24 0400  NA 142 140 141 141 145  K 3.9 3.4* 3.2* 3.7 4.1  CL 113* 111 110 109 111  CO2 21* 22 24 25 25   GLUCOSE 107* 84 80 91 108*  BUN 36* 35* 29* 30* 34*  CREATININE 4.44* 4.52* 3.46* 3.75* 3.92*  CALCIUM  9.0 8.3* 8.2* 8.1* 8.2*   GFR Estimated Creatinine Clearance: 12.9 mL/min (A) (by C-G formula based on SCr of 3.92 mg/dL (H)). Liver Function Tests: Recent Labs  Lab 11/20/24 1417 11/21/24 0316  AST 15 12*  ALT 7 7  ALKPHOS 61 52  BILITOT  0.7 0.8  PROT 5.9* 5.5*  ALBUMIN  3.3* 3.0*   No results for input(s): LIPASE, AMYLASE in the last 168 hours. No results for input(s): AMMONIA in the last 168 hours. Coagulation profile No results for input(s): INR, PROTIME in the last 168 hours.  CBC: Recent Labs  Lab 11/25/24 0400 11/25/24 1800 11/26/24 0405 11/26/24 1745 11/27/24 0422  WBC 5.5 5.7 6.1 5.6 5.6  HGB 7.7* 7.7* 7.9* 7.5* 7.5*  HCT 23.3* 24.2* 24.4* 24.0* 23.7*  MCV 94.0 95.3 95.7 96.0 97.5  PLT 111* 126* 128* 130* 138*   Cardiac Enzymes: No results for input(s): CKTOTAL, CKMB, CKMBINDEX, TROPONINI in the last 168 hours. BNP: Invalid input(s): POCBNP CBG: Recent Labs  Lab 11/22/24 0852  GLUCAP 93   D-Dimer No results for input(s): DDIMER in the last 72 hours. Hgb A1c No results for input(s): HGBA1C in the last 72 hours. Lipid Profile No results for input(s): CHOL, HDL, LDLCALC, TRIG, CHOLHDL, LDLDIRECT in the last 72 hours. Thyroid  function studies No results for input(s): TSH, T4TOTAL, T3FREE, THYROIDAB in the last 72 hours.  Invalid input(s): FREET3 Anemia work up No results for input(s): VITAMINB12, FOLATE, FERRITIN, TIBC, IRON , RETICCTPCT in the last 72 hours. Microbiology No results found for this or any previous visit (from the past 240 hours).  Studies:  No results found.  Assessment: 68 y.o. female   Newly diagnosed low rectal stage III adenocarcinoma Severe anemia secondary to GI bleeding from tumor Iron  deficiency CKD stage IV Hypertension Chronic HCV, treated Diet controlled diabetes Diastolic congestive heart failure, normal EF on echo in 04/2024    Plan:  - Plan to start concurrent chemoradiation with continuous 5-FU infusion today, lab reviewed, adequate for treatment. - I spoke with chemo nurse Bernardino, and pharmacist, and communicated with her floor nurse this morning. -Will repeat a CBC and CMP tomorrow morning,  consider blood transfusion if hemoglobin less than 7.5 -Anticipate discharge this Saturday morning after she completes chemotherapy. - We will follow-up closely  Onita Mattock, MD 11/27/2024  8:16 AM

## 2024-11-27 NOTE — Progress Notes (Signed)
 Chemo RN presented to patient room. Patient verified using two separate identifiers. Role of Chemo RN explained and basics of chemotherapy reviewed with patient. Gave patient an information sheet on 5FU. Consent obtained for administration of 5FU and placed in patient's chart. PICC checked for blood return and flushed. 5FU infusion started. Reviewed chemo precautions with patient. Patient verbalized understanding. Primary RN and MD made aware treatment initiated.

## 2024-11-28 ENCOUNTER — Encounter (HOSPITAL_COMMUNITY): Payer: Self-pay | Admitting: Pharmacist

## 2024-11-28 ENCOUNTER — Ambulatory Visit: Payer: Medicare (Managed Care)

## 2024-11-28 ENCOUNTER — Other Ambulatory Visit: Payer: Self-pay

## 2024-11-28 DIAGNOSIS — K922 Gastrointestinal hemorrhage, unspecified: Secondary | ICD-10-CM | POA: Diagnosis not present

## 2024-11-28 LAB — CBC
HCT: 25.3 % — ABNORMAL LOW (ref 36.0–46.0)
Hemoglobin: 8 g/dL — ABNORMAL LOW (ref 12.0–15.0)
MCH: 30.7 pg (ref 26.0–34.0)
MCHC: 31.6 g/dL (ref 30.0–36.0)
MCV: 96.9 fL (ref 80.0–100.0)
Platelets: 161 K/uL (ref 150–400)
RBC: 2.61 MIL/uL — ABNORMAL LOW (ref 3.87–5.11)
RDW: 17.2 % — ABNORMAL HIGH (ref 11.5–15.5)
WBC: 5.2 K/uL (ref 4.0–10.5)
nRBC: 0 % (ref 0.0–0.2)

## 2024-11-28 LAB — RAD ONC ARIA SESSION SUMMARY
Course Elapsed Days: 1
Plan Fractions Treated to Date: 2
Plan Prescribed Dose Per Fraction: 1.8 Gy
Plan Total Fractions Prescribed: 25
Plan Total Prescribed Dose: 45 Gy
Reference Point Dosage Given to Date: 3.6 Gy
Reference Point Session Dosage Given: 1.8 Gy
Session Number: 2

## 2024-11-28 LAB — COMPREHENSIVE METABOLIC PANEL WITH GFR
ALT: 6 U/L (ref 0–44)
AST: 15 U/L (ref 15–41)
Albumin: 3.7 g/dL (ref 3.5–5.0)
Alkaline Phosphatase: 57 U/L (ref 38–126)
Anion gap: 9 (ref 5–15)
BUN: 26 mg/dL — ABNORMAL HIGH (ref 8–23)
CO2: 25 mmol/L (ref 22–32)
Calcium: 8.7 mg/dL — ABNORMAL LOW (ref 8.9–10.3)
Chloride: 113 mmol/L — ABNORMAL HIGH (ref 98–111)
Creatinine, Ser: 3.25 mg/dL — ABNORMAL HIGH (ref 0.44–1.00)
GFR, Estimated: 15 mL/min — ABNORMAL LOW (ref >60)
Glucose, Bld: 81 mg/dL (ref 70–99)
Potassium: 3.8 mmol/L (ref 3.5–5.1)
Sodium: 147 mmol/L — ABNORMAL HIGH (ref 135–145)
Total Bilirubin: 0.4 mg/dL (ref 0.0–1.2)
Total Protein: 5.6 g/dL — ABNORMAL LOW (ref 6.5–8.1)

## 2024-11-28 LAB — DIFFERENTIAL
Abs Immature Granulocytes: 0.02 K/uL (ref 0.00–0.07)
Basophils Absolute: 0 K/uL (ref 0.0–0.1)
Basophils Relative: 0 %
Eosinophils Absolute: 0.1 K/uL (ref 0.0–0.5)
Eosinophils Relative: 2 %
Immature Granulocytes: 0 %
Lymphocytes Relative: 20 %
Lymphs Abs: 1 K/uL (ref 0.7–4.0)
Monocytes Absolute: 0.5 K/uL (ref 0.1–1.0)
Monocytes Relative: 9 %
Neutro Abs: 3.6 K/uL (ref 1.7–7.7)
Neutrophils Relative %: 69 %

## 2024-11-28 MED ORDER — METOPROLOL TARTRATE 50 MG PO TABS
50.0000 mg | ORAL_TABLET | Freq: Two times a day (BID) | ORAL | Status: DC
  Administered 2024-11-28 – 2024-12-01 (×6): 50 mg via ORAL
  Filled 2024-11-28 (×6): qty 1

## 2024-11-28 MED ORDER — HYDRALAZINE HCL 50 MG PO TABS
100.0000 mg | ORAL_TABLET | Freq: Three times a day (TID) | ORAL | Status: DC
  Administered 2024-11-28 – 2024-12-01 (×8): 100 mg via ORAL
  Filled 2024-11-28 (×8): qty 2

## 2024-11-28 NOTE — Assessment & Plan Note (Addendum)
 Presented with acute rectal bleeding No more frank hematochezia and blood clots, states that she just has a little bit of pink blood when wiping Hb downtrending from ABLA, thus far has been transfused 4 units PRBC; transfuse for Hgb <7 Flex sig showed moderately differentiated adenocarcinoma CEA 26.2 No role for surgery at this time GI/radiation oncology/medical oncology following As needed narcotics for anal pain, also on stool softeners to minimize symptoms Started on chemotherapy, will complete on Saturday and can dc at that time Also on concurrent radiation

## 2024-11-28 NOTE — Hospital Course (Signed)
 68yo with h/o stage 4 CKD, HTN, HLD, and aortic dissection s/p repair in 2021 who presented on 12/9 with rectal bleeding.  Flex sig on 12/11 showed anorectal mass that is biopsy positive for adenocarcinoma.  She was transferred to [facility] for initiation of concurrent chemo/radiation which was started on 12/16.

## 2024-11-28 NOTE — Plan of Care (Signed)
  Problem: Coping: Goal: Level of anxiety will decrease Outcome: Progressing   Problem: Pain Managment: Goal: General experience of comfort will improve and/or be controlled Outcome: Progressing   Problem: Safety: Goal: Ability to remain free from injury will improve Outcome: Progressing   Problem: Skin Integrity: Goal: Risk for impaired skin integrity will decrease Outcome: Progressing

## 2024-11-28 NOTE — Assessment & Plan Note (Addendum)
 Diet controlled Good control per last A1c

## 2024-11-28 NOTE — Telephone Encounter (Signed)
 error

## 2024-11-28 NOTE — Assessment & Plan Note (Addendum)
 Continue atorvastatin 

## 2024-11-28 NOTE — Plan of Care (Signed)

## 2024-11-28 NOTE — Progress Notes (Signed)
 Progress Note   Patient: Mary Stephens FMW:[id number] DOB: June 07, 1956 DOA: 11/20/2024     8 DOS: the patient was seen and examined on 11/28/2024   Brief hospital course: 68yo with h/o stage 4 CKD, HTN, HLD, and aortic dissection s/p repair in 2021 who presented on 12/9 with rectal bleeding.  Flex sig on 12/11 showed anorectal mass that is biopsy positive for adenocarcinoma.  She was transferred to [facility] for initiation of concurrent chemo/radiation which was started on 12/16.      Assessment & Plan Acute GI bleeding Rectal adenocarcinoma (HCC) Symptomatic anemia Presented with acute rectal bleeding No more frank hematochezia and blood clots, states that she just has a little bit of pink blood when wiping Hb downtrending from ABLA, thus far has been transfused 4 units PRBC; transfuse for Hgb <7 Flex sig showed moderately differentiated adenocarcinoma CEA 26.2 No role for surgery at this time GI/radiation oncology/medical oncology following As needed narcotics for anal pain, also on stool softeners to minimize symptoms Started on chemotherapy, will complete on Saturday and can dc at that time Also on concurrent radiation History of hepatitis C Completed treatment with Harvoni  per prior notes  DM (diabetes mellitus), type 2 (HCC) Diet controlled Good control per last A1c Essential hypertension BP elevated to 155/96 Cautiously continued with amlodipine  and half of home dosing of metoprolol  Will resume home hydralazine  and metoprolol  dosing at this time HLD (hyperlipidemia) Continue atorvastatin  Acute renal failure superimposed on stage 4 chronic kidney disease (HCC) Creatinine peaked at 4.52, GFR 10 Current creatinine is improved to 3.25, GFR 15 Oncologist (Dr. Lanny) discussed with nephrologist (Dr. Tobie) on 12/12  and subsequently PICC line placed on nondominant arm for initiation of chemotherapy History of aortic dissection S/p hemiarch repair in 2021       Consultants: GI Surgery Radiation Oncology Oncology  Procedures: Flex sig 12/11 PICC line 12/12  Antibiotics: None  30 Day Unplanned Readmission Risk Score    Flowsheet Row ED to Hosp-Admission (Current) from 11/20/2024 in [place] [facility]  30 Day Unplanned Readmission Risk Score (%) 24.77 Filed at 11/28/2024 0801    This score is the patient's risk of an unplanned readmission within 30 days of being discharged (0 -100%). The score is based on dignosis, age, lab data, medications, orders, and past utilization.   Low:  0-14.9   Medium: 15-21.9   High: 22-29.9   Extreme: 30 and above           Subjective: Feeling well, no further rectal bleeding.  Would like to go home when possible.   Objective: Vitals:   11/28/24 1245 11/28/24 1943  BP: (!) 143/88 (!) 155/96  Pulse: 68 70  Resp: 14 18  Temp: 98.3 F (36.8 C) 98.7 F (37.1 C)  SpO2: 100% 99%    Intake/Output Summary (Last 24 hours) at 11/28/2024 1950 Last data filed at 11/28/2024 1900 Gross per 24 hour  Intake 464 ml  Output --  Net 464 ml   Filed Weights   11/20/24 1728 11/21/24 1440  Weight: 61.2 kg 61.1 kg    Exam:  General:  Appears calm and comfortable and is in NAD Eyes:  normal lids, iris ENT:  grossly normal hearing, lips & tongue, mmm Cardiovascular:  RRR. No LE edema.  Respiratory:   CTA bilaterally with no wheezes/rales/rhonchi.  Normal respiratory effort. Abdomen:  soft, NT, ND Skin:  no rash or induration seen on limited exam Musculoskeletal:  grossly normal tone BUE/BLE, good ROM, no  bony abnormality Psychiatric:  grossly normal mood and affect, speech fluent and appropriate, AOx3 Neurologic:  CN 2-12 grossly intact, moves all extremities in coordinated fashion  Data Reviewed: I have reviewed the patient's lab results since admission.  Pertinent labs for today include:   Na++ 147 BUN 26/Creatinine 3.25/GFR 15 WBC 5.2 Hgb 8, stable      Family Communication:  None present      Code Status: Full Code   Disposition: Status is: Inpatient Remains inpatient appropriate because: ongoing management     Time spent: 50 minutes  Unresulted Labs (From admission, onward)     Start     Ordered   11/29/24 0500  CBC  Tomorrow morning,   R       Question:  Specimen collection method  Answer:  IV Team=IV Team collect   11/28/24 1950   11/28/24 0000  CBC with Differential (Cancer Center Only)  STAT        11/28/24 1721   11/28/24 0000  CMP (Cancer Center only)  STAT        11/28/24 1721   11/28/24 0000  CBC with Differential (Cancer Center Only)  STAT        11/28/24 1721   11/28/24 0000  CMP (Cancer Center only)  STAT        11/28/24 1721   11/28/24 0000  CBC with Differential (Cancer Center Only)  STAT        11/28/24 1723   11/28/24 0000  CMP (Cancer Center only)  STAT        11/28/24 1723   11/26/24 0000  CBC with Differential (Cancer Center Only)  STAT        11/26/24 0754   11/26/24 0000  CMP (Cancer Center only)  STAT        11/26/24 0754   11/26/24 0000  CBC with Differential (Cancer Center Only)  STAT        11/26/24 0755   11/26/24 0000  CMP (Cancer Center only)  STAT        11/26/24 0755   11/26/24 0000  CBC with Differential (Cancer Center Only)  STAT        11/26/24 0755   11/26/24 0000  CMP (Cancer Center only)  STAT        11/26/24 0755             Author: Delon Herald, MD 11/28/2024 7:50 PM  For on call review [email or web address].

## 2024-11-28 NOTE — Assessment & Plan Note (Signed)
 Presented with acute rectal bleeding No more frank hematochezia and blood clots, states that she just has a little bit of pink blood when wiping Hb downtrending from ABLA, thus far has been transfused 4 units PRBC; transfuse for Hgb <7 Flex sig showed moderately differentiated adenocarcinoma CEA 26.2 No role for surgery at this time GI/radiation oncology/medical oncology following As needed narcotics for anal pain, also on stool softeners to minimize symptoms Started on chemotherapy, will complete on Saturday and can dc at that time Also on concurrent radiation

## 2024-11-28 NOTE — Assessment & Plan Note (Addendum)
 Creatinine peaked at 4.52, GFR 10 Current creatinine is improved to 3.25, GFR 15 Oncologist (Dr. Lanny) discussed with nephrologist (Dr. Tobie) on 12/12  and subsequently PICC line placed on nondominant arm for initiation of chemotherapy

## 2024-11-28 NOTE — Assessment & Plan Note (Addendum)
 BP elevated to 155/96 Cautiously continued with amlodipine  and half of home dosing of metoprolol  Will resume home hydralazine  and metoprolol  dosing at this time

## 2024-11-28 NOTE — Assessment & Plan Note (Addendum)
 Completed treatment with Harvoni  per prior notes

## 2024-11-28 NOTE — Progress Notes (Addendum)
 Mary Stephens   DOB:Sep 30, 1956   FM#:[id number]      ASSESSMENT & PLAN:  Mary Stephens presented 11/20/2024 with complaints of rectal bleeding. 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  Patient has been started on chemotherapy.  Medical oncology following closely.  Rectal adenocarcinoma, newly diagnosed Rectal bleeding - Patient complained of profuse rectal bleeding which precipitated PCP visit and she was referred to the ED. -- Denies further rectal bleeding at this time.    - Pathology 11/22/2024 confirms invasive moderately differentiated adenocarcinoma. - Tumor markers CEA elevated 26.2 - No role for surgery at this time. - Evaluated by radiation oncology.   - Initiated concurrent chemoradiation with continuous 5-FU infusion on 11/27/2024.  Today is cycle 1 day 2, patient tolerating well. -Medical oncology/Dr. Bronson following closely.  Anemia Iron  deficiency anemia - Secondary to rectal bleeding and malignancy - Hemoglobin 8.0.  Status post PRBC transfusion this admission. - Recommend PRBC transfusion for hemoglobin <7.0 - Continue to monitor CBC with differential   Thrombocytopenia - Mild and improving - Platelets 161K today - Continue to monitor CBC with differential    CKD - Creatinine and BUN with slight improvement  - Avoid nephrotoxic agents - Continue to monitor renal function   History of hep C, chronic - Completed treatment with Harvoni    Hypertension Hyperlipidemia Diabetes  - Antihypertensives as ordered. - Continue to monitor blood pressure closely - On statin.    Code Status Full   Subjective:  Patient seen awake and alert sitting up in bed.  IV chemotherapy infusing well.  Patient with no acute GI complaints or complaints of further rectal bleeding.  Sleeping and eating well. States she is anxious to go home.  No acute distress is noted.  Objective:   Intake/Output Summary (Last 24 hours) at 11/28/2024 0958 Last data filed at 11/27/2024  2050 Gross per 24 hour  Intake 444.16 ml  Output --  Net 444.16 ml     PHYSICAL EXAMINATION: ECOG PERFORMANCE STATUS: 1 - Symptomatic but completely ambulatory  Vitals:   11/28/24 0600 11/28/24 0936  BP: (!) 148/89 (!) 141/95  Pulse: 67 70  Resp: 18   Temp: 98.7 F (37.1 C)   SpO2: 100%    Filed Weights   11/20/24 1728 11/21/24 1440  Weight: 135 lb (61.2 kg) 134 lb 11.2 oz (61.1 kg)    GENERAL: alert, no distress and comfortable SKIN: skin color, texture, turgor are normal, no rashes or significant lesions EYES: normal, conjunctiva are pink and non-injected, sclera clear OROPHARYNX: no exudate, no erythema and lips, buccal mucosa, and tongue normal  NECK: supple, thyroid  normal size, non-tender, without nodularity LYMPH: no palpable lymphadenopathy in the cervical, axillary or inguinal LUNGS: clear to auscultation and percussion with normal breathing effort HEART: regular rate & rhythm and no murmurs and no lower extremity edema ABDOMEN: abdomen soft, non-tender and normal bowel sounds MUSCULOSKELETAL: no cyanosis of digits and no clubbing  PSYCH: alert & oriented x 3 with fluent speech NEURO: no focal motor/sensory deficits   All questions were answered. The patient knows to call the clinic with any problems, questions or concerns.   The total time spent in the appointment was 40 minutes encounter with patient including review of chart and various tests results, discussions about plan of care and coordination of care plan  Mary JINNY Brunner, NP 11/28/2024 9:58 AM    Labs Reviewed:  Lab Results  Component Value Date   WBC 5.2 11/28/2024   HGB 8.0 (L) 11/28/2024  HCT 25.3 (L) 11/28/2024   MCV 96.9 11/28/2024   PLT 161 11/28/2024   Recent Labs    11/20/24 1417 11/21/24 0316 11/22/24 0333 11/24/24 0439 11/25/24 0400 11/28/24 0523  NA 142 140   < > 141 145 147*  K 3.9 3.4*   < > 3.7 4.1 3.8  CL 113* 111   < > 109 111 113*  CO2 21* 22   < > 25 25 25    GLUCOSE 107* 84   < > 91 108* 81  BUN 36* 35*   < > 30* 34* 26*  CREATININE 4.44* 4.52*   < > 3.75* 3.92* 3.25*  CALCIUM  9.0 8.3*   < > 8.1* 8.2* 8.7*  GFRNONAA 10* 10*   < > 12* 12* 15*  PROT 5.9* 5.5*  --   --   --  5.6*  ALBUMIN  3.3* 3.0*  --   --   --  3.7  AST 15 12*  --   --   --  15  ALT 7 7  --   --   --  6  ALKPHOS 61 52  --   --   --  57  BILITOT 0.7 0.8  --   --   --  0.4   < > = values in this interval not displayed.    Studies Reviewed:   CT CHEST WO CONTRAST Result Date: 11/23/2024 EXAM: CT CHEST WITHOUT CONTRAST 11/23/2024 07:55:44 PM TECHNIQUE: CT of the chest was performed without the administration of intravenous contrast. Multiplanar reformatted images are provided for review. Automated exposure control, iterative reconstruction, and/or weight based adjustment of the mA/kV was utilized to reduce the radiation dose to as low as reasonably achievable. COMPARISON: 04/15/2024 CLINICAL HISTORY: Rectal cancer, staging. 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 Cardiomegaly. Small pericardial effusion. Mild 3-vessel coronary atherosclerosis. Aorta: Suspected stable thoracic aortic dissection (images 23 and 61), poorly visualized on unenhanced CT. Associated stable thoracic aortic aneurysm at the level of the transverse aortic arch measuring 4.4 cm (image 19). Central airways: The central airways are clear. Vessels: Left PICC terminates in upper SVC. LYMPH NODES: No mediastinal, hilar or axillary lymphadenopathy. LUNGS AND PLEURA: Scattered calcified granulomata, benign. Two left upper lobe pulmonary nodules measuring up to 3 mm (images 50 to 51), unchanged, benign. Mild linear scarring/atelectasis in bilateral lower lobes. No pleural effusion or pneumothorax. SOFT TISSUES/BONES: Median sternotomy. No acute abnormality of the bones or soft tissues. UPPER ABDOMEN: Limited images of the upper abdomen demonstrate an atrophic left kidney.  2.1 cm right adrenal nodule compatible with benign adrenal adenoma. IMPRESSION: 1. No evidence of metastatic disease. Two stable 3 mm left upper lobe nodules, benign. 2. Status post aortic valve/root repair. Suspected stable thoracic aortic dissection with associated aneurysm, as above. 3. Cardiomegaly with a small pericardial effusion. Electronically signed by: Pinkie Pebbles MD 11/23/2024 09:44 PM EST RP Workstation: [id number]   US  EKG SITE RITE Result Date: 11/23/2024 If Site Rite image not attached, placement could not be confirmed due to current cardiac rhythm.  MR PELVIS WO CM RECTAL CA STAGING Result Date: 11/23/2024 CLINICAL DATA:  New diagnosis of anal cancer. EXAM: MRI PELVIS WITHOUT CONTRAST TECHNIQUE: Multiplanar multisequence MR imaging of the pelvis was performed. No intravenous contrast was administered. Ultrasound gel was administered per rectum to optimize tumor evaluation. COMPARISON:  CTA of 11/21/2024 FINDINGS: TUMOR LOCATION Tumor distance from Anal Verge/Skin surface: Not applicable. Appears to protrude from the anus, including on  image 32/5. Tumor distance to Internal Anal sphincter: Not applicable. TUMOR DESCRIPTION Circumferential extent: Circumferential, including on image 31/5. Tumor Size and volume: 5.3 by 2.7 x 2.8 cm on images 18/3 and 31/5. Volume 20.8 cc T - CATEGORY Based on lesion size and presumed anal primary, T3. Levator Ani Involvement: Present, especially eccentric right and posteriorly on image 31/5. N - CATEGORY Right inguinal adenopathy at 1.5 cm on image 15/5. This node is hyperenhancing on image 185/9 of the prior CTA. A right external iliac 9 mm node on image 8/5 is also hyperenhancing on the prior CTA. N1b. Other: No significant free fluid. A left ovarian T2 hypointense 2.5 x 2.0 cm on image 13/5. No bowel obstruction. The patient halted the exam after the initial T2 weighted imaging was performed. IMPRESSION: Exam was halted at patient request after the  initial T2 weighted images. Anal primary, staged as T3, N1b as detailed above. Involvement of the levator ani, as detailed above. 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 Consider nonemergent outpatient follow-up pre and postcontrast MRI at 3 months versus ultrasound surveillance. Electronically Signed   By: Rockey Kilts M.D.   On: 11/23/2024 13:57   CT ANGIO GI BLEED Result Date: 11/21/2024 CLINICAL DATA:  GI bleed. EXAM: CTA ABDOMEN AND PELVIS WITHOUT AND WITH CONTRAST TECHNIQUE: Multidetector CT imaging of the abdomen and pelvis was performed using the standard protocol during bolus administration of intravenous contrast. Multiplanar reconstructed images and MIPs were obtained and reviewed to evaluate the vascular anatomy. RADIATION DOSE REDUCTION: This exam was performed according to the departmental dose-optimization program which includes automated exposure control, adjustment of the mA and/or kV according to patient size and/or use of iterative reconstruction technique. CONTRAST:  75mL OMNIPAQUE  IOHEXOL  350 MG/ML SOLN COMPARISON:  CT abdomen pelvis dated 12/02/2020. FINDINGS: VASCULAR Aorta: Old dissection flap in the abdominal aorta. There is mild atherosclerotic calcification of the aorta. The true and false lumens appear patent. Celiac: The celiac artery is patent. SMA: There is extension of the dissection flap into the SMA. The SMA is patent. Renals: The right renal arteries are patent. The left linear artery is narrowed and appears scarred with diminished flow. IMA: The IMA is patent. Inflow: Extension of the dissection flap into the left common iliac and external iliac arteries. The iliac arteries are patent bilaterally. Proximal Outflow: The visualized proximal outflow is patent. Veins: The IVC is unremarkable. The SMV, splenic vein, and main portal vein are patent. No portal venous gas. Review of the MIP images confirms the above findings. NON-VASCULAR Lower chest: Small  bilateral pleural effusions. Partially visualized pericardial effusion measuring 7 mm in thickness. No intra-abdominal free air.  Small free fluid in the pelvis. Hepatobiliary: The liver is unremarkable. The gallbladder is contracted. Small gallstones suspected in the gallbladder. Pancreas: Unremarkable. No pancreatic ductal dilatation or surrounding inflammatory changes. Spleen: Normal in size without focal abnormality. Adrenals/Urinary Tract: Bilateral adrenal thickening/hyperplasia or adenoma similar to prior CT. Left renal parenchyma atrophy. Several left renal cysts. A 12 mm high attenuating lesion from the posterior lower pole of the left kidney as well as a 2 cm high attenuating lesion from the upper pole of the left kidney are not characterized on this CT but were present on the prior CT, likely proteinaceous cysts. No hydronephrosis on either side. The urinary bladder is grossly unremarkable. Stomach/Bowel: Moderate stool throughout the colon. No bowel obstruction or active inflammation. No evidence of active GI bleed. The appendix is normal. Lymphatic: No adenopathy.  Reproductive: Hysterectomy. Other: None Musculoskeletal: No acute or significant osseous findings. IMPRESSION: 1. No evidence of active GI bleed. 2. Old dissection flap in the abdominal aorta with extension of the dissection flap into the SMA and left common iliac and external iliac arteries. The aorta and mesenteric vessels are patent. 3. Small bilateral pleural effusions. 4. Partially visualized pericardial effusion. 5. No bowel obstruction. Normal appendix. 6.  Aortic Atherosclerosis (ICD10-I70.0). Electronically Signed   By: Vanetta Chou M.D.   On: 11/21/2024 19:03   Addendum I have seen the patient, examined her. I agree with the assessment and and plan and have edited the notes.   Patient is tolerating chemotherapy and radiation very well, no nausea or other side effects.  Labs reviewed, overall stable.  She has not had a  significant rectal bleeding since we started chemoradiation.  Due to the safety of chemotherapy, she is not able to go home with the current bag of chemo, I recommend continue to stay in the hospital and finish chemo on Saturday before discharge.  Patient is agreeable with the plan.  I spoke with Dr. Barbarann.   Onita Mattock MD 11/28/2024

## 2024-11-28 NOTE — Assessment & Plan Note (Addendum)
 S/p hemiarch repair in 2021

## 2024-11-29 ENCOUNTER — Other Ambulatory Visit: Payer: Self-pay

## 2024-11-29 ENCOUNTER — Ambulatory Visit: Payer: Medicare (Managed Care)

## 2024-11-29 DIAGNOSIS — H9202 Otalgia, left ear: Secondary | ICD-10-CM | POA: Insufficient documentation

## 2024-11-29 DIAGNOSIS — K922 Gastrointestinal hemorrhage, unspecified: Secondary | ICD-10-CM | POA: Diagnosis not present

## 2024-11-29 LAB — RAD ONC ARIA SESSION SUMMARY
Course Elapsed Days: 2
Plan Fractions Treated to Date: 3
Plan Prescribed Dose Per Fraction: 1.8 Gy
Plan Total Fractions Prescribed: 25
Plan Total Prescribed Dose: 45 Gy
Reference Point Dosage Given to Date: 5.4 Gy
Reference Point Session Dosage Given: 1.8 Gy
Session Number: 3

## 2024-11-29 LAB — CBC
HCT: 27.6 % — ABNORMAL LOW (ref 36.0–46.0)
Hemoglobin: 8.8 g/dL — ABNORMAL LOW (ref 12.0–15.0)
MCH: 30.6 pg (ref 26.0–34.0)
MCHC: 31.9 g/dL (ref 30.0–36.0)
MCV: 95.8 fL (ref 80.0–100.0)
Platelets: 181 K/uL (ref 150–400)
RBC: 2.88 MIL/uL — ABNORMAL LOW (ref 3.87–5.11)
RDW: 17 % — ABNORMAL HIGH (ref 11.5–15.5)
WBC: 6.1 K/uL (ref 4.0–10.5)
nRBC: 0 % (ref 0.0–0.2)

## 2024-11-29 MED ORDER — PROCHLORPERAZINE EDISYLATE 10 MG/2ML IJ SOLN
10.0000 mg | INTRAMUSCULAR | Status: DC | PRN
  Administered 2024-11-30 – 2024-12-01 (×2): 10 mg via INTRAVENOUS
  Filled 2024-11-29 (×2): qty 2

## 2024-11-29 NOTE — Assessment & Plan Note (Addendum)
 S/p hemiarch repair in 2021

## 2024-11-29 NOTE — Assessment & Plan Note (Addendum)
 Diet controlled Good control per last A1c

## 2024-11-29 NOTE — TOC Progression Note (Signed)
 Transition of Care [facility]) - Progression Note    Patient Details  Name: MARLOWE CINQUEMANI MRN: [id number] Date of Birth: 12/30/1955  Transition of [facility] CM/SW Contact  Toy LITTIE Agar, RN Phone Number:[phone]  11/29/2024, 2:37 PM  Clinical Narrative:    Inpatient care manager continues to follow. Plan for weekend discharge with no IPCM needs noted.    Expected Discharge Plan:  (TBD) Barriers to Discharge: Continued Medical Work up               Expected Discharge Plan and Services       Living arrangements for the past 2 months: Apartment                                       Social Drivers of Health (SDOH) Interventions SDOH Screenings   Food Insecurity: Food Insecurity Present (11/24/2024)  Housing: High Risk (11/24/2024)  Transportation Needs: No Transportation Needs (11/24/2024)  Utilities: Not At Risk (11/24/2024)  Financial Resource Strain: Low Risk  (10/16/2024)   Received from [facility]  Social Connections: Unknown (11/24/2024)  Tobacco Use: High Risk (11/22/2024)    Readmission Risk Interventions     No data to display

## 2024-11-29 NOTE — Assessment & Plan Note (Addendum)
 Presented with acute rectal bleeding No more frank hematochezia and blood clots, states that she just has a little bit of pink blood when wiping Hb downtrending from ABLA, thus far has been transfused 4 units PRBC; transfuse for Hgb <7 Flex sig showed moderately differentiated adenocarcinoma CEA 26.2 No role for surgery at this time GI/radiation oncology/medical oncology following As needed narcotics for anal pain, also on stool softeners to minimize symptoms Started on chemotherapy, will complete on Saturday and can dc at that time Also on concurrent radiation Reported rectal pain today requiring IV pain control

## 2024-11-29 NOTE — Assessment & Plan Note (Addendum)
 Continue atorvastatin 

## 2024-11-29 NOTE — Assessment & Plan Note (Addendum)
 BP elevated to 155/96 Cautiously continued with amlodipine  and half of home dosing of metoprolol  Will resume home hydralazine  and metoprolol  dosing at this time

## 2024-11-29 NOTE — Plan of Care (Signed)

## 2024-11-29 NOTE — Assessment & Plan Note (Signed)
 Reported onset of L ear pain starting 12/17 No obvious external trauma Unremarkable evaluation with otoscope at bedside today Ongoing analgesia encouraged

## 2024-11-29 NOTE — Progress Notes (Cosign Needed)
 Mary Stephens   DOB:1956-10-13   FM#:[id number]      ASSESSMENT & PLAN:  Mary Stephens presented 11/20/2024 with complaints of rectal bleeding. 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  Patient has been started on chemotherapy.  Medical oncology following closely.   Rectal adenocarcinoma, newly diagnosed Rectal bleeding - Patient complained of profuse rectal bleeding which precipitated PCP visit and she was referred to the ED. -- Patient reports slight rectal bleeding when she used the bathroom earlier today.    - Pathology 11/22/2024 confirms invasive moderately differentiated adenocarcinoma. - Tumor markers CEA elevated 26.2 - No role for surgery at this time. - Evaluated by radiation oncology.   - Initiated concurrent chemoradiation with continuous 5-FU infusion on 11/27/2024.  Today is cycle 1 day 3, patient tolerating well. No complaints of nausea or vomiting.  -Medical oncology/Dr. Lanny following closely.   Anemia Iron  deficiency anemia - Secondary to rectal bleeding and malignancy - Hemoglobin 8.8.  Status post PRBC transfusion this admission. - Recommend PRBC transfusion for hemoglobin <7.0 - Continue to monitor CBC with differential   Thrombocytopenia - Platelets continues to improve - Platelets 181K today - Continue to monitor CBC with differential    CKD - Creatinine and BUN with slight improvement  - Avoid nephrotoxic agents - Continue to monitor renal function   History of hep C, chronic - Completed treatment with Harvoni    Hypertension Hyperlipidemia Diabetes  - Antihypertensives as ordered. - Continue to monitor blood pressure closely - On statin.    Code Status Full   Subjective:  Patient seen asleep in bed, awakens easily to verbal stimuli.  Chemo infusing well. States that she had a little blood from rectum when she used the bathroom.  No nausea or vomiting.  No other complaints offered.    Objective:   Intake/Output Summary (Last 24 hours)  at 11/29/2024 1013 Last data filed at 11/28/2024 2147 Gross per 24 hour  Intake 340 ml  Output --  Net 340 ml     PHYSICAL EXAMINATION: ECOG PERFORMANCE STATUS: 1 - Symptomatic but completely ambulatory  Vitals:   11/29/24 0537 11/29/24 0957  BP: (!) 145/99 (!) 141/95  Pulse: 69 70  Resp: 19   Temp: 98.5 F (36.9 C)   SpO2: 99% 100%   Filed Weights   11/20/24 1728 11/21/24 1440  Weight: 135 lb (61.2 kg) 134 lb 11.2 oz (61.1 kg)    GENERAL: alert, no distress and comfortable SKIN: skin color, texture, turgor are normal, no rashes or significant lesions EYES: normal, conjunctiva are pink and non-injected, sclera clear OROPHARYNX: no exudate, no erythema and lips, buccal mucosa, and tongue normal  NECK: supple, thyroid  normal size, non-tender, without nodularity LYMPH: no palpable lymphadenopathy in the cervical, axillary or inguinal LUNGS: clear to auscultation and percussion with normal breathing effort HEART: regular rate & rhythm and no murmurs and no lower extremity edema ABDOMEN: abdomen soft, non-tender and normal bowel sounds MUSCULOSKELETAL: no cyanosis of digits and no clubbing  PSYCH: alert & oriented x 3 with fluent speech NEURO: no focal motor/sensory deficits   All questions were answered. The patient knows to call the clinic with any problems, questions or concerns.   The total time spent in the appointment was 40 minutes encounter with patient including review of chart and various tests results, discussions about plan of care and coordination of care plan  Olam Mary Brunner, NP 11/29/2024 10:13 AM    Labs Reviewed:  Lab Results  Component Value Date  WBC 6.1 11/29/2024   HGB 8.8 (L) 11/29/2024   HCT 27.6 (L) 11/29/2024   MCV 95.8 11/29/2024   PLT 181 11/29/2024   Recent Labs    11/20/24 1417 11/21/24 0316 11/22/24 0333 11/24/24 0439 11/25/24 0400 11/28/24 0523  NA 142 140   < > 141 145 147*  K 3.9 3.4*   < > 3.7 4.1 3.8  CL 113* 111   < >  109 111 113*  CO2 21* 22   < > 25 25 25   GLUCOSE 107* 84   < > 91 108* 81  BUN 36* 35*   < > 30* 34* 26*  CREATININE 4.44* 4.52*   < > 3.75* 3.92* 3.25*  CALCIUM  9.0 8.3*   < > 8.1* 8.2* 8.7*  GFRNONAA 10* 10*   < > 12* 12* 15*  PROT 5.9* 5.5*  --   --   --  5.6*  ALBUMIN  3.3* 3.0*  --   --   --  3.7  AST 15 12*  --   --   --  15  ALT 7 7  --   --   --  6  ALKPHOS 61 52  --   --   --  57  BILITOT 0.7 0.8  --   --   --  0.4   < > = values in this interval not displayed.    Studies Reviewed:   CT CHEST WO CONTRAST Result Date: 11/23/2024 EXAM: CT CHEST WITHOUT CONTRAST 11/23/2024 07:55:44 PM TECHNIQUE: CT of the chest was performed without the administration of intravenous contrast. Multiplanar reformatted images are provided for review. Automated exposure control, iterative reconstruction, and/or weight based adjustment of the mA/kV was utilized to reduce the radiation dose to as low as reasonably achievable. COMPARISON: 04/15/2024 CLINICAL HISTORY: Rectal cancer, staging. 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 Cardiomegaly. Small pericardial effusion. Mild 3-vessel coronary atherosclerosis. Aorta: Suspected stable thoracic aortic dissection (images 23 and 61), poorly visualized on unenhanced CT. Associated stable thoracic aortic aneurysm at the level of the transverse aortic arch measuring 4.4 cm (image 19). Central airways: The central airways are clear. Vessels: Left PICC terminates in upper SVC. LYMPH NODES: No mediastinal, hilar or axillary lymphadenopathy. LUNGS AND PLEURA: Scattered calcified granulomata, benign. Two left upper lobe pulmonary nodules measuring up to 3 mm (images 50 to 51), unchanged, benign. Mild linear scarring/atelectasis in bilateral lower lobes. No pleural effusion or pneumothorax. SOFT TISSUES/BONES: Median sternotomy. No acute abnormality of the bones or soft tissues. UPPER ABDOMEN: Limited images of the upper  abdomen demonstrate an atrophic left kidney. 2.1 cm right adrenal nodule compatible with benign adrenal adenoma. IMPRESSION: 1. No evidence of metastatic disease. Two stable 3 mm left upper lobe nodules, benign. 2. Status post aortic valve/root repair. Suspected stable thoracic aortic dissection with associated aneurysm, as above. 3. Cardiomegaly with a small pericardial effusion. Electronically signed by: Pinkie Pebbles MD 11/23/2024 09:44 PM EST RP Workstation: [id number]   US  EKG SITE RITE Result Date: 11/23/2024 If Site Rite image not attached, placement could not be confirmed due to current cardiac rhythm.  MR PELVIS WO CM RECTAL CA STAGING Result Date: 11/23/2024 CLINICAL DATA:  New diagnosis of anal cancer. EXAM: MRI PELVIS WITHOUT CONTRAST TECHNIQUE: Multiplanar multisequence MR imaging of the pelvis was performed. No intravenous contrast was administered. Ultrasound gel was administered per rectum to optimize tumor evaluation. COMPARISON:  CTA of 11/21/2024 FINDINGS: TUMOR LOCATION Tumor distance from Anal Verge/Skin  surface: Not applicable. Appears to protrude from the anus, including on image 32/5. Tumor distance to Internal Anal sphincter: Not applicable. TUMOR DESCRIPTION Circumferential extent: Circumferential, including on image 31/5. Tumor Size and volume: 5.3 by 2.7 x 2.8 cm on images 18/3 and 31/5. Volume 20.8 cc T - CATEGORY Based on lesion size and presumed anal primary, T3. Levator Ani Involvement: Present, especially eccentric right and posteriorly on image 31/5. N - CATEGORY Right inguinal adenopathy at 1.5 cm on image 15/5. This node is hyperenhancing on image 185/9 of the prior CTA. A right external iliac 9 mm node on image 8/5 is also hyperenhancing on the prior CTA. N1b. Other: No significant free fluid. A left ovarian T2 hypointense 2.5 x 2.0 cm on image 13/5. No bowel obstruction. The patient halted the exam after the initial T2 weighted imaging was performed. IMPRESSION: Exam  was halted at patient request after the initial T2 weighted images. Anal primary, staged as T3, N1b as detailed above. Involvement of the levator ani, as detailed above. 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 Consider nonemergent outpatient follow-up pre and postcontrast MRI at 3 months versus ultrasound surveillance. Electronically Signed   By: Rockey Kilts M.D.   On: 11/23/2024 13:57   CT ANGIO GI BLEED Result Date: 11/21/2024 CLINICAL DATA:  GI bleed. EXAM: CTA ABDOMEN AND PELVIS WITHOUT AND WITH CONTRAST TECHNIQUE: Multidetector CT imaging of the abdomen and pelvis was performed using the standard protocol during bolus administration of intravenous contrast. Multiplanar reconstructed images and MIPs were obtained and reviewed to evaluate the vascular anatomy. RADIATION DOSE REDUCTION: This exam was performed according to the departmental dose-optimization program which includes automated exposure control, adjustment of the mA and/or kV according to patient size and/or use of iterative reconstruction technique. CONTRAST:  75mL OMNIPAQUE  IOHEXOL  350 MG/ML SOLN COMPARISON:  CT abdomen pelvis dated 12/02/2020. FINDINGS: VASCULAR Aorta: Old dissection flap in the abdominal aorta. There is mild atherosclerotic calcification of the aorta. The true and false lumens appear patent. Celiac: The celiac artery is patent. SMA: There is extension of the dissection flap into the SMA. The SMA is patent. Renals: The right renal arteries are patent. The left linear artery is narrowed and appears scarred with diminished flow. IMA: The IMA is patent. Inflow: Extension of the dissection flap into the left common iliac and external iliac arteries. The iliac arteries are patent bilaterally. Proximal Outflow: The visualized proximal outflow is patent. Veins: The IVC is unremarkable. The SMV, splenic vein, and main portal vein are patent. No portal venous gas. Review of the MIP images confirms the above  findings. NON-VASCULAR Lower chest: Small bilateral pleural effusions. Partially visualized pericardial effusion measuring 7 mm in thickness. No intra-abdominal free air.  Small free fluid in the pelvis. Hepatobiliary: The liver is unremarkable. The gallbladder is contracted. Small gallstones suspected in the gallbladder. Pancreas: Unremarkable. No pancreatic ductal dilatation or surrounding inflammatory changes. Spleen: Normal in size without focal abnormality. Adrenals/Urinary Tract: Bilateral adrenal thickening/hyperplasia or adenoma similar to prior CT. Left renal parenchyma atrophy. Several left renal cysts. A 12 mm high attenuating lesion from the posterior lower pole of the left kidney as well as a 2 cm high attenuating lesion from the upper pole of the left kidney are not characterized on this CT but were present on the prior CT, likely proteinaceous cysts. No hydronephrosis on either side. The urinary bladder is grossly unremarkable. Stomach/Bowel: Moderate stool throughout the colon. No bowel obstruction or active inflammation. No evidence  of active GI bleed. The appendix is normal. Lymphatic: No adenopathy. Reproductive: Hysterectomy. Other: None Musculoskeletal: No acute or significant osseous findings. IMPRESSION: 1. No evidence of active GI bleed. 2. Old dissection flap in the abdominal aorta with extension of the dissection flap into the SMA and left common iliac and external iliac arteries. The aorta and mesenteric vessels are patent. 3. Small bilateral pleural effusions. 4. Partially visualized pericardial effusion. 5. No bowel obstruction. Normal appendix. 6.  Aortic Atherosclerosis (ICD10-I70.0). Electronically Signed   By: Vanetta Chou M.D.   On: 11/21/2024 19:03

## 2024-11-29 NOTE — Assessment & Plan Note (Addendum)
 Creatinine peaked at 4.52, GFR 10 Current creatinine is improved to 3.25, GFR 15 Oncologist (Dr. Lanny) discussed with nephrologist (Dr. Tobie) on 12/12  and subsequently PICC line placed on nondominant arm for initiation of chemotherapy

## 2024-11-29 NOTE — Progress Notes (Signed)
 Progress Note   Patient: Mary Stephens FMW:[id number] DOB: [birth year] DOA: 11/20/2024     9 DOS: the patient was seen and examined on 11/29/2024   Brief hospital course: 68yo with h/o stage 4 CKD, HTN, HLD, and aortic dissection s/p repair in 2021 who presented on 12/9 with rectal bleeding.  Flex sig on 12/11 showed anorectal mass that is biopsy positive for adenocarcinoma.  She was transferred to [facility] for initiation of concurrent chemo/radiation which was started on 12/16.     Assessment & Plan Acute GI bleeding Rectal adenocarcinoma (HCC) Symptomatic anemia Presented with acute rectal bleeding No more frank hematochezia and blood clots, states that she just has a little bit of pink blood when wiping Hb downtrending from ABLA, thus far has been transfused 4 units PRBC; transfuse for Hgb <7 Flex sig showed moderately differentiated adenocarcinoma CEA 26.2 No role for surgery at this time GI/radiation oncology/medical oncology following As needed narcotics for anal pain, also on stool softeners to minimize symptoms Started on chemotherapy, will complete on Saturday and can dc at that time Also on concurrent radiation Reported rectal pain today requiring IV pain control Otalgia of left ear Reported onset of L ear pain starting 12/17 No obvious external trauma Unremarkable evaluation with otoscope at bedside today Ongoing analgesia encouraged History of hepatitis C Completed treatment with Harvoni  per prior notes  DM (diabetes mellitus), type 2 (HCC) Diet controlled Good control per last A1c Essential hypertension BP elevated to 155/96 Cautiously continued with amlodipine  and half of home dosing of metoprolol  Will resume home hydralazine  and metoprolol  dosing at this time HLD (hyperlipidemia) Continue atorvastatin  Acute renal failure superimposed on stage 4 chronic kidney disease (HCC) Creatinine peaked at 4.52, GFR 10 Current creatinine is improved to 3.25, GFR  15 Oncologist (Dr. Lanny) discussed with nephrologist (Dr. Tobie) on 12/12  and subsequently PICC line placed on nondominant arm for initiation of chemotherapy History of aortic dissection S/p hemiarch repair in 2021      Consultants: GI Surgery Radiation Oncology Oncology   Procedures: Flex sig 12/11 PICC line 12/12   Antibiotics: None  30 Day Unplanned Readmission Risk Score    Flowsheet Row ED to Hosp-Admission (Current) from 11/20/2024 in [place] [facility]  30 Day Unplanned Readmission Risk Score (%) 25.33 Filed at 11/29/2024 0801    This score is the patient's risk of an unplanned readmission within 30 days of being discharged (0 -100%). The score is based on dignosis, age, lab data, medications, orders, and past utilization.   Low:  0-14.9   Medium: 15-21.9   High: 22-29.9   Extreme: 30 and above           Subjective: Reports L ear pain, rectal pain.  Discussed diagnosis and treatment plan, showed pictures of flex sig and MRI to patient at bedside today.   Objective: Vitals:   11/29/24 0957 11/29/24 1639  BP: (!) 141/95 135/71  Pulse: 70   Resp:    Temp:    SpO2: 100%     Intake/Output Summary (Last 24 hours) at 11/29/2024 1733 Last data filed at 11/28/2024 2147 Gross per 24 hour  Intake 223 ml  Output --  Net 223 ml   Filed Weights   11/20/24 1728 11/21/24 1440  Weight: 61.2 kg 61.1 kg    Exam:  General:  Appears calm and comfortable and is in NAD Eyes:  normal lids, iris ENT:  grossly normal hearing, lips & tongue, mmm; unremarkable otic exam bilaterally Cardiovascular:  RRR. No LE edema.  Respiratory:   CTA bilaterally with no wheezes/rales/rhonchi.  Normal respiratory effort. Abdomen:  soft, NT, ND Skin:  no rash or induration seen on limited exam Musculoskeletal:  grossly normal tone BUE/BLE, good ROM, no bony abnormality Psychiatric:  grossly normal mood and affect, speech fluent and appropriate, AOx3 Neurologic:  CN 2-12  grossly intact, moves all extremities in coordinated fashion  Data Reviewed: I have reviewed the patient's lab results since admission.  Pertinent labs for today include:   Stable CBC     Family Communication: None present     Code Status: Full Code  Barriers to discharge:  chemotherapy through 12/21  Disposition: Status is: Inpatient Remains inpatient appropriate because: ongoing continuous chemotherapy     Time spent: 50 minutes  Unresulted Labs (From admission, onward)     Start     Ordered   11/30/24 0500  CBC  Tomorrow morning,   R       Question:  Specimen collection method  Answer:  IV Team=IV Team collect   11/29/24 1733   11/30/24 0500  Basic metabolic panel with GFR  Tomorrow morning,   R       Question:  Specimen collection method  Answer:  IV Team=IV Team collect   11/29/24 1733   11/28/24 0000  CBC with Differential (Cancer Center Only)  STAT        11/28/24 1721   11/28/24 0000  CMP (Cancer Center only)  STAT        11/28/24 1721   11/28/24 0000  CBC with Differential (Cancer Center Only)  STAT        11/28/24 1721   11/28/24 0000  CMP (Cancer Center only)  STAT        11/28/24 1721   11/28/24 0000  CBC with Differential (Cancer Center Only)  STAT        11/28/24 1723   11/28/24 0000  CMP (Cancer Center only)  STAT        11/28/24 1723   11/26/24 0000  CBC with Differential (Cancer Center Only)  STAT        11/26/24 0754   11/26/24 0000  CMP (Cancer Center only)  STAT        11/26/24 0754   11/26/24 0000  CBC with Differential (Cancer Center Only)  STAT        11/26/24 0755   11/26/24 0000  CMP (Cancer Center only)  STAT        11/26/24 0755   11/26/24 0000  CBC with Differential (Cancer Center Only)  STAT        11/26/24 0755   11/26/24 0000  CMP (Cancer Center only)  STAT        11/26/24 0755             Author: Delon Herald, MD 11/29/2024 5:33 PM  For on call review [email or web address].

## 2024-11-29 NOTE — Plan of Care (Signed)
  Problem: Coping: Goal: Level of anxiety will decrease Outcome: Progressing   Problem: Pain Managment: Goal: General experience of comfort will improve and/or be controlled Outcome: Progressing   Problem: Safety: Goal: Ability to remain free from injury will improve Outcome: Progressing   Problem: Skin Integrity: Goal: Risk for impaired skin integrity will decrease Outcome: Progressing

## 2024-11-29 NOTE — Assessment & Plan Note (Addendum)
 Completed treatment with Harvoni  per prior notes

## 2024-11-30 ENCOUNTER — Other Ambulatory Visit: Payer: Self-pay

## 2024-11-30 ENCOUNTER — Ambulatory Visit: Payer: Medicare (Managed Care)

## 2024-11-30 ENCOUNTER — Encounter: Payer: Self-pay | Admitting: Hematology

## 2024-11-30 ENCOUNTER — Telehealth (HOSPITAL_COMMUNITY): Payer: Self-pay | Admitting: Pharmacist

## 2024-11-30 DIAGNOSIS — K922 Gastrointestinal hemorrhage, unspecified: Secondary | ICD-10-CM | POA: Diagnosis not present

## 2024-11-30 DIAGNOSIS — D509 Iron deficiency anemia, unspecified: Secondary | ICD-10-CM | POA: Insufficient documentation

## 2024-11-30 LAB — BASIC METABOLIC PANEL WITH GFR
Anion gap: 9 (ref 5–15)
BUN: 24 mg/dL — ABNORMAL HIGH (ref 8–23)
CO2: 24 mmol/L (ref 22–32)
Calcium: 9 mg/dL (ref 8.9–10.3)
Chloride: 110 mmol/L (ref 98–111)
Creatinine, Ser: 3.13 mg/dL — ABNORMAL HIGH (ref 0.44–1.00)
GFR, Estimated: 15 mL/min — ABNORMAL LOW
Glucose, Bld: 104 mg/dL — ABNORMAL HIGH (ref 70–99)
Potassium: 3.4 mmol/L — ABNORMAL LOW (ref 3.5–5.1)
Sodium: 143 mmol/L (ref 135–145)

## 2024-11-30 LAB — RAD ONC ARIA SESSION SUMMARY
Course Elapsed Days: 3
Plan Fractions Treated to Date: 4
Plan Prescribed Dose Per Fraction: 1.8 Gy
Plan Total Fractions Prescribed: 25
Plan Total Prescribed Dose: 45 Gy
Reference Point Dosage Given to Date: 7.2 Gy
Reference Point Session Dosage Given: 1.8 Gy
Session Number: 4

## 2024-11-30 LAB — CBC
HCT: 27 % — ABNORMAL LOW (ref 36.0–46.0)
Hemoglobin: 8.5 g/dL — ABNORMAL LOW (ref 12.0–15.0)
MCH: 30.6 pg (ref 26.0–34.0)
MCHC: 31.5 g/dL (ref 30.0–36.0)
MCV: 97.1 fL (ref 80.0–100.0)
Platelets: 182 K/uL (ref 150–400)
RBC: 2.78 MIL/uL — ABNORMAL LOW (ref 3.87–5.11)
RDW: 16.8 % — ABNORMAL HIGH (ref 11.5–15.5)
WBC: 5.2 K/uL (ref 4.0–10.5)
nRBC: 0 % (ref 0.0–0.2)

## 2024-11-30 MED ORDER — POTASSIUM CHLORIDE CRYS ER 20 MEQ PO TBCR
40.0000 meq | EXTENDED_RELEASE_TABLET | Freq: Once | ORAL | Status: AC
  Administered 2024-11-30: 40 meq via ORAL
  Filled 2024-11-30: qty 2

## 2024-11-30 NOTE — Telephone Encounter (Signed)
 Patient referred to infusion pharmacy team for ambulatory infusion of IV iron .  Insurance - [employer] [place] Site of care - Site of care: [facility] Dx code - N18.9/D63.1/D50.9 IV Iron  Therapy - Venofer  300mg  x 2 Infusion appointments - Scheduling team will schedule patient as soon as possible.   Received Venofer  300mg  IV x 1 on 11/23/2024  Sherry Pennant, PharmD, MPH, BCPS, CPP Clinical Pharmacist

## 2024-11-30 NOTE — Assessment & Plan Note (Addendum)
 Diet controlled Good control per last A1c

## 2024-11-30 NOTE — Assessment & Plan Note (Addendum)
 Presented with acute rectal bleeding No more frank hematochezia and blood clots, states that she just has a little bit of pink blood when wiping Hb downtrending from ABLA, thus far has been transfused 4 units PRBC; Hgb appears to be stable, she has also received IV iron  and will be arranged for more IV iron  as an outpatient Flex sig showed moderately differentiated adenocarcinoma CEA 26.2 No role for surgery at this time GI/radiation oncology/medical oncology following As needed narcotics for anal pain, also on stool softeners to minimize symptoms Started on chemotherapy, will complete tomorrow and can dc at that time Also on concurrent radiation through 01/07/2025 (appears to be roughly 27 treatments based on the calendar provided) Ongoing rectal pain noted, encouraged to try PO pain control to ensure that she will be able to manage her pain at home Dr. Lanny has continued to consult and recommends leaving PICC line at time of dc for ongoing use with continued chemotherapy

## 2024-11-30 NOTE — Assessment & Plan Note (Addendum)
 Completed treatment with Harvoni  per prior notes

## 2024-11-30 NOTE — Assessment & Plan Note (Addendum)
 Reported onset of L ear pain starting 12/17 No obvious external trauma Unremarkable evaluation with otoscope at bedside on 12/18 Ongoing analgesia encouraged She reports inprovement

## 2024-11-30 NOTE — Assessment & Plan Note (Addendum)
 Continue atorvastatin 

## 2024-11-30 NOTE — Progress Notes (Addendum)
 " Progress Note   Patient: Mary Stephens FMW:[id number] DOB: 07-Jul-1956 DOA: 11/20/2024     10 DOS: the patient was seen and examined on 11/30/2024   Brief hospital course: 68yo with h/o stage 4 CKD, HTN, HLD, and aortic dissection s/p repair in 2021 who presented on 12/9 with rectal bleeding.  Flex sig on 12/11 showed anorectal mass that is biopsy positive for adenocarcinoma.  She was transferred to [facility] for initiation of concurrent chemo/radiation which was started on 12/16.    Assessment & Plan Acute GI bleeding Rectal adenocarcinoma (HCC) Symptomatic anemia Presented with acute rectal bleeding No more frank hematochezia and blood clots, states that she just has a little bit of pink blood when wiping Hb downtrending from ABLA, thus far has been transfused 4 units PRBC; Hgb appears to be stable, she has also received IV iron  and will be arranged for more IV iron  as an outpatient Flex sig showed moderately differentiated adenocarcinoma CEA 26.2 No role for surgery at this time GI/radiation oncology/medical oncology following As needed narcotics for anal pain, also on stool softeners to minimize symptoms Started on chemotherapy, will complete tomorrow and can dc at that time Also on concurrent radiation through 01/07/2025 (appears to be roughly 27 treatments based on the calendar provided) Ongoing rectal pain noted, encouraged to try PO pain control to ensure that she will be able to manage her pain at home Dr. Lanny has continued to consult and recommends leaving PICC line at time of dc for ongoing use with continued chemotherapy Otalgia of left ear Reported onset of L ear pain starting 12/17 No obvious external trauma Unremarkable evaluation with otoscope at bedside on 12/18 Ongoing analgesia encouraged She reports inprovement History of hepatitis C Completed treatment with Harvoni  per prior notes  DM (diabetes mellitus), type 2 (HCC) Diet controlled Good control per last  A1c Essential hypertension BP elevated to 155/96, improved to 146/81 today Continue amlodipine , hydralazine , and metoprolol  HLD (hyperlipidemia) Continue atorvastatin  Acute renal failure superimposed on stage 4 chronic kidney disease (HCC) Creatinine peaked at 4.52, GFR 10 Current creatinine is improved to 3.25, GFR 15 Oncologist (Dr. Lanny) discussed with nephrologist (Dr. Tobie) on 12/12  and subsequently PICC line placed on nondominant arm for initiation of chemotherapy History of aortic dissection S/p hemiarch repair in 2021      Consultants: GI Surgery Radiation Oncology Oncology   Procedures: Flex sig 12/11 PICC line 12/12   Antibiotics: None  30 Day Unplanned Readmission Risk Score    Flowsheet Row ED to Hosp-Admission (Current) from 11/20/2024 in [place] [facility]  30 Day Unplanned Readmission Risk Score (%) 25.75 Filed at 11/30/2024 0801    This score is the patient's risk of an unplanned readmission within 30 days of being discharged (0 -100%). The score is based on dignosis, age, lab data, medications, orders, and past utilization.   Low:  0-14.9   Medium: 15-21.9   High: 22-29.9   Extreme: 30 and above           Subjective: Feeling ok.  Still with rectal pain.  Eager to leave the hospital as early as possible tomorrow.   Objective: Vitals:   11/30/24 1353 11/30/24 1639  BP: (!) 146/81 139/77  Pulse: 69 66  Resp: 17   Temp: 98.8 F (37.1 C)   SpO2: 100%     Intake/Output Summary (Last 24 hours) at 11/30/2024 1651 Last data filed at 11/30/2024 1030 Gross per 24 hour  Intake 247 ml  Output --  Net 247 ml   Filed Weights   11/20/24 1728 11/21/24 1440  Weight: 61.2 kg 61.1 kg    Exam:  General:  Appears calm and comfortable and is in NAD Eyes:  normal lids, iris ENT:  grossly normal hearing, lips & tongue, mmm Cardiovascular:  RRR. No LE edema.  Respiratory:   CTA bilaterally with no wheezes/rales/rhonchi.  Normal respiratory  effort. Abdomen:  soft, NT, ND Skin:  no rash or induration seen on limited exam Musculoskeletal:  grossly normal tone BUE/BLE, good ROM, no bony abnormality Psychiatric:  grossly normal mood and affect, speech fluent and appropriate, AOx3 Neurologic:  CN 2-12 grossly intact, moves all extremities in coordinated fashion  Data Reviewed: I have reviewed the patient's lab results since admission.  Pertinent labs for today include:   K+ 3.4 Glucose 104 BUN 24/Creatinine 3.13/GFR 15, stable WBC 5.2 Hgb 8.5     Family Communication: None present; I spoke with her daughter by telephone - prefers Meds to [facility]      Code Status: Full Code    Disposition: Status is: Inpatient Remains inpatient appropriate because: ongoing management     Time spent: 50 minutes  Unresulted Labs (From admission, onward)     Start     Ordered   12/01/24 0500  Basic metabolic panel with GFR  Tomorrow morning,   R       Question:  Specimen collection method  Answer:  IV Team=IV Team collect   11/30/24 1646   12/01/24 0500  CBC  Tomorrow morning,   R       Question:  Specimen collection method  Answer:  IV Team=IV Team collect   11/30/24 1646   11/28/24 0000  CBC with Differential (Cancer Center Only)  STAT        11/28/24 1721   11/28/24 0000  CMP (Cancer Center only)  STAT        11/28/24 1721   11/28/24 0000  CBC with Differential (Cancer Center Only)  STAT        11/28/24 1721   11/28/24 0000  CMP (Cancer Center only)  STAT        11/28/24 1721   11/28/24 0000  CBC with Differential (Cancer Center Only)  STAT        11/28/24 1723   11/28/24 0000  CMP (Cancer Center only)  STAT        11/28/24 1723   11/26/24 0000  CBC with Differential (Cancer Center Only)  STAT        11/26/24 0754   11/26/24 0000  CMP (Cancer Center only)  STAT        11/26/24 0754   11/26/24 0000  CBC with Differential (Cancer Center Only)  STAT        11/26/24 0755   11/26/24 0000  CMP (Cancer Center only)  STAT         11/26/24 0755   11/26/24 0000  CBC with Differential (Cancer Center Only)  STAT        11/26/24 0755   11/26/24 0000  CMP (Cancer Center only)  STAT        11/26/24 0755             Author: Delon Herald, MD 11/30/2024 4:51 PM  For on call review [email or web address].            "

## 2024-11-30 NOTE — Plan of Care (Signed)

## 2024-11-30 NOTE — Assessment & Plan Note (Addendum)
 Creatinine peaked at 4.52, GFR 10 Current creatinine is improved to 3.25, GFR 15 Oncologist (Dr. Lanny) discussed with nephrologist (Dr. Tobie) on 12/12  and subsequently PICC line placed on nondominant arm for initiation of chemotherapy

## 2024-11-30 NOTE — Assessment & Plan Note (Addendum)
 S/p hemiarch repair in 2021

## 2024-11-30 NOTE — Assessment & Plan Note (Addendum)
 BP elevated to 155/96, improved to 146/81 today Continue amlodipine , hydralazine , and metoprolol 

## 2024-11-30 NOTE — Plan of Care (Signed)
  Problem: Activity: Goal: Risk for activity intolerance will decrease Outcome: Progressing   Problem: Pain Managment: Goal: General experience of comfort will improve and/or be controlled Outcome: Progressing   Problem: Safety: Goal: Ability to remain free from injury will improve Outcome: Progressing   Problem: Skin Integrity: Goal: Risk for impaired skin integrity will decrease Outcome: Progressing

## 2024-12-01 ENCOUNTER — Other Ambulatory Visit (HOSPITAL_COMMUNITY): Payer: Self-pay

## 2024-12-01 ENCOUNTER — Encounter (HOSPITAL_COMMUNITY): Payer: Self-pay | Admitting: Internal Medicine

## 2024-12-01 ENCOUNTER — Other Ambulatory Visit: Payer: Self-pay

## 2024-12-01 ENCOUNTER — Encounter: Payer: Self-pay | Admitting: Hematology

## 2024-12-01 DIAGNOSIS — K922 Gastrointestinal hemorrhage, unspecified: Secondary | ICD-10-CM | POA: Diagnosis not present

## 2024-12-01 LAB — BASIC METABOLIC PANEL WITH GFR
Anion gap: 10 (ref 5–15)
BUN: 27 mg/dL — ABNORMAL HIGH (ref 8–23)
CO2: 25 mmol/L (ref 22–32)
Calcium: 8.8 mg/dL — ABNORMAL LOW (ref 8.9–10.3)
Chloride: 112 mmol/L — ABNORMAL HIGH (ref 98–111)
Creatinine, Ser: 3.52 mg/dL — ABNORMAL HIGH (ref 0.44–1.00)
GFR, Estimated: 13 mL/min — ABNORMAL LOW
Glucose, Bld: 89 mg/dL (ref 70–99)
Potassium: 3.9 mmol/L (ref 3.5–5.1)
Sodium: 146 mmol/L — ABNORMAL HIGH (ref 135–145)

## 2024-12-01 LAB — CBC
HCT: 26.9 % — ABNORMAL LOW (ref 36.0–46.0)
Hemoglobin: 8.5 g/dL — ABNORMAL LOW (ref 12.0–15.0)
MCH: 31 pg (ref 26.0–34.0)
MCHC: 31.6 g/dL (ref 30.0–36.0)
MCV: 98.2 fL (ref 80.0–100.0)
Platelets: 201 K/uL (ref 150–400)
RBC: 2.74 MIL/uL — ABNORMAL LOW (ref 3.87–5.11)
RDW: 16.9 % — ABNORMAL HIGH (ref 11.5–15.5)
WBC: 5.7 K/uL (ref 4.0–10.5)
nRBC: 0 % (ref 0.0–0.2)

## 2024-12-01 MED ORDER — HEPARIN SOD (PORK) LOCK FLUSH 100 UNIT/ML IV SOLN: 250.0000 [IU] | INTRAVENOUS | Status: AC | PRN

## 2024-12-01 MED ORDER — OXYCODONE HCL 5 MG PO TABS
5.0000 mg | ORAL_TABLET | Freq: Four times a day (QID) | ORAL | 0 refills | Status: AC | PRN
  Filled 2024-12-01: qty 30, 8d supply, fill #0

## 2024-12-01 MED ORDER — SENNOSIDES-DOCUSATE SODIUM 8.6-50 MG PO TABS
1.0000 | ORAL_TABLET | Freq: Two times a day (BID) | ORAL | 0 refills | Status: AC
  Filled 2024-12-01: qty 60, 30d supply, fill #0

## 2024-12-01 MED ORDER — HEPARIN SOD (PORK) LOCK FLUSH 100 UNIT/ML IV SOLN
INTRAVENOUS | Status: AC
  Administered 2024-12-01: 250 [IU]
  Filled 2024-12-01: qty 5

## 2024-12-01 MED ORDER — ONDANSETRON 4 MG PO TBDP
4.0000 mg | ORAL_TABLET | Freq: Three times a day (TID) | ORAL | 0 refills | Status: AC | PRN
  Filled 2024-12-01: qty 20, 7d supply, fill #0

## 2024-12-01 MED ORDER — LORAZEPAM 1 MG PO TABS
1.0000 mg | ORAL_TABLET | ORAL | 0 refills | Status: AC | PRN
  Filled 2024-12-01: qty 30, 5d supply, fill #0

## 2024-12-01 MED ORDER — POLYETHYLENE GLYCOL 3350 17 GM/SCOOP PO POWD
17.0000 g | Freq: Every day | ORAL | 0 refills | Status: AC | PRN
  Filled 2024-12-01: qty 238, 14d supply, fill #0

## 2024-12-01 NOTE — Plan of Care (Signed)

## 2024-12-01 NOTE — Assessment & Plan Note (Addendum)
 Diet controlled Good control per last A1c

## 2024-12-01 NOTE — Assessment & Plan Note (Addendum)
 Reported onset of L ear pain starting 12/17 No obvious external trauma Unremarkable evaluation with otoscope at bedside on 12/18 Ongoing analgesia encouraged Resolved

## 2024-12-01 NOTE — Assessment & Plan Note (Addendum)
 Completed treatment with Harvoni  per prior notes

## 2024-12-01 NOTE — Progress Notes (Addendum)
 Patient made aware of discharge orders stating to discharge after chemo infusion completed. With medication running at 11mL an hour and estimated 30mL remaining, approximately 3 hours remain until pt can discharge per order. Patient educated on this delay, but adamant she will not stay for one more hour, only 30 minutes. Attempted to educate patient on importance of completing entire bag of chemotherapy without success. Barbarann, MD and Lanny, MD notified of situation.  Addendum 1218, patient requested to be disconnected from chemo for discharge. Chemo stopped with 25mL remaining in bag/line. Heparin  flush administered, green caps applied, discharge instructions reviewed. Patient elected to pick up medications from the outpatient pharmacy herself.

## 2024-12-01 NOTE — Assessment & Plan Note (Addendum)
 Creatinine peaked at 4.52, GFR 10 Needs to work on ongoing hydration Oncologist (Dr. Lanny) discussed with nephrologist (Dr. Tobie) on 12/12  and subsequently PICC line placed on nondominant arm for initiation of chemotherapy

## 2024-12-01 NOTE — Assessment & Plan Note (Addendum)
 Presented with acute rectal bleeding No more frank hematochezia and blood clots, states that she just has a little bit of pink blood when wiping Hb downtrending from ABLA, thus far has been transfused 4 units PRBC; Hgb appears to be stable, she has also received IV iron  and will be arranged for more IV iron  as an outpatient Flex sig showed moderately differentiated adenocarcinoma CEA 26.2 No role for surgery at this time; recommended for outpatient surgery f/u in 4 weeks GI/radiation oncology/medical oncology following As needed narcotics for anal pain, also on stool softeners to minimize symptoms Started on chemotherapy, completed initial course today Also on concurrent radiation through 01/07/2025 (appears to be roughly 27 treatments based on the calendar provided) Ongoing rectal pain noted, will discharge with PO oxycodoone Dr. Lanny has continued to consult and recommends leaving PICC line at time of dc for ongoing use with continued chemotherapy

## 2024-12-01 NOTE — Progress Notes (Signed)
 Writer went to draw morning labs and asked pt if this is a good time to change her dressing. Pt stated she will get it done prior to discharge.

## 2024-12-01 NOTE — Assessment & Plan Note (Addendum)
 BP elevated to 155/96, improved to 143/86 today Continue amlodipine , hydralazine , and metoprolol 

## 2024-12-01 NOTE — Discharge Summary (Signed)
 " Physician Discharge Summary   Patient: Mary Stephens MRN: [id number] DOB: October 05, 1956  Admit date:     11/20/2024  Discharge date: 12/01/2024  Discharge Physician: Delon Herald   PCP: Cloria Annabella CROME, DO   Recommendations at discharge:   You are being discharged with a PICC line for ongoing cancer treatments; keep line clean and dry Follow up as scheduled for radiation oncology Follow up with Dr. Lanny as scheduled for ongoing chemotherapy Follow up with Dr. Cloria in 1-2 weeks; call for an appointment Follow up in 4 weeks with general surgery Hold aspirin  and Lasix  for now You are being prescribed a limited number of narcotic pain pills and anxiety pills; take as directed and do not drive or make important decisions while taking this medication  You are being referred for outpatient IV iron  therapy and should be contacted with an appointment  Discharge Diagnoses: Principal Problem:   Acute GI bleeding Active Problems:   History of hepatitis C   DM (diabetes mellitus), type 2 (HCC)   Essential hypertension   HLD (hyperlipidemia)   Acute renal failure superimposed on stage 4 chronic kidney disease (HCC)   History of aortic dissection   GERD (gastroesophageal reflux disease)   Rectal bleeding   Symptomatic anemia   Rectal mass   Rectal adenocarcinoma (HCC)   Otalgia of left ear    Hospital Course: 68yo with h/o stage 4 CKD, HTN, HLD, and aortic dissection s/p repair in 2021 who presented on 12/9 with rectal bleeding.  Flex sig on 12/11 showed anorectal mass that is biopsy positive for adenocarcinoma.  She was transferred to [facility] for initiation of concurrent chemo/radiation which was started on 12/16.  Assessment and Plan:  Assessment & Plan Acute GI bleeding Rectal adenocarcinoma (HCC) Symptomatic anemia Presented with acute rectal bleeding No more frank hematochezia and blood clots, states that she just has a little bit of pink blood when wiping Hb downtrending from  ABLA, thus far has been transfused 4 units PRBC; Hgb appears to be stable, she has also received IV iron  and will be arranged for more IV iron  as an outpatient Flex sig showed moderately differentiated adenocarcinoma CEA 26.2 No role for surgery at this time; recommended for outpatient surgery f/u in 4 weeks GI/radiation oncology/medical oncology following As needed narcotics for anal pain, also on stool softeners to minimize symptoms Started on chemotherapy, completed initial course today Also on concurrent radiation through 01/07/2025 (appears to be roughly 27 treatments based on the calendar provided) Ongoing rectal pain noted, will discharge with PO oxycodoone Dr. Lanny has continued to consult and recommends leaving PICC line at time of dc for ongoing use with continued chemotherapy Otalgia of left ear Reported onset of L ear pain starting 12/17 No obvious external trauma Unremarkable evaluation with otoscope at bedside on 12/18 Ongoing analgesia encouraged Resolved History of hepatitis C Completed treatment with Harvoni  per prior notes  DM (diabetes mellitus), type 2 (HCC) Diet controlled Good control per last A1c Essential hypertension BP elevated to 155/96, improved to 143/86 today Continue amlodipine , hydralazine , and metoprolol  HLD (hyperlipidemia) Continue atorvastatin  Acute renal failure superimposed on stage 4 chronic kidney disease (HCC) Creatinine peaked at 4.52, GFR 10 Needs to work on ongoing hydration Oncologist (Dr. Lanny) discussed with nephrologist (Dr. Tobie) on 12/12  and subsequently PICC line placed on nondominant arm for initiation of chemotherapy History of aortic dissection S/p hemiarch repair in 2021 Hold ASA for now       Consultants: GI Surgery  Radiation Oncology Oncology   Procedures: Flex sig 12/11 PICC line 12/12   Antibiotics: None  Pain control - [place]  Controlled Substance Reporting System database was reviewed. and patient  was instructed, not to drive, operate heavy machinery, perform activities at heights, swimming or participation in water activities or provide baby-sitting services while on Pain, Sleep and Anxiety Medications; until their outpatient Physician has advised to do so again. Also recommended to not to take more than prescribed Pain, Sleep and Anxiety Medications.   Disposition: Home Diet recommendation:  Regular diet DISCHARGE MEDICATION: Allergies as of 12/01/2024   No Known Allergies      Medication List     PAUSE taking these medications    aspirin  EC 81 MG tablet Wait to take this until your doctor or other care provider tells you to start again. Take 1 tablet (81 mg total) by mouth daily. Swallow whole.   furosemide  40 MG tablet Wait to take this until your doctor or other care provider tells you to start again. Commonly known as: LASIX  Take 1 tablet (40 mg total) by mouth daily.       TAKE these medications    acetaminophen  500 MG tablet Commonly known as: TYLENOL  Take 500-1,000 mg by mouth every 8 (eight) hours as needed (for pain).   amLODipine  10 MG tablet Commonly known as: NORVASC  Take 1 tablet (10 mg total) by mouth daily.   atorvastatin  40 MG tablet Commonly known as: LIPITOR Take 40 mg by mouth at bedtime.   gabapentin  300 MG capsule Commonly known as: NEURONTIN  Take 300 mg by mouth daily.   hydrALAZINE  100 MG tablet Commonly known as: APRESOLINE  Take 100 mg by mouth 3 (three) times daily.   LORazepam  1 MG tablet Commonly known as: ATIVAN  Take 1 tablet (1 mg total) by mouth every 4 (four) hours as needed for anxiety.   metoprolol  tartrate 50 MG tablet Commonly known as: LOPRESSOR  Take 50 mg by mouth 2 (two) times daily.   ondansetron  4 MG disintegrating tablet Commonly known as: ZOFRAN -ODT Take 1 tablet (4 mg total) by mouth every 8 (eight) hours as needed for nausea or vomiting.   oxyCODONE  5 MG immediate release tablet Commonly known as: Oxy  IR/ROXICODONE  Take 1 tablet (5 mg total) by mouth every 6 (six) hours as needed for moderate pain (pain score 4-6).   polyethylene glycol 17 g packet Commonly known as: MIRALAX  / GLYCOLAX  Take 17 g by mouth daily as needed for mild constipation.   senna-docusate 8.6-50 MG tablet Commonly known as: Senokot-S Take 1 tablet by mouth 2 (two) times daily.        Follow-up Information     [facility] Follow up in 1 month(s).   Specialty: General Surgery Why: colorectal surgeons Contact information: [address] [place]  [address] [phone]        Lanny Callander, MD Follow up.   Specialties: Hematology, Oncology Contact information: [address] [place] [address] [phone]         Dewey Rush, MD Follow up.   Specialty: Radiation Oncology Contact information: [address] [place] [address] [phone]         Cloria Riggs L, DO. Schedule an appointment as soon as possible for a visit in 1 week(s).   Specialty: Geriatric Medicine Contact information: [address] [place] [address] [phone]                Discharge  Exam:   Subjective: Feeling fine today, ready to go home.  Acknowledges need to eat/drink more and promises to work on this.   Objective: Vitals:   12/01/24 0448 12/01/24 0959  BP: 118/70 (!) 143/86  Pulse: 66 65  Resp: 14   Temp: 98.3 F (36.8 C)   SpO2: 96%     Intake/Output Summary (Last 24 hours) at 12/01/2024 1007 Last data filed at 11/30/2024 2113 Gross per 24 hour  Intake 464 ml  Output --  Net 464 ml   Filed Weights   11/20/24 1728 11/21/24 1440  Weight: 61.2 kg 61.1 kg    Exam:  General:  Appears calm and comfortable and is in NAD, frail Eyes:  normal lids, iris ENT:  grossly normal hearing, lips & tongue, mmm Cardiovascular:  RRR. No LE edema.  Respiratory:   CTA bilaterally with no wheezes/rales/rhonchi.  Normal  respiratory effort. Abdomen:  soft, NT, ND Skin:  no rash or induration seen on limited exam Musculoskeletal:  grossly normal tone BUE/BLE, good ROM, no bony abnormality Psychiatric:  grossly normal mood and affect, speech fluent and appropriate, AOx3 Neurologic:  CN 2-12 grossly intact, moves all extremities in coordinated fashion  Data Reviewed: I have reviewed the patient's lab results since admission.  Pertinent labs for today include:   Na++ 146, not clinically significant BUN 27/Creatinine 2.52/GFR 13, at/near baseline WBC 5.7 Hgb 8.5, stable    Condition at discharge: improving  The results of significant diagnostics from this hospitalization (including imaging, microbiology, ancillary and laboratory) are listed below for reference.   Imaging Studies: CT CHEST WO CONTRAST Result Date: 11/23/2024 EXAM: CT CHEST WITHOUT CONTRAST 11/23/2024 07:55:44 PM TECHNIQUE: CT of the chest was performed without the administration of intravenous contrast. Multiplanar reformatted images are provided for review. Automated exposure control, iterative reconstruction, and/or weight based adjustment of the mA/kV was utilized to reduce the radiation dose to as low as reasonably achievable. COMPARISON: 04/15/2024 CLINICAL HISTORY: Rectal cancer, staging. 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 Cardiomegaly. Small pericardial effusion. Mild 3-vessel coronary atherosclerosis. Aorta: Suspected stable thoracic aortic dissection (images 23 and 61), poorly visualized on unenhanced CT. Associated stable thoracic aortic aneurysm at the level of the transverse aortic arch measuring 4.4 cm (image 19). Central airways: The central airways are clear. Vessels: Left PICC terminates in upper SVC. LYMPH NODES: No mediastinal, hilar or axillary lymphadenopathy. LUNGS AND PLEURA: Scattered calcified granulomata, benign. Two left upper lobe pulmonary nodules measuring up to 3  mm (images 50 to 51), unchanged, benign. Mild linear scarring/atelectasis in bilateral lower lobes. No pleural effusion or pneumothorax. SOFT TISSUES/BONES: Median sternotomy. No acute abnormality of the bones or soft tissues. UPPER ABDOMEN: Limited images of the upper abdomen demonstrate an atrophic left kidney. 2.1 cm right adrenal nodule compatible with benign adrenal adenoma. IMPRESSION: 1. No evidence of metastatic disease. Two stable 3 mm left upper lobe nodules, benign. 2. Status post aortic valve/root repair. Suspected stable thoracic aortic dissection with associated aneurysm, as above. 3. Cardiomegaly with a small pericardial effusion. Electronically signed by: Pinkie Pebbles MD 11/23/2024 09:44 PM EST RP Workstation: [id number]   US  EKG SITE RITE Result Date: 11/23/2024 If Site Rite image not attached, placement could not be confirmed due to current cardiac rhythm.  MR PELVIS WO CM RECTAL CA STAGING Result Date: 11/23/2024 CLINICAL DATA:  New diagnosis of anal cancer. EXAM: MRI PELVIS WITHOUT CONTRAST TECHNIQUE: Multiplanar multisequence MR imaging of the pelvis was performed. No intravenous  contrast was administered. Ultrasound gel was administered per rectum to optimize tumor evaluation. COMPARISON:  CTA of 11/21/2024 FINDINGS: TUMOR LOCATION Tumor distance from Anal Verge/Skin surface: Not applicable. Appears to protrude from the anus, including on image 32/5. Tumor distance to Internal Anal sphincter: Not applicable. TUMOR DESCRIPTION Circumferential extent: Circumferential, including on image 31/5. Tumor Size and volume: 5.3 by 2.7 x 2.8 cm on images 18/3 and 31/5. Volume 20.8 cc T - CATEGORY Based on lesion size and presumed anal primary, T3. Levator Ani Involvement: Present, especially eccentric right and posteriorly on image 31/5. N - CATEGORY Right inguinal adenopathy at 1.5 cm on image 15/5. This node is hyperenhancing on image 185/9 of the prior CTA. A right external iliac 9 mm node  on image 8/5 is also hyperenhancing on the prior CTA. N1b. Other: No significant free fluid. A left ovarian T2 hypointense 2.5 x 2.0 cm on image 13/5. No bowel obstruction. The patient halted the exam after the initial T2 weighted imaging was performed. IMPRESSION: Exam was halted at patient request after the initial T2 weighted images. Anal primary, staged as T3, N1b as detailed above. Involvement of the levator ani, as detailed above. 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 Consider nonemergent outpatient follow-up pre and postcontrast MRI at 3 months versus ultrasound surveillance. Electronically Signed   By: Rockey Kilts M.D.   On: 11/23/2024 13:57   CT ANGIO GI BLEED Result Date: 11/21/2024 CLINICAL DATA:  GI bleed. EXAM: CTA ABDOMEN AND PELVIS WITHOUT AND WITH CONTRAST TECHNIQUE: Multidetector CT imaging of the abdomen and pelvis was performed using the standard protocol during bolus administration of intravenous contrast. Multiplanar reconstructed images and MIPs were obtained and reviewed to evaluate the vascular anatomy. RADIATION DOSE REDUCTION: This exam was performed according to the departmental dose-optimization program which includes automated exposure control, adjustment of the mA and/or kV according to patient size and/or use of iterative reconstruction technique. CONTRAST:  75mL OMNIPAQUE  IOHEXOL  350 MG/ML SOLN COMPARISON:  CT abdomen pelvis dated 12/02/2020. FINDINGS: VASCULAR Aorta: Old dissection flap in the abdominal aorta. There is mild atherosclerotic calcification of the aorta. The true and false lumens appear patent. Celiac: The celiac artery is patent. SMA: There is extension of the dissection flap into the SMA. The SMA is patent. Renals: The right renal arteries are patent. The left linear artery is narrowed and appears scarred with diminished flow. IMA: The IMA is patent. Inflow: Extension of the dissection flap into the left common iliac and external iliac  arteries. The iliac arteries are patent bilaterally. Proximal Outflow: The visualized proximal outflow is patent. Veins: The IVC is unremarkable. The SMV, splenic vein, and main portal vein are patent. No portal venous gas. Review of the MIP images confirms the above findings. NON-VASCULAR Lower chest: Small bilateral pleural effusions. Partially visualized pericardial effusion measuring 7 mm in thickness. No intra-abdominal free air.  Small free fluid in the pelvis. Hepatobiliary: The liver is unremarkable. The gallbladder is contracted. Small gallstones suspected in the gallbladder. Pancreas: Unremarkable. No pancreatic ductal dilatation or surrounding inflammatory changes. Spleen: Normal in size without focal abnormality. Adrenals/Urinary Tract: Bilateral adrenal thickening/hyperplasia or adenoma similar to prior CT. Left renal parenchyma atrophy. Several left renal cysts. A 12 mm high attenuating lesion from the posterior lower pole of the left kidney as well as a 2 cm high attenuating lesion from the upper pole of the left kidney are not characterized on this CT but were present on the prior CT, likely proteinaceous  cysts. No hydronephrosis on either side. The urinary bladder is grossly unremarkable. Stomach/Bowel: Moderate stool throughout the colon. No bowel obstruction or active inflammation. No evidence of active GI bleed. The appendix is normal. Lymphatic: No adenopathy. Reproductive: Hysterectomy. Other: None Musculoskeletal: No acute or significant osseous findings. IMPRESSION: 1. No evidence of active GI bleed. 2. Old dissection flap in the abdominal aorta with extension of the dissection flap into the SMA and left common iliac and external iliac arteries. The aorta and mesenteric vessels are patent. 3. Small bilateral pleural effusions. 4. Partially visualized pericardial effusion. 5. No bowel obstruction. Normal appendix. 6.  Aortic Atherosclerosis (ICD10-I70.0). Electronically Signed   By: Vanetta Chou M.D.   On: 11/21/2024 19:03    Microbiology: Results for orders placed or performed during the hospital encounter of 04/30/24  Resp panel by RT-PCR (RSV, Flu A&B, Covid) Anterior Nasal Swab     Status: None   Collection Time: 04/30/24  4:16 PM   Specimen: Anterior Nasal Swab  Result Value Ref Range Status   SARS Coronavirus 2 by RT PCR NEGATIVE NEGATIVE Final   Influenza A by PCR NEGATIVE NEGATIVE Final   Influenza B by PCR NEGATIVE NEGATIVE Final    Comment: (NOTE) The Xpert Xpress SARS-CoV-2/FLU/RSV plus assay is intended as an aid in the diagnosis of influenza from Nasopharyngeal swab specimens and should not be used as a sole basis for treatment. Nasal washings and aspirates are unacceptable for Xpert Xpress SARS-CoV-2/FLU/RSV testing.  Fact Sheet for Patients: bloggercourse.com  Fact Sheet for Healthcare Providers: seriousbroker.it  This test is not yet approved or cleared by the [place]  FDA and has been authorized for detection and/or diagnosis of SARS-CoV-2 by FDA under an Emergency Use Authorization (EUA). This EUA will remain in effect (meaning this test can be used) for the duration of the COVID-19 declaration under Section 564(b)(1) of the Act, 21 U.S.C. section 360bbb-3(b)(1), unless the authorization is terminated or revoked.     Resp Syncytial Virus by PCR NEGATIVE NEGATIVE Final    Comment: (NOTE) Fact Sheet for Patients: bloggercourse.com  Fact Sheet for Healthcare Providers: seriousbroker.it  This test is not yet approved or cleared by the [place]  FDA and has been authorized for detection and/or diagnosis of SARS-CoV-2 by FDA under an Emergency Use Authorization (EUA). This EUA will remain in effect (meaning this test can be used) for the duration of the COVID-19 declaration under Section 564(b)(1) of the Act, 21 U.S.C. section  360bbb-3(b)(1), unless the authorization is terminated or revoked.  Performed at [facility], [address], [place], [place] [address]     Labs: CBC: Recent Labs  Lab 11/27/24 1745 11/28/24 0523 11/29/24 0454 11/30/24 0451 12/01/24 0558  WBC 5.9 5.2 6.1 5.2 5.7  NEUTROABS  --  3.6  --   --   --   HGB 7.8* 8.0* 8.8* 8.5* 8.5*  HCT 24.7* 25.3* 27.6* 27.0* 26.9*  MCV 97.6 96.9 95.8 97.1 98.2  PLT 147* 161 181 182 201   Basic Metabolic Panel: Recent Labs  Lab 11/25/24 0400 11/28/24 0523 11/30/24 0451 12/01/24 0558  NA 145 147* 143 146*  K 4.1 3.8 3.4* 3.9  CL 111 113* 110 112*  CO2 25 25 24 25   GLUCOSE 108* 81 104* 89  BUN 34* 26* 24* 27*  CREATININE 3.92* 3.25* 3.13* 3.52*  CALCIUM  8.2* 8.7* 9.0 8.8*   Liver Function Tests: Recent Labs  Lab 11/28/24 0523  AST 15  ALT 6  ALKPHOS 57  BILITOT 0.4  PROT 5.6*  ALBUMIN  3.7   CBG: No results for input(s): GLUCAP in the last 168 hours.  Discharge time spent: greater than 30 minutes.  Signed: Delon Herald, MD [facility] 12/01/2024 "

## 2024-12-01 NOTE — Assessment & Plan Note (Addendum)
 S/p hemiarch repair in 2021 Hold ASA for now

## 2024-12-01 NOTE — Progress Notes (Signed)
 Pt informed that she would discharge with picc line per provider's note. Pt also informed that the dressing is over due to be changed per policy and asked if I could do that as she is awake at this time. Pt declined and stated she would like for it to be completed later as she is not comfortable at the moment. Pt informed of potential risk of infection and verbalized understanding.

## 2024-12-01 NOTE — Assessment & Plan Note (Addendum)
 Continue atorvastatin 

## 2024-12-02 ENCOUNTER — Ambulatory Visit
Admission: RE | Admit: 2024-12-02 | Discharge: 2024-12-02 | Payer: Medicare (Managed Care) | Attending: Radiation Oncology

## 2024-12-02 ENCOUNTER — Other Ambulatory Visit: Payer: Self-pay

## 2024-12-02 DIAGNOSIS — C20 Malignant neoplasm of rectum: Secondary | ICD-10-CM | POA: Diagnosis present

## 2024-12-02 DIAGNOSIS — N184 Chronic kidney disease, stage 4 (severe): Secondary | ICD-10-CM | POA: Diagnosis not present

## 2024-12-02 DIAGNOSIS — Z510 Encounter for antineoplastic radiation therapy: Secondary | ICD-10-CM | POA: Diagnosis present

## 2024-12-02 DIAGNOSIS — E1122 Type 2 diabetes mellitus with diabetic chronic kidney disease: Secondary | ICD-10-CM | POA: Diagnosis not present

## 2024-12-02 DIAGNOSIS — I129 Hypertensive chronic kidney disease with stage 1 through stage 4 chronic kidney disease, or unspecified chronic kidney disease: Secondary | ICD-10-CM | POA: Diagnosis not present

## 2024-12-02 DIAGNOSIS — Z5111 Encounter for antineoplastic chemotherapy: Secondary | ICD-10-CM | POA: Diagnosis not present

## 2024-12-02 LAB — RAD ONC ARIA SESSION SUMMARY
Course Elapsed Days: 5
Plan Fractions Treated to Date: 5
Plan Prescribed Dose Per Fraction: 1.8 Gy
Plan Total Fractions Prescribed: 25
Plan Total Prescribed Dose: 45 Gy
Reference Point Dosage Given to Date: 9 Gy
Reference Point Session Dosage Given: 1.8 Gy
Session Number: 5

## 2024-12-02 NOTE — Assessment & Plan Note (Deleted)
 [id number], stage IIIB, G2, MMR normal - Diagnosed on November 22, 2024.  Patient presented with severe rectal bleeding to the hospital, required multiple blood transfusion.  Endoscopy showed a mass in the anal canal and extending to the distal rectum, they confirmed adenocarcinoma, MMR normal. -Started concurrent chemoradiation with 5-FU continuous infusion in the hospital on 11/27/2024.

## 2024-12-03 ENCOUNTER — Other Ambulatory Visit: Payer: Self-pay

## 2024-12-03 ENCOUNTER — Inpatient Hospital Stay: Payer: Medicare (Managed Care)

## 2024-12-03 ENCOUNTER — Inpatient Hospital Stay: Payer: Medicare (Managed Care) | Admitting: Hematology

## 2024-12-03 ENCOUNTER — Inpatient Hospital Stay: Payer: Medicare (Managed Care) | Attending: Hematology

## 2024-12-03 ENCOUNTER — Ambulatory Visit
Admission: RE | Admit: 2024-12-03 | Discharge: 2024-12-03 | Payer: Medicare (Managed Care) | Attending: Radiation Oncology

## 2024-12-03 ENCOUNTER — Other Ambulatory Visit (HOSPITAL_COMMUNITY): Payer: Self-pay

## 2024-12-03 VITALS — BP 132/81 | HR 65 | Temp 97.8°F | Resp 15 | Wt 133.5 lb

## 2024-12-03 DIAGNOSIS — I129 Hypertensive chronic kidney disease with stage 1 through stage 4 chronic kidney disease, or unspecified chronic kidney disease: Secondary | ICD-10-CM | POA: Insufficient documentation

## 2024-12-03 DIAGNOSIS — N184 Chronic kidney disease, stage 4 (severe): Secondary | ICD-10-CM | POA: Insufficient documentation

## 2024-12-03 DIAGNOSIS — Z510 Encounter for antineoplastic radiation therapy: Secondary | ICD-10-CM | POA: Diagnosis not present

## 2024-12-03 DIAGNOSIS — Z79899 Other long term (current) drug therapy: Secondary | ICD-10-CM | POA: Insufficient documentation

## 2024-12-03 DIAGNOSIS — Z7982 Long term (current) use of aspirin: Secondary | ICD-10-CM | POA: Insufficient documentation

## 2024-12-03 DIAGNOSIS — C20 Malignant neoplasm of rectum: Secondary | ICD-10-CM

## 2024-12-03 DIAGNOSIS — E1122 Type 2 diabetes mellitus with diabetic chronic kidney disease: Secondary | ICD-10-CM | POA: Insufficient documentation

## 2024-12-03 LAB — CMP (CANCER CENTER ONLY)
ALT: 7 U/L (ref 0–44)
AST: 17 U/L (ref 15–41)
Albumin: 4.1 g/dL (ref 3.5–5.0)
Alkaline Phosphatase: 51 U/L (ref 38–126)
Anion gap: 11 (ref 5–15)
BUN: 35 mg/dL — ABNORMAL HIGH (ref 8–23)
CO2: 22 mmol/L (ref 22–32)
Calcium: 8.4 mg/dL — ABNORMAL LOW (ref 8.9–10.3)
Chloride: 110 mmol/L (ref 98–111)
Creatinine: 3.75 mg/dL — ABNORMAL HIGH (ref 0.44–1.00)
GFR, Estimated: 12 mL/min — ABNORMAL LOW
Glucose, Bld: 115 mg/dL — ABNORMAL HIGH (ref 70–99)
Potassium: 4 mmol/L (ref 3.5–5.1)
Sodium: 144 mmol/L (ref 135–145)
Total Bilirubin: 0.3 mg/dL (ref 0.0–1.2)
Total Protein: 6.1 g/dL — ABNORMAL LOW (ref 6.5–8.1)

## 2024-12-03 LAB — CBC WITH DIFFERENTIAL (CANCER CENTER ONLY)
Abs Immature Granulocytes: 0.02 K/uL (ref 0.00–0.07)
Basophils Absolute: 0 K/uL (ref 0.0–0.1)
Basophils Relative: 0 %
Eosinophils Absolute: 0.1 K/uL (ref 0.0–0.5)
Eosinophils Relative: 2 %
HCT: 25.7 % — ABNORMAL LOW (ref 36.0–46.0)
Hemoglobin: 8.4 g/dL — ABNORMAL LOW (ref 12.0–15.0)
Immature Granulocytes: 0 %
Lymphocytes Relative: 10 %
Lymphs Abs: 0.5 K/uL — ABNORMAL LOW (ref 0.7–4.0)
MCH: 31.2 pg (ref 26.0–34.0)
MCHC: 32.7 g/dL (ref 30.0–36.0)
MCV: 95.5 fL (ref 80.0–100.0)
Monocytes Absolute: 0.4 K/uL (ref 0.1–1.0)
Monocytes Relative: 8 %
Neutro Abs: 4.1 K/uL (ref 1.7–7.7)
Neutrophils Relative %: 80 %
Platelet Count: 213 K/uL (ref 150–400)
RBC: 2.69 MIL/uL — ABNORMAL LOW (ref 3.87–5.11)
RDW: 17 % — ABNORMAL HIGH (ref 11.5–15.5)
WBC Count: 5.1 K/uL (ref 4.0–10.5)
nRBC: 0 % (ref 0.0–0.2)

## 2024-12-03 LAB — RAD ONC ARIA SESSION SUMMARY
Course Elapsed Days: 6
Plan Fractions Treated to Date: 6
Plan Prescribed Dose Per Fraction: 1.8 Gy
Plan Total Fractions Prescribed: 25
Plan Total Prescribed Dose: 45 Gy
Reference Point Dosage Given to Date: 10.8 Gy
Reference Point Session Dosage Given: 1.8 Gy
Session Number: 6

## 2024-12-03 MED ORDER — SODIUM CHLORIDE 0.9 % IV SOLN
225.0000 mg/m2/d | INTRAVENOUS | Status: DC
  Administered 2024-12-03: 1500 mg via INTRAVENOUS
  Filled 2024-12-03: qty 30

## 2024-12-03 MED ORDER — SODIUM CHLORIDE 0.9 % IV SOLN: INTRAVENOUS | Status: DC

## 2024-12-03 MED ORDER — SODIUM CHLORIDE 0.9% FLUSH: 10.0000 mL | INTRAVENOUS | Status: DC | PRN

## 2024-12-03 NOTE — Progress Notes (Signed)
 " [facility]   [phone]) [phone] Fax:([phone]   Clinic Follow up Note   Patient Care Team: Cloria Annabella CROME, DO as PCP - General (Geriatric Medicine) Lynwood Schilling, MD as PCP - Cardiology (Cardiology)  Date of Service:  12/03/2024  CHIEF COMPLAINT: f/u of rectal cancer  CURRENT THERAPY:  Concurrent chemoradiation with 5-FU pump infusion  Oncology History   Rectal adenocarcinoma (HCC) [id number], stage IIIB, G2, MMR normal - Diagnosed on November 22, 2024.  Patient presented with severe rectal bleeding to the hospital, required multiple blood transfusion.  Endoscopy showed a mass in the anal canal and extending to the distal rectum, they confirmed adenocarcinoma, MMR normal. -Started concurrent chemoradiation with 5-FU continuous infusion in the hospital on 11/27/2024.   Assessment & Plan Rectal cancer undergoing concurrent chemoradiation She is receiving intravenous chemotherapy 5-fu via PICC line and daily radiation, with a modified schedule this week due to the holiday. She reports mild rectal bleeding but no significant complications and is tolerating therapy overall. Chemotherapy administration and vascular access were selected based on underlying chronic kidney disease. No acute complications from the PICC line or therapy. - Continued concurrent chemoradiation as scheduled, with adjustment for holiday (shortened week). - Ordered removal of chemotherapy pump on Friday instead of Saturday due to radiation schedule. - Continued chemotherapy plan. - Arranged weekly follow-up visits, to be seen by nurse practitioner during provider's absence. - Instructed to maintain PICC line until completion of intravenous chemotherapy (anticipated 4-6 months). - Instructed to present weekly for PICC line dressing changes and flushing. - Provided instructions to keep PICC line dry and protected during showering.  Adverse effects of chemotherapy (fatigue, leg  weakness) She experiences significant leg weakness and fatigue, likely multifactorial due to recent hospitalization with immobility and ongoing chemoradiation. No falls, significant nausea, or other acute side effects reported. Symptoms are managed conservatively. - Advised to increase physical activity at home, including walking, treadmill, or stationary bike as tolerated, to regain strength. - Encouraged adequate nutrition and activity to manage fatigue. - Confirmed antiemetic medication available at home.  Chronic kidney disease stage IV Chronic kidney disease influenced the decision to use a PICC line rather than a port for chemotherapy administration, as recommended by nephrology. No acute issues related to kidney disease at this visit. - Maintained use of PICC line for chemotherapy administration per nephrology recommendation.  Plan - Lab reviewed, adequate for treatment, will proceed to week to chemo and radiation.  Due to the holiday, we will shorten her 5-FU infusion to 4 days a week with pump DC on Friday. - Lab and follow-up next Monday, before week 3 chemoinfusion.   SUMMARY OF ONCOLOGIC HISTORY: Oncology History  Rectal adenocarcinoma (HCC)  11/22/2024 Cancer Staging   Staging form: Colon and Rectum, AJCC 8th Edition - Clinical stage from 11/22/2024: Stage IIIB (cT3, cN1b, cM0) - Signed by Lanny Callander, MD on 12/02/2024 Stage prefix: Initial diagnosis Histologic grade (G): G2 Histologic grading system: 4 grade system   11/23/2024 Initial Diagnosis   Rectal adenocarcinoma (HCC)   11/27/2024 -  Chemotherapy   Patient is on Treatment Plan : RECTUM 5FU IVCI D1-5 (225) + XRT        Discussed the use of AI scribe software for clinical note transcription with the patient, who gave verbal consent to proceed.  History of Present Illness Mary Stephens is a 68 year old female with rectal cancer undergoing concurrent chemoradiation who presents for follow-up during active  treatment.  She is receiving intravenous  chemotherapy via PICC line and scheduled radiation, with three radiation sessions this week due to the holiday.  Over the past weekend, she noted mild rectal bleeding with a small amount of blood and no significant hemorrhage. Bowel movements are regular without change. She denies nausea and received antiemetic medication yesterday.  She reports significant leg weakness and fatigue, which she relates to recent hospitalization, immobility, and ongoing chemoradiation. She stays active at home but notes her legs quickly become weak, which is worse than her baseline. She has had no falls.  Her chronic kidney disease prompted nephrology to recommend a PICC line for chemotherapy administration. No acute kidney issues were addressed today.     All other systems were reviewed with the patient and are negative.  MEDICAL HISTORY:  Past Medical History:  Diagnosis Date   Anxiety    Arthritis    Cataract    forming- very small   Colon polyps    Depression    Diabetes mellitus without complication (HCC)    off meds for diabetes- diet controlled   GERD (gastroesophageal reflux disease)    H/O aortic dissection 2021   Hepatitis C    Hyperlipidemia    Hypertension    Neuropathy    feet    SURGICAL HISTORY: Past Surgical History:  Procedure Laterality Date   COLONOSCOPY  ?2001,2011?   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 (N/A)  12/02/2020   TEE WITHOUT CARDIOVERSION  12/02/2020   Procedure: TRANSESOPHAGEAL ECHOCARDIOGRAM (TEE);  Surgeon: Shyrl Linnie KIDD, MD;  Location: [facility] OR;  Service: Vascular;;   TRANSESOPHAGEAL ECHOCARDIOGRAM (CATH LAB) N/A 04/17/2024   Procedure: TRANSESOPHAGEAL ECHOCARDIOGRAM;  Surgeon: Shlomo Wilbert SAUNDERS, MD;  Location: [facility] INVASIVE CV LAB;  Service: Cardiovascular;  Laterality: N/A;    I have reviewed the social history and family history with the patient and they are unchanged from previous note.  ALLERGIES:  has no known allergies.  MEDICATIONS:  Current Outpatient Medications  Medication Sig Dispense Refill   acetaminophen  (TYLENOL ) 500 MG tablet Take 500-1,000 mg by mouth every 8 (eight) hours as needed (for pain).     amLODipine  (NORVASC ) 10 MG tablet Take 1 tablet (10 mg total) by mouth daily. 30 tablet 1   [Paused] aspirin  EC 81 MG tablet Take 1 tablet (81 mg total) by mouth daily. Swallow whole. 90 tablet 3   atorvastatin  (LIPITOR) 40 MG tablet Take 40 mg by mouth at bedtime.     [Paused] furosemide  (LASIX ) 40 MG tablet Take 1 tablet (40 mg total) by mouth daily. 30 tablet 1   gabapentin  (NEURONTIN ) 300 MG capsule Take 300 mg by mouth daily.     hydrALAZINE  (APRESOLINE ) 100 MG tablet Take 100 mg by mouth 3 (three) times daily.     LORazepam  (ATIVAN ) 1 MG tablet Take 1 tablet (  1 mg total) by mouth every 4 (four) hours as needed for anxiety. 30 tablet 0   metoprolol  tartrate (LOPRESSOR ) 50 MG tablet Take 50 mg by mouth 2 (two) times daily.     ondansetron  (ZOFRAN -ODT) 4 MG disintegrating tablet Take 1 tablet (4 mg total) by mouth every 8 (eight) hours as needed for nausea or vomiting. 20 tablet 0   oxyCODONE  (OXY IR/ROXICODONE ) 5 MG immediate release tablet Take 1 tablet (5 mg  total) by mouth every 6 (six) hours as needed for moderate pain (pain score 4-6). 30 tablet 0   polyethylene glycol powder (GLYCOLAX /MIRALAX ) 17 GM/SCOOP powder Take 17 g by mouth daily as needed for mild constipation. Dissolve 1 capful (17g) in 4-8 ounces of liquid and take by mouth daily. 238 g 0   senna-docusate (SENOKOT-S) 8.6-50 MG tablet Take 1 tablet by mouth 2 (two) times daily. 60 tablet 0   No current facility-administered medications for this visit.    PHYSICAL EXAMINATION: ECOG PERFORMANCE STATUS: 1 - Symptomatic but completely ambulatory  There were no vitals filed for this visit. Wt Readings from Last 3 Encounters:  12/03/24 133 lb 8 oz (60.6 kg)  11/21/24 134 lb 11.2 oz (61.1 kg)  05/31/24 135 lb (61.2 kg)     GENERAL:alert, no distress and comfortable SKIN: skin color, texture, turgor are normal, no rashes or significant lesions EYES: normal, Conjunctiva are pink and non-injected, sclera clear NECK: supple, thyroid  normal size, non-tender, without nodularity LYMPH:  no palpable lymphadenopathy in the cervical, axillary  LUNGS: clear to auscultation and percussion with normal breathing effort HEART: regular rate & rhythm and no murmurs and no lower extremity edema ABDOMEN:abdomen soft, non-tender and normal bowel sounds Musculoskeletal:no cyanosis of digits and no clubbing  NEURO: alert & oriented x 3 with fluent speech, no focal motor/sensory deficits  Physical Exam    LABORATORY DATA:  I have reviewed the data as listed    Latest Ref Rng & Units 12/03/2024   12:40 PM 12/01/2024    5:58 AM 11/30/2024    4:51 AM  CBC  WBC 4.0 - 10.5 K/uL 5.1  5.7  5.2   Hemoglobin 12.0 - 15.0 g/dL 8.4  8.5  8.5   Hematocrit 36.0 - 46.0 % 25.7  26.9  27.0   Platelets 150 - 400 K/uL 213  201  182         Latest Ref Rng & Units 12/03/2024   12:40 PM 12/01/2024    5:58 AM 11/30/2024    4:51 AM  CMP  Glucose 70 - 99 mg/dL 884  89  895   BUN 8 - 23 mg/dL 35  27  24    Creatinine 0.44 - 1.00 mg/dL 6.24  6.47  6.86   Sodium 135 - 145 mmol/L 144  146  143   Potassium 3.5 - 5.1 mmol/L 4.0  3.9  3.4   Chloride 98 - 111 mmol/L 110  112  110   CO2 22 - 32 mmol/L 22  25  24    Calcium  8.9 - 10.3 mg/dL 8.4  8.8  9.0   Total Protein 6.5 - 8.1 g/dL 6.1     Total Bilirubin 0.0 - 1.2 mg/dL 0.3     Alkaline Phos 38 - 126 U/L 51     AST 15 - 41 U/L 17     ALT 0 - 44 U/L 7         RADIOGRAPHIC STUDIES: I have personally reviewed the radiological images as  listed and agreed with the findings in the report. No results found.    No orders of the defined types were placed in this encounter.  All questions were answered. The patient knows to call the clinic with any problems, questions or concerns. No barriers to learning was detected. The total time spent in the appointment was 25 minutes, including review of chart and various tests results, discussions about plan of care and coordination of care plan     Onita Mattock, MD 12/03/2024     "

## 2024-12-03 NOTE — Patient Instructions (Signed)
 [facility] - A DEPT OF [facility]  Discharge Instructions: Thank you for choosing [facility] to provide your oncology and hematology care.   If you have a lab appointment with the Cancer Center, please go directly to the Cancer Center and check in at the registration area.   Wear comfortable clothing and clothing appropriate for easy access to any Portacath or PICC line.   We strive to give you quality time with your provider. You may need to reschedule your appointment if you arrive late (15 or more minutes).  Arriving late affects you and other patients whose appointments are after yours.  Also, if you miss three or more appointments without notifying the office, you may be dismissed from the clinic at the provider's discretion.      For prescription refill requests, have your pharmacy contact our office and allow 72 hours for refills to be completed.    Today you received the following chemotherapy and/or immunotherapy agents:  fluorouracil  (ADRUCIL )   To help prevent nausea and vomiting after your treatment, we encourage you to take your nausea medication as directed.  BELOW ARE SYMPTOMS THAT SHOULD BE REPORTED IMMEDIATELY: *FEVER GREATER THAN 100.4 F (38 C) OR HIGHER *CHILLS OR SWEATING *NAUSEA AND VOMITING THAT IS NOT CONTROLLED WITH YOUR NAUSEA MEDICATION *UNUSUAL SHORTNESS OF BREATH *UNUSUAL BRUISING OR BLEEDING *URINARY PROBLEMS (pain or burning when urinating, or frequent urination) *BOWEL PROBLEMS (unusual diarrhea, constipation, pain near the anus) TENDERNESS IN MOUTH AND THROAT WITH OR WITHOUT PRESENCE OF ULCERS (sore throat, sores in mouth, or a toothache) UNUSUAL RASH, SWELLING OR PAIN  UNUSUAL VAGINAL DISCHARGE OR ITCHING   Items with * indicate a potential emergency and should be followed up as soon as possible or go to the Emergency Department if any problems should occur.  Please show the CHEMOTHERAPY ALERT CARD or  IMMUNOTHERAPY ALERT CARD at check-in to the Emergency Department and triage nurse.  Should you have questions after your visit or need to cancel or reschedule your appointment, please contact [facility] - A DEPT OF [facility]  Dept: [phone]  and follow the prompts.  Office hours are 8:00 a.m. to 4:30 p.m. Monday - Friday. Please note that voicemails left after 4:00 p.m. may not be returned until the following business day.  We are closed weekends and major holidays. You have access to a nurse at all times for urgent questions. Please call the main number to the clinic Dept: [phone] and follow the prompts.   For any non-urgent questions, you may also contact your provider using MyChart. We now offer e-Visits for anyone 73 and older to request care online for non-urgent symptoms. For details visit [email or web address].   Also download the MyChart app! Go to the app store, search MyChart, open the app, select [facility], and log in with your MyChart username and password.

## 2024-12-03 NOTE — Assessment & Plan Note (Signed)
 [id number], stage IIIB, G2, MMR normal - Diagnosed on November 22, 2024.  Patient presented with severe rectal bleeding to the hospital, required multiple blood transfusion.  Endoscopy showed a mass in the anal canal and extending to the distal rectum, they confirmed adenocarcinoma, MMR normal. -Started concurrent chemoradiation with 5-FU continuous infusion in the hospital on 11/27/2024.

## 2024-12-03 NOTE — Progress Notes (Signed)
 XRT will be given on 12/22 - 12/24 this week.  5FU CIV will infuse at  225 mg/m2/day x 4 days (over 96 hours) this week.  Pump DC will be on 12/26 due to the holiday.  Bridgett Leach , [place], BCPS, BCOP 12/03/2024 1:45 PM

## 2024-12-04 ENCOUNTER — Other Ambulatory Visit: Payer: Self-pay

## 2024-12-04 ENCOUNTER — Ambulatory Visit
Admission: RE | Admit: 2024-12-04 | Discharge: 2024-12-04 | Disposition: A | Discharge status: Home / Self Care | Payer: Medicare (Managed Care) | Source: Ambulatory Visit | Attending: Radiation Oncology | Admitting: Radiation Oncology

## 2024-12-04 DIAGNOSIS — Z510 Encounter for antineoplastic radiation therapy: Secondary | ICD-10-CM | POA: Diagnosis not present

## 2024-12-04 LAB — RAD ONC ARIA SESSION SUMMARY
Course Elapsed Days: 7
Plan Fractions Treated to Date: 7
Plan Prescribed Dose Per Fraction: 1.8 Gy
Plan Total Fractions Prescribed: 25
Plan Total Prescribed Dose: 45 Gy
Reference Point Dosage Given to Date: 12.6 Gy
Reference Point Session Dosage Given: 1.8 Gy
Session Number: 7

## 2024-12-05 ENCOUNTER — Other Ambulatory Visit (HOSPITAL_COMMUNITY): Payer: Self-pay

## 2024-12-05 ENCOUNTER — Other Ambulatory Visit: Payer: Self-pay

## 2024-12-05 ENCOUNTER — Ambulatory Visit
Admission: RE | Admit: 2024-12-05 | Discharge: 2024-12-05 | Disposition: A | Payer: Medicare (Managed Care) | Source: Ambulatory Visit | Attending: Radiation Oncology

## 2024-12-05 ENCOUNTER — Encounter: Payer: Self-pay | Admitting: Licensed Clinical Social Worker

## 2024-12-05 DIAGNOSIS — Z510 Encounter for antineoplastic radiation therapy: Secondary | ICD-10-CM | POA: Diagnosis not present

## 2024-12-05 LAB — RAD ONC ARIA SESSION SUMMARY
Course Elapsed Days: 8
Plan Fractions Treated to Date: 8
Plan Prescribed Dose Per Fraction: 1.8 Gy
Plan Total Fractions Prescribed: 25
Plan Total Prescribed Dose: 45 Gy
Reference Point Dosage Given to Date: 14.4 Gy
Reference Point Session Dosage Given: 1.8 Gy
Session Number: 8

## 2024-12-07 ENCOUNTER — Inpatient Hospital Stay: Payer: Medicare (Managed Care)

## 2024-12-07 ENCOUNTER — Other Ambulatory Visit (HOSPITAL_COMMUNITY): Payer: Self-pay

## 2024-12-08 ENCOUNTER — Inpatient Hospital Stay: Payer: Medicare (Managed Care)

## 2024-12-09 NOTE — Progress Notes (Deleted)
 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  Patient presented with severe rectal bleeding to the hospital, required multiple blood transfusion.  Endoscopy showed a mass in the anal canal and extending to the distal rectum, they confirmed adenocarcinoma, MMR normal. -Started concurrent chemoradiation with 5-FU continuous infusion in the hospital on 11/27/2024.  - 12/10/2024 -today, she presents for cycle 3 concurrent chemoradiation with 5-FU infusion pump.    The patient understands the plans discussed today and is in agreement with them.  She knows to contact our office if she develops concerns prior to her next appointment.  I provided *** minutes of face-to-face time during this encounter and > 50% was spent counseling as documented under my assessment and plan.    Powell FORBES Lessen, NP  [facility] - A DEPT OF [facility] [address] [place] [address] Dept: [phone] Dept Fax: [phone]   No orders of the defined types were placed in this encounter.     CHIEF COMPLAINT:  CC: Rectal cancer  Current Treatment: Concurrent chemoradiation with 5-FU pump  INTERVAL HISTORY:  Scotty is here today for repeat clinical assessment.  She last saw Dr. Lanny on 12/03/2024.  Today, she presents for cycle 3 day 1 of chemoradiation with 5-FU infusion pump.  She denies fevers or chills. She denies pain. Her appetite is good. Her weight {Weight change:10426}.  I have reviewed the past medical history, past surgical history, social history and family history with the patient and they are unchanged from previous note.  ALLERGIES:   has no known allergies.  MEDICATIONS:  Current Outpatient Medications  Medication Sig Dispense Refill   acetaminophen  (TYLENOL ) 500 MG tablet Take 500-1,000 mg by mouth every 8 (eight) hours as needed (for pain).     amLODipine  (NORVASC ) 10 MG tablet Take 1 tablet (10 mg total) by mouth daily. 30 tablet 1   [Paused] aspirin  EC 81 MG tablet Take 1 tablet (81 mg total) by mouth daily. Swallow whole. 90 tablet 3   atorvastatin  (LIPITOR) 40 MG tablet Take 40 mg by mouth at bedtime.     [Paused] furosemide  (LASIX ) 40 MG tablet Take 1 tablet (40 mg total) by mouth daily. 30 tablet 1   gabapentin  (NEURONTIN ) 300 MG capsule Take 300 mg by mouth daily.     hydrALAZINE  (APRESOLINE ) 100 MG tablet Take 100 mg by mouth 3 (three) times daily.     LORazepam  (ATIVAN ) 1 MG tablet Take 1 tablet (1 mg total) by mouth every 4 (four) hours as needed for anxiety. 30 tablet 0   metoprolol  tartrate (LOPRESSOR ) 50 MG tablet Take 50 mg by mouth 2 (two) times daily.     ondansetron  (ZOFRAN -ODT) 4 MG disintegrating tablet Take 1 tablet (4 mg total) by mouth every 8 (eight) hours as needed for nausea or vomiting. 20 tablet 0   oxyCODONE  (OXY IR/ROXICODONE ) 5 MG immediate release tablet Take 1 tablet (5 mg total) by mouth every 6 (six) hours as needed for moderate pain (pain score 4-6). 30 tablet 0   polyethylene glycol powder (GLYCOLAX /MIRALAX ) 17 GM/SCOOP powder Take 17 g by mouth daily as needed for mild constipation.  Dissolve 1 capful (17g) in 4-8 ounces of liquid and take by mouth daily. 238 g 0   senna-docusate (SENOKOT-S) 8.6-50 MG tablet Take 1 tablet by mouth 2 (two) times daily. 60 tablet 0   No current facility-administered medications for this visit.    HISTORY OF PRESENT ILLNESS:   Oncology History  Rectal adenocarcinoma (HCC)  11/22/2024 Cancer Staging   Staging form: Colon and Rectum, AJCC 8th Edition - Clinical stage from 11/22/2024: Stage IIIB (cT3, cN1b, cM0) - Signed by Lanny Callander, MD on  12/02/2024 Stage prefix: Initial diagnosis Histologic grade (G): G2 Histologic grading system: 4 grade system   11/23/2024 Initial Diagnosis   Rectal adenocarcinoma (HCC)   11/27/2024 -  Chemotherapy   Patient is on Treatment Plan : RECTUM 5FU IVCI D1-5 (225) + XRT         REVIEW OF SYSTEMS:   Constitutional: Denies fevers, chills or abnormal weight loss Eyes: Denies blurriness of vision Ears, nose, mouth, throat, and face: Denies mucositis or sore throat Respiratory: Denies cough, dyspnea or wheezes Cardiovascular: Denies palpitation, chest discomfort or lower extremity swelling Gastrointestinal:  Denies nausea, heartburn or change in bowel habits Skin: Denies abnormal skin rashes Lymphatics: Denies new lymphadenopathy or easy bruising Neurological:Denies numbness, tingling or new weaknesses Behavioral/Psych: Mood is stable, no new changes  All other systems were reviewed with the patient and are negative.   VITALS:  There were no vitals taken for this visit.  Wt Readings from Last 3 Encounters:  12/03/24 133 lb 8 oz (60.6 kg)  11/21/24 134 lb 11.2 oz (61.1 kg)  05/31/24 135 lb (61.2 kg)    There is no height or weight on file to calculate BMI.  Performance status (ECOG): {CHL ONC [id number]  PHYSICAL EXAM:   GENERAL:alert, no distress and comfortable SKIN: skin color, texture, turgor are normal, no rashes or significant lesions EYES: normal, Conjunctiva are pink and non-injected, sclera clear OROPHARYNX:no exudate, no erythema and lips, buccal mucosa, and tongue normal  NECK: supple, thyroid  normal size, non-tender, without nodularity LYMPH:  no palpable lymphadenopathy in the cervical, axillary or inguinal LUNGS: clear to auscultation and percussion with normal breathing effort HEART: regular rate & rhythm and no murmurs and no lower extremity edema ABDOMEN:abdomen soft, non-tender and normal bowel sounds Musculoskeletal:no cyanosis of digits and no clubbing   NEURO: alert & oriented x 3 with fluent speech, no focal motor/sensory deficits  LABORATORY DATA:  I have reviewed the data as listed    Component Value Date/Time   NA 144 12/03/2024 1240   NA 143 05/10/2024 1524   K 4.0 12/03/2024 1240   CL 110 12/03/2024 1240   CO2 22 12/03/2024 1240   GLUCOSE 115 (H) 12/03/2024 1240   BUN 35 (H) 12/03/2024 1240   BUN 48 (H) 05/10/2024 1524   CREATININE 3.75 (H) 12/03/2024 1240   CREATININE 1.21 (H) 10/20/2016 1037   CALCIUM  8.4 (L) 12/03/2024 1240   PROT 6.1 (L) 12/03/2024 1240   PROT 7.6 01/27/2018 1518   ALBUMIN  4.1 12/03/2024 1240   ALBUMIN  4.4 01/27/2018 1518   AST 17 12/03/2024 1240   ALT 7 12/03/2024 1240   ALKPHOS 51 12/03/2024 1240   BILITOT 0.3 12/03/2024 1240   GFRNONAA 12 (L) 12/03/2024 1240   GFRNONAA 48 (L) 10/20/2016 1037   GFRAA 41 (L) 01/27/2018 1518   GFRAA 55 (L) 10/20/2016 1037    No results found for: SPEP, UPEP  Lab Results  Component Value Date   WBC 5.1 12/03/2024  NEUTROABS 4.1 12/03/2024   HGB 8.4 (L) 12/03/2024   HCT 25.7 (L) 12/03/2024   MCV 95.5 12/03/2024   PLT 213 12/03/2024      Chemistry      Component Value Date/Time   NA 144 12/03/2024 1240   NA 143 05/10/2024 1524   K 4.0 12/03/2024 1240   CL 110 12/03/2024 1240   CO2 22 12/03/2024 1240   BUN 35 (H) 12/03/2024 1240   BUN 48 (H) 05/10/2024 1524   CREATININE 3.75 (H) 12/03/2024 1240   CREATININE 1.21 (H) 10/20/2016 1037      Component Value Date/Time   CALCIUM  8.4 (L) 12/03/2024 1240   ALKPHOS 51 12/03/2024 1240   AST 17 12/03/2024 1240   ALT 7 12/03/2024 1240   BILITOT 0.3 12/03/2024 1240       RADIOGRAPHIC STUDIES: I have personally reviewed the radiological images as listed and agreed with the findings in the report. CT CHEST WO CONTRAST Result Date: 11/23/2024 EXAM: CT CHEST WITHOUT CONTRAST 11/23/2024 07:55:44 PM TECHNIQUE: CT of the chest was performed without the administration of intravenous contrast. Multiplanar  reformatted images are provided for review. Automated exposure control, iterative reconstruction, and/or weight based adjustment of the mA/kV was utilized to reduce the radiation dose to as low as reasonably achievable. COMPARISON: 04/15/2024 CLINICAL HISTORY: Rectal cancer, staging. 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 Cardiomegaly. Small pericardial effusion. Mild 3-vessel coronary atherosclerosis. Aorta: Suspected stable thoracic aortic dissection (images 23 and 61), poorly visualized on unenhanced CT. Associated stable thoracic aortic aneurysm at the level of the transverse aortic arch measuring 4.4 cm (image 19). Central airways: The central airways are clear. Vessels: Left PICC terminates in upper SVC. LYMPH NODES: No mediastinal, hilar or axillary lymphadenopathy. LUNGS AND PLEURA: Scattered calcified granulomata, benign. Two left upper lobe pulmonary nodules measuring up to 3 mm (images 50 to 51), unchanged, benign. Mild linear scarring/atelectasis in bilateral lower lobes. No pleural effusion or pneumothorax. SOFT TISSUES/BONES: Median sternotomy. No acute abnormality of the bones or soft tissues. UPPER ABDOMEN: Limited images of the upper abdomen demonstrate an atrophic left kidney. 2.1 cm right adrenal nodule compatible with benign adrenal adenoma. IMPRESSION: 1. No evidence of metastatic disease. Two stable 3 mm left upper lobe nodules, benign. 2. Status post aortic valve/root repair. Suspected stable thoracic aortic dissection with associated aneurysm, as above. 3. Cardiomegaly with a small pericardial effusion. Electronically signed by: Pinkie Pebbles MD 11/23/2024 09:44 PM EST RP Workstation: [id number]   US  EKG SITE RITE Result Date: 11/23/2024 If Site Rite image not attached, placement could not be confirmed due to current cardiac rhythm.  MR PELVIS WO CM RECTAL CA STAGING Result Date: 11/23/2024 CLINICAL DATA:  New diagnosis of anal  cancer. EXAM: MRI PELVIS WITHOUT CONTRAST TECHNIQUE: Multiplanar multisequence MR imaging of the pelvis was performed. No intravenous contrast was administered. Ultrasound gel was administered per rectum to optimize tumor evaluation. COMPARISON:  CTA of 11/21/2024 FINDINGS: TUMOR LOCATION Tumor distance from Anal Verge/Skin surface: Not applicable. Appears to protrude from the anus, including on image 32/5. Tumor distance to Internal Anal sphincter: Not applicable. TUMOR DESCRIPTION Circumferential extent: Circumferential, including on image 31/5. Tumor Size and volume: 5.3 by 2.7 x 2.8 cm on images 18/3 and 31/5. Volume 20.8 cc T - CATEGORY Based on lesion size and presumed anal primary, T3. Levator Ani Involvement: Present, especially eccentric right and posteriorly on image 31/5. N - CATEGORY Right inguinal adenopathy at 1.5 cm on image 15/5.  This node is hyperenhancing on image 185/9 of the prior CTA. A right external iliac 9 mm node on image 8/5 is also hyperenhancing on the prior CTA. N1b. Other: No significant free fluid. A left ovarian T2 hypointense 2.5 x 2.0 cm on image 13/5. No bowel obstruction. The patient halted the exam after the initial T2 weighted imaging was performed. IMPRESSION: Exam was halted at patient request after the initial T2 weighted images. Anal primary, staged as T3, N1b as detailed above. Involvement of the levator ani, as detailed above. 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 Consider nonemergent outpatient follow-up pre and postcontrast MRI at 3 months versus ultrasound surveillance. Electronically Signed   By: Rockey Kilts M.D.   On: 11/23/2024 13:57   CT ANGIO GI BLEED Result Date: 11/21/2024 CLINICAL DATA:  GI bleed. EXAM: CTA ABDOMEN AND PELVIS WITHOUT AND WITH CONTRAST TECHNIQUE: Multidetector CT imaging of the abdomen and pelvis was performed using the standard protocol during bolus administration of intravenous contrast. Multiplanar reconstructed  images and MIPs were obtained and reviewed to evaluate the vascular anatomy. RADIATION DOSE REDUCTION: This exam was performed according to the departmental dose-optimization program which includes automated exposure control, adjustment of the mA and/or kV according to patient size and/or use of iterative reconstruction technique. CONTRAST:  75mL OMNIPAQUE  IOHEXOL  350 MG/ML SOLN COMPARISON:  CT abdomen pelvis dated 12/02/2020. FINDINGS: VASCULAR Aorta: Old dissection flap in the abdominal aorta. There is mild atherosclerotic calcification of the aorta. The true and false lumens appear patent. Celiac: The celiac artery is patent. SMA: There is extension of the dissection flap into the SMA. The SMA is patent. Renals: The right renal arteries are patent. The left linear artery is narrowed and appears scarred with diminished flow. IMA: The IMA is patent. Inflow: Extension of the dissection flap into the left common iliac and external iliac arteries. The iliac arteries are patent bilaterally. Proximal Outflow: The visualized proximal outflow is patent. Veins: The IVC is unremarkable. The SMV, splenic vein, and main portal vein are patent. No portal venous gas. Review of the MIP images confirms the above findings. NON-VASCULAR Lower chest: Small bilateral pleural effusions. Partially visualized pericardial effusion measuring 7 mm in thickness. No intra-abdominal free air.  Small free fluid in the pelvis. Hepatobiliary: The liver is unremarkable. The gallbladder is contracted. Small gallstones suspected in the gallbladder. Pancreas: Unremarkable. No pancreatic ductal dilatation or surrounding inflammatory changes. Spleen: Normal in size without focal abnormality. Adrenals/Urinary Tract: Bilateral adrenal thickening/hyperplasia or adenoma similar to prior CT. Left renal parenchyma atrophy. Several left renal cysts. A 12 mm high attenuating lesion from the posterior lower pole of the left kidney as well as a 2 cm high  attenuating lesion from the upper pole of the left kidney are not characterized on this CT but were present on the prior CT, likely proteinaceous cysts. No hydronephrosis on either side. The urinary bladder is grossly unremarkable. Stomach/Bowel: Moderate stool throughout the colon. No bowel obstruction or active inflammation. No evidence of active GI bleed. The appendix is normal. Lymphatic: No adenopathy. Reproductive: Hysterectomy. Other: None Musculoskeletal: No acute or significant osseous findings. IMPRESSION: 1. No evidence of active GI bleed. 2. Old dissection flap in the abdominal aorta with extension of the dissection flap into the SMA and left common iliac and external iliac arteries. The aorta and mesenteric vessels are patent. 3. Small bilateral pleural effusions. 4. Partially visualized pericardial effusion. 5. No bowel obstruction. Normal appendix. 6.  Aortic Atherosclerosis (ICD10-I70.0). Electronically  Signed   By: Arash  Radparvar M.D.   On: 11/21/2024 19:03   "

## 2024-12-09 NOTE — Assessment & Plan Note (Deleted)
 [id number], stage IIIB, G2, MMR normal - Diagnosed on November 22, 2024.  Patient presented with severe rectal bleeding to the hospital, required multiple blood transfusion.  Endoscopy showed a mass in the anal canal and extending to the distal rectum, they confirmed adenocarcinoma, MMR normal. -Started concurrent chemoradiation with 5-FU continuous infusion in the hospital on 11/27/2024.  - 12/10/2024 -today, she presents for cycle 3 concurrent chemoradiation with 5-FU infusion pump.

## 2024-12-10 ENCOUNTER — Encounter (HOSPITAL_COMMUNITY): Payer: Self-pay | Admitting: Internal Medicine

## 2024-12-10 ENCOUNTER — Ambulatory Visit: Payer: Medicare (Managed Care) | Admitting: Gastroenterology

## 2024-12-10 ENCOUNTER — Encounter: Payer: Self-pay | Admitting: Hematology

## 2024-12-10 ENCOUNTER — Other Ambulatory Visit: Payer: Self-pay

## 2024-12-10 ENCOUNTER — Ambulatory Visit
Admission: RE | Admit: 2024-12-10 | Discharge: 2024-12-10 | Disposition: A | Discharge status: Home / Self Care | Payer: Medicare (Managed Care) | Source: Ambulatory Visit | Attending: Radiation Oncology | Admitting: Radiation Oncology

## 2024-12-10 ENCOUNTER — Inpatient Hospital Stay: Payer: Medicare (Managed Care)

## 2024-12-10 ENCOUNTER — Inpatient Hospital Stay: Payer: Medicare (Managed Care) | Admitting: Nurse Practitioner

## 2024-12-10 ENCOUNTER — Telehealth (HOSPITAL_COMMUNITY): Payer: Self-pay | Admitting: Pharmacy Technician

## 2024-12-10 DIAGNOSIS — Z510 Encounter for antineoplastic radiation therapy: Secondary | ICD-10-CM | POA: Diagnosis not present

## 2024-12-10 DIAGNOSIS — C20 Malignant neoplasm of rectum: Secondary | ICD-10-CM

## 2024-12-10 LAB — RAD ONC ARIA SESSION SUMMARY
Course Elapsed Days: 13
Plan Fractions Treated to Date: 9
Plan Prescribed Dose Per Fraction: 1.8 Gy
Plan Total Fractions Prescribed: 25
Plan Total Prescribed Dose: 45 Gy
Reference Point Dosage Given to Date: 16.2 Gy
Reference Point Session Dosage Given: 1.8 Gy
Session Number: 9

## 2024-12-10 NOTE — Progress Notes (Signed)
 The proposed treatment discussed in conference is for discussion purpose only and is not a binding recommendation.  The patients have not been physically examined, or presented with their treatment options.  Therefore, final treatment plans cannot be decided.

## 2024-12-10 NOTE — Telephone Encounter (Signed)
 Auth Submission: NO AUTH NEEDED Site of care: [facility] Payer: [employer] Medication & CPT/J Code(s) submitted: Venofer  (Iron  Sucrose) J1756 Diagnosis Code:  Route of submission (phone, fax, portal): phone Phone # [phone] Fax # Auth type: Buy/Bill HB Units/visits requested: 300mg  x 2 doses Reference number: [id number] Approval from: 12/10/2024 to 03/10/25   Please call [facility] to schedule appts. [phone]   Dagoberto Armour, CPhT [facility] Phone: [phone] 12/10/2024

## 2024-12-11 ENCOUNTER — Ambulatory Visit
Admission: RE | Admit: 2024-12-11 | Discharge: 2024-12-11 | Disposition: A | Discharge status: Home / Self Care | Payer: Medicare (Managed Care) | Source: Ambulatory Visit | Attending: Radiation Oncology | Admitting: Radiation Oncology

## 2024-12-11 ENCOUNTER — Other Ambulatory Visit: Payer: Self-pay

## 2024-12-11 ENCOUNTER — Ambulatory Visit: Payer: Medicare (Managed Care)

## 2024-12-11 DIAGNOSIS — Z510 Encounter for antineoplastic radiation therapy: Secondary | ICD-10-CM | POA: Diagnosis not present

## 2024-12-11 LAB — RAD ONC ARIA SESSION SUMMARY
Course Elapsed Days: 14
Plan Fractions Treated to Date: 10
Plan Prescribed Dose Per Fraction: 1.8 Gy
Plan Total Fractions Prescribed: 25
Plan Total Prescribed Dose: 45 Gy
Reference Point Dosage Given to Date: 18 Gy
Reference Point Session Dosage Given: 1.8 Gy
Session Number: 10

## 2024-12-12 ENCOUNTER — Ambulatory Visit
Admission: RE | Admit: 2024-12-12 | Discharge: 2024-12-12 | Disposition: A | Discharge status: Home / Self Care | Payer: Medicare (Managed Care) | Source: Ambulatory Visit | Attending: Radiation Oncology | Admitting: Radiation Oncology

## 2024-12-12 ENCOUNTER — Ambulatory Visit: Admission: RE | Admit: 2024-12-12 | Payer: Medicare (Managed Care) | Source: Ambulatory Visit

## 2024-12-12 ENCOUNTER — Ambulatory Visit: Payer: Medicare (Managed Care)

## 2024-12-12 ENCOUNTER — Other Ambulatory Visit: Payer: Self-pay

## 2024-12-12 DIAGNOSIS — Z510 Encounter for antineoplastic radiation therapy: Secondary | ICD-10-CM | POA: Diagnosis not present

## 2024-12-12 LAB — RAD ONC ARIA SESSION SUMMARY
Course Elapsed Days: 15
Plan Fractions Treated to Date: 11
Plan Prescribed Dose Per Fraction: 1.8 Gy
Plan Total Fractions Prescribed: 25
Plan Total Prescribed Dose: 45 Gy
Reference Point Dosage Given to Date: 19.8 Gy
Reference Point Session Dosage Given: 1.8 Gy
Session Number: 11

## 2024-12-12 MED ORDER — FLUCONAZOLE 150 MG PO TABS: 150.0000 mg | ORAL_TABLET | Freq: Once | ORAL | 0 refills | Status: AC

## 2024-12-14 ENCOUNTER — Ambulatory Visit
Admission: RE | Admit: 2024-12-14 | Discharge: 2024-12-14 | Disposition: A | Discharge status: Home / Self Care | Payer: Medicare (Managed Care) | Source: Ambulatory Visit | Attending: Radiation Oncology | Admitting: Radiation Oncology

## 2024-12-14 ENCOUNTER — Other Ambulatory Visit: Payer: Self-pay

## 2024-12-14 ENCOUNTER — Ambulatory Visit: Payer: Medicare (Managed Care)

## 2024-12-14 ENCOUNTER — Encounter (HOSPITAL_COMMUNITY): Payer: Self-pay | Admitting: Internal Medicine

## 2024-12-14 ENCOUNTER — Encounter: Payer: Self-pay | Admitting: Hematology

## 2024-12-14 ENCOUNTER — Inpatient Hospital Stay: Payer: Medicare (Managed Care)

## 2024-12-14 ENCOUNTER — Other Ambulatory Visit: Payer: Self-pay | Admitting: Radiation Oncology

## 2024-12-14 DIAGNOSIS — Z510 Encounter for antineoplastic radiation therapy: Secondary | ICD-10-CM | POA: Insufficient documentation

## 2024-12-14 DIAGNOSIS — C20 Malignant neoplasm of rectum: Secondary | ICD-10-CM | POA: Insufficient documentation

## 2024-12-14 LAB — RAD ONC ARIA SESSION SUMMARY
Course Elapsed Days: 17
Plan Fractions Treated to Date: 12
Plan Prescribed Dose Per Fraction: 1.8 Gy
Plan Total Fractions Prescribed: 25
Plan Total Prescribed Dose: 45 Gy
Reference Point Dosage Given to Date: 21.6 Gy
Reference Point Session Dosage Given: 1.8 Gy
Session Number: 12

## 2024-12-14 MED ORDER — TRIAMCINOLONE ACETONIDE 0.1 % EX CREA: 1.0000 | TOPICAL_CREAM | Freq: Two times a day (BID) | CUTANEOUS | 0 refills | Status: AC

## 2024-12-15 ENCOUNTER — Inpatient Hospital Stay: Payer: Medicare (Managed Care)

## 2024-12-17 ENCOUNTER — Other Ambulatory Visit: Payer: Self-pay

## 2024-12-17 ENCOUNTER — Ambulatory Visit
Admission: RE | Admit: 2024-12-17 | Discharge: 2024-12-17 | Disposition: A | Discharge status: Home / Self Care | Payer: Medicare (Managed Care) | Source: Ambulatory Visit | Attending: Radiation Oncology | Admitting: Radiation Oncology

## 2024-12-17 ENCOUNTER — Inpatient Hospital Stay: Payer: Medicare (Managed Care)

## 2024-12-17 ENCOUNTER — Inpatient Hospital Stay: Payer: Medicare (Managed Care) | Attending: Hematology

## 2024-12-17 ENCOUNTER — Other Ambulatory Visit: Payer: Self-pay | Admitting: Radiation Oncology

## 2024-12-17 ENCOUNTER — Ambulatory Visit: Payer: Medicare (Managed Care)

## 2024-12-17 VITALS — BP 152/98 | HR 68 | Temp 98.2°F | Resp 18

## 2024-12-17 DIAGNOSIS — Z79899 Other long term (current) drug therapy: Secondary | ICD-10-CM | POA: Insufficient documentation

## 2024-12-17 DIAGNOSIS — R197 Diarrhea, unspecified: Secondary | ICD-10-CM | POA: Insufficient documentation

## 2024-12-17 DIAGNOSIS — C20 Malignant neoplasm of rectum: Secondary | ICD-10-CM

## 2024-12-17 DIAGNOSIS — D6959 Other secondary thrombocytopenia: Secondary | ICD-10-CM | POA: Insufficient documentation

## 2024-12-17 DIAGNOSIS — T451X5A Adverse effect of antineoplastic and immunosuppressive drugs, initial encounter: Secondary | ICD-10-CM | POA: Insufficient documentation

## 2024-12-17 DIAGNOSIS — Z5111 Encounter for antineoplastic chemotherapy: Secondary | ICD-10-CM | POA: Insufficient documentation

## 2024-12-17 DIAGNOSIS — Z923 Personal history of irradiation: Secondary | ICD-10-CM | POA: Insufficient documentation

## 2024-12-17 DIAGNOSIS — D72819 Decreased white blood cell count, unspecified: Secondary | ICD-10-CM | POA: Insufficient documentation

## 2024-12-17 DIAGNOSIS — Z7982 Long term (current) use of aspirin: Secondary | ICD-10-CM | POA: Insufficient documentation

## 2024-12-17 LAB — CBC WITH DIFFERENTIAL (CANCER CENTER ONLY)
Abs Immature Granulocytes: 0.01 K/uL (ref 0.00–0.07)
Basophils Absolute: 0 K/uL (ref 0.0–0.1)
Basophils Relative: 0 %
Eosinophils Absolute: 0.3 K/uL (ref 0.0–0.5)
Eosinophils Relative: 9 %
HCT: 28.1 % — ABNORMAL LOW (ref 36.0–46.0)
Hemoglobin: 9.2 g/dL — ABNORMAL LOW (ref 12.0–15.0)
Immature Granulocytes: 0 %
Lymphocytes Relative: 11 %
Lymphs Abs: 0.4 K/uL — ABNORMAL LOW (ref 0.7–4.0)
MCH: 31.7 pg (ref 26.0–34.0)
MCHC: 32.7 g/dL (ref 30.0–36.0)
MCV: 96.9 fL (ref 80.0–100.0)
Monocytes Absolute: 0.5 K/uL (ref 0.1–1.0)
Monocytes Relative: 15 %
Neutro Abs: 2.2 K/uL (ref 1.7–7.7)
Neutrophils Relative %: 65 %
Platelet Count: 139 K/uL — ABNORMAL LOW (ref 150–400)
RBC: 2.9 MIL/uL — ABNORMAL LOW (ref 3.87–5.11)
RDW: 18.4 % — ABNORMAL HIGH (ref 11.5–15.5)
WBC Count: 3.4 K/uL — ABNORMAL LOW (ref 4.0–10.5)
nRBC: 0 % (ref 0.0–0.2)

## 2024-12-17 LAB — RAD ONC ARIA SESSION SUMMARY
Course Elapsed Days: 20
Plan Fractions Treated to Date: 13
Plan Prescribed Dose Per Fraction: 1.8 Gy
Plan Total Fractions Prescribed: 25
Plan Total Prescribed Dose: 45 Gy
Reference Point Dosage Given to Date: 23.4 Gy
Reference Point Session Dosage Given: 1.8 Gy
Session Number: 13

## 2024-12-17 LAB — CMP (CANCER CENTER ONLY)
ALT: 7 U/L (ref 0–44)
AST: 18 U/L (ref 15–41)
Albumin: 4.4 g/dL (ref 3.5–5.0)
Alkaline Phosphatase: 55 U/L (ref 38–126)
Anion gap: 13 (ref 5–15)
BUN: 19 mg/dL (ref 8–23)
CO2: 23 mmol/L (ref 22–32)
Calcium: 9.2 mg/dL (ref 8.9–10.3)
Chloride: 110 mmol/L (ref 98–111)
Creatinine: 3.29 mg/dL — ABNORMAL HIGH (ref 0.44–1.00)
GFR, Estimated: 14 mL/min — ABNORMAL LOW
Glucose, Bld: 94 mg/dL (ref 70–99)
Potassium: 3.9 mmol/L (ref 3.5–5.1)
Sodium: 145 mmol/L (ref 135–145)
Total Bilirubin: 0.3 mg/dL (ref 0.0–1.2)
Total Protein: 7 g/dL (ref 6.5–8.1)

## 2024-12-17 MED ORDER — SODIUM CHLORIDE 0.9 % IV SOLN
225.0000 mg/m2/d | INTRAVENOUS | Status: DC
  Administered 2024-12-17: 1500 mg via INTRAVENOUS
  Filled 2024-12-17: qty 30

## 2024-12-17 MED ORDER — SODIUM CHLORIDE 0.9% FLUSH: 10.0000 mL | INTRAVENOUS | Status: DC | PRN

## 2024-12-17 NOTE — Progress Notes (Signed)
 I called and left a voicemail for the patient to clarify the nature of a pelvic floor rehab consultation with physical therapy and they would review vaginal dilator use which will be provided to her on Friday during her under treatment visit.

## 2024-12-17 NOTE — Patient Instructions (Signed)
 [facility] - A DEPT OF [facility]  Discharge Instructions: Thank you for choosing [facility] to provide your oncology and hematology care.   If you have a lab appointment with the Cancer Center, please go directly to the Cancer Center and check in at the registration area.   Wear comfortable clothing and clothing appropriate for easy access to any Portacath or PICC line.   We strive to give you quality time with your provider. You may need to reschedule your appointment if you arrive late (15 or more minutes).  Arriving late affects you and other patients whose appointments are after yours.  Also, if you miss three or more appointments without notifying the office, you may be dismissed from the clinic at the providers discretion.      For prescription refill requests, have your pharmacy contact our office and allow 72 hours for refills to be completed.    Today you received the following chemotherapy and/or immunotherapy agents adrucil       To help prevent nausea and vomiting after your treatment, we encourage you to take your nausea medication as directed.  BELOW ARE SYMPTOMS THAT SHOULD BE REPORTED IMMEDIATELY: *FEVER GREATER THAN 100.4 F (38 C) OR HIGHER *CHILLS OR SWEATING *NAUSEA AND VOMITING THAT IS NOT CONTROLLED WITH YOUR NAUSEA MEDICATION *UNUSUAL SHORTNESS OF BREATH *UNUSUAL BRUISING OR BLEEDING *URINARY PROBLEMS (pain or burning when urinating, or frequent urination) *BOWEL PROBLEMS (unusual diarrhea, constipation, pain near the anus) TENDERNESS IN MOUTH AND THROAT WITH OR WITHOUT PRESENCE OF ULCERS (sore throat, sores in mouth, or a toothache) UNUSUAL RASH, SWELLING OR PAIN  UNUSUAL VAGINAL DISCHARGE OR ITCHING   Items with * indicate a potential emergency and should be followed up as soon as possible or go to the Emergency Department if any problems should occur.  Please show the CHEMOTHERAPY ALERT CARD or IMMUNOTHERAPY  ALERT CARD at check-in to the Emergency Department and triage nurse.  Should you have questions after your visit or need to cancel or reschedule your appointment, please contact [facility]  Dept: [phone]  and follow the prompts.  Office hours are 8:00 a.m. to 4:30 p.m. Monday - Friday. Please note that voicemails left after 4:00 p.m. may not be returned until the following business day.  We are closed weekends and major holidays. You have access to a nurse at all times for urgent questions. Please call the main number to the clinic Dept: [phone] and follow the prompts.   For any non-urgent questions, you may also contact your provider using MyChart. We now offer e-Visits for anyone 36 and older to request care online for non-urgent symptoms. For details visit [email or web address].   Also download the MyChart app! Go to the app store, search MyChart, open the app, select [place], and log in with your MyChart username and password.

## 2024-12-17 NOTE — Progress Notes (Signed)
 PATIENT NAVIGATOR PROGRESS NOTE  Name: Mary Stephens Date: 12/17/2024 MRN: [id number]  DOB: [birth year]   Reason for visit:  Follow-up with patient  Comments:    Patient's case was discussed during the 12/24 GI Multidisciplinary Conference. Recommendations during conference were to refer patient to colorectal surgeon for evaluation and also to cardiology for cardiac clearance.  Attempted to contact patient on 12/30 but patient's phone number was disconnected.  Called and spoke to patient's daughter Mary Stephens to see if patient had another phone number, but daughter stated patient's phone was currently disconnected.  Met with patient on 1/5 in infusion room to let her know the recommendations that were discussed during GI MDC and to expect phone calls from the surgeon's office and also cardiology.  Patient requested her daughter be contacted to schedule appointments.   Time spent counseling/coordinating care: 30-45 minutes

## 2024-12-17 NOTE — Progress Notes (Signed)
 Per Powell NOVAK, NP, ok to shorten her 5-FU infusion to 4 days a week with pump d/c on Friday, as pt has received in the past.  Clois Montavon, PharmD, MBA

## 2024-12-18 ENCOUNTER — Telehealth: Payer: Self-pay | Admitting: Nurse Practitioner

## 2024-12-18 ENCOUNTER — Other Ambulatory Visit: Payer: Self-pay

## 2024-12-18 ENCOUNTER — Ambulatory Visit: Payer: Medicare (Managed Care)

## 2024-12-18 ENCOUNTER — Ambulatory Visit
Admission: RE | Admit: 2024-12-18 | Discharge: 2024-12-18 | Disposition: A | Payer: Medicare (Managed Care) | Source: Ambulatory Visit | Attending: Radiation Oncology

## 2024-12-18 LAB — RAD ONC ARIA SESSION SUMMARY
Course Elapsed Days: 21
Plan Fractions Treated to Date: 14
Plan Prescribed Dose Per Fraction: 1.8 Gy
Plan Total Fractions Prescribed: 25
Plan Total Prescribed Dose: 45 Gy
Reference Point Dosage Given to Date: 25.2 Gy
Reference Point Session Dosage Given: 1.8 Gy
Session Number: 14

## 2024-12-18 NOTE — Telephone Encounter (Signed)
 12/17/2024 - I spoke with patient prior to start of 5-FU pump. She had many questions about changes that were made to her medications and symptoms she has been experiencing since she was hospitalized and diagnosed with rectal cancer.  -she did not understand why aspirin  was decreased from 325 mg to 81 mg. I explained that she experienced significant rectal bleeding which is one reason she was hospitalized. The reduction in aspirin  was to reduce the risk of further bleeding but would keep the cardiac and circulatory protection.  -oral iron  and calcium  were both discontinued. We discussed that one possible side effect of these medications was constipation and that constipation needs to be prevented due to the diagnosis of rectal cancer.  -prior to her hospitalization, she had been experiencing weakness and shortness of breath. She is still experiencing these symptoms, however, they are gradually improving. She was significantly anemic due to the blood loss she was having. That could cause moderate shortness of breath. She has also been referred to cardiology to assess heart function.  -she is experiencing severe diarrhea since starting treatment of radiation and 5-FU pump. She states that diarrhea is so severe that it keeps her awake at night. She has not tried any over the counter remedies to help relieve this symptoms. We discussed trial of OTC imodium. This is usually very effective. She needs to use it sparingly as constipation should be avoided. I recommended aggressive oral hydration.  -she is having severe fatigue. She is normally a very busy and active person and not used to feeling like this. Explained that treatment with concurrent chemoRT can cause considerable fatigue. This is expected side effect. She should rest when she feels tired and not push herself too hard. Again, encouraged aggressive oral hydration.  All suggestions were written out for her so she can share them with her daughter. She was  grateful for the time spent. She voiced understanding of all advice.  -she is scheduled to see Dr. Lanny on 12/24/2024.  Karyle Lessen, NP

## 2024-12-19 ENCOUNTER — Other Ambulatory Visit: Payer: Self-pay

## 2024-12-19 ENCOUNTER — Ambulatory Visit: Payer: Medicare (Managed Care)

## 2024-12-19 ENCOUNTER — Ambulatory Visit
Admission: RE | Admit: 2024-12-19 | Discharge: 2024-12-19 | Disposition: A | Discharge status: Home / Self Care | Payer: Medicare (Managed Care) | Source: Ambulatory Visit | Attending: Radiation Oncology | Admitting: Radiation Oncology

## 2024-12-19 LAB — RAD ONC ARIA SESSION SUMMARY
Course Elapsed Days: 22
Plan Fractions Treated to Date: 15
Plan Prescribed Dose Per Fraction: 1.8 Gy
Plan Total Fractions Prescribed: 25
Plan Total Prescribed Dose: 45 Gy
Reference Point Dosage Given to Date: 27 Gy
Reference Point Session Dosage Given: 1.8 Gy
Session Number: 15

## 2024-12-20 ENCOUNTER — Ambulatory Visit
Admission: RE | Admit: 2024-12-20 | Discharge: 2024-12-20 | Disposition: A | Discharge status: Home / Self Care | Payer: Medicare (Managed Care) | Source: Ambulatory Visit | Attending: Radiation Oncology | Admitting: Radiation Oncology

## 2024-12-20 ENCOUNTER — Ambulatory Visit: Payer: Medicare (Managed Care)

## 2024-12-20 ENCOUNTER — Other Ambulatory Visit: Payer: Self-pay

## 2024-12-20 LAB — RAD ONC ARIA SESSION SUMMARY
Course Elapsed Days: 23
Plan Fractions Treated to Date: 16
Plan Prescribed Dose Per Fraction: 1.8 Gy
Plan Total Fractions Prescribed: 25
Plan Total Prescribed Dose: 45 Gy
Reference Point Dosage Given to Date: 28.8 Gy
Reference Point Session Dosage Given: 1.8 Gy
Session Number: 16

## 2024-12-21 ENCOUNTER — Ambulatory Visit
Admission: RE | Admit: 2024-12-21 | Discharge: 2024-12-21 | Disposition: A | Payer: Medicare (Managed Care) | Source: Ambulatory Visit | Attending: Radiation Oncology

## 2024-12-21 ENCOUNTER — Other Ambulatory Visit: Payer: Self-pay

## 2024-12-21 ENCOUNTER — Inpatient Hospital Stay: Payer: Medicare (Managed Care)

## 2024-12-21 ENCOUNTER — Ambulatory Visit: Payer: Medicare (Managed Care)

## 2024-12-21 LAB — RAD ONC ARIA SESSION SUMMARY
Course Elapsed Days: 24
Plan Fractions Treated to Date: 17
Plan Prescribed Dose Per Fraction: 1.8 Gy
Plan Total Fractions Prescribed: 25
Plan Total Prescribed Dose: 45 Gy
Reference Point Dosage Given to Date: 30.6 Gy
Reference Point Session Dosage Given: 1.8 Gy
Session Number: 17

## 2024-12-21 NOTE — Progress Notes (Signed)
 Patient was given dilators today during her weekly under treatment visit.   She was informed she does not need to use the dilators until after her radiation treatments are complete.  She was informed she would receive a call from the physical therapy team to schedule an appointment with them and they will discuss the use and timing of the dilators.  She verbalized understanding.  Sharene EDISON RN, BSN

## 2024-12-22 ENCOUNTER — Inpatient Hospital Stay: Payer: Medicare (Managed Care)

## 2024-12-23 NOTE — Assessment & Plan Note (Signed)
 [id number], stage IIIB, G2, MMR normal - Diagnosed on November 22, 2024.  Patient presented with severe rectal bleeding to the hospital, required multiple blood transfusion.  Endoscopy showed a mass in the anal canal and extending to the distal rectum, they confirmed adenocarcinoma, MMR normal. -Started concurrent chemoradiation with 5-FU continuous infusion in the hospital on 11/27/2024.

## 2024-12-24 ENCOUNTER — Inpatient Hospital Stay: Payer: Medicare (Managed Care)

## 2024-12-24 ENCOUNTER — Ambulatory Visit: Payer: Medicare (Managed Care)

## 2024-12-24 ENCOUNTER — Ambulatory Visit
Admission: RE | Admit: 2024-12-24 | Discharge: 2024-12-24 | Disposition: A | Payer: Medicare (Managed Care) | Source: Ambulatory Visit | Attending: Radiation Oncology

## 2024-12-24 ENCOUNTER — Inpatient Hospital Stay: Payer: Medicare (Managed Care) | Admitting: Hematology

## 2024-12-24 ENCOUNTER — Telehealth: Payer: Self-pay

## 2024-12-24 ENCOUNTER — Other Ambulatory Visit: Payer: Self-pay

## 2024-12-24 DIAGNOSIS — C20 Malignant neoplasm of rectum: Secondary | ICD-10-CM

## 2024-12-24 LAB — RAD ONC ARIA SESSION SUMMARY
Course Elapsed Days: 27
Plan Fractions Treated to Date: 18
Plan Prescribed Dose Per Fraction: 1.8 Gy
Plan Total Fractions Prescribed: 25
Plan Total Prescribed Dose: 45 Gy
Reference Point Dosage Given to Date: 32.4 Gy
Reference Point Session Dosage Given: 1.8 Gy
Session Number: 18

## 2024-12-24 NOTE — Telephone Encounter (Signed)
 Called pt x2  due to missed appts today, pt did not answer either call. Left a VM to call back and reschedule .

## 2024-12-25 ENCOUNTER — Other Ambulatory Visit: Payer: Self-pay

## 2024-12-25 ENCOUNTER — Ambulatory Visit
Admission: RE | Admit: 2024-12-25 | Discharge: 2024-12-25 | Disposition: A | Payer: Medicare (Managed Care) | Source: Ambulatory Visit | Attending: Radiation Oncology

## 2024-12-25 ENCOUNTER — Encounter: Payer: Self-pay | Admitting: Licensed Clinical Social Worker

## 2024-12-25 ENCOUNTER — Ambulatory Visit: Payer: Medicare (Managed Care)

## 2024-12-25 ENCOUNTER — Inpatient Hospital Stay: Payer: Medicare (Managed Care) | Admitting: Hematology

## 2024-12-25 LAB — RAD ONC ARIA SESSION SUMMARY
Course Elapsed Days: 28
Plan Fractions Treated to Date: 19
Plan Prescribed Dose Per Fraction: 1.8 Gy
Plan Total Fractions Prescribed: 25
Plan Total Prescribed Dose: 45 Gy
Reference Point Dosage Given to Date: 34.2 Gy
Reference Point Session Dosage Given: 1.8 Gy
Session Number: 19

## 2024-12-25 NOTE — Progress Notes (Signed)
 Per Dr. Lanny, 5-FU pump for Cycle 4 to be given over 3 days: start on 1/14 & pump d/c on 1/17. 5-FU order has ben adjusted accordingly. Plan to start Cycle 5 on Monday 1/19 and infuse 5-FU pump over 5 days.  Harlene Nasuti, PharmD Oncology Infusion Pharmacist 12/25/2024 4:49 PM

## 2024-12-26 ENCOUNTER — Ambulatory Visit
Admission: RE | Admit: 2024-12-26 | Discharge: 2024-12-26 | Disposition: A | Discharge status: Home / Self Care | Payer: Medicare (Managed Care) | Source: Ambulatory Visit | Attending: Radiation Oncology | Admitting: Radiation Oncology

## 2024-12-26 ENCOUNTER — Inpatient Hospital Stay: Payer: Medicare (Managed Care)

## 2024-12-26 ENCOUNTER — Ambulatory Visit: Payer: Medicare (Managed Care)

## 2024-12-26 ENCOUNTER — Other Ambulatory Visit: Payer: Self-pay

## 2024-12-26 ENCOUNTER — Inpatient Hospital Stay: Payer: Medicare (Managed Care) | Admitting: Hematology

## 2024-12-26 VITALS — BP 115/77 | HR 71 | Temp 98.2°F | Resp 16 | Wt 127.5 lb

## 2024-12-26 DIAGNOSIS — C20 Malignant neoplasm of rectum: Secondary | ICD-10-CM

## 2024-12-26 LAB — RAD ONC ARIA SESSION SUMMARY
Course Elapsed Days: 29
Plan Fractions Treated to Date: 20
Plan Prescribed Dose Per Fraction: 1.8 Gy
Plan Total Fractions Prescribed: 25
Plan Total Prescribed Dose: 45 Gy
Reference Point Dosage Given to Date: 36 Gy
Reference Point Session Dosage Given: 1.8 Gy
Session Number: 20

## 2024-12-26 LAB — CBC WITH DIFFERENTIAL (CANCER CENTER ONLY)
Abs Immature Granulocytes: 0.02 K/uL (ref 0.00–0.07)
Basophils Absolute: 0 K/uL (ref 0.0–0.1)
Basophils Relative: 0 %
Eosinophils Absolute: 0.3 K/uL (ref 0.0–0.5)
Eosinophils Relative: 10 %
HCT: 27.8 % — ABNORMAL LOW (ref 36.0–46.0)
Hemoglobin: 9.2 g/dL — ABNORMAL LOW (ref 12.0–15.0)
Immature Granulocytes: 1 %
Lymphocytes Relative: 6 %
Lymphs Abs: 0.2 K/uL — ABNORMAL LOW (ref 0.7–4.0)
MCH: 31.5 pg (ref 26.0–34.0)
MCHC: 33.1 g/dL (ref 30.0–36.0)
MCV: 95.2 fL (ref 80.0–100.0)
Monocytes Absolute: 0.5 K/uL (ref 0.1–1.0)
Monocytes Relative: 18 %
Neutro Abs: 1.9 K/uL (ref 1.7–7.7)
Neutrophils Relative %: 65 %
Platelet Count: 143 K/uL — ABNORMAL LOW (ref 150–400)
RBC: 2.92 MIL/uL — ABNORMAL LOW (ref 3.87–5.11)
RDW: 17.6 % — ABNORMAL HIGH (ref 11.5–15.5)
WBC Count: 2.9 K/uL — ABNORMAL LOW (ref 4.0–10.5)
nRBC: 0 % (ref 0.0–0.2)

## 2024-12-26 LAB — CMP (CANCER CENTER ONLY)
ALT: 8 U/L (ref 0–44)
AST: 15 U/L (ref 15–41)
Albumin: 4 g/dL (ref 3.5–5.0)
Alkaline Phosphatase: 50 U/L (ref 38–126)
Anion gap: 15 (ref 5–15)
BUN: 30 mg/dL — ABNORMAL HIGH (ref 8–23)
CO2: 20 mmol/L — ABNORMAL LOW (ref 22–32)
Calcium: 8.7 mg/dL — ABNORMAL LOW (ref 8.9–10.3)
Chloride: 109 mmol/L (ref 98–111)
Creatinine: 4 mg/dL — ABNORMAL HIGH (ref 0.44–1.00)
GFR, Estimated: 11 mL/min — ABNORMAL LOW
Glucose, Bld: 118 mg/dL — ABNORMAL HIGH (ref 70–99)
Potassium: 3.4 mmol/L — ABNORMAL LOW (ref 3.5–5.1)
Sodium: 143 mmol/L (ref 135–145)
Total Bilirubin: <0.2 mg/dL (ref 0.0–1.2)
Total Protein: 6.4 g/dL — ABNORMAL LOW (ref 6.5–8.1)

## 2024-12-26 MED ORDER — SODIUM CHLORIDE 0.9 % IV SOLN: INTRAVENOUS | Status: DC

## 2024-12-26 MED ORDER — SODIUM CHLORIDE 0.9 % IV SOLN
225.0000 mg/m2/d | INTRAVENOUS | Status: DC
  Administered 2024-12-26: 1150 mg via INTRAVENOUS
  Filled 2024-12-26: qty 23

## 2024-12-26 NOTE — Assessment & Plan Note (Signed)
 [id number], stage IIIB, G2, MMR normal - Diagnosed on November 22, 2024.  Patient presented with severe rectal bleeding to the hospital, required multiple blood transfusion.  Endoscopy showed a mass in the anal canal and extending to the distal rectum, they confirmed adenocarcinoma, MMR normal. -Started concurrent chemoradiation with 5-FU continuous infusion in the hospital on 11/27/2024.

## 2024-12-26 NOTE — Progress Notes (Signed)
 " [facility]   [phone] Fax:([phone]   Clinic Follow up Note   Patient Care Team: Cloria Annabella CROME, DO as PCP - General (Geriatric Medicine) Lavona Agent, MD as PCP - Cardiology (Cardiology)  Date of Service:  12/26/2024  CHIEF COMPLAINT: f/u of rectal cancer  CURRENT THERAPY:  Neoadjuvant concurrent chemoradiation with 5-FU  Oncology History   Rectal adenocarcinoma (HCC) [id number], stage IIIB, G2, MMR normal - Diagnosed on November 22, 2024.  Patient presented with severe rectal bleeding to the hospital, required multiple blood transfusion.  Endoscopy showed a mass in the anal canal and extending to the distal rectum, they confirmed adenocarcinoma, MMR normal. -Started concurrent chemoradiation with 5-FU continuous infusion in the hospital on 11/27/2024.   Assessment & Plan Rectal cancer She is undergoing concurrent chemoradiation for rectal cancer. The tumor is externally visible. There has been a reduction in rectal bleeding, indicating possible early treatment response. She missed two chemotherapy session this week, which could affect treatment efficacy and I encouraged her to be on time for her appointments. No missed radiation sessions. Bowel symptoms have improved, but significant tumor shrinkage is not expected until several months into therapy. - Administered chemotherapy for three days this week due to missed appointment. - Reinforced the importance of adherence to chemotherapy schedule to optimize treatment efficacy. - Confirmed uninterrupted continuation of radiation therapy. - Scheduled five days of chemotherapy for next week. - Assessed for symptoms and tumor response, including rectal bleeding and changes in bowel movements. - Provided anticipatory guidance regarding expected timeline for tumor shrinkage and positive significance of symptom improvement.  Chemotherapy-induced diarrhea She experienced one episode of severe diarrhea  during treatment, which resolved with loperamide. Currently, she has soft, easily passed stools and improved appetite. - Assessed bowel movement frequency and consistency. - Confirmed resolution of diarrhea with loperamide.  Chemotherapy-induced cytopenias She has mild leukopenia and thrombocytopenia secondary to chemotherapy, with improvement in blood counts compared to prior labs. Cytopenias are not severe enough to interrupt treatment. Potassium level is mildly decreased, but she remains asymptomatic and is consuming potassium-rich foods. Decreased rectal bleeding likely reflects both improved counts and tumor response. - Reviewed recent blood counts, noting mild leukopenia and thrombocytopenia. - Assessed potassium level and dietary intake. - Encouraged continued consumption of potassium-rich foods such as bananas and oranges.  Plan - She is tolerating chemoradiation well - Will start week five 5-FU today, with pump DC after 3 days. - Follow-up next Monday before next cycle chemo.  SUMMARY OF ONCOLOGIC HISTORY: Oncology History  Rectal adenocarcinoma (HCC)  11/22/2024 Cancer Staging   Staging form: Colon and Rectum, AJCC 8th Edition - Clinical stage from 11/22/2024: Stage IIIB (cT3, cN1b, cM0) - Signed by Lanny Callander, MD on 12/02/2024 Stage prefix: Initial diagnosis Histologic grade (G): G2 Histologic grading system: 4 grade system   11/23/2024 Initial Diagnosis   Rectal adenocarcinoma (HCC)   11/27/2024 -  Chemotherapy   Patient is on Treatment Plan : RECTUM 5FU IVCI D1-5 (225) + XRT        Discussed the use of AI scribe software for clinical note transcription with the patient, who gave verbal consent to proceed.  History of Present Illness Mary Stephens is a 69 year old female with rectal cancer undergoing concurrent chemoradiation who presents for follow-up during active treatment.  She is receiving chemoradiation with planned chemotherapy five days per week. This week  she missed one chemotherapy session and will receive three days, but has received all planned radiation.  Prior significant rectal bleeding has resolved, and she currently has no bleeding. Bowel movements are soft without pain. She had one episode of severe diarrhea that improved with loperamide. Her appetite had fluctuated but is improving since starting loperamide.  She has mild leukopenia and thrombocytopenia with improved counts compared with prior values. Her potassium is slightly low, and she is increasing intake of potassium-rich foods.     All other systems were reviewed with the patient and are negative.  MEDICAL HISTORY:  Past Medical History:  Diagnosis Date   Anxiety    Arthritis    Cataract    forming- very small   Colon polyps    Depression    Diabetes mellitus without complication (HCC)    off meds for diabetes- diet controlled   GERD (gastroesophageal reflux disease)    H/O aortic dissection 2021   Hepatitis C    Hyperlipidemia    Hypertension    Neuropathy    feet    SURGICAL HISTORY: Past Surgical History:  Procedure Laterality Date   COLONOSCOPY  ?2001,2011?   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 (N/A)  12/02/2020   TEE  WITHOUT CARDIOVERSION  12/02/2020   Procedure: TRANSESOPHAGEAL ECHOCARDIOGRAM (TEE);  Surgeon: Shyrl Linnie KIDD, MD;  Location: [facility] OR;  Service: Vascular;;   TRANSESOPHAGEAL ECHOCARDIOGRAM (CATH LAB) N/A 04/17/2024   Procedure: TRANSESOPHAGEAL ECHOCARDIOGRAM;  Surgeon: Shlomo Wilbert SAUNDERS, MD;  Location: [facility] INVASIVE CV LAB;  Service: Cardiovascular;  Laterality: N/A;    I have reviewed the social history and family history with the patient and they are unchanged from previous note.  ALLERGIES:  has no known allergies.  MEDICATIONS:  Current Outpatient Medications  Medication Sig Dispense Refill   acetaminophen  (TYLENOL ) 500 MG tablet Take 500-1,000 mg by mouth every 8 (eight) hours as needed (for pain).     amLODipine  (NORVASC ) 10 MG tablet Take 1 tablet (10 mg total) by mouth daily. 30 tablet 1   [Paused] aspirin  EC 81 MG tablet Take 1 tablet (81 mg total) by mouth daily. Swallow whole. 90 tablet 3   atorvastatin  (LIPITOR) 40 MG tablet Take 40 mg by mouth at bedtime.     [Paused] furosemide  (LASIX ) 40 MG tablet Take 1 tablet (40 mg total) by mouth daily. 30 tablet 1   gabapentin  (NEURONTIN ) 300 MG capsule Take 300 mg by mouth daily.     hydrALAZINE  (APRESOLINE ) 100 MG tablet Take 100 mg by mouth 3 (three) times daily.     LORazepam  (ATIVAN ) 1 MG tablet Take 1 tablet (1 mg total) by mouth every 4 (four) hours as needed for anxiety. 30 tablet 0   metoprolol  tartrate (LOPRESSOR ) 50 MG tablet Take 50 mg by mouth 2 (two) times daily.     ondansetron  (ZOFRAN -ODT) 4 MG disintegrating tablet Take 1 tablet (4  mg total) by mouth every 8 (eight) hours as needed for nausea or vomiting. 20 tablet 0   oxyCODONE  (OXY IR/ROXICODONE ) 5 MG immediate release tablet Take 1 tablet (5 mg total) by mouth every 6 (six) hours as needed for moderate pain (pain score 4-6). 30 tablet 0   polyethylene glycol powder (GLYCOLAX /MIRALAX ) 17 GM/SCOOP powder Take 17 g by mouth daily as needed for mild constipation. Dissolve 1  capful (17g) in 4-8 ounces of liquid and take by mouth daily. 238 g 0   senna-docusate (SENOKOT-S) 8.6-50 MG tablet Take 1 tablet by mouth 2 (two) times daily. 60 tablet 0   triamcinolone  cream (KENALOG ) 0.1 % Apply 1 Application topically 2 (two) times daily. 454 g 0   No current facility-administered medications for this visit.   Facility-Administered Medications Ordered in Other Visits  Medication Dose Route Frequency Provider Last Rate Last Admin   0.9 %  sodium chloride  infusion   Intravenous Continuous Lanny Callander, MD   Stopped at 12/26/24 1217   fluorouracil  (ADRUCIL ) 1,150 mg in sodium chloride  0.9 % 127 mL chemo infusion  225 mg/m2/day (Treatment Plan Recorded) Intravenous 3 days Lanny Callander, MD   Infusion Verify at 12/26/24 1323    PHYSICAL EXAMINATION: ECOG PERFORMANCE STATUS: 1 - Symptomatic but completely ambulatory  There were no vitals filed for this visit. Wt Readings from Last 3 Encounters:  12/26/24 127 lb 8 oz (57.8 kg)  12/03/24 133 lb 8 oz (60.6 kg)  11/21/24 134 lb 11.2 oz (61.1 kg)     GENERAL:alert, no distress and comfortable SKIN: skin color, texture, turgor are normal, no rashes or significant lesions EYES: normal, Conjunctiva are pink and non-injected, sclera clear NECK: supple, thyroid  normal size, non-tender, without nodularity LYMPH:  no palpable lymphadenopathy in the cervical, axillary  LUNGS: clear to auscultation and percussion with normal breathing effort HEART: regular rate & rhythm and no murmurs and no lower extremity edema ABDOMEN:abdomen soft, non-tender and normal bowel sounds Musculoskeletal:no cyanosis of digits and no clubbing  NEURO: alert & oriented x 3 with fluent speech, no focal motor/sensory deficits  Physical Exam    LABORATORY DATA:  I have reviewed the data as listed    Latest Ref Rng & Units 12/26/2024   10:19 AM 12/17/2024    2:18 PM 12/03/2024   12:40 PM  CBC  WBC 4.0 - 10.5 K/uL 2.9  3.4  5.1   Hemoglobin 12.0 - 15.0  g/dL 9.2  9.2  8.4   Hematocrit 36.0 - 46.0 % 27.8  28.1  25.7   Platelets 150 - 400 K/uL 143  139  213         Latest Ref Rng & Units 12/26/2024   10:19 AM 12/17/2024    2:18 PM 12/03/2024   12:40 PM  CMP  Glucose 70 - 99 mg/dL 881  94  884   BUN 8 - 23 mg/dL 30  19  35   Creatinine 0.44 - 1.00 mg/dL 5.99  6.70  6.24   Sodium 135 - 145 mmol/L 143  145  144   Potassium 3.5 - 5.1 mmol/L 3.4  3.9  4.0   Chloride 98 - 111 mmol/L 109  110  110   CO2 22 - 32 mmol/L 20  23  22    Calcium  8.9 - 10.3 mg/dL 8.7  9.2  8.4   Total Protein 6.5 - 8.1 g/dL 6.4  7.0  6.1   Total Bilirubin 0.0 - 1.2 mg/dL <9.7  0.3  0.3   Alkaline Phos 38 - 126 U/L 50  55  51   AST 15 - 41 U/L 15  18  17    ALT 0 - 44 U/L 8  7  7        RADIOGRAPHIC STUDIES: I have personally reviewed the radiological images as listed and agreed with the findings in the report. No results found.    No orders of the defined types were placed in this encounter.  All questions were answered. The patient knows to call the clinic with any problems, questions or concerns. No barriers to learning was detected. The total time spent in the appointment was 25 minutes, including review of chart and various tests results, discussions about plan of care and coordination of care plan     Onita Mattock, MD 12/26/2024     "

## 2024-12-26 NOTE — Patient Instructions (Signed)
 [facility] - A DEPT OF [facility]  Discharge Instructions: Thank you for choosing [facility] to provide your oncology and hematology care.   If you have a lab appointment with the Cancer Center, please go directly to the Cancer Center and check in at the registration area.   Wear comfortable clothing and clothing appropriate for easy access to any Portacath or PICC line.   We strive to give you quality time with your provider. You may need to reschedule your appointment if you arrive late (15 or more minutes).  Arriving late affects you and other patients whose appointments are after yours.  Also, if you miss three or more appointments without notifying the office, you may be dismissed from the clinic at the provider's discretion.      For prescription refill requests, have your pharmacy contact our office and allow 72 hours for refills to be completed.    Today you received the following chemotherapy and/or immunotherapy agents: Fluorouracil .      To help prevent nausea and vomiting after your treatment, we encourage you to take your nausea medication as directed.  BELOW ARE SYMPTOMS THAT SHOULD BE REPORTED IMMEDIATELY: *FEVER GREATER THAN 100.4 F (38 C) OR HIGHER *CHILLS OR SWEATING *NAUSEA AND VOMITING THAT IS NOT CONTROLLED WITH YOUR NAUSEA MEDICATION *UNUSUAL SHORTNESS OF BREATH *UNUSUAL BRUISING OR BLEEDING *URINARY PROBLEMS (pain or burning when urinating, or frequent urination) *BOWEL PROBLEMS (unusual diarrhea, constipation, pain near the anus) TENDERNESS IN MOUTH AND THROAT WITH OR WITHOUT PRESENCE OF ULCERS (sore throat, sores in mouth, or a toothache) UNUSUAL RASH, SWELLING OR PAIN  UNUSUAL VAGINAL DISCHARGE OR ITCHING   Items with * indicate a potential emergency and should be followed up as soon as possible or go to the Emergency Department if any problems should occur.  Please show the CHEMOTHERAPY ALERT CARD or  IMMUNOTHERAPY ALERT CARD at check-in to the Emergency Department and triage nurse.  Should you have questions after your visit or need to cancel or reschedule your appointment, please contact [facility] - A DEPT OF [facility]  Dept: [phone]  and follow the prompts.  Office hours are 8:00 a.m. to 4:30 p.m. Monday - Friday. Please note that voicemails left after 4:00 p.m. may not be returned until the following business day.  We are closed weekends and major holidays. You have access to a nurse at all times for urgent questions. Please call the main number to the clinic Dept: [phone] and follow the prompts.   For any non-urgent questions, you may also contact your provider using MyChart. We now offer e-Visits for anyone 21 and older to request care online for non-urgent symptoms. For details visit [email or web address].   Also download the MyChart app! Go to the app store, search MyChart, open the app, select [facility], and log in with your MyChart username and password.

## 2024-12-27 ENCOUNTER — Other Ambulatory Visit: Payer: Self-pay

## 2024-12-27 ENCOUNTER — Ambulatory Visit: Payer: Medicare (Managed Care)

## 2024-12-27 ENCOUNTER — Ambulatory Visit
Admission: RE | Admit: 2024-12-27 | Discharge: 2024-12-27 | Disposition: A | Payer: Medicare (Managed Care) | Source: Ambulatory Visit | Attending: Radiation Oncology

## 2024-12-27 LAB — RAD ONC ARIA SESSION SUMMARY
Course Elapsed Days: 30
Plan Fractions Treated to Date: 21
Plan Prescribed Dose Per Fraction: 1.8 Gy
Plan Total Fractions Prescribed: 25
Plan Total Prescribed Dose: 45 Gy
Reference Point Dosage Given to Date: 37.8 Gy
Reference Point Session Dosage Given: 1.8 Gy
Session Number: 21

## 2024-12-28 ENCOUNTER — Encounter: Payer: Self-pay | Admitting: Hematology

## 2024-12-28 ENCOUNTER — Ambulatory Visit: Payer: Medicare (Managed Care)

## 2024-12-28 ENCOUNTER — Ambulatory Visit
Admission: RE | Admit: 2024-12-28 | Discharge: 2024-12-28 | Disposition: A | Discharge status: Home / Self Care | Payer: Medicare (Managed Care) | Source: Ambulatory Visit | Attending: Radiation Oncology | Admitting: Radiation Oncology

## 2024-12-28 ENCOUNTER — Other Ambulatory Visit: Payer: Self-pay

## 2024-12-28 ENCOUNTER — Encounter (HOSPITAL_COMMUNITY): Payer: Self-pay | Admitting: Internal Medicine

## 2024-12-28 LAB — RAD ONC ARIA SESSION SUMMARY
Course Elapsed Days: 31
Plan Fractions Treated to Date: 22
Plan Prescribed Dose Per Fraction: 1.8 Gy
Plan Total Fractions Prescribed: 25
Plan Total Prescribed Dose: 45 Gy
Reference Point Dosage Given to Date: 39.6 Gy
Reference Point Session Dosage Given: 1.8 Gy
Session Number: 22

## 2024-12-29 ENCOUNTER — Inpatient Hospital Stay: Payer: Medicare (Managed Care)

## 2024-12-29 VITALS — BP 132/86 | HR 76 | Temp 97.5°F | Resp 18

## 2024-12-29 DIAGNOSIS — C20 Malignant neoplasm of rectum: Secondary | ICD-10-CM

## 2024-12-29 MED ORDER — SODIUM CHLORIDE 0.9% FLUSH
10.0000 mL | INTRAVENOUS | Status: DC | PRN
  Administered 2024-12-29: 10 mL

## 2024-12-30 NOTE — Assessment & Plan Note (Signed)
 [id number], stage IIIB, G2, MMR normal - Diagnosed on November 22, 2024.  Patient presented with severe rectal bleeding to the hospital, required multiple blood transfusion.  Endoscopy showed a mass in the anal canal and extending to the distal rectum, they confirmed adenocarcinoma, MMR normal. -Started concurrent chemoradiation with 5-FU continuous infusion in the hospital on 11/27/2024.

## 2024-12-31 ENCOUNTER — Inpatient Hospital Stay: Payer: Medicare (Managed Care) | Admitting: Hematology

## 2024-12-31 ENCOUNTER — Inpatient Hospital Stay: Payer: Medicare (Managed Care)

## 2024-12-31 ENCOUNTER — Ambulatory Visit: Payer: Medicare (Managed Care)

## 2024-12-31 ENCOUNTER — Telehealth: Payer: Self-pay

## 2024-12-31 ENCOUNTER — Other Ambulatory Visit: Payer: Self-pay

## 2024-12-31 ENCOUNTER — Ambulatory Visit
Admission: RE | Admit: 2024-12-31 | Discharge: 2024-12-31 | Disposition: A | Payer: Medicare (Managed Care) | Source: Ambulatory Visit | Attending: Radiation Oncology

## 2024-12-31 VITALS — BP 137/79 | HR 77 | Temp 97.3°F | Resp 17 | Wt 130.8 lb

## 2024-12-31 DIAGNOSIS — C20 Malignant neoplasm of rectum: Secondary | ICD-10-CM

## 2024-12-31 LAB — CBC WITH DIFFERENTIAL (CANCER CENTER ONLY)
Abs Immature Granulocytes: 0.01 K/uL (ref 0.00–0.07)
Basophils Absolute: 0 K/uL (ref 0.0–0.1)
Basophils Relative: 0 %
Eosinophils Absolute: 0.3 K/uL (ref 0.0–0.5)
Eosinophils Relative: 8 %
HCT: 28.6 % — ABNORMAL LOW (ref 36.0–46.0)
Hemoglobin: 9.5 g/dL — ABNORMAL LOW (ref 12.0–15.0)
Immature Granulocytes: 0 %
Lymphocytes Relative: 5 %
Lymphs Abs: 0.2 K/uL — ABNORMAL LOW (ref 0.7–4.0)
MCH: 31.6 pg (ref 26.0–34.0)
MCHC: 33.2 g/dL (ref 30.0–36.0)
MCV: 95 fL (ref 80.0–100.0)
Monocytes Absolute: 0.6 K/uL (ref 0.1–1.0)
Monocytes Relative: 17 %
Neutro Abs: 2.4 K/uL (ref 1.7–7.7)
Neutrophils Relative %: 70 %
Platelet Count: 180 K/uL (ref 150–400)
RBC: 3.01 MIL/uL — ABNORMAL LOW (ref 3.87–5.11)
RDW: 17.7 % — ABNORMAL HIGH (ref 11.5–15.5)
WBC Count: 3.4 K/uL — ABNORMAL LOW (ref 4.0–10.5)
nRBC: 0 % (ref 0.0–0.2)

## 2024-12-31 LAB — CMP (CANCER CENTER ONLY)
ALT: 7 U/L (ref 0–44)
AST: 17 U/L (ref 15–41)
Albumin: 4.3 g/dL (ref 3.5–5.0)
Alkaline Phosphatase: 51 U/L (ref 38–126)
Anion gap: 13 (ref 5–15)
BUN: 32 mg/dL — ABNORMAL HIGH (ref 8–23)
CO2: 20 mmol/L — ABNORMAL LOW (ref 22–32)
Calcium: 9.1 mg/dL (ref 8.9–10.3)
Chloride: 113 mmol/L — ABNORMAL HIGH (ref 98–111)
Creatinine: 3.7 mg/dL — ABNORMAL HIGH (ref 0.44–1.00)
GFR, Estimated: 13 mL/min — ABNORMAL LOW
Glucose, Bld: 111 mg/dL — ABNORMAL HIGH (ref 70–99)
Potassium: 4 mmol/L (ref 3.5–5.1)
Sodium: 147 mmol/L — ABNORMAL HIGH (ref 135–145)
Total Bilirubin: 0.3 mg/dL (ref 0.0–1.2)
Total Protein: 6.9 g/dL (ref 6.5–8.1)

## 2024-12-31 LAB — RAD ONC ARIA SESSION SUMMARY
Course Elapsed Days: 34
Plan Fractions Treated to Date: 23
Plan Prescribed Dose Per Fraction: 1.8 Gy
Plan Total Fractions Prescribed: 25
Plan Total Prescribed Dose: 45 Gy
Reference Point Dosage Given to Date: 41.4 Gy
Reference Point Session Dosage Given: 1.8 Gy
Session Number: 23

## 2024-12-31 MED ORDER — SODIUM CHLORIDE 0.9 % IV SOLN: INTRAVENOUS | Status: DC

## 2024-12-31 MED ORDER — DIPHENOXYLATE-ATROPINE 2.5-0.025 MG PO TABS: 1.0000 | ORAL_TABLET | Freq: Four times a day (QID) | ORAL | 0 refills | Status: AC | PRN

## 2024-12-31 MED ORDER — SODIUM CHLORIDE 0.9 % IV SOLN
225.0000 mg/m2/d | INTRAVENOUS | Status: DC
  Administered 2024-12-31: 2000 mg via INTRAVENOUS
  Filled 2024-12-31: qty 40

## 2024-12-31 MED ORDER — SODIUM CHLORIDE 0.9 % IV SOLN: INTRAVENOUS | Status: AC

## 2024-12-31 NOTE — Telephone Encounter (Signed)
 Called pt due to her being arrived and not able to be found. Pt daughter answered and informed us  that the pt primary phone number is  hers and that she was dropped off by a transportation company. And stated she would call to see where she was dropped off at and would call to inform us  where she is.

## 2024-12-31 NOTE — Patient Instructions (Signed)
 [facility] - A DEPT OF [facility]  Discharge Instructions: Thank you for choosing [facility] to provide your oncology and hematology care.   If you have a lab appointment with the Cancer Center, please go directly to the Cancer Center and check in at the registration area.   Wear comfortable clothing and clothing appropriate for easy access to any Portacath or PICC line.   We strive to give you quality time with your provider. You may need to reschedule your appointment if you arrive late (15 or more minutes).  Arriving late affects you and other patients whose appointments are after yours.  Also, if you miss three or more appointments without notifying the office, you may be dismissed from the clinic at the provider's discretion.      For prescription refill requests, have your pharmacy contact our office and allow 72 hours for refills to be completed.    Today you received the following chemotherapy and/or immunotherapy agents: Fluorouracil .      To help prevent nausea and vomiting after your treatment, we encourage you to take your nausea medication as directed.  BELOW ARE SYMPTOMS THAT SHOULD BE REPORTED IMMEDIATELY: *FEVER GREATER THAN 100.4 F (38 C) OR HIGHER *CHILLS OR SWEATING *NAUSEA AND VOMITING THAT IS NOT CONTROLLED WITH YOUR NAUSEA MEDICATION *UNUSUAL SHORTNESS OF BREATH *UNUSUAL BRUISING OR BLEEDING *URINARY PROBLEMS (pain or burning when urinating, or frequent urination) *BOWEL PROBLEMS (unusual diarrhea, constipation, pain near the anus) TENDERNESS IN MOUTH AND THROAT WITH OR WITHOUT PRESENCE OF ULCERS (sore throat, sores in mouth, or a toothache) UNUSUAL RASH, SWELLING OR PAIN  UNUSUAL VAGINAL DISCHARGE OR ITCHING   Items with * indicate a potential emergency and should be followed up as soon as possible or go to the Emergency Department if any problems should occur.  Please show the CHEMOTHERAPY ALERT CARD or  IMMUNOTHERAPY ALERT CARD at check-in to the Emergency Department and triage nurse.  Should you have questions after your visit or need to cancel or reschedule your appointment, please contact [facility] - A DEPT OF [facility]  Dept: [phone]  and follow the prompts.  Office hours are 8:00 a.m. to 4:30 p.m. Monday - Friday. Please note that voicemails left after 4:00 p.m. may not be returned until the following business day.  We are closed weekends and major holidays. You have access to a nurse at all times for urgent questions. Please call the main number to the clinic Dept: [phone] and follow the prompts.   For any non-urgent questions, you may also contact your provider using MyChart. We now offer e-Visits for anyone 21 and older to request care online for non-urgent symptoms. For details visit [email or web address].   Also download the MyChart app! Go to the app store, search MyChart, open the app, select [facility], and log in with your MyChart username and password.

## 2024-12-31 NOTE — Telephone Encounter (Signed)
 Called pt daughter back to let her know we've found the pt and no need to return the call from earlier.

## 2024-12-31 NOTE — Progress Notes (Signed)
 " [facility]   Telephone:(336) [phone] Fax:(336) [phone]   Clinic Follow up Note   Patient Care Team: Cloria Annabella CROME, DO as PCP - General (Geriatric Medicine) Lavona Agent, MD as PCP - Cardiology (Cardiology)  Date of Service:  12/31/2024  CHIEF COMPLAINT: f/u of rectal cancer  CURRENT THERAPY:  Concurrent chemoradiation with 5-FU pump infusion  Oncology History   Rectal adenocarcinoma (HCC) [id number], stage IIIB, G2, MMR normal - Diagnosed on November 22, 2024.  Patient presented with severe rectal bleeding to the hospital, required multiple blood transfusion.  Endoscopy showed a mass in the anal canal and extending to the distal rectum, they confirmed adenocarcinoma, MMR normal. -Started concurrent chemoradiation with 5-FU continuous infusion in the hospital on 11/27/2024.   Assessment & Plan Rectal adenocarcinoma She is undergoing chemoradiation, having recently completed radiation and continuing chemotherapy. There is both subjective and objective evidence of tumor response, with reduction in tumor size. She experiences significant gastrointestinal toxicity but remains motivated and able to arrange transportation independently. She missed two days of treatment last week due to confusion about appointment locations but has otherwise been adherent. - Continued chemotherapy as scheduled, with monitoring for tolerability. - Assessed tumor response by physical examination, confirming reduction in size. - Administered 500 mL normal saline infusion over one hour to support hydration during chemotherapy. - Provided printed appointment calendar to assist with scheduling and adherence. - Scheduled weekly follow-up visits to monitor progress and side effects. - Instructed her to bring her medication list from her primary care provider to the next visit.  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 Diarrhea is inadequately controlled with current loperamide regimen. She tolerates only fruit and is at risk for significant weight loss. Nausea occurs, especially with drinking. She has not previously seen a dietitian but is willing to try nutritional supplements and dietary modifications. - Instructed her to increase loperamide dosing to up to two tablets, four times daily as needed, and to take loperamide prior to meals. - Prescribed diphenoxylate -atropine  (Lomotil ) as second-line therapy if loperamide is insufficient. - Referred urgently to dietitian for nutritional assessment and support. - Recommended trial of liquid nutritional supplements (Ensure or Boost). - Provided dietary guidance to include protein sources such as beans, chicken, beef, and fish.  Perianal radiation dermatitis and pain She has perianal skin irritation and pain secondary to ongoing chemoradiation and diarrhea. The skin is raw but without ulceration or breakdown. She uses topical cream for symptomatic relief and has oxycodone  available for pain control. No new skin ulcers were identified on examination. - Assessed perianal area for skin breakdown and ulceration; no ulceration present. - Advised continued use of topical cream for burning and irritation. - Recommended oxycodone , half tablet as needed for pain. - Instructed her to contact clinic if pain control is inadequate or if she runs out of medication.  Plan - Lab reviewed, adequate for treatment, will start 5-FU pump infusion again today, for the next 5 days, with pump DC on Saturday. - I encouraged her to use more Imodium for diarrhea, and I called in Lomotil  for her. - Urgent nutrition consult - Follow-up with close today, I plan to see her back again on Thursday this week.     SUMMARY OF ONCOLOGIC HISTORY: Oncology History  Rectal adenocarcinoma (HCC)  11/22/2024 Cancer Staging   Staging form: Colon and  Rectum, AJCC 8th Edition - Clinical stage from 11/22/2024: Stage  IIIB (cT3, cN1b, cM0) - Signed by Lanny Callander, MD on 12/02/2024 Stage prefix: Initial diagnosis Histologic grade (G): G2 Histologic grading system: 4 grade system   11/23/2024 Initial Diagnosis   Rectal adenocarcinoma (HCC)   11/27/2024 -  Chemotherapy   Patient is on Treatment Plan : RECTUM 5FU IVCI D1-5 (225) + XRT        Discussed the use of AI scribe software for clinical note transcription with the patient, who gave verbal consent to proceed.  History of Present Illness Mary Stephens is a 69 year old female with rectal cancer undergoing concurrent chemoradiation who presents for follow-up during active treatment complicated by severe chemotherapy-induced diarrhea.  She is undergoing chemoradiation for rectal cancer and recently completed her latest course of radiation. She is scheduled to resume chemotherapy infusion today and complete the current cycle on Saturday. She missed two treatment days last week due to confusion about appointment locations despite being on site. She has a urinary catheter and a PICC line for chemotherapy.  She has severe, persistent diarrhea triggered by any oral intake, which has led to prolonged periods without eating and significant distress. Even thinking about food can precipitate urgent bowel movements. She tolerates only fruit. She uses loperamide, one tablet after each episode, typically once daily, with poor symptom control. She also has intermittent nausea, worse with liquids. She has not seen a dietitian.  She has marked perianal discomfort described as rawness, burning, and discharge. She applies topical cream, sometimes in large amounts, and uses oxycodone  for skin-related pain, with about six tablets remaining. Symptoms of rawness, burning, and discharge are persistent.  She lives alone and uses taxis for transportation to appointments. She has difficulty tracking appointments and  would benefit from a printed calendar. She is able to obtain medications from the pharmacy through [facility] or [facility] programs.     All other systems were reviewed with the patient and are negative.  MEDICAL HISTORY:  Past Medical History:  Diagnosis Date   Anxiety    Arthritis    Cataract    forming- very small   Colon polyps    Depression    Diabetes mellitus without complication (HCC)    off meds for diabetes- diet controlled   GERD (gastroesophageal reflux disease)    H/O aortic dissection 2021   Hepatitis C    Hyperlipidemia    Hypertension    Neuropathy    feet    SURGICAL HISTORY: Past Surgical History:  Procedure Laterality Date   COLONOSCOPY  ?2001,2011?   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 (N/A)  12/02/2020   TEE WITHOUT CARDIOVERSION  12/02/2020   Procedure: TRANSESOPHAGEAL ECHOCARDIOGRAM (TEE);  Surgeon: Shyrl Linnie KIDD, MD;  Location: [facility] OR;  Service: Vascular;;   TRANSESOPHAGEAL ECHOCARDIOGRAM (CATH LAB) N/A 04/17/2024   Procedure: TRANSESOPHAGEAL ECHOCARDIOGRAM;  Surgeon: Shlomo Wilbert SAUNDERS, MD;  Location: [facility] INVASIVE CV LAB;  Service: Cardiovascular;  Laterality: N/A;    I have reviewed the social history and family history with  the patient and they are unchanged from previous note.  ALLERGIES:  has no known allergies.  MEDICATIONS:  Current Outpatient Medications  Medication Sig Dispense Refill   acetaminophen  (TYLENOL ) 500 MG tablet Take 500-1,000 mg by mouth every 8 (eight) hours as needed (for pain).     amLODipine  (NORVASC ) 10 MG tablet Take 1 tablet (10 mg total) by mouth daily. 30 tablet 1   [Paused] aspirin  EC 81 MG tablet Take 1 tablet (81 mg total) by mouth daily. Swallow whole. 90 tablet 3   atorvastatin  (LIPITOR) 40 MG tablet Take 40 mg by mouth at bedtime.     diphenoxylate -atropine  (LOMOTIL ) 2.5-0.025 MG tablet Take 1-2 tablets by mouth 4 (four) times daily as needed for diarrhea or loose stools (if immodium not enough for diarrhea). 30 tablet 0   [Paused] furosemide  (LASIX ) 40 MG tablet Take 1 tablet (40 mg total) by mouth daily. 30 tablet 1   gabapentin  (NEURONTIN ) 300 MG capsule Take 300 mg by mouth daily.     hydrALAZINE  (APRESOLINE ) 100 MG tablet Take 100 mg by mouth 3 (three) times daily.     LORazepam  (ATIVAN ) 1 MG tablet Take 1 tablet (1 mg total) by mouth every 4 (four) hours as needed for anxiety. 30 tablet 0   metoprolol  tartrate (LOPRESSOR ) 50 MG tablet Take 50 mg by mouth 2 (two) times daily.     ondansetron  (ZOFRAN -ODT) 4 MG disintegrating tablet Take 1 tablet (4 mg total) by mouth every 8 (eight) hours as needed for nausea or vomiting. 20 tablet 0   oxyCODONE  (OXY IR/ROXICODONE ) 5 MG immediate release tablet Take 1 tablet (5 mg total) by mouth every 6 (six) hours as needed for moderate pain (pain score 4-6). 30 tablet 0   polyethylene glycol powder (GLYCOLAX /MIRALAX ) 17 GM/SCOOP powder Take 17 g by mouth daily as needed for mild constipation. Dissolve 1 capful (17g) in 4-8 ounces of liquid and take by mouth daily. 238 g 0   senna-docusate (SENOKOT-S) 8.6-50 MG tablet Take 1 tablet by mouth 2 (two) times daily. 60 tablet 0   triamcinolone  cream (KENALOG ) 0.1 % Apply 1 Application topically 2  (two) times daily. 454 g 0   No current facility-administered medications for this visit.    PHYSICAL EXAMINATION: ECOG PERFORMANCE STATUS: 2 - Symptomatic, <50% confined to bed  Vitals:   12/31/24 1341 12/31/24 1343  BP: (!) 143/85 137/79  Pulse: 77   Resp: 17   Temp: (!) 97.3 F (36.3 C)   SpO2: 100%    Wt Readings from Last 3 Encounters:  12/31/24 130 lb 12.8 oz (59.3 kg)  12/26/24 127 lb 8 oz (57.8 kg)  12/03/24 133 lb 8 oz (60.6 kg)     GENERAL:alert, no distress and comfortable SKIN: skin color, texture, turgor are normal, no rashes or significant lesions EYES: normal, Conjunctiva are pink and non-injected, sclera clear NECK: supple, thyroid  normal size, non-tender, without nodularity LYMPH:  no palpable lymphadenopathy in the cervical, axillary  LUNGS: clear to auscultation and percussion with normal breathing effort HEART: regular rate & rhythm and no murmurs and no lower extremity edema ABDOMEN:abdomen soft, non-tender and normal bowel sounds Musculoskeletal:no cyanosis of digits and no clubbing  NEURO: alert & oriented x 3 with fluent speech, no focal motor/sensory deficits RECTAL: perianal rectal tumor smaller in size.  No ulcer or skin breakdown in the perianal area Physical Exam   LABORATORY DATA:  I have reviewed the data as listed    Latest Ref Rng & Units 12/31/2024   12:47 PM 12/26/2024   10:19 AM 12/17/2024    2:18 PM  CBC  WBC 4.0 - 10.5 K/uL 3.4  2.9  3.4   Hemoglobin 12.0 - 15.0 g/dL 9.5  9.2  9.2   Hematocrit 36.0 - 46.0 % 28.6  27.8  28.1   Platelets 150 - 400 K/uL 180  143  139         Latest Ref Rng & Units 12/31/2024   12:47 PM 12/26/2024   10:19 AM 12/17/2024    2:18 PM  CMP  Glucose 70 - 99 mg/dL 888  881  94   BUN 8 - 23 mg/dL 32  30  19   Creatinine 0.44 - 1.00 mg/dL 6.29  5.99  6.70   Sodium 135 - 145 mmol/L 147  143  145   Potassium 3.5 - 5.1 mmol/L 4.0  3.4  3.9   Chloride 98 - 111 mmol/L 113  109  110   CO2 22 - 32 mmol/L 20   20  23    Calcium  8.9 - 10.3 mg/dL 9.1  8.7  9.2   Total Protein 6.5 - 8.1 g/dL 6.9  6.4  7.0   Total Bilirubin 0.0 - 1.2 mg/dL 0.3  <9.7  0.3   Alkaline Phos 38 - 126 U/L 51  50  55   AST 15 - 41 U/L 17  15  18    ALT 0 - 44 U/L 7  8  7        RADIOGRAPHIC STUDIES: I have personally reviewed the radiological images as listed and agreed with the findings in the report. No results found.    Orders Placed This Encounter  Procedures   Ambulatory Referral to [facility] Nutrition    Referral Priority:   Routine    Referral Type:   Consultation    Referral Reason:   Specialty Services Required    Number of Visits Requested:   1   All questions were answered. The patient knows to call the clinic with any problems, questions or concerns. No barriers to learning was detected. The total time spent in the appointment was 25 minutes, including review of chart and various tests results, discussions about plan of care and coordination of care plan     Onita Mattock, MD 12/31/2024     "

## 2025-01-01 ENCOUNTER — Other Ambulatory Visit: Payer: Self-pay | Admitting: Hematology

## 2025-01-01 ENCOUNTER — Other Ambulatory Visit: Payer: Self-pay

## 2025-01-01 ENCOUNTER — Ambulatory Visit
Admission: RE | Admit: 2025-01-01 | Discharge: 2025-01-01 | Disposition: A | Payer: Medicare (Managed Care) | Source: Ambulatory Visit | Attending: Radiation Oncology

## 2025-01-01 ENCOUNTER — Ambulatory Visit: Payer: Medicare (Managed Care)

## 2025-01-01 LAB — RAD ONC ARIA SESSION SUMMARY
Course Elapsed Days: 35
Plan Fractions Treated to Date: 24
Plan Prescribed Dose Per Fraction: 1.8 Gy
Plan Total Fractions Prescribed: 25
Plan Total Prescribed Dose: 45 Gy
Reference Point Dosage Given to Date: 43.2 Gy
Reference Point Session Dosage Given: 1.8 Gy
Session Number: 24

## 2025-01-02 ENCOUNTER — Inpatient Hospital Stay: Payer: Medicare (Managed Care)

## 2025-01-02 ENCOUNTER — Other Ambulatory Visit: Payer: Self-pay

## 2025-01-02 ENCOUNTER — Inpatient Hospital Stay: Payer: Medicare (Managed Care) | Admitting: Hematology

## 2025-01-02 ENCOUNTER — Ambulatory Visit: Payer: Medicare (Managed Care)

## 2025-01-02 ENCOUNTER — Ambulatory Visit
Admission: RE | Admit: 2025-01-02 | Discharge: 2025-01-02 | Disposition: A | Payer: Medicare (Managed Care) | Source: Ambulatory Visit | Attending: Radiation Oncology

## 2025-01-02 LAB — RAD ONC ARIA SESSION SUMMARY
Course Elapsed Days: 36
Plan Fractions Treated to Date: 25
Plan Prescribed Dose Per Fraction: 1.8 Gy
Plan Total Fractions Prescribed: 25
Plan Total Prescribed Dose: 45 Gy
Reference Point Dosage Given to Date: 45 Gy
Reference Point Session Dosage Given: 1.8 Gy
Session Number: 25

## 2025-01-03 ENCOUNTER — Ambulatory Visit
Admission: RE | Admit: 2025-01-03 | Discharge: 2025-01-03 | Disposition: A | Discharge status: Home / Self Care | Payer: Medicare (Managed Care) | Source: Ambulatory Visit | Attending: Radiation Oncology | Admitting: Radiation Oncology

## 2025-01-03 ENCOUNTER — Telehealth: Payer: Self-pay

## 2025-01-03 ENCOUNTER — Inpatient Hospital Stay (HOSPITAL_BASED_OUTPATIENT_CLINIC_OR_DEPARTMENT_OTHER): Payer: Medicare (Managed Care) | Admitting: Hematology

## 2025-01-03 ENCOUNTER — Inpatient Hospital Stay: Payer: Medicare (Managed Care) | Admitting: Nutrition

## 2025-01-03 ENCOUNTER — Other Ambulatory Visit: Payer: Self-pay

## 2025-01-03 VITALS — BP 120/70 | HR 71 | Temp 98.3°F | Ht 67.99 in | Wt 130.7 lb

## 2025-01-03 DIAGNOSIS — C20 Malignant neoplasm of rectum: Secondary | ICD-10-CM | POA: Diagnosis not present

## 2025-01-03 LAB — RAD ONC ARIA SESSION SUMMARY
Course Elapsed Days: 37
Plan Fractions Treated to Date: 1
Plan Prescribed Dose Per Fraction: 1.8 Gy
Plan Total Fractions Prescribed: 3
Plan Total Prescribed Dose: 5.4 Gy
Reference Point Dosage Given to Date: 1.8 Gy
Reference Point Session Dosage Given: 1.8 Gy
Session Number: 26

## 2025-01-03 NOTE — Telephone Encounter (Signed)
 Patient arrived to her 1:45pm appt w/ Radiation. Patient did not arrive to her 2:00pm office visit with Dr. Lanny.  Patient was checked in to her 2:00pm appt with Dr. Lanny.  Per registration, patient was given an arm band at check-in. This medical assistant went to the main lobby, the sub-waiting room (blue room), and both patient bathrooms looking for the patient. This medical assistant attempted to contact the patient at [phone], this T# is disconnected or not in service.  This medical assistant contacted, Brandy-patient's daughter at [phone]. Brandy confirmed patient was dropped of at [facility] for her 1:45pm appt. Leotis stated she will contact patient's driver to see if patient has left the facility.  Patient arrived to MED-ON[facility] @2 :10pm from RADIATION. Attempted to contact Brandy to make her aware. Unable to reach [place] @336 [phone].  Left detailed message.  Will carry on with office visit as scheduled.

## 2025-01-03 NOTE — Telephone Encounter (Signed)
 Contacted [employer] [phone]) via telephone call, per Dr. Lanny. LVM requesting Dr. Tobie contact Dr. Lanny on her cell phone [phone]).

## 2025-01-03 NOTE — Progress Notes (Signed)
 69 year old female diagnosed with rectal cancer and followed by Dr. Lanny.  Patient receives neoadjuvant chemo therapy with 5-FU IV CI day 1 through 5 and radiation therapy.  Final radiation treatment scheduled for January 26  Past medical history includes hypertension, hepatitis C, hyperlipidemia, GERD, diabetes, depression, and anxiety.  Medications include Lomotil , Ativan , Zofran , MiraLAX , and Senokot-S  Labs on January 19 include sodium 147, glucose 111, BUN 32, creatinine 3.7  Height: 5 feet 8 inches. Weight: 130 pounds 11.2 oz January 22 Usual body weight: 135-140 pounds BMI: 19.88  Reports she has never been a big person and confirms usual weight of 135-140 pounds. Feels very tired as she gets very little sleep at night because she has to go to the bathroom 5 or 6 times.   Patient has severe chemotherapy-induced diarrhea.  Reports this is triggered by any oral intake.  She tends to have prolonged periods without eating and it is causing her significant distress.  If she even thinks about food she can have an urgent bowel movement.  MD has increased loperamide and has added Lomotil  as needed. Patient will pick up her prescription today.  Nutrition diagnosis: Altered GI function related to cancer and chemotherapy treatments as evidenced by multiple daily stools requiring medication  Intervention: Educated to follow low fiber diet.  Reviewed specific foods to eat foods to avoid.  Eat smaller amounts of food frequently throughout the day. Increase foods to thicken stool. Eat more white rice, bananas and applesauce. Consider Banatrol and provided samples. Increase fluids throughout the day. Try clear liquid oral nutrition supplements to provide additional calories and protein. Gave samples of Boost Breeze. Reviewed importance of increased protein. Take medications as prescribed/directed by MD. Provided nutrition fact sheets.  Questions answered.  Teach back method used.  Contact  information given.  Monitoring, evaluation, goals: Tolerate increased calories protein and fluids to minimize weight loss  Next visit: To be followed as needed.  Encouraged patient to contact RD with questions or concerns.  **Disclaimer: This note was dictated with voice recognition software. Similar sounding words can inadvertently be transcribed and this note may contain transcription errors which may not have been corrected upon publication of note.**

## 2025-01-03 NOTE — Assessment & Plan Note (Signed)
 [id number], stage IIIB, G2, MMR normal - Diagnosed on November 22, 2024.  Patient presented with severe rectal bleeding to the hospital, required multiple blood transfusion.  Endoscopy showed a mass in the anal canal and extending to the distal rectum, they confirmed adenocarcinoma, MMR normal. -Started concurrent chemoradiation with 5-FU continuous infusion in the hospital on 11/27/2024.

## 2025-01-03 NOTE — Progress Notes (Signed)
 Verbal orders w/readback from Dr Lanny to place order to remove pt's PICC line w/pump on 01/05/2025.  Place order for port-a-cath placement w/in the next 2 wks by IR.  Orders placed and notified [facility] IR Team regarding contacting pt to schedule port placement.

## 2025-01-04 ENCOUNTER — Ambulatory Visit
Admission: RE | Admit: 2025-01-04 | Discharge: 2025-01-04 | Disposition: A | Payer: Medicare (Managed Care) | Source: Ambulatory Visit | Attending: Radiation Oncology

## 2025-01-04 ENCOUNTER — Other Ambulatory Visit: Payer: Self-pay

## 2025-01-04 ENCOUNTER — Telehealth: Payer: Self-pay

## 2025-01-04 LAB — RAD ONC ARIA SESSION SUMMARY
Course Elapsed Days: 38
Plan Fractions Treated to Date: 2
Plan Prescribed Dose Per Fraction: 1.8 Gy
Plan Total Fractions Prescribed: 3
Plan Total Prescribed Dose: 5.4 Gy
Reference Point Dosage Given to Date: 3.6 Gy
Reference Point Session Dosage Given: 1.8 Gy
Session Number: 27

## 2025-01-04 NOTE — Telephone Encounter (Signed)
 Dr. Lanny requested to speak with Dr. Gordy Blanch - patient's nephrologist. (See previous telephone message note) Dr. Lanny did not receive a telephone call from Dr. Blanch.  This medical assistant contacted Dr. Anthony office again today. Spoke with office staff member. Office staff member confirmed Dr. Gordy Blanch was in the office/hospital today. Staff member provided Dr. Gordy Patel's cell-phone number to give to Dr. Lanny directly. [phone])

## 2025-01-05 ENCOUNTER — Encounter: Payer: Self-pay | Admitting: Hematology

## 2025-01-05 ENCOUNTER — Inpatient Hospital Stay: Payer: Medicare (Managed Care)

## 2025-01-05 ENCOUNTER — Encounter (HOSPITAL_COMMUNITY): Payer: Self-pay | Admitting: Internal Medicine

## 2025-01-05 VITALS — BP 116/79 | HR 64 | Resp 17

## 2025-01-05 DIAGNOSIS — C20 Malignant neoplasm of rectum: Secondary | ICD-10-CM

## 2025-01-05 MED ORDER — SODIUM CHLORIDE 0.9 % IV SOLN: Freq: Once | INTRAVENOUS | Status: AC

## 2025-01-05 NOTE — Progress Notes (Signed)
 " [facility]   [phone] [phone]   Clinic Follow up Note   Patient Care Team: Cloria Annabella CROME, DO as PCP - General (Geriatric Medicine) Lavona Agent, MD as PCP - Cardiology (Cardiology) Tobie Gordy POUR, MD as Consulting Physician (Nephrology)  Date of Service:  01/03/2025  CHIEF COMPLAINT: f/u of rectal cancer  CURRENT THERAPY:  Concurrent chemoradiation with 5-FU pump infusion  Oncology History   Rectal adenocarcinoma (HCC) [id number], stage IIIB, G2, MMR normal - Diagnosed on November 22, 2024.  Patient presented with severe rectal bleeding to the hospital, required multiple blood transfusion.  Endoscopy showed a mass in the anal canal and extending to the distal rectum, they confirmed adenocarcinoma, MMR normal. -Started concurrent chemoradiation with 5-FU continuous infusion in the hospital on 11/27/2024.   Assessment & Plan Rectal adenocarcinoma Rectal adenocarcinoma, advanced stage, with significant morbidity including pain, diarrhea, and rectal bleeding. She is completing chemoradiation, with radiation scheduled to finish on January 26. Current therapy is not expected to be curative; intravenous chemotherapy will be required for disease control and potential cure. She wishes to pursue further treatment despite side effects, aiming to return to her prior level of functioning. - Cancelled next week's chemotherapy infusion appointment. - Planned a two to three week break post-chemoradiation for recovery. - Planned to initiate intravenous FOLFOX chemotherapy in approximately three weeks. - Discussed and planned port placement for chemotherapy administration in one to two weeks, per her preference for comfort and ease of care. - Planned removal of PICC line after completion of current therapy.  Chemotherapy-induced diarrhea and malnutrition Severe diarrhea and poor oral intake, likely secondary to chemoradiation and underlying rectal  adenocarcinoma, resulting in weight loss and inability to tolerate most foods. Diarrhea persists despite current Imodium dosing. Malnutrition is a concern due to ongoing symptoms and inadequate intake. - Advised to increase Imodium dosing up to four times daily as needed for diarrhea control. - Reminded her to pick up Lomotil  prescription for additional diarrhea management. - Recommended dietitian consultation for nutritional support and guidance on a low fiber, easy-to-digest diet. - Advised to avoid vegetables and fruits, and focus on soft carbohydrates and protein. - Encouraged trial of liquid nutritional supplements, with expectation of receiving samples from dietitian.  Dehydration secondary to diarrhea and poor oral intake At risk for dehydration due to ongoing diarrhea and poor oral intake. She previously benefited from intravenous fluids and continues to have difficulty maintaining hydration. - Scheduled intravenous fluid infusions for this Saturday and next Saturday to support hydration.  Plan - Patient continued to complain diarrhea after meals, anorexia and poor oral intake. - I again instructed her to increase Imodium, and pick up the Lomotil  prescription I called in earlier this week.  She agreed - She initially wanted to stop chemo After a long conversation, she agrees to continue with pump DC'd this Saturday.  Her concurrent chemo will stop this Saturday. -dietician consult today  - She has last dose radiation next Monday - Lab and follow-up in 2 weeks, plan to start chemotherapy FOLFOX. - Port placement per IR, I spoke with her nephrologist Dr. Tobie and got his permission. - I also spoke with patient's daughter on the phone and answered all her questions.   SUMMARY OF ONCOLOGIC HISTORY: Oncology History  Rectal adenocarcinoma (HCC)  11/22/2024 Cancer Staging   Staging form: Colon and Rectum, AJCC 8th Edition - Clinical stage from 11/22/2024: Stage IIIB (cT3, cN1b, cM0) -  Signed by Lanny Callander, MD on  12/02/2024 Stage prefix: Initial diagnosis Histologic grade (G): G2 Histologic grading system: 4 grade system   11/23/2024 Initial Diagnosis   Rectal adenocarcinoma (HCC)   11/27/2024 -  Chemotherapy   Patient is on Treatment Plan : RECTUM 5FU IVCI D1-5 (225) + XRT        Discussed the use of AI scribe software for clinical note transcription with the patient, who gave verbal consent to proceed.  History of Present Illness Mary Stephens is a 69 year old female with locally advanced rectal cancer undergoing concurrent chemoradiation who presents with persistent severe diarrhea.  She is in the final week of chemoradiation, with radiation started 11/27/2024 and planned to complete 01/07/2025. She has not missed radiation sessions and has two days of chemotherapy remaining via PICC.  She has severe, watery diarrhea triggered by minimal oral intake, including water, and must stay near a bathroom. Loperamide at two tablets once daily provides only partial relief. Lomotil  was prescribed but she has not yet started it. She also has persistent nausea and abdominal pain. Rectal bleeding has improved, but she still has rectal pain felt to be from the tumor.  She has had unintentional weight loss from 135 lbs in June 2025 to 130 lbs currently. She cannot tolerate most foods, often goes long periods without eating, and feels as if food sits in her stomach. She is scheduled to see a dietitian. She reports fatigue and malaise and is worried about rapid weight loss and poor intake.  She received IV fluids last week with symptomatic improvement but has not had IV fluids this week.     All other systems were reviewed with the patient and are negative.  MEDICAL HISTORY:  Past Medical History:  Diagnosis Date   Anxiety    Arthritis    Cataract    forming- very small   Colon polyps    Depression    Diabetes mellitus without complication (HCC)    off meds for diabetes-  diet controlled   GERD (gastroesophageal reflux disease)    H/O aortic dissection 2021   Hepatitis C    Hyperlipidemia    Hypertension    Neuropathy    feet    SURGICAL HISTORY: Past Surgical History:  Procedure Laterality Date   COLONOSCOPY  ?2001,2011?   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 (N/A)  12/02/2020   TEE WITHOUT CARDIOVERSION  12/02/2020   Procedure: TRANSESOPHAGEAL ECHOCARDIOGRAM (TEE);  Surgeon: Shyrl Linnie KIDD, MD;  Location: [facility] OR;  Service: Vascular;;   TRANSESOPHAGEAL ECHOCARDIOGRAM (CATH LAB) N/A 04/17/2024   Procedure: TRANSESOPHAGEAL ECHOCARDIOGRAM;  Surgeon: Shlomo Wilbert SAUNDERS, MD;  Location: [facility] INVASIVE CV LAB;  Service: Cardiovascular;  Laterality: N/A;    I have reviewed the social history and family history with the patient and they are unchanged from previous note.  ALLERGIES:  has no known allergies.  MEDICATIONS:  Current Outpatient Medications  Medication Sig Dispense Refill   acetaminophen  (TYLENOL ) 500 MG tablet Take 500-1,000 mg by mouth every 8 (eight) hours as needed (for pain).     amLODipine  (NORVASC ) 10 MG tablet Take 1 tablet (10 mg total) by mouth daily. 30 tablet 1   [Paused] aspirin  EC 81 MG tablet Take 1  tablet (81 mg total) by mouth daily. Swallow whole. 90 tablet 3   atorvastatin  (LIPITOR) 40 MG tablet Take 40 mg by mouth at bedtime.     diphenoxylate -atropine  (LOMOTIL ) 2.5-0.025 MG tablet Take 1-2 tablets by mouth 4 (four) times daily as needed for diarrhea or loose stools (if immodium not enough for diarrhea). 30 tablet 0   [Paused] furosemide  (LASIX ) 40 MG tablet Take 1 tablet (40 mg total) by mouth daily. 30 tablet 1   gabapentin  (NEURONTIN ) 300 MG capsule Take 300 mg by mouth daily.     hydrALAZINE  (APRESOLINE ) 100 MG tablet Take 100 mg by mouth 3 (three) times daily.     LORazepam  (ATIVAN ) 1 MG tablet Take 1 tablet (1 mg total) by mouth every 4 (four) hours as needed for anxiety. 30 tablet 0   metoprolol  tartrate (LOPRESSOR ) 50 MG tablet Take 50 mg by mouth 2 (two) times daily.     ondansetron  (ZOFRAN -ODT) 4 MG disintegrating tablet Take 1 tablet (4 mg total) by mouth every 8 (eight) hours as needed for nausea or vomiting. 20 tablet 0   oxyCODONE  (OXY IR/ROXICODONE ) 5 MG immediate release tablet Take 1 tablet (5 mg total) by mouth every 6 (six) hours as needed for moderate pain (pain score 4-6). 30 tablet 0   polyethylene glycol powder (GLYCOLAX /MIRALAX ) 17 GM/SCOOP powder Take 17 g by mouth daily as needed for mild constipation. Dissolve 1 capful (17g) in 4-8 ounces of liquid and take by mouth daily. 238 g 0   senna-docusate (SENOKOT-S) 8.6-50 MG tablet Take 1 tablet by mouth 2 (two) times daily. 60 tablet 0   triamcinolone  cream (KENALOG ) 0.1 % Apply 1 Application topically 2 (two) times daily. 454 g 0   No current facility-administered medications for this visit.    PHYSICAL EXAMINATION: ECOG PERFORMANCE STATUS: 2 - Symptomatic, <50% confined to bed  Vitals:   01/03/25 1426  BP: 120/70  Pulse: 71  Temp: 98.3 F (36.8 C)  SpO2: 96%   Wt Readings from Last 3 Encounters:  01/03/25 130 lb 11.2 oz (59.3 kg)  12/31/24 130 lb 12.8 oz (59.3 kg)  12/26/24 127 lb 8 oz (57.8 kg)      GENERAL:alert, no distress and comfortable SKIN: skin color, texture, turgor are normal, no rashes or significant lesions EYES: normal, Conjunctiva are pink and non-injected, sclera clear NECK: supple, thyroid  normal size, non-tender, without nodularity LYMPH:  no palpable lymphadenopathy in the cervical, axillary  LUNGS: clear to auscultation and percussion with normal breathing effort HEART: regular rate & rhythm and no murmurs and no lower extremity edema ABDOMEN:abdomen soft, non-tender and normal bowel sounds Musculoskeletal:no cyanosis of digits and no clubbing  NEURO: alert & oriented x 3 with fluent speech, no focal motor/sensory deficits  Physical Exam MEASUREMENTS: Weight- 130.  LABORATORY DATA:  I have reviewed the data as listed    Latest Ref Rng & Units 12/31/2024   12:47 PM 12/26/2024   10:19 AM 12/17/2024    2:18 PM  CBC  WBC 4.0 - 10.5 K/uL 3.4  2.9  3.4   Hemoglobin 12.0 - 15.0  g/dL 9.5  9.2  9.2   Hematocrit 36.0 - 46.0 % 28.6  27.8  28.1   Platelets 150 - 400 K/uL 180  143  139         Latest Ref Rng & Units 12/31/2024   12:47 PM 12/26/2024   10:19 AM 12/17/2024    2:18 PM  CMP  Glucose 70 - 99 mg/dL 888  881  94   BUN 8 - 23 mg/dL 32  30  19   Creatinine 0.44 - 1.00 mg/dL 6.29  5.99  6.70   Sodium 135 - 145 mmol/L 147  143  145   Potassium 3.5 - 5.1 mmol/L 4.0  3.4  3.9   Chloride 98 - 111 mmol/L 113  109  110   CO2 22 - 32 mmol/L 20  20  23    Calcium  8.9 - 10.3 mg/dL 9.1  8.7  9.2   Total Protein 6.5 - 8.1 g/dL 6.9  6.4  7.0   Total Bilirubin 0.0 - 1.2 mg/dL 0.3  <9.7  0.3   Alkaline Phos 38 - 126 U/L 51  50  55   AST 15 - 41 U/L 17  15  18    ALT 0 - 44 U/L 7  8  7        RADIOGRAPHIC STUDIES: I have personally reviewed the radiological images as listed and agreed with the findings in the report. No results found.    Orders Placed This Encounter  Procedures   IR IMAGING GUIDED PORT INSERTION    Needed before start of new chemotherapy w/in  the next 2 weeks.    Standing Status:   Future    Expected Date:   01/10/2025    Expiration Date:   01/03/2026    Reason for Exam (SYMPTOM  OR DIAGNOSIS REQUIRED):   chemotherapy *LT PORT- Dr. Lanny got clearance from patient's Nephrologist.  He approved for port placement in the left chest .    Preferred Imaging Location?:   [facility]    Call Results- Best Contact Number?:   [phone]   All questions were answered. The patient knows to call the clinic with any problems, questions or concerns. No barriers to learning was detected. The total time spent in the appointment was 40 minutes, including review of chart and various tests results, discussions about plan of care and coordination of care plan     Onita Lanny, MD 01/03/2025     "

## 2025-01-05 NOTE — Progress Notes (Signed)
 Mary Stephens reported to infusion today for IVFs and PICC line removal per MD orders. At 1342 this RN removed Pt's PICC line after sterile site prepped per protocol. PICC catheter tip visualized and intact. This RN noted PICC length to be 41 cm at time of removal. Pressure was applied above site for 5 minutes. An occlusive pressure dressing was placed on site. No redness, ecchymosis, edema, swelling, or drainage noted at site. This RN educated Pt and family on post PICC discharge care, including strict ED precautions. Pt and family provided teach back and verbalized understanding.  Pt observed for 30 minutes in supine position with removal site lower than heart.

## 2025-01-05 NOTE — Patient Instructions (Signed)
 PICC Removal, Adult, Care After The following information offers guidance on how to care for yourself after your procedure. Your health care provider may also give you more specific instructions. If you have problems or questions, contact your health care provider. What can I expect after the procedure? After the procedure, it is common to have: Tenderness or soreness. Redness, swelling, or a scab at the place where your PICC was removed (exit site). Follow these instructions at home: For the first 24 hours after the procedure: Keep the bandage (dressing) on your exit site clean and dry. Do not remove your dressing until your health care provider tells you to do so. Do not lift anything heavy or do activities that require great effort until your health care provider says it is okay. You should avoid: Lifting weights. Doing yard work. Doing any physical activity with repetitive arm movement. Watch closely for any signs of an air bubble in the vein (air embolism). This is a rare but serious complication. Signs of an air embolism include trouble breathing, wheezing, chest pain, or a fast pulse. If you have signs of an air embolism, call 911 right away and lie down on your left side to keep the air from moving into your lungs. After 24 hours have passed:  Remove your dressing as told by your health care provider. Wash your hands with soap and water for at least 20 seconds before and after you change your dressing. If soap and water are not available, use hand sanitizer. Return to your normal activities as told by your health care provider. A small scab may develop over the exit site. Do not pick at the scab. When bathing or showering, gently wash the exit site with soap and water. Pat it dry. Watch for signs of infection, such as: A fever or chills. Swollen glands under your arm. More redness, swelling, or soreness around your arm. Blood, fluid, or pus coming from your exit site. Warmth or a  bad smell coming from your exit site. A red streak spreading away from your exit site. General instructions Take over-the-counter and prescription medicines only as told by your health care provider. Do not take any new medicines without checking with your health care provider first. If you were given an antibiotic ointment, apply it as told by your health care provider. Keep all follow-up visits. This is important. Contact a health care provider if: You have a fever or chills. You have swelling at your exit site or swollen glands under your arm. You have signs of infection at your exit site. You have soreness, redness, or swelling in your arm that gets worse. Get help right away if: You have numbness or tingling in your fingers, hand, or arm. Your arm looks blue and feels cold. You have signs of an air embolism, such as trouble breathing, wheezing, chest pain, or a fast pulse. These symptoms may be an emergency. Get medical help right away. Call 911. Do not wait to see if the symptoms will go away. Do not drive yourself to the hospital. Summary After a PICC is removed, it is common to have tenderness or soreness, redness, swelling, or a scab at the exit site. Keep the bandage (dressing) over the exit site clean and dry. Do not remove the dressing until your health care provider tells you to do so. Do not lift anything heavy or do activities that require great effort until your health care provider says it is okay. Watch closely for any signs  of an air bubble (air embolism). If you have signs of an air embolism, call 911 right away and lie down on your left side. This information is not intended to replace advice given to you by your health care provider. Make sure you discuss any questions you have with your health care provider. Document Revised: 06/17/2021 Document Reviewed: 06/17/2021 Elsevier Patient Education  2024 [employer].

## 2025-01-05 NOTE — Progress Notes (Signed)
 Post PICC removal observation - 30 minutes later, patient feels well, nothing new to report and VS taken. Will be discharged home with support person whom is at bedside. Dressing is intact, was reinforced and no bleeding noted.

## 2025-01-07 ENCOUNTER — Inpatient Hospital Stay: Payer: Medicare (Managed Care)

## 2025-01-07 ENCOUNTER — Ambulatory Visit: Payer: Medicare (Managed Care)

## 2025-01-07 ENCOUNTER — Inpatient Hospital Stay: Payer: Medicare (Managed Care) | Admitting: Hematology

## 2025-01-08 ENCOUNTER — Ambulatory Visit
Admission: RE | Admit: 2025-01-08 | Discharge: 2025-01-08 | Payer: Medicare (Managed Care) | Attending: Radiation Oncology

## 2025-01-08 ENCOUNTER — Other Ambulatory Visit: Payer: Self-pay

## 2025-01-08 LAB — RAD ONC ARIA SESSION SUMMARY
Course Elapsed Days: 42
Plan Fractions Treated to Date: 3
Plan Prescribed Dose Per Fraction: 1.8 Gy
Plan Total Fractions Prescribed: 3
Plan Total Prescribed Dose: 5.4 Gy
Reference Point Dosage Given to Date: 5.4 Gy
Reference Point Session Dosage Given: 1.8 Gy
Session Number: 28

## 2025-01-09 ENCOUNTER — Ambulatory Visit: Payer: Medicare (Managed Care)

## 2025-01-09 ENCOUNTER — Inpatient Hospital Stay: Admission: RE | Admit: 2025-01-09 | Payer: Medicare (Managed Care) | Source: Ambulatory Visit

## 2025-01-09 ENCOUNTER — Ambulatory Visit
Admission: RE | Admit: 2025-01-09 | Discharge: 2025-01-09 | Disposition: A | Payer: Medicare (Managed Care) | Source: Ambulatory Visit | Attending: Nurse Practitioner | Admitting: Nurse Practitioner

## 2025-01-09 DIAGNOSIS — Z1231 Encounter for screening mammogram for malignant neoplasm of breast: Secondary | ICD-10-CM

## 2025-01-09 NOTE — Radiation Completion Notes (Signed)
 Patient Name: Mary Stephens, Mary Stephens MRN: [id number] Date of Birth: 1956-09-03 Referring Physician: RENATO APPLEBAUM, M.D. Date of Service: 2025-01-09 Radiation Oncologist: Norleen Limes, M.D. [facility]                             RADIATION ONCOLOGY END OF TREATMENT NOTE     Diagnosis: C20 Malignant neoplasm of rectum Staging on 2024-11-22: Rectal adenocarcinoma (HCC) T=cT3, N=cN1b, M=cM0 Intent: Curative     ==========DELIVERED PLANS==========  First Treatment Date: 2024-11-27 Last Treatment Date: 2025-01-08   Plan Name: Rectum Site: Rectum Technique: IMRT Mode: Photon Dose Per Fraction: 1.8 Gy Prescribed Dose (Delivered / Prescribed): 45 Gy / 45 Gy Prescribed Fxs (Delivered / Prescribed): 25 / 25   Plan Name: Rectum_Bst Site: Rectum Technique: IMRT Mode: Photon Dose Per Fraction: 1.8 Gy Prescribed Dose (Delivered / Prescribed): 5.4 Gy / 5.4 Gy Prescribed Fxs (Delivered / Prescribed): 3 / 3     ==========ON TREATMENT VISIT DATES========== 2024-11-30, 2024-12-05, 2024-12-12, 2024-12-21, 2024-12-25, 2024-12-28, 2025-01-04     ==========UPCOMING VISITS========== 02/01/2025 [facility] GASTRO OFFICE NEW PATIENT [address] [place], [place]  01/29/2025 [facility] ONCOLOGY EST PT 20 Lanny Callander, MD  01/29/2025 [facility] ONCOLOGY PORT FLUSH W/LAB [facility] FLUSH  01/22/2025 [facility] IV MED 180 [id number]  01/15/2025 [facility] IV MED 180 [facility]  01/14/2025 [facility] DAY CARE PRE PROCEDURE 90 [facility] ROOM  01/14/2025 [facility] RAD IR FLUORO/SHUNT/FIST [facility] 1  01/09/2025 [facility] MAMMOGRAPHY MM BCG SCREENING [facility] MM 2        ==========APPENDIX - ON TREATMENT VISIT NOTES==========   See weekly On Treatment Notes in Epic for details in the Media tab (listed as Progress notes on the On Treatment Visit Dates listed above).

## 2025-01-11 ENCOUNTER — Encounter: Payer: Self-pay | Admitting: Physical Therapy

## 2025-01-11 ENCOUNTER — Other Ambulatory Visit: Payer: Self-pay

## 2025-01-11 ENCOUNTER — Ambulatory Visit: Payer: Medicare (Managed Care) | Attending: Radiation Oncology | Admitting: Physical Therapy

## 2025-01-11 DIAGNOSIS — K6289 Other specified diseases of anus and rectum: Secondary | ICD-10-CM | POA: Insufficient documentation

## 2025-01-11 DIAGNOSIS — C20 Malignant neoplasm of rectum: Secondary | ICD-10-CM | POA: Diagnosis not present

## 2025-01-11 DIAGNOSIS — R2689 Other abnormalities of gait and mobility: Secondary | ICD-10-CM | POA: Insufficient documentation

## 2025-01-11 DIAGNOSIS — M6281 Muscle weakness (generalized): Secondary | ICD-10-CM | POA: Diagnosis present

## 2025-01-11 NOTE — H&P (Shared)
 "   Chief Complaint: rectal adenocarcinoma; referred for port a cath placement  Referring Provider(s): Feng,Yan  Supervising Physician: Vanice Revel  Patient Status: [facility] - Out-pt  History of Present Illness: Mary Stephens is a 69 y.o. 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 She presented to the ED on 11/20/24 with rectal bleeding and received multiple blood transfusions. GI performed flexible sigmoidoscopy on 11/22/24 and revealed an anorectal mass with pathology reports of adenocarcinoma. Patient has been receiving chemoradiation and completed radiation on 1/26. She will continue treatment with IV chemotherapy. Received request for image guided port a catheter placement for chemotherapy administration.   Per documentation, Dr. Lanny received clearance from patient's nephrologist for approval of port a catheter placement in the left chest.  Additional med hx as below  Allergies Reviewed:  Patient has no known allergies.   Patient is Full Code  Past Medical History:  Diagnosis Date   Anxiety    Arthritis    Cataract    forming- very small   Colon polyps    Depression    Diabetes mellitus without complication (HCC)    off meds for diabetes- diet controlled   GERD (gastroesophageal reflux disease)    H/O aortic dissection 2021   Hepatitis C    Hyperlipidemia    Hypertension    Neuropathy    feet    Past Surgical History:  Procedure Laterality Date   COLONOSCOPY  ?2001,2011?   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 (N/A)  12/02/2020   TEE WITHOUT CARDIOVERSION  12/02/2020   Procedure: TRANSESOPHAGEAL ECHOCARDIOGRAM (TEE);  Surgeon: Shyrl Linnie KIDD, MD;  [facility] OR;  Service: Vascular;;   TRANSESOPHAGEAL ECHOCARDIOGRAM (CATH LAB) N/A 04/17/2024   Procedure: TRANSESOPHAGEAL ECHOCARDIOGRAM;  Surgeon: Shlomo Wilbert SAUNDERS, MD;  Location: [facility] INVASIVE CV LAB;  Service: Cardiovascular;  Laterality: N/A;      Medications: Prior to Admission medications  Medication Sig Start Date End Date Taking? Authorizing Provider  acetaminophen  (TYLENOL ) 500 MG tablet Take 500-1,000 mg by mouth every 8 (eight) hours as needed (for pain).    [provider]  amLODipine  (NORVASC ) 10 MG tablet Take 1 tablet (10 mg total) by mouth daily. 04/03/24   Krishnan, Gokul, MD  [Paused] aspirin  EC 81 MG tablet Take 1 tablet (81 mg total) by mouth daily. Swallow whole. Wait to take this until your doctor or other care provider tells you to start again. 05/10/24   Meng, Hao, PA  atorvastatin  (LIPITOR) 40 MG tablet Take 40 mg by mouth at bedtime.    [provider]  diphenoxylate -atropine  (LOMOTIL ) 2.5-0.025 MG tablet Take 1-2 tablets by mouth 4 (four) times daily as needed for diarrhea or loose stools (if immodium not enough for diarrhea). 12/31/24   Lanny Callander, MD  [Paused]  furosemide  (LASIX ) 40 MG tablet Take 1 tablet (40 mg total) by mouth daily. Wait to take this until your doctor or other care provider tells you to start again. 05/02/24 12/31/24  Perri DELENA Meliton Mickey., MD  gabapentin  (NEURONTIN ) 300 MG capsule Take 300 mg by mouth daily.    [provider]  hydrALAZINE  (APRESOLINE ) 100 MG  tablet Take 100 mg by mouth 3 (three) times daily.    [provider]  LORazepam  (ATIVAN ) 1 MG tablet Take 1 tablet (1 mg total) by mouth every 4 (four) hours as needed for anxiety. 12/01/24   Barbarann Nest, MD  metoprolol  tartrate (LOPRESSOR ) 50 MG tablet Take 50 mg by mouth 2 (two) times daily.    [provider]  ondansetron  (ZOFRAN -ODT) 4 MG disintegrating tablet Take 1 tablet (4 mg total) by mouth every 8 (eight) hours as needed for nausea or vomiting. 12/01/24   Barbarann Nest, MD  oxyCODONE  (OXY IR/ROXICODONE ) 5 MG immediate release tablet Take 1 tablet (5 mg total) by mouth every 6 (six) hours as needed for moderate pain (pain score 4-6). 12/01/24   Barbarann Nest, MD  polyethylene glycol powder (GLYCOLAX /MIRALAX ) 17 GM/SCOOP powder Take 17 g by mouth daily as needed for mild constipation. Dissolve 1 capful (17g) in 4-8 ounces of liquid and take by mouth daily. 12/01/24   Barbarann Nest, MD  senna-docusate (SENOKOT-S) 8.6-50 MG tablet Take 1 tablet by mouth 2 (two) times daily. 12/01/24   Barbarann Nest, MD  triamcinolone  cream (KENALOG ) 0.1 % Apply 1 Application topically 2 (two) times daily. 12/14/24   Maritza Stagger, MD  glipiZIDE  (GLUCOTROL  XL) 5 MG 24 hr tablet Take 1 tablet (5 mg total) by mouth daily with breakfast. Patient not taking: Reported on 06/20/2020 01/27/18 06/20/20  Theotis Haze ORN, NP     Family History  Problem Relation Age of Onset   Diabetes Mother    Cancer Sister        Unknonw Cancer type   Colon cancer Neg Hx    Colon polyps Neg Hx    Esophageal cancer Neg Hx    Rectal cancer Neg Hx    Stomach cancer Neg Hx     Social History   Socioeconomic History   Marital status: Single    Spouse name: Not on file   Number of children: 1   Years of education: Not on file   Highest education level: Not on file  Occupational History   Occupation: unemployed  Tobacco Use   Smoking status: Every Day    Current packs/day: 0.25    Types:  Cigarettes   Smokeless tobacco: Never   Tobacco comments:    admits to cuttting back 4 cigarettes/day  Vaping Use   Vaping status: Never Used  Substance and Sexual Activity   Alcohol use: Yes    Alcohol/week: 1.0 standard drink of alcohol    Types: 1 Standard drinks or equivalent per week    Comment: wine cooler   Drug use: Not Currently    Types: Marijuana   Sexual activity: Not on file  Other Topics Concern   Not on file  Social History Narrative   Lives alone.  One daughter.    Social Drivers of Health   Tobacco Use: High Risk (01/11/2025)   Patient History    Smoking Tobacco Use: Every Day    Smokeless Tobacco Use: Never    Passive Exposure: Not on file  Financial Resource Strain: Low Risk  (10/16/2024)   Received from [facility]   Overall Financial Resource Strain (CARDIA)    Difficulty of Paying Living Expenses: Not hard at all  Food Insecurity: Food Insecurity Present (12/25/2024)   Epic    Worried About Programme Researcher, Broadcasting/film/video in the Last Year: Sometimes true    Ran Out of Food in the Last Year: Sometimes true  Transportation Needs: No Transportation Needs (12/31/2024)   Epic    Lack of Transportation (Medical): No    Lack of Transportation (Non-Medical): No  Physical Activity: Not on file  Stress: Not on file  Social Connections: Unknown (11/24/2024)   Social Connection and Isolation Panel    Frequency of Communication with Friends and Family: Not on file    Frequency of Social Gatherings with Friends and Family: Not on file    Attends Religious Services: Not on file    Active Member of Clubs or Organizations: Not on file    Attends Banker Meetings: Not on file    Marital Status: Never married  Depression (PHQ2-9): Low Risk (01/05/2025)   Depression (PHQ2-9)    PHQ-2 Score: 0  Alcohol Screen: Not on file  Housing: High Risk (11/24/2024)   Epic    Unable to Pay for Housing in the Last Year: No    Number of Times Moved in the Last  Year: 2    Homeless in the Last Year: No  Utilities: Not At Risk (12/31/2024)   Epic    Threatened with loss of utilities: No  Health Literacy: Not on file     ROS: denies fever,HA,CP,dyspnea, cough, abd/back pain,N/V or bleeding; she has had diarrhea    Vital Signs:    Advance Care Plan: no documents on file    Physical Exam: awake/alert; chest- CTA bilat; heart- nl rate, occ ectopy noted; abd-soft,+BS,NT; no LE edema  Imaging: No results found.  Labs:  CBC: Recent Labs    12/03/24 1240 12/17/24 1418 12/26/24 1019 12/31/24 1247  WBC 5.1 3.4* 2.9* 3.4*  HGB 8.4* 9.2* 9.2* 9.5*  HCT 25.7* 28.1* 27.8* 28.6*  PLT 213 139* 143* 180    COAGS: No results for input(s): INR, APTT in the last 8760 hours.  BMP: Recent Labs    12/03/24 1240 12/17/24 1418 12/26/24 1019 12/31/24 1247  NA 144 145 143 147*  K 4.0 3.9 3.4* 4.0  CL 110 110 109 113*  CO2 22 23 20* 20*  GLUCOSE 115* 94 118* 111*  BUN 35* 19 30* 32*  CALCIUM  8.4* 9.2 8.7* 9.1  CREATININE 3.75* 3.29* 4.00* 3.70*  GFRNONAA 12* 14* 11* 13*    LIVER FUNCTION TESTS: Recent Labs    12/03/24 1240 12/17/24 1418 12/26/24 1019 12/31/24 1247  BILITOT 0.3 0.3 <0.2 0.3  AST 17 18 15 17   ALT 7 7 8 7   ALKPHOS 51 55 50 51  PROT 6.1* 7.0 6.4* 6.9  ALBUMIN  4.1 4.4 4.0 4.3    TUMOR MARKERS: No results for input(s): AFPTM, CEA, CA199, CHROMGRNA in the last 8760 hours.  Assessment and Plan: Rectal adenocarcinoma-  Request for image guided port a catheter placement.  No contraindications for procedure identified in ROS, physical exam, or review of pre-sedation considerations.  Risks and benefits of image guided port-a-catheter placement was discussed with the patient/daughter including, but not limited to bleeding, infection, pneumothorax, or fibrin sheath development and need for additional procedures.  All of the patient's questions were answered, patient is agreeable to proceed. Consent  signed and in chart.  Thank you for allowing our service to participate in JOELIE SCHOU 's care.    Electronically Signed: Kristi B Davenport, NP Gabino Kelsie RIGGERS  01/11/2025, 3:06 PM     I spent a total of 20 minutes in face to face in clinical consultation, greater than 50% of which was counseling/coordinating care for rectal adenocarcinoma.  (A copy of this note was sent to the referring provider and the time of visit.) "

## 2025-01-12 ENCOUNTER — Inpatient Hospital Stay: Payer: Medicare (Managed Care)

## 2025-01-13 ENCOUNTER — Other Ambulatory Visit: Payer: Self-pay

## 2025-01-14 ENCOUNTER — Ambulatory Visit (HOSPITAL_COMMUNITY)
Admission: RE | Admit: 2025-01-14 | Discharge: 2025-01-14 | Disposition: A | Discharge status: Home / Self Care | Payer: Medicare (Managed Care) | Source: Ambulatory Visit | Attending: Hematology | Admitting: Hematology

## 2025-01-14 ENCOUNTER — Ambulatory Visit (HOSPITAL_COMMUNITY)
Admission: RE | Admit: 2025-01-14 | Discharge: 2025-01-14 | Disposition: A | Payer: Medicare (Managed Care) | Source: Ambulatory Visit | Attending: Hematology

## 2025-01-14 ENCOUNTER — Encounter (HOSPITAL_COMMUNITY): Payer: Self-pay

## 2025-01-14 ENCOUNTER — Other Ambulatory Visit: Payer: Self-pay

## 2025-01-14 ENCOUNTER — Inpatient Hospital Stay: Payer: Medicare (Managed Care)

## 2025-01-14 ENCOUNTER — Ambulatory Visit: Payer: Medicare (Managed Care) | Admitting: Gastroenterology

## 2025-01-14 DIAGNOSIS — Z9221 Personal history of antineoplastic chemotherapy: Secondary | ICD-10-CM | POA: Insufficient documentation

## 2025-01-14 DIAGNOSIS — C20 Malignant neoplasm of rectum: Secondary | ICD-10-CM | POA: Insufficient documentation

## 2025-01-14 DIAGNOSIS — F17210 Nicotine dependence, cigarettes, uncomplicated: Secondary | ICD-10-CM | POA: Insufficient documentation

## 2025-01-14 DIAGNOSIS — Z923 Personal history of irradiation: Secondary | ICD-10-CM | POA: Insufficient documentation

## 2025-01-14 DIAGNOSIS — Z79899 Other long term (current) drug therapy: Secondary | ICD-10-CM | POA: Insufficient documentation

## 2025-01-14 MED ORDER — LIDOCAINE-EPINEPHRINE 1 %-1:100000 IJ SOLN
20.0000 mL | Freq: Once | INTRAMUSCULAR | Status: AC
  Administered 2025-01-14: 20 mL via INTRADERMAL

## 2025-01-14 MED ORDER — SODIUM CHLORIDE 0.9 % IV SOLN: INTRAVENOUS | Status: DC

## 2025-01-14 MED ORDER — FENTANYL CITRATE (PF) 100 MCG/2ML IJ SOLN
INTRAMUSCULAR | Status: AC | PRN
  Administered 2025-01-14: 50 ug via INTRAVENOUS

## 2025-01-14 MED ORDER — DIPHENHYDRAMINE HCL 50 MG/ML IJ SOLN
INTRAMUSCULAR | Status: AC
  Filled 2025-01-14: qty 1

## 2025-01-14 MED ORDER — HEPARIN SOD (PORK) LOCK FLUSH 100 UNIT/ML IV SOLN
INTRAVENOUS | Status: AC
  Filled 2025-01-14: qty 5

## 2025-01-14 MED ORDER — MIDAZOLAM HCL (PF) 2 MG/2ML IJ SOLN
INTRAMUSCULAR | Status: AC | PRN
  Administered 2025-01-14: 1 mg via INTRAVENOUS

## 2025-01-14 MED ORDER — MIDAZOLAM HCL 2 MG/2ML IJ SOLN
INTRAMUSCULAR | Status: AC
  Filled 2025-01-14: qty 2

## 2025-01-14 MED ORDER — FENTANYL CITRATE (PF) 100 MCG/2ML IJ SOLN
INTRAMUSCULAR | Status: AC
  Filled 2025-01-14: qty 2

## 2025-01-14 MED ORDER — LIDOCAINE-EPINEPHRINE 1 %-1:100000 IJ SOLN
INTRAMUSCULAR | Status: AC
  Filled 2025-01-14: qty 20

## 2025-01-14 MED ORDER — HEPARIN SOD (PORK) LOCK FLUSH 100 UNIT/ML IV SOLN
500.0000 [IU] | Freq: Once | INTRAVENOUS | Status: AC
  Administered 2025-01-14: 500 [IU] via INTRAVENOUS

## 2025-01-14 NOTE — Discharge Instructions (Addendum)
 Discharge Instructions:   Please call Interventional Radiology clinic [phone] with any questions or concerns.  You may remove your dressing and shower tomorrow. Do not use EMLA / Lidocaine  cream for 2 weeks post Port Insertion this will remove the surgical glue from the incision site.    Moderate Conscious Sedation, Adult, Care After  This sheet gives you information about how to care for yourself after your procedure. Your health care provider may also give you more specific instructions. If you have problems or questions, contact your health care provider. What can I expect after the procedure? After the procedure, it is common to have: Sleepiness for several hours. Impaired judgment for several hours. Difficulty with balance. Vomiting if you eat too soon. Follow these instructions at home: For the time period you were told by your health care provider: Rest. Do not participate in activities where you could fall or become injured. Do not drive or use machinery. Do not drink alcohol. Do not take sleeping pills or medicines that cause drowsiness. Do not make important decisions or sign legal documents. Do not take care of children on your own. Eating and drinking  Follow the diet recommended by your health care provider. Drink enough fluid to keep your urine pale yellow. If you vomit: Drink water, juice, or soup when you can drink without vomiting. Make sure you have little or no nausea before eating solid foods. General instructions Take over-the-counter and prescription medicines only as told by your health care provider. Have a responsible adult stay with you for the time you are told. It is important to have someone help care for you until you are awake and alert. Do not smoke. Keep all follow-up visits as told by your health care provider. This is important. Contact a health care provider if: You are still sleepy or having trouble with balance after 24 hours. You  feel light-headed. You keep feeling nauseous or you keep vomiting. You develop a rash. You have a fever. You have redness or swelling around the IV site. Get help right away if: You have trouble breathing. You have new-onset confusion at home. Summary After the procedure, it is common to feel sleepy, have impaired judgment, or feel nauseous if you eat too soon. Rest after you get home. Know the things you should not do after the procedure. Follow the diet recommended by your health care provider and drink enough fluid to keep your urine pale yellow. Get help right away if you have trouble breathing or new-onset confusion at home. This information is not intended to replace advice given to you by your health care provider. Make sure you discuss any questions you have with your health care provider. Document Revised: 03/28/2020 Document Reviewed: 10/25/2019 Elsevier Patient Education  2023 Elsevier Inc.   Implanted [place] Insertion, Care After  The following information offers guidance on how to care for yourself after your procedure. Your health care provider may also give you more specific instructions. If you have problems or questions, contact your health care provider. What can I expect after the procedure? After the procedure, it is common to have: Discomfort at the port insertion site. Bruising on the skin over the port. This should improve over 3-4 days. Follow these instructions at home: [facility] care After your port is placed, you will get a manufacturer's information card. The card has information about your port. Keep this card with you at all times. Take care of the port as told by your health care provider.  Ask your health care provider if you or a family member can get training for taking care of the port at home. A home health care nurse will be be available to help care for the port. Make sure to remember what type of port you have. Incision care     Follow instructions  from your health care provider about how to take care of your port insertion site. Make sure you: Wash your hands with soap and water for at least 20 seconds before and after you change your bandage (dressing). If soap and water are not available, use hand sanitizer. Change your dressing as told by your health care provider. Leave stitches (sutures), skin glue, or adhesive strips in place. These skin closures may need to stay in place for 2 weeks or longer. If adhesive strip edges start to loosen and curl up, you may trim the loose edges. Do not remove adhesive strips completely unless your health care provider tells you to do that. Check your port insertion site every day for signs of infection. Check for: Redness, swelling, or pain. Fluid or blood. Warmth. Pus or a bad smell. Activity Return to your normal activities as told by your health care provider. Ask your health care provider what activities are safe for you. You may have to avoid lifting. Ask your health care provider how much you can safely lift. General instructions Take over-the-counter and prescription medicines only as told by your health care provider. Do not take baths, swim, or use a hot tub until your health care provider approves. Ask your health care provider if you may take showers. You may only be allowed to take sponge baths. If you were given a sedative during the procedure, it can affect you for several hours. Do not drive or operate machinery until your health care provider says that it is safe. Wear a medical alert bracelet in case of an emergency. This will tell any health care providers that you have a port. Keep all follow-up visits. This is important. Contact a health care provider if: You cannot flush your port with saline as directed, or you cannot draw blood from the port. You have a fever or chills. You have redness, swelling, or pain around your port insertion site. You have fluid or blood coming from your  port insertion site. Your port insertion site feels warm to the touch. You have pus or a bad smell coming from the port insertion site. Get help right away if: You have chest pain or shortness of breath. You have bleeding from your port that you cannot control. These symptoms may be an emergency. Get help right away. Call 911. Do not wait to see if the symptoms will go away. Do not drive yourself to the hospital. Summary Take care of the port as told by your health care provider. Keep the manufacturer's information card with you at all times. Change your dressing as told by your health care provider. Contact a health care provider if you have a fever or chills or if you have redness, swelling, or pain around your port insertion site. Keep all follow-up visits. This information is not intended to replace advice given to you by your health care provider. Make sure you discuss any questions you have with your health care provider. Document Revised: 06/02/2021 Document Reviewed: 06/02/2021 Elsevier Patient Education  2023 [employer].

## 2025-01-14 NOTE — Procedures (Signed)
Interventional Radiology Procedure Note  Procedure: RT internal jugular POWER PORT    Complications: None  Estimated Blood Loss:  MIN  Findings: TIP SVCRA    M. TREVOR Levaeh Vice, MD    

## 2025-01-15 ENCOUNTER — Other Ambulatory Visit: Payer: Self-pay | Admitting: Nurse Practitioner

## 2025-01-15 ENCOUNTER — Inpatient Hospital Stay (HOSPITAL_COMMUNITY)
Admission: RE | Admit: 2025-01-15 | Discharge: 2025-01-15 | Disposition: A | Payer: Medicare (Managed Care) | Source: Ambulatory Visit | Attending: Internal Medicine

## 2025-01-15 VITALS — BP 132/80 | HR 67 | Temp 97.5°F | Resp 14

## 2025-01-15 DIAGNOSIS — D509 Iron deficiency anemia, unspecified: Secondary | ICD-10-CM

## 2025-01-15 DIAGNOSIS — R928 Other abnormal and inconclusive findings on diagnostic imaging of breast: Secondary | ICD-10-CM

## 2025-01-15 DIAGNOSIS — D649 Anemia, unspecified: Secondary | ICD-10-CM

## 2025-01-15 DIAGNOSIS — D631 Anemia in chronic kidney disease: Secondary | ICD-10-CM

## 2025-01-15 MED ORDER — IRON SUCROSE 300 MG IVPB - SIMPLE MED
300.0000 mg | Freq: Once | Status: AC
  Administered 2025-01-15: 300 mg via INTRAVENOUS

## 2025-01-16 ENCOUNTER — Inpatient Hospital Stay: Payer: Medicare (Managed Care) | Admitting: Hematology

## 2025-01-16 ENCOUNTER — Inpatient Hospital Stay: Payer: Medicare (Managed Care)

## 2025-01-21 ENCOUNTER — Inpatient Hospital Stay: Payer: Medicare (Managed Care)

## 2025-01-22 ENCOUNTER — Encounter (HOSPITAL_COMMUNITY): Payer: Medicare (Managed Care)

## 2025-01-23 ENCOUNTER — Ambulatory Visit: Payer: Medicare (Managed Care) | Admitting: Physical Therapy

## 2025-01-25 ENCOUNTER — Ambulatory Visit: Payer: Medicare (Managed Care) | Admitting: Physical Therapy

## 2025-01-28 ENCOUNTER — Inpatient Hospital Stay: Payer: Medicare (Managed Care)

## 2025-01-28 ENCOUNTER — Ambulatory Visit: Payer: Medicare (Managed Care) | Admitting: Physical Therapy

## 2025-01-29 ENCOUNTER — Inpatient Hospital Stay: Payer: Medicare (Managed Care)

## 2025-01-29 ENCOUNTER — Inpatient Hospital Stay: Payer: Medicare (Managed Care) | Admitting: Hematology

## 2025-01-30 ENCOUNTER — Ambulatory Visit: Payer: Medicare (Managed Care) | Admitting: Physical Therapy

## 2025-01-31 ENCOUNTER — Other Ambulatory Visit: Payer: Medicare (Managed Care)

## 2025-02-01 ENCOUNTER — Ambulatory Visit: Payer: Medicare (Managed Care) | Admitting: Gastroenterology

## 2025-02-04 ENCOUNTER — Inpatient Hospital Stay: Payer: Medicare (Managed Care)

## 2025-02-04 ENCOUNTER — Ambulatory Visit: Payer: Medicare (Managed Care) | Admitting: Physical Therapy

## 2025-02-06 ENCOUNTER — Ambulatory Visit: Payer: Medicare (Managed Care) | Admitting: Physical Therapy

## 2025-02-11 ENCOUNTER — Ambulatory Visit: Payer: Medicare (Managed Care)

## 2025-02-11 ENCOUNTER — Ambulatory Visit: Payer: Medicare (Managed Care) | Admitting: Physical Therapy

## 2025-02-13 ENCOUNTER — Ambulatory Visit: Payer: Medicare (Managed Care) | Admitting: Physical Therapy

## 2025-02-18 ENCOUNTER — Ambulatory Visit: Payer: Medicare (Managed Care) | Admitting: Physical Therapy

## 2025-02-20 ENCOUNTER — Ambulatory Visit: Payer: Medicare (Managed Care) | Admitting: Physical Therapy
